# Patient Record
Sex: Male | Born: 1965 | Race: White | Hispanic: No | Smoking: Former smoker
Health system: Southern US, Community
[De-identification: ages and names within clinical notes are randomized; demographics above are authoritative.]

## PROBLEM LIST (undated history)

## (undated) DIAGNOSIS — K7031 Alcoholic cirrhosis of liver with ascites: Secondary | ICD-10-CM

## (undated) DIAGNOSIS — E1140 Type 2 diabetes mellitus with diabetic neuropathy, unspecified: Secondary | ICD-10-CM

## (undated) DIAGNOSIS — K219 Gastro-esophageal reflux disease without esophagitis: Secondary | ICD-10-CM

## (undated) DIAGNOSIS — F1910 Other psychoactive substance abuse, uncomplicated: Secondary | ICD-10-CM

## (undated) DIAGNOSIS — F329 Major depressive disorder, single episode, unspecified: Secondary | ICD-10-CM

## (undated) DIAGNOSIS — K7030 Alcoholic cirrhosis of liver without ascites: Secondary | ICD-10-CM

## (undated) DIAGNOSIS — K859 Acute pancreatitis without necrosis or infection, unspecified: Secondary | ICD-10-CM

## (undated) DIAGNOSIS — F419 Anxiety disorder, unspecified: Secondary | ICD-10-CM

## (undated) DIAGNOSIS — C182 Malignant neoplasm of ascending colon: Secondary | ICD-10-CM

## (undated) DIAGNOSIS — E119 Type 2 diabetes mellitus without complications: Secondary | ICD-10-CM

## (undated) DIAGNOSIS — M109 Gout, unspecified: Secondary | ICD-10-CM

## (undated) DIAGNOSIS — E785 Hyperlipidemia, unspecified: Secondary | ICD-10-CM

## (undated) DIAGNOSIS — I10 Essential (primary) hypertension: Secondary | ICD-10-CM

## (undated) DIAGNOSIS — F32A Depression, unspecified: Secondary | ICD-10-CM

## (undated) HISTORY — DX: Gout, unspecified: M10.9

## (undated) HISTORY — DX: Anxiety disorder, unspecified: F41.9

## (undated) HISTORY — DX: Type 2 diabetes mellitus with diabetic neuropathy, unspecified: E11.40

## (undated) HISTORY — DX: Essential (primary) hypertension: I10

## (undated) HISTORY — DX: Type 2 diabetes mellitus without complications: E11.9

## (undated) HISTORY — PX: INGUINAL HERNIA REPAIR: SUR1180

## (undated) HISTORY — PX: HERNIA REPAIR: SHX51

## (undated) HISTORY — DX: Alcoholic cirrhosis of liver with ascites: K70.31

## (undated) HISTORY — DX: Hyperlipidemia, unspecified: E78.5

## (undated) HISTORY — DX: Gastro-esophageal reflux disease without esophagitis: K21.9

## (undated) HISTORY — DX: Malignant neoplasm of ascending colon: C18.2

## (undated) HISTORY — DX: Alcoholic cirrhosis of liver without ascites: K70.30

## (undated) HISTORY — DX: Major depressive disorder, single episode, unspecified: F32.9

## (undated) HISTORY — DX: Depression, unspecified: F32.A

## (undated) HISTORY — DX: Other psychoactive substance abuse, uncomplicated: F19.10

---

## 2005-03-18 ENCOUNTER — Ambulatory Visit: Payer: Self-pay | Admitting: Family Medicine

## 2006-07-13 ENCOUNTER — Ambulatory Visit: Payer: Self-pay | Admitting: Family Medicine

## 2007-05-20 ENCOUNTER — Ambulatory Visit: Payer: Self-pay | Admitting: Family Medicine

## 2007-05-20 DIAGNOSIS — F418 Other specified anxiety disorders: Secondary | ICD-10-CM

## 2007-05-20 DIAGNOSIS — K219 Gastro-esophageal reflux disease without esophagitis: Secondary | ICD-10-CM

## 2007-05-20 DIAGNOSIS — E785 Hyperlipidemia, unspecified: Secondary | ICD-10-CM

## 2007-05-20 DIAGNOSIS — I10 Essential (primary) hypertension: Secondary | ICD-10-CM | POA: Insufficient documentation

## 2008-07-17 ENCOUNTER — Ambulatory Visit: Payer: Self-pay | Admitting: Family Medicine

## 2008-07-23 LAB — CONVERTED CEMR LAB
ALT: 38 units/L (ref 0–53)
Albumin: 4 g/dL (ref 3.5–5.2)
BUN: 12 mg/dL (ref 6–23)
Basophils Absolute: 0 10*3/uL (ref 0.0–0.1)
Basophils Relative: 0.7 % (ref 0.0–3.0)
CO2: 28 meq/L (ref 19–32)
Calcium: 9.4 mg/dL (ref 8.4–10.5)
Creatinine, Ser: 1.1 mg/dL (ref 0.4–1.5)
Direct LDL: 109.2 mg/dL
Eosinophils Absolute: 0.1 10*3/uL (ref 0.0–0.7)
Hemoglobin: 16.5 g/dL (ref 13.0–17.0)
MCHC: 34.9 g/dL (ref 30.0–36.0)
MCV: 91.7 fL (ref 78.0–100.0)
Neutro Abs: 3 10*3/uL (ref 1.4–7.7)
RBC: 5.15 M/uL (ref 4.22–5.81)
TSH: 1.55 microintl units/mL (ref 0.35–5.50)
Total Bilirubin: 1 mg/dL (ref 0.3–1.2)

## 2009-07-09 ENCOUNTER — Ambulatory Visit: Payer: Self-pay | Admitting: Family Medicine

## 2009-07-12 ENCOUNTER — Ambulatory Visit: Payer: Self-pay | Admitting: Family Medicine

## 2009-07-16 ENCOUNTER — Ambulatory Visit: Payer: Self-pay | Admitting: Family Medicine

## 2009-07-30 ENCOUNTER — Ambulatory Visit: Payer: Self-pay | Admitting: Family Medicine

## 2009-08-26 ENCOUNTER — Telehealth: Payer: Self-pay | Admitting: Family Medicine

## 2010-05-14 ENCOUNTER — Telehealth: Payer: Self-pay | Admitting: Family Medicine

## 2010-07-15 NOTE — Assessment & Plan Note (Signed)
Summary: 2 week fup//ccm   Vital Signs:  Patient profile:   45 year old male Temp:     98.1 degrees F oral BP sitting:   132 / 88  (left arm) Cuff size:   large  Vitals Entered By: Alfred Levins, CMA (July 30, 2009 1:16 PM) CC: f/u   History of Present Illness: Here to follow up on anxiety and depression. he is doing very well now, and he is ready to return to work on 08-05-09 as we had planned. he is talking with his HR department, and they are satisfied. He has been meeting with a therapist as well. he would like to go back down on the dose of Zoloft since it makes him feel a little "like a zombie".   Current Medications (verified): 1)  Bisoprolol-Hydrochlorothiazide 2.5-6.25 Mg  Tabs (Bisoprolol-Hydrochlorothiazide) .... Once Daily 2)  Zoloft 100 Mg Tabs (Sertraline Hcl) .... Once Daily 3)  Alprazolam 1 Mg Tabs (Alprazolam) .... Three Times A Day As Needed Anxiety  Allergies (verified): No Known Drug Allergies  Past History:  Past Medical History: Reviewed history from 05/20/2007 and no changes required. Depression Hyperlipidemia Hypertension GERD  Review of Systems  The patient denies anorexia, fever, weight loss, weight gain, vision loss, decreased hearing, hoarseness, chest pain, syncope, dyspnea on exertion, peripheral edema, prolonged cough, headaches, hemoptysis, abdominal pain, melena, hematochezia, severe indigestion/heartburn, hematuria, incontinence, genital sores, muscle weakness, suspicious skin lesions, transient blindness, difficulty walking, depression, unusual weight change, abnormal bleeding, enlarged lymph nodes, angioedema, breast masses, and testicular masses.    Physical Exam  General:  Well-developed,well-nourished,in no acute distress; alert,appropriate and cooperative throughout examination Psych:  Cognition and judgment appear intact. Alert and cooperative with normal attention span and concentration. No apparent delusions, illusions,  hallucinations   Impression & Recommendations:  Problem # 1:  ANXIETY STATE, UNSPECIFIED (ICD-300.00)  His updated medication list for this problem includes:    Zoloft 100 Mg Tabs (Sertraline hcl) ..... Once daily    Alprazolam 1 Mg Tabs (Alprazolam) .Marland Kitchen... Three times a day as needed anxiety  Problem # 2:  DEPRESSION (ICD-311)  His updated medication list for this problem includes:    Zoloft 100 Mg Tabs (Sertraline hcl) ..... Once daily    Alprazolam 1 Mg Tabs (Alprazolam) .Marland Kitchen... Three times a day as needed anxiety  Complete Medication List: 1)  Bisoprolol-hydrochlorothiazide 2.5-6.25 Mg Tabs (Bisoprolol-hydrochlorothiazide) .... Once daily 2)  Zoloft 100 Mg Tabs (Sertraline hcl) .... Once daily 3)  Alprazolam 1 Mg Tabs (Alprazolam) .... Three times a day as needed anxiety  Patient Instructions: 1)  Decrease Zoloft back to 50mg  a day. return to work as above.  2)  Please schedule a follow-up appointment in 3 months .  Prescriptions: BISOPROLOL-HYDROCHLOROTHIAZIDE 2.5-6.25 MG  TABS (BISOPROLOL-HYDROCHLOROTHIAZIDE) once daily  #30 x 11   Entered and Authorized by:   Nelwyn Salisbury MD   Signed by:   Nelwyn Salisbury MD on 07/30/2009   Method used:   Electronically to        [id number] (retail)       [address]       [place], [place]  [address]       Ph: [phone] or [phone]       Fax: [phone]   RxID:   [id number]

## 2010-07-15 NOTE — Progress Notes (Signed)
Summary: rx alprazolam  Phone Note From Pharmacy   Caller: [id number] Summary of Call: refill alprazolam 1 mg  Initial call taken by: Pura Spice, RN,  May 14, 2010 5:15 PM  Follow-up for Phone Call        call in #60 with 5 rf Follow-up by: Nelwyn Salisbury MD,  May 15, 2010 9:45 AM  Additional Follow-up for Phone Call Additional follow up Details #1::        done Additional Follow-up by: Pura Spice, RN,  May 15, 2010 10:27 AM    Prescriptions: ALPRAZOLAM 1 MG TABS (ALPRAZOLAM) three times a day as needed anxiety  #60 x 6   Entered by:   Pura Spice, RN   Authorized by:   Nelwyn Salisbury MD   Signed by:   Pura Spice, RN on 05/15/2010   Method used:   Telephoned to ...       [id number] (retail)       [address]       [place], [place]  [address]       Ph: [phone] or [phone]       Fax: [phone]   RxID:   [id number]

## 2010-07-15 NOTE — Progress Notes (Signed)
Summary: refill Alprazolam  Phone Note From Pharmacy Message from:  Pharmacy on August 26, 2009 11:36 AM  Refills Requested: Medication #1:  ALPRAZOLAM 1 MG TABS three times a day as needed anxiety.   Dosage confirmed as above?Dosage Confirmed   Supply Requested: 3 months   Last Refilled: 07/09/2009  Method Requested: Telephone to Pharmacy Caller: [id number] Summary of Call: call in #60 with 5 rf Initial call taken by: Nelwyn Salisbury MD,  August 26, 2009 2:48 PM    Prescriptions: ALPRAZOLAM 1 MG TABS (ALPRAZOLAM) three times a day as needed anxiety  #60 x 5   Entered by:   Raechel Ache, RN   Authorized by:   Nelwyn Salisbury MD   Signed by:   Raechel Ache, RN on 08/26/2009   Method used:   Historical   RxID:   [id number]

## 2010-07-15 NOTE — Assessment & Plan Note (Signed)
Summary: med check/depression/cjr   Vital Signs:  Patient profile:   45 year old male Height:      75 inches Weight:      215 pounds BMI:     26.97 Temp:     98.1 degrees F oral Pulse rate:   103 / minute BP sitting:   154 / 94  (left arm) Cuff size:   large  Vitals Entered By: Alfred Levins, CMA (July 09, 2009 1:24 PM) CC: depression   History of Present Illness: Here asking for help with a very difficult situation. After his divorce last year, he developed a relationship with a girl he met through work in [place], and they became engaged. Then he learned last week that she has been having an affair with Tayvon's immediate boss here in [place]. It happens that this man is also a good friend of Boden's. Of course he has been very upset over this situation, but one bad part of it is he does not want to go back to work to be in the same office as his boss. he has been out of work at home ever since. he is depressed, angry, anxious, etc. His appetite is down but he is eating a little food. It is hard to sleep. he has had some brief thoughts about violence towards this boss, but he has no plans for this and wants to stay away from the office for awhile to let him cool down first. he admis to some brief suicidal thoughts also, but says he has no plans for this and does not want to harm himself or anyone else. He needs me to put him out of work for awhile until he can sort this all out.   Current Medications (verified): 1)  Bisoprolol-Hydrochlorothiazide 2.5-6.25 Mg  Tabs (Bisoprolol-Hydrochlorothiazide) .... Once Daily 2)  Sertraline Hcl 50 Mg Tabs (Sertraline Hcl) .... Once Daily  Allergies (verified): No Known Drug Allergies  Past History:  Past Medical History: Reviewed history from 05/20/2007 and no changes required. Depression Hyperlipidemia Hypertension GERD  Social History: Reviewed history from 05/20/2007 and no changes required. Former Smoker Alcohol use-yes Divorced,  has visitation rights with his 73 yr old son Occupation: in Nurse, learning disability for [employer] Occupation:  employed  Review of Systems  The patient denies anorexia, fever, weight loss, weight gain, vision loss, decreased hearing, hoarseness, chest pain, syncope, dyspnea on exertion, peripheral edema, prolonged cough, headaches, hemoptysis, abdominal pain, melena, hematochezia, severe indigestion/heartburn, hematuria, incontinence, genital sores, muscle weakness, suspicious skin lesions, transient blindness, difficulty walking, unusual weight change, abnormal bleeding, enlarged lymph nodes, angioedema, breast masses, and testicular masses.    Physical Exam  General:  Well-developed,well-nourished,in no acute distress; alert,appropriate and cooperative throughout examination Psych:  Oriented X3, memory intact for recent and remote, normally interactive, good eye contact, tearful, and moderately anxious.     Impression & Recommendations:  Problem # 1:  ANXIETY STATE, UNSPECIFIED (ICD-300.00)  The following medications were removed from the medication list:    Sertraline Hcl 50 Mg Tabs (Sertraline hcl) ..... Once daily His updated medication list for this problem includes:    Zoloft 100 Mg Tabs (Sertraline hcl) ..... Once daily    Alprazolam 1 Mg Tabs (Alprazolam) .Marland Kitchen... Three times a day as needed anxiety  Problem # 2:  DEPRESSION (ICD-311)  The following medications were removed from the medication list:    Sertraline Hcl 50 Mg Tabs (Sertraline hcl) ..... Once daily His updated medication list for this  problem includes:    Zoloft 100 Mg Tabs (Sertraline hcl) ..... Once daily    Alprazolam 1 Mg Tabs (Alprazolam) .Marland Kitchen... Three times a day as needed anxiety  Complete Medication List: 1)  Bisoprolol-hydrochlorothiazide 2.5-6.25 Mg Tabs (Bisoprolol-hydrochlorothiazide) .... Once daily 2)  Zoloft 100 Mg Tabs (Sertraline hcl) .... Once daily 3)  Alprazolam 1 Mg Tabs (Alprazolam)  .... Three times a day as needed anxiety  Patient Instructions: 1)  First we will put him out of work for one month to start with. His last day at work was 06-28-09, so we will put him out from 07-01-09 until 08-05-09. We will increase his Zoloft to 100mg  a day and add Xanax that he can use as needed . Will refer him to Judithe Modest MSW for psychotherapy. I will see him back in one week. He knows to ask for help or call 911 if he gets any more sustained violent thoughts. His cell # is [phone]. Prescriptions: ALPRAZOLAM 1 MG TABS (ALPRAZOLAM) three times a day as needed anxiety  #60 x 0   Entered and Authorized by:   Nelwyn Salisbury MD   Signed by:   Nelwyn Salisbury MD on 07/09/2009   Method used:   Print then Give to Patient   RxID:   [id number] ZOLOFT 100 MG TABS (SERTRALINE HCL) once daily  #30 x 5   Entered and Authorized by:   Nelwyn Salisbury MD   Signed by:   Nelwyn Salisbury MD on 07/09/2009   Method used:   Print then Give to Patient   RxID:   [id number]

## 2010-07-15 NOTE — Assessment & Plan Note (Signed)
Summary: 1 week fup//ccm   Vital Signs:  Patient profile:   45 year old male Weight:      208 pounds Temp:     98.3 degrees F oral Pulse rate:   77 / minute BP sitting:   134 / 82  (left arm) Cuff size:   large  Vitals Entered By: Alfred Levins, CMA (July 16, 2009 1:23 PM) CC: 1 wk f/u   History of Present Illness: Here to follow up situational anxiety and depression from one week ago. He has been out of work per our plan, and he is taking the medications. He feels much better than before, less tearfulness, less hopelessness. He is more relaxed and has been able to sleep fairly well by using a Xanax at bedtime . He has not been able to start therapy yet but is still interested. He no longer has any suicidal or homocidal thoughts at all.   Current Medications (verified): 1)  Bisoprolol-Hydrochlorothiazide 2.5-6.25 Mg  Tabs (Bisoprolol-Hydrochlorothiazide) .... Once Daily 2)  Zoloft 100 Mg Tabs (Sertraline Hcl) .... Once Daily 3)  Alprazolam 1 Mg Tabs (Alprazolam) .... Three Times A Day As Needed Anxiety  Allergies (verified): No Known Drug Allergies  Past History:  Past Medical History: Reviewed history from 05/20/2007 and no changes required. Depression Hyperlipidemia Hypertension GERD  Review of Systems  The patient denies anorexia, fever, weight loss, weight gain, vision loss, decreased hearing, hoarseness, chest pain, syncope, dyspnea on exertion, peripheral edema, prolonged cough, headaches, hemoptysis, abdominal pain, melena, hematochezia, severe indigestion/heartburn, hematuria, incontinence, genital sores, muscle weakness, suspicious skin lesions, transient blindness, difficulty walking, unusual weight change, abnormal bleeding, enlarged lymph nodes, angioedema, breast masses, and testicular masses.    Physical Exam  General:  Well-developed,well-nourished,in no acute distress; alert,appropriate and cooperative throughout examination Psych:  Oriented X3, memory  intact for recent and remote, normally interactive, good eye contact, and slightly anxious.  He appears to be much better than last time   Impression & Recommendations:  Problem # 1:  ANXIETY STATE, UNSPECIFIED (ICD-300.00)  His updated medication list for this problem includes:    Zoloft 100 Mg Tabs (Sertraline hcl) ..... Once daily    Alprazolam 1 Mg Tabs (Alprazolam) .Marland Kitchen... Three times a day as needed anxiety  Problem # 2:  DEPRESSION (ICD-311)  His updated medication list for this problem includes:    Zoloft 100 Mg Tabs (Sertraline hcl) ..... Once daily    Alprazolam 1 Mg Tabs (Alprazolam) .Marland Kitchen... Three times a day as needed anxiety  Complete Medication List: 1)  Bisoprolol-hydrochlorothiazide 2.5-6.25 Mg Tabs (Bisoprolol-hydrochlorothiazide) .... Once daily 2)  Zoloft 100 Mg Tabs (Sertraline hcl) .... Once daily 3)  Alprazolam 1 Mg Tabs (Alprazolam) .... Three times a day as needed anxiety  Patient Instructions: 1)  He will continue the current regimen. Hopefully he can begin therapy soon. we still have 08-05-09 as a target date to return to work. I spent 30 minutes today  discussing these issues, all of which was face to face.

## 2010-08-11 ENCOUNTER — Encounter: Payer: Self-pay | Admitting: Family Medicine

## 2010-08-11 ENCOUNTER — Ambulatory Visit (INDEPENDENT_AMBULATORY_CARE_PROVIDER_SITE_OTHER): Payer: BC Managed Care – PPO | Admitting: Family Medicine

## 2010-08-11 DIAGNOSIS — H00019 Hordeolum externum unspecified eye, unspecified eyelid: Secondary | ICD-10-CM

## 2010-08-11 DIAGNOSIS — K4090 Unilateral inguinal hernia, without obstruction or gangrene, not specified as recurrent: Secondary | ICD-10-CM

## 2010-08-11 MED ORDER — TOBRAMYCIN 0.3 % OP SOLN: 2.0000 [drp] | OPHTHALMIC | Status: AC

## 2010-08-11 NOTE — Progress Notes (Signed)
  Subjective:    Patient ID: Martin Anthony, male    DOB: 1965-10-08, 45 y.o.   MRN: [id number]  HPI Here for one week of a tender bump on the left lower eyelid. Also for several months he has had a painful lump in the right groin area. No recent trauma.    Review of Systems  Constitutional: Negative.   Gastrointestinal: Negative.        Objective:   Physical Exam  Constitutional: He appears well-developed and well-nourished.  HENT:  Head: Normocephalic and atraumatic.  Right Ear: External ear normal.  Left Ear: External ear normal.  Nose: Nose normal.  Mouth/Throat: No oropharyngeal exudate.  Eyes: Conjunctivae are normal. Pupils are equal, round, and reactive to light.       Left lower eyelid is swollen and tender   Abdominal: Soft. Bowel sounds are normal. He exhibits mass. He exhibits no distension. There is tenderness. There is no rebound and no guarding.       The right groin has a tender small reducible direct inguinal hernia          Assessment & Plan:  Refer to Surgery. Treat the stye with drops.

## 2010-08-12 ENCOUNTER — Other Ambulatory Visit: Payer: Self-pay | Admitting: Family Medicine

## 2010-08-28 ENCOUNTER — Ambulatory Visit (HOSPITAL_COMMUNITY)
Admission: RE | Admit: 2010-08-28 | Discharge: 2010-08-28 | Disposition: A | Discharge status: Home / Self Care | Payer: BC Managed Care – PPO | Source: Ambulatory Visit | Attending: Surgery | Admitting: Surgery

## 2010-08-28 ENCOUNTER — Other Ambulatory Visit (HOSPITAL_COMMUNITY): Payer: Self-pay | Admitting: Surgery

## 2010-08-28 ENCOUNTER — Encounter (HOSPITAL_COMMUNITY)
Admission: RE | Admit: 2010-08-28 | Discharge: 2010-08-28 | Disposition: A | Discharge status: Home / Self Care | Payer: BC Managed Care – PPO | Source: Ambulatory Visit | Attending: Surgery | Admitting: Surgery

## 2010-08-28 DIAGNOSIS — Z01811 Encounter for preprocedural respiratory examination: Secondary | ICD-10-CM

## 2010-08-28 DIAGNOSIS — Z01812 Encounter for preprocedural laboratory examination: Secondary | ICD-10-CM | POA: Insufficient documentation

## 2010-08-28 DIAGNOSIS — Z01810 Encounter for preprocedural cardiovascular examination: Secondary | ICD-10-CM | POA: Insufficient documentation

## 2010-08-28 DIAGNOSIS — Z01818 Encounter for other preprocedural examination: Secondary | ICD-10-CM | POA: Insufficient documentation

## 2010-08-28 LAB — CBC
HCT: 43.4 % (ref 39.0–52.0)
Hemoglobin: 16 g/dL (ref 13.0–17.0)
MCH: 32.2 pg (ref 26.0–34.0)
MCHC: 36.9 g/dL — ABNORMAL HIGH (ref 30.0–36.0)
MCV: 87.3 fL (ref 78.0–100.0)
Platelets: 236 K/uL (ref 150–400)
RBC: 4.97 MIL/uL (ref 4.22–5.81)
RDW: 12.3 % (ref 11.5–15.5)
WBC: 5.3 K/uL (ref 4.0–10.5)

## 2010-08-28 LAB — BASIC METABOLIC PANEL
CO2: 30 mEq/L (ref 19–32)
Calcium: 9.7 mg/dL (ref 8.4–10.5)
Chloride: 101 mEq/L (ref 96–112)
Creatinine, Ser: 1.29 mg/dL (ref 0.4–1.5)
Glucose, Bld: 116 mg/dL — ABNORMAL HIGH (ref 70–99)

## 2010-08-28 LAB — SURGICAL PCR SCREEN: MRSA, PCR: NEGATIVE

## 2010-08-29 ENCOUNTER — Ambulatory Visit (HOSPITAL_COMMUNITY)
Admission: RE | Admit: 2010-08-29 | Discharge: 2010-08-29 | Disposition: A | Discharge status: Home / Self Care | Payer: BC Managed Care – PPO | Source: Ambulatory Visit | Attending: Surgery | Admitting: Surgery

## 2010-08-29 DIAGNOSIS — K4090 Unilateral inguinal hernia, without obstruction or gangrene, not specified as recurrent: Secondary | ICD-10-CM | POA: Insufficient documentation

## 2010-08-29 DIAGNOSIS — Z01812 Encounter for preprocedural laboratory examination: Secondary | ICD-10-CM | POA: Insufficient documentation

## 2010-08-29 DIAGNOSIS — K429 Umbilical hernia without obstruction or gangrene: Secondary | ICD-10-CM | POA: Insufficient documentation

## 2010-08-29 DIAGNOSIS — Z01810 Encounter for preprocedural cardiovascular examination: Secondary | ICD-10-CM | POA: Insufficient documentation

## 2010-08-29 DIAGNOSIS — Z01818 Encounter for other preprocedural examination: Secondary | ICD-10-CM | POA: Insufficient documentation

## 2010-09-01 ENCOUNTER — Encounter: Payer: Self-pay | Admitting: Family Medicine

## 2010-09-06 ENCOUNTER — Other Ambulatory Visit: Payer: Self-pay | Admitting: Family Medicine

## 2010-09-09 ENCOUNTER — Other Ambulatory Visit (INDEPENDENT_AMBULATORY_CARE_PROVIDER_SITE_OTHER): Payer: BC Managed Care – PPO | Admitting: Family Medicine

## 2010-09-09 DIAGNOSIS — Z0000 Encounter for general adult medical examination without abnormal findings: Secondary | ICD-10-CM

## 2010-09-09 DIAGNOSIS — E785 Hyperlipidemia, unspecified: Secondary | ICD-10-CM

## 2010-09-09 LAB — CBC WITH DIFFERENTIAL/PLATELET
Basophils Absolute: 0 10*3/uL (ref 0.0–0.1)
Basophils Relative: 0.5 % (ref 0.0–3.0)
Eosinophils Absolute: 0.1 10*3/uL (ref 0.0–0.7)
Hemoglobin: 15.7 g/dL (ref 13.0–17.0)
Lymphocytes Relative: 31.4 % (ref 12.0–46.0)
MCHC: 35.3 g/dL (ref 30.0–36.0)
MCV: 93 fl (ref 78.0–100.0)
Monocytes Absolute: 0.4 10*3/uL (ref 0.1–1.0)
Neutro Abs: 3.6 10*3/uL (ref 1.4–7.7)
Neutrophils Relative %: 60 % (ref 43.0–77.0)
RBC: 4.8 Mil/uL (ref 4.22–5.81)
RDW: 12.7 % (ref 11.5–14.6)

## 2010-09-09 LAB — LIPID PANEL
HDL: 44.7 mg/dL (ref >39.00)
Triglycerides: 796 mg/dL — ABNORMAL HIGH (ref 0.0–149.0)
VLDL: 159.2 mg/dL — ABNORMAL HIGH (ref 0.0–40.0)

## 2010-09-09 LAB — HEPATIC FUNCTION PANEL
Albumin: 4.2 g/dL (ref 3.5–5.2)
Total Protein: 7.2 g/dL (ref 6.0–8.3)

## 2010-09-09 LAB — POCT URINALYSIS DIPSTICK
Blood, UA: NEGATIVE
Ketones, UA: NEGATIVE
Protein, UA: NEGATIVE
Spec Grav, UA: 1.02
Urobilinogen, UA: 0.2
pH, UA: 5

## 2010-09-09 LAB — BASIC METABOLIC PANEL
CO2: 28 mEq/L (ref 19–32)
Calcium: 9.4 mg/dL (ref 8.4–10.5)
Chloride: 108 mEq/L (ref 96–112)
Creatinine, Ser: 1.2 mg/dL (ref 0.4–1.5)
Glucose, Bld: 105 mg/dL — ABNORMAL HIGH (ref 70–99)

## 2010-09-11 NOTE — Op Note (Signed)
NAMENICHOLI, GHUMAN NO.:  [id number]  MEDICAL RECORD NO.:  [id number]           PATIENT TYPE:  O  LOCATION:  XRAY                         FACILITY:  [facility]  PHYSICIAN:  Abigail Miyamoto, M.D. DATE OF BIRTH:  May 29, 1966  DATE OF PROCEDURE:  08/29/2010 DATE OF DISCHARGE:  08/28/2010                              OPERATIVE REPORT   PREOPERATIVE DIAGNOSES:  Right inguinal hernia and umbilical hernia.  POSTOPERATIVE DIAGNOSES:  Right inguinal hernia and umbilical hernia.  PROCEDURE:  Laparoscopic right inguinal repair with mesh, umbilical hernia repair.  SURGEON:  Abigail Miyamoto, M.D.  ANESTHESIA:  General and 0.5% Marcaine.  ESTIMATED BLOOD LOSS:  Minimal.  FINDINGS:  The patient was found to have a very small indirect as well as small direct hernia on the right.  There was also a small, chronically incarcerated umbilical hernia which was approximately 4-5 mm in size.  PROCEDURE IN DETAIL:  The patient was brought to the operating room, identified as Cresenciano Lick.  He was placed supine on the operating table, and general anesthesia was induced.  A Foley catheter was inserted.  His abdomen was then prepped and draped in usual sterile fashion.  Using a #15 blade, a small transverse incision was made at the lower edge of the umbilicus.  This was carried down to the fascia which was then opened with a scalpel.  The rectus muscles were identified and elevated.  The dissecting balloon was then passed underneath the rectus muscle and manipulated towards the pubis.  The dissecting balloon was then insufflated under direct vision, dissecting out the preperitoneal space. The dissecting balloon was then removed, and insufflation was begun with carbon dioxide.  I then placed two 5-mm ports in the patient's lower midline under direct vision.  Dissection was then carried out in the right inguinal area.  The testicular cord and its structures were easily identified.   There was a very small hernia sac in the cord structures which was easily able to be reduced.  The patient also had a small to moderate size direct hernia.  Cooper's ligament was identified.  All the contents had already been reduced out of both hernias.  At this point, a 6-inch x 6-inch piece of UltraPro mesh was brought into the field.  I fashioned the mesh appropriately.  I then placed it through the incision at the umbilicus and opened it in an onlay fashion on the right inguinal floor.  Using the absorbable tacker, I tacked the mesh to Cooper's ligament up the medial abdominal wall and out laterally.  Good coverage of the hernia defects and testicular cord structures appeared to be achieved.  Hemostasis also appeared to be achieved.  I briefly visualized the left inguinal area, and his previous repair appeared intact.  I then removed the midline ports under direct vision, and the preperitoneal space was seen to collapse appropriately with the mesh in place.  I then removed the trocar at the umbilicus.  Next, I separated the umbilical skin from the hernia sac.  The patient had incarcerated omentum in the hernia sac.  I excised this at  its base. The actual fascial defect was only about 4-5 mm in size.  I closed this with a figure-of-eight zero Novofil suture.  I then closed the other fascial incision with a figure-of-eight 0 Vicryl suture.  I then anesthetized all wounds with Marcaine and performed an ilioinguinal nerve block with the Marcaine as well.  I then closed the umbilical incision with 3-0 Vicryl sutures and a running 4-0 Monocryl.  I closed the other two incisions with 4-0 Monocryl.  Steri-Strips and Band-Aids were then applied.  The patient tolerated the procedure well. All sponge, needle, and instrument counts were correct at the end of the procedure.  The patient was then extubated in the operating room and taken in stable condition to recovery room.     Abigail Miyamoto, M.D.     DB/MEDQ  D:  08/29/2010  T:  08/30/2010  Job:  [id number]  Electronically Signed by Abigail Miyamoto M.D. on 09/11/2010 09:33:52 AM

## 2010-09-12 ENCOUNTER — Telehealth: Payer: Self-pay

## 2010-09-12 NOTE — Telephone Encounter (Signed)
Message copied by Madison Hickman on Fri Sep 12, 2010  9:11 AM ------      Message from: Dwaine Deter      Created: Thu Sep 11, 2010  8:46 AM       Normal except mildly high glucose and chol, with an extremely high TG. Watch the diet, and we will discuss meds at the cpx

## 2010-09-12 NOTE — Telephone Encounter (Signed)
Mess left and requested pt to return call regarding appt  And his labs

## 2010-09-15 ENCOUNTER — Telehealth: Payer: Self-pay

## 2010-09-15 NOTE — Telephone Encounter (Signed)
Message copied by Madison Hickman on Mon Sep 15, 2010  9:58 AM ------      Message from: Dwaine Deter      Created: Thu Sep 11, 2010  8:46 AM       Normal except mildly high glucose and chol, with an extremely high TG. Watch the diet, and we will discuss meds at the cpx

## 2010-09-15 NOTE — Telephone Encounter (Signed)
Opened in error

## 2010-09-15 NOTE — Telephone Encounter (Signed)
Have been unable to reach via phone . Letter mailed.

## 2010-09-15 NOTE — Telephone Encounter (Signed)
Called pt left mess and requested him to call and sch appt with dr fry

## 2010-09-15 NOTE — Telephone Encounter (Signed)
Letter with lab results mailed to pt requesting he call and sch appt with dr Clent Ridges.

## 2010-09-16 ENCOUNTER — Encounter: Payer: BC Managed Care – PPO | Admitting: Family Medicine

## 2010-12-10 ENCOUNTER — Other Ambulatory Visit: Payer: Self-pay | Admitting: Family Medicine

## 2011-03-18 ENCOUNTER — Other Ambulatory Visit: Payer: Self-pay

## 2011-03-18 NOTE — Telephone Encounter (Signed)
Rx request for sertraline hcl 50 #30. Pt last seen 08/11/10. Pls advise.

## 2011-03-19 MED ORDER — SERTRALINE HCL 50 MG PO TABS: 50.0000 mg | ORAL_TABLET | Freq: Every day | ORAL | Status: DC

## 2011-03-19 NOTE — Telephone Encounter (Signed)
rx has been sent to the pharmacy. 

## 2011-03-19 NOTE — Telephone Encounter (Signed)
Call in a one year supply  

## 2011-05-15 ENCOUNTER — Other Ambulatory Visit: Payer: Self-pay | Admitting: Family Medicine

## 2011-05-18 ENCOUNTER — Other Ambulatory Visit: Payer: Self-pay | Admitting: Family Medicine

## 2011-06-24 ENCOUNTER — Ambulatory Visit (INDEPENDENT_AMBULATORY_CARE_PROVIDER_SITE_OTHER): Payer: BC Managed Care – PPO | Admitting: Family Medicine

## 2011-06-24 ENCOUNTER — Encounter: Payer: Self-pay | Admitting: Family Medicine

## 2011-06-24 VITALS — BP 118/78 | HR 54 | Temp 98.9°F | Wt 235.0 lb

## 2011-06-24 DIAGNOSIS — J329 Chronic sinusitis, unspecified: Secondary | ICD-10-CM

## 2011-06-24 MED ORDER — AMOXICILLIN-POT CLAVULANATE 875-125 MG PO TABS: 1.0000 | ORAL_TABLET | Freq: Two times a day (BID) | ORAL | Status: AC

## 2011-06-24 MED ORDER — BISOPROLOL-HYDROCHLOROTHIAZIDE 2.5-6.25 MG PO TABS: 1.0000 | ORAL_TABLET | Freq: Every day | ORAL | Status: DC

## 2011-06-24 NOTE — Progress Notes (Signed)
  Subjective:    Patient ID: Martin Anthony, male    DOB: 1965/12/12, 46 y.o.   MRN: [id number]  HPI Here for 2 weeks of sinus pressure, ear pain, PND, and ST. No fever.    Review of Systems  Constitutional: Negative.   HENT: Positive for hearing loss, ear pain, congestion and postnasal drip.   Eyes: Negative.   Respiratory: Positive for cough.        Objective:   Physical Exam  Constitutional: He appears well-developed and well-nourished.  HENT:  Right Ear: External ear normal.  Left Ear: External ear normal.  Nose: Nose normal.  Mouth/Throat: Oropharynx is clear and moist.  Eyes: Conjunctivae are normal.  Neck: Neck supple. Thyromegaly present.  Pulmonary/Chest: Effort normal and breath sounds normal.  Lymphadenopathy:    He has no cervical adenopathy.          Assessment & Plan:  Add Mucinex bid

## 2011-06-25 ENCOUNTER — Ambulatory Visit: Payer: BC Managed Care – PPO | Admitting: Family Medicine

## 2011-08-06 ENCOUNTER — Other Ambulatory Visit: Payer: Self-pay | Admitting: Family Medicine

## 2011-09-08 ENCOUNTER — Telehealth: Payer: Self-pay | Admitting: Family Medicine

## 2011-09-08 MED ORDER — BISOPROLOL-HYDROCHLOROTHIAZIDE 2.5-6.25 MG PO TABS: 1.0000 | ORAL_TABLET | Freq: Every day | ORAL | Status: DC

## 2011-09-08 NOTE — Telephone Encounter (Signed)
This is okay.

## 2011-09-08 NOTE — Telephone Encounter (Signed)
Refill request for Bisoprolol-HCTZ 2.5-6.25 mg and a 90 day supply. I sent script e-scribe. Also pt is requesting Sertraline HCL 50 mg and a 90 day supply.

## 2011-09-09 MED ORDER — SERTRALINE HCL 50 MG PO TABS: 50.0000 mg | ORAL_TABLET | Freq: Every day | ORAL | Status: DC

## 2011-09-09 NOTE — Telephone Encounter (Signed)
Rx for 90 day supply of zoloft sent to pharmacy

## 2012-04-16 ENCOUNTER — Other Ambulatory Visit: Payer: Self-pay | Admitting: Family Medicine

## 2012-08-21 ENCOUNTER — Other Ambulatory Visit: Payer: Self-pay | Admitting: Family Medicine

## 2012-12-24 ENCOUNTER — Other Ambulatory Visit: Payer: Self-pay | Admitting: Family Medicine

## 2013-02-07 ENCOUNTER — Other Ambulatory Visit: Payer: Self-pay | Admitting: Family Medicine

## 2013-02-21 ENCOUNTER — Encounter: Payer: Self-pay | Admitting: Family Medicine

## 2013-02-21 ENCOUNTER — Ambulatory Visit (INDEPENDENT_AMBULATORY_CARE_PROVIDER_SITE_OTHER): Payer: BC Managed Care – PPO | Admitting: Family Medicine

## 2013-02-21 VITALS — BP 170/100 | HR 98 | Temp 98.2°F | Wt 241.0 lb

## 2013-02-21 DIAGNOSIS — S43402A Unspecified sprain of left shoulder joint, initial encounter: Secondary | ICD-10-CM

## 2013-02-21 DIAGNOSIS — I10 Essential (primary) hypertension: Secondary | ICD-10-CM

## 2013-02-21 DIAGNOSIS — IMO0002 Reserved for concepts with insufficient information to code with codable children: Secondary | ICD-10-CM

## 2013-02-21 MED ORDER — BISOPROLOL-HYDROCHLOROTHIAZIDE 2.5-6.25 MG PO TABS: 2.0000 | ORAL_TABLET | Freq: Every day | ORAL | Status: DC
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
## 2013-02-22 ENCOUNTER — Other Ambulatory Visit: Payer: Self-pay | Admitting: Family Medicine

## 2013-02-23 NOTE — Telephone Encounter (Signed)
Refill for one year 

## 2013-02-24 ENCOUNTER — Other Ambulatory Visit (INDEPENDENT_AMBULATORY_CARE_PROVIDER_SITE_OTHER): Payer: BC Managed Care – PPO

## 2013-02-24 DIAGNOSIS — Z0000 Encounter for general adult medical examination without abnormal findings: Secondary | ICD-10-CM

## 2013-02-24 DIAGNOSIS — E785 Hyperlipidemia, unspecified: Secondary | ICD-10-CM

## 2013-02-24 LAB — POCT URINALYSIS DIPSTICK
Blood, UA: NEGATIVE
Glucose, UA: NEGATIVE
Ketones, UA: NEGATIVE
Leukocytes, UA: NEGATIVE
Nitrite, UA: NEGATIVE

## 2013-02-24 LAB — BASIC METABOLIC PANEL
BUN: 12 mg/dL (ref 6–23)
CO2: 26 mEq/L (ref 19–32)
Chloride: 104 mEq/L (ref 96–112)
Glucose, Bld: 105 mg/dL — ABNORMAL HIGH (ref 70–99)
Potassium: 3.9 mEq/L (ref 3.5–5.1)

## 2013-02-24 LAB — CBC WITH DIFFERENTIAL/PLATELET
Eosinophils Absolute: 0.1 10*3/uL (ref 0.0–0.7)
Eosinophils Relative: 2.1 % (ref 0.0–5.0)
HCT: 46.3 % (ref 39.0–52.0)
Lymphs Abs: 1.7 10*3/uL (ref 0.7–4.0)
MCHC: 35 g/dL (ref 30.0–36.0)
MCV: 92.4 fl (ref 78.0–100.0)
Monocytes Absolute: 0.3 10*3/uL (ref 0.1–1.0)
Platelets: 214 10*3/uL (ref 150.0–400.0)
RDW: 13.3 % (ref 11.5–14.6)
WBC: 4.8 10*3/uL (ref 4.5–10.5)

## 2013-02-24 LAB — LIPID PANEL
Cholesterol: 291 mg/dL — ABNORMAL HIGH (ref 0–200)
Total CHOL/HDL Ratio: 7

## 2013-02-24 LAB — HEPATIC FUNCTION PANEL
Bilirubin, Direct: 0 mg/dL (ref 0.0–0.3)
Total Bilirubin: 0.8 mg/dL (ref 0.3–1.2)

## 2013-02-28 NOTE — Progress Notes (Signed)
Quick Note:  Pt has appointment on 03/22/13 will go over then. ______

## 2013-03-06 ENCOUNTER — Other Ambulatory Visit: Payer: Self-pay | Admitting: Orthopedic Surgery

## 2013-03-06 DIAGNOSIS — M25512 Pain in left shoulder: Secondary | ICD-10-CM

## 2013-03-12 ENCOUNTER — Other Ambulatory Visit: Payer: Self-pay | Admitting: Family Medicine

## 2013-03-13 NOTE — Telephone Encounter (Signed)
Pt states he never picked up this rx bisoprolol-hydrochlorothiazide (ZIAC) 2.5-6.25 MG per tablet on 9/9 and now [facility] is saying they never received this RX. Can you call in for pt? He is out of his med and bp is 180/115!

## 2013-03-14 ENCOUNTER — Other Ambulatory Visit: Payer: Self-pay | Admitting: Family Medicine

## 2013-03-14 NOTE — Telephone Encounter (Signed)
Rx sent to pharmacy   

## 2013-03-14 NOTE — Telephone Encounter (Signed)
Pt wife states that he is completely out of his blood pressure medication, and that it has been running very high for the past week. She would like this RX to be taken care of asap. Please assist.

## 2013-03-22 ENCOUNTER — Ambulatory Visit (INDEPENDENT_AMBULATORY_CARE_PROVIDER_SITE_OTHER): Payer: BC Managed Care – PPO | Admitting: Family Medicine

## 2013-03-22 ENCOUNTER — Encounter: Payer: Self-pay | Admitting: Family Medicine

## 2013-03-22 VITALS — BP 160/90 | HR 68 | Temp 99.0°F | Ht 73.0 in | Wt 234.0 lb

## 2013-03-22 DIAGNOSIS — Z0000 Encounter for general adult medical examination without abnormal findings: Secondary | ICD-10-CM

## 2013-03-22 MED ORDER — ALPRAZOLAM 1 MG PO TABS: 1.0000 mg | ORAL_TABLET | Freq: Three times a day (TID) | ORAL | Status: DC | PRN

## 2013-03-22 MED ORDER — SERTRALINE HCL 50 MG PO TABS: 50.0000 mg | ORAL_TABLET | Freq: Every day | ORAL | Status: DC

## 2013-03-22 MED ORDER — BISOPROLOL-HYDROCHLOROTHIAZIDE 10-6.25 MG PO TABS: 1.0000 | ORAL_TABLET | Freq: Every day | ORAL | Status: DC

## 2013-03-22 MED ORDER — FENOFIBRATE 145 MG PO TABS: 145.0000 mg | ORAL_TABLET | Freq: Every day | ORAL | Status: DC

## 2013-03-22 NOTE — Progress Notes (Signed)
  Subjective:    Patient ID: Martin Anthony, male    DOB: 14-Aug-1965, 47 y.o.   MRN: [id number]  HPI 47 yr old male for a cpx. He feels well but he has a lot of lab abnormalities and his BP has been running high. He has been taking two Ziac pills a day and his BP at home remains around 140/90. He has put on about 20 lbs of weight the last few years. He admits to drinking a lot of alcohol.    Review of Systems  Constitutional: Negative.   HENT: Negative.   Eyes: Negative.   Respiratory: Negative.   Cardiovascular: Negative.   Gastrointestinal: Negative.   Genitourinary: Negative.   Musculoskeletal: Negative.   Skin: Negative.   Neurological: Negative.   Psychiatric/Behavioral: Negative.        Objective:   Physical Exam  Constitutional: He is oriented to person, place, and time. He appears well-developed and well-nourished. No distress.  HENT:  Head: Normocephalic and atraumatic.  Right Ear: External ear normal.  Left Ear: External ear normal.  Nose: Nose normal.  Mouth/Throat: Oropharynx is clear and moist. No oropharyngeal exudate.  Eyes: Conjunctivae and EOM are normal. Pupils are equal, round, and reactive to light. Right eye exhibits no discharge. Left eye exhibits no discharge. No scleral icterus.  Neck: Neck supple. No JVD present. No tracheal deviation present. No thyromegaly present.  Cardiovascular: Normal rate, regular rhythm, normal heart sounds and intact distal pulses.  Exam reveals no gallop and no friction rub.   No murmur heard. Pulmonary/Chest: Effort normal and breath sounds normal. No respiratory distress. He has no wheezes. He has no rales. He exhibits no tenderness.  Abdominal: Soft. Bowel sounds are normal. He exhibits no distension and no mass. There is no tenderness. There is no rebound and no guarding.  Genitourinary: Rectum normal, prostate normal and penis normal. Guaiac negative stool. No penile tenderness.  Musculoskeletal: Normal range of motion. He  exhibits no edema and no tenderness.  Lymphadenopathy:    He has no cervical adenopathy.  Neurological: He is alert and oriented to person, place, and time. He has normal reflexes. No cranial nerve deficit. He exhibits normal muscle tone. Coordination normal.  Skin: Skin is warm and dry. No rash noted. He is not diaphoretic. No erythema. No pallor.  Psychiatric: He has a normal mood and affect. His behavior is normal. Judgment and thought content normal.          Assessment & Plan:  Well exam. We will increase the Ziac to 10/6.25 daily. Start on Fenofibrate daily. He will work on losing weight and reducing his alcohol intake. Recheck with labs in 3 months

## 2013-03-23 ENCOUNTER — Other Ambulatory Visit: Payer: BC Managed Care – PPO

## 2013-10-03 ENCOUNTER — Ambulatory Visit (INDEPENDENT_AMBULATORY_CARE_PROVIDER_SITE_OTHER): Payer: BC Managed Care – PPO | Admitting: Family Medicine

## 2013-10-03 ENCOUNTER — Encounter: Payer: Self-pay | Admitting: Family Medicine

## 2013-10-03 VITALS — BP 146/88 | HR 62 | Temp 99.0°F | Ht 75.0 in | Wt 238.0 lb

## 2013-10-03 DIAGNOSIS — M109 Gout, unspecified: Secondary | ICD-10-CM | POA: Insufficient documentation

## 2013-10-03 MED ORDER — INDOMETHACIN 50 MG PO CAPS: 50.0000 mg | ORAL_CAPSULE | Freq: Three times a day (TID) | ORAL | Status: DC | PRN

## 2013-10-03 MED ORDER — METHYLPREDNISOLONE ACETATE 80 MG/ML IJ SUSP
160.0000 mg | Freq: Once | INTRAMUSCULAR | Status: AC
Start: 2013-10-03 — End: 2013-10-03
  Administered 2013-10-03: 160 mg via INTRAMUSCULAR

## 2013-10-03 NOTE — Progress Notes (Signed)
   Subjective:    Patient ID: Martin Anthony, male    DOB: 11-Sep-1965, 48 y.o.   MRN: [id number]  HPI Here for 5 days of pain and swelling in the right foot. This started in the great toe and then spread to the upper foot. Elevation and Advil help slightly.    Review of Systems  Constitutional: Negative.   Musculoskeletal: Positive for arthralgias and joint swelling.       Objective:   Physical Exam  Constitutional: He appears well-developed and well-nourished.  Musculoskeletal:  He has swelling and redness and warmth in the medial right foot. He is extremely tender over the great toe MTP joint          Assessment & Plan:  Given a steroid shot and Indomethacin.

## 2013-10-03 NOTE — Addendum Note (Signed)
Addended by: Aggie Hacker A on: 10/03/2013 05:07 PM   Modules accepted: Orders

## 2013-10-03 NOTE — Progress Notes (Signed)
Pre visit review using our clinic review tool, if applicable. No additional management support is needed unless otherwise documented below in the visit note. 

## 2014-04-02 ENCOUNTER — Telehealth: Payer: Self-pay | Admitting: Family Medicine

## 2014-04-02 NOTE — Telephone Encounter (Signed)
Pt also needs bisoprolol-hydrochlorothiazide (ZIAC) 10-6.25 MG per tablet Pt not seen since 03/22/13. [facility]

## 2014-04-02 NOTE — Telephone Encounter (Signed)
[id number] - [place], [place] [address] is requesting re-fill on sertraline (ZOLOFT) 50 MG tablet

## 2014-04-02 NOTE — Telephone Encounter (Signed)
Refill both for one year. 

## 2014-04-04 MED ORDER — SERTRALINE HCL 50 MG PO TABS: 50.0000 mg | ORAL_TABLET | Freq: Every day | ORAL | Status: DC

## 2014-04-04 MED ORDER — BISOPROLOL-HYDROCHLOROTHIAZIDE 10-6.25 MG PO TABS: 1.0000 | ORAL_TABLET | Freq: Every day | ORAL | Status: DC

## 2014-04-04 NOTE — Telephone Encounter (Signed)
I sent both scripts e-scribe and left a voice message with this information.

## 2015-04-17 ENCOUNTER — Other Ambulatory Visit: Payer: Self-pay | Admitting: Family Medicine

## 2015-04-26 ENCOUNTER — Telehealth: Payer: Self-pay | Admitting: Family Medicine

## 2015-04-26 NOTE — Telephone Encounter (Signed)
Pt request refill   sertraline (ZOLOFT) 50 MG tablet bisoprolol-hydrochlorothiazide (ZIAC) 10-6.25 MG per tablet  Pt last CPE was in 01/2013. Advised pt to make appt. In order to get refills to get any refills. Pt DID schedule his cpe/labs  Tues 12/06   [facility]

## 2015-04-26 NOTE — Telephone Encounter (Signed)
Call in 30 day supply of each

## 2015-04-30 MED ORDER — SERTRALINE HCL 50 MG PO TABS: 50.0000 mg | ORAL_TABLET | Freq: Every day | ORAL | Status: DC

## 2015-04-30 MED ORDER — BISOPROLOL-HYDROCHLOROTHIAZIDE 10-6.25 MG PO TABS: 1.0000 | ORAL_TABLET | Freq: Every day | ORAL | Status: DC

## 2015-04-30 NOTE — Telephone Encounter (Signed)
I sent both scripts e-scribe and left a voice message for pt with this information. 

## 2015-05-17 ENCOUNTER — Other Ambulatory Visit (INDEPENDENT_AMBULATORY_CARE_PROVIDER_SITE_OTHER): Payer: BLUE CROSS/BLUE SHIELD

## 2015-05-17 DIAGNOSIS — R7989 Other specified abnormal findings of blood chemistry: Secondary | ICD-10-CM

## 2015-05-17 DIAGNOSIS — Z0000 Encounter for general adult medical examination without abnormal findings: Secondary | ICD-10-CM

## 2015-05-17 LAB — CBC WITH DIFFERENTIAL/PLATELET
BASOS ABS: 0 10*3/uL (ref 0.0–0.1)
BASOS PCT: 0.7 % (ref 0.0–3.0)
EOS ABS: 0.1 10*3/uL (ref 0.0–0.7)
Eosinophils Relative: 1.7 % (ref 0.0–5.0)
HCT: 40.4 % (ref 39.0–52.0)
Hemoglobin: 13.4 g/dL (ref 13.0–17.0)
LYMPHS PCT: 28.2 % (ref 12.0–46.0)
Lymphs Abs: 1.3 10*3/uL (ref 0.7–4.0)
MCHC: 33.1 g/dL (ref 30.0–36.0)
MCV: 85.3 fl (ref 78.0–100.0)
MONO ABS: 0.3 10*3/uL (ref 0.1–1.0)
Monocytes Relative: 7.8 % (ref 3.0–12.0)
NEUTROS ABS: 2.7 10*3/uL (ref 1.4–7.7)
NEUTROS PCT: 61.6 % (ref 43.0–77.0)
PLATELETS: 228 10*3/uL (ref 150.0–400.0)
RBC: 4.74 Mil/uL (ref 4.22–5.81)
RDW: 16 % — AB (ref 11.5–15.5)
WBC: 4.5 10*3/uL (ref 4.0–10.5)

## 2015-05-17 LAB — BASIC METABOLIC PANEL
BUN: 13 mg/dL (ref 6–23)
CALCIUM: 9.6 mg/dL (ref 8.4–10.5)
CHLORIDE: 103 meq/L (ref 96–112)
CO2: 26 meq/L (ref 19–32)
CREATININE: 1.08 mg/dL (ref 0.40–1.50)
GFR: 77.53 mL/min (ref >60.00)
Glucose, Bld: 143 mg/dL — ABNORMAL HIGH (ref 70–99)
Potassium: 4.4 mEq/L (ref 3.5–5.1)
Sodium: 141 mEq/L (ref 135–145)

## 2015-05-17 LAB — POCT URINALYSIS DIPSTICK
Glucose, UA: NEGATIVE
Leukocytes, UA: NEGATIVE
Nitrite, UA: NEGATIVE
RBC UA: NEGATIVE
UROBILINOGEN UA: 0.2
pH, UA: 5.5

## 2015-05-17 LAB — HEPATIC FUNCTION PANEL
ALK PHOS: 90 U/L (ref 39–117)
ALT: 246 U/L — ABNORMAL HIGH (ref 0–53)
AST: 249 U/L — ABNORMAL HIGH (ref 0–37)
Albumin: 4.3 g/dL (ref 3.5–5.2)
BILIRUBIN DIRECT: 0.1 mg/dL (ref 0.0–0.3)
BILIRUBIN TOTAL: 0.6 mg/dL (ref 0.2–1.2)
Total Protein: 7.8 g/dL (ref 6.0–8.3)

## 2015-05-17 LAB — LIPID PANEL
CHOL/HDL RATIO: 5
CHOLESTEROL: 238 mg/dL — AB (ref 0–200)
HDL: 45.5 mg/dL (ref >39.00)
NonHDL: 192.77
Triglycerides: 363 mg/dL — ABNORMAL HIGH (ref 0.0–149.0)
VLDL: 72.6 mg/dL — ABNORMAL HIGH (ref 0.0–40.0)

## 2015-05-17 LAB — LDL CHOLESTEROL, DIRECT: LDL DIRECT: 133 mg/dL

## 2015-05-17 LAB — TSH: TSH: 1.39 u[IU]/mL (ref 0.35–4.50)

## 2015-05-21 ENCOUNTER — Encounter: Payer: Self-pay | Admitting: Family Medicine

## 2015-05-28 ENCOUNTER — Other Ambulatory Visit: Payer: Self-pay | Admitting: Family Medicine

## 2015-06-20 ENCOUNTER — Other Ambulatory Visit: Payer: Self-pay | Admitting: Family Medicine

## 2015-06-20 NOTE — Telephone Encounter (Signed)
Pt has an appt on 06-25-15 and needs refill on sertaline and bisoprolol-hctz [facility] [address]

## 2015-06-21 ENCOUNTER — Other Ambulatory Visit: Payer: Self-pay | Admitting: Family Medicine

## 2015-06-21 MED ORDER — BISOPROLOL-HYDROCHLOROTHIAZIDE 10-6.25 MG PO TABS: 1.0000 | ORAL_TABLET | Freq: Every day | ORAL | Status: DC

## 2015-06-21 MED ORDER — SERTRALINE HCL 50 MG PO TABS: 50.0000 mg | ORAL_TABLET | Freq: Every day | ORAL | Status: DC

## 2015-06-21 NOTE — Telephone Encounter (Signed)
Call in 30 days of both

## 2015-06-21 NOTE — Telephone Encounter (Signed)
Rx was sent to the pharmacy  

## 2015-06-25 ENCOUNTER — Encounter: Payer: Self-pay | Admitting: Family Medicine

## 2015-06-25 ENCOUNTER — Ambulatory Visit (INDEPENDENT_AMBULATORY_CARE_PROVIDER_SITE_OTHER): Payer: 59 | Admitting: Family Medicine

## 2015-06-25 VITALS — BP 174/96 | HR 71 | Temp 98.6°F | Ht 75.0 in | Wt 230.0 lb

## 2015-06-25 DIAGNOSIS — E119 Type 2 diabetes mellitus without complications: Secondary | ICD-10-CM | POA: Diagnosis not present

## 2015-06-25 DIAGNOSIS — F1010 Alcohol abuse, uncomplicated: Secondary | ICD-10-CM

## 2015-06-25 DIAGNOSIS — Z0000 Encounter for general adult medical examination without abnormal findings: Secondary | ICD-10-CM | POA: Diagnosis not present

## 2015-06-25 LAB — HEMOGLOBIN A1C: Hgb A1c MFr Bld: 6.6 % — ABNORMAL HIGH (ref 4.6–6.5)

## 2015-06-25 MED ORDER — AMLODIPINE BESYLATE 5 MG PO TABS: 5.0000 mg | ORAL_TABLET | Freq: Every day | ORAL | Status: DC

## 2015-06-25 MED ORDER — ALPRAZOLAM 0.5 MG PO TABS: 0.5000 mg | ORAL_TABLET | Freq: Three times a day (TID) | ORAL | Status: DC | PRN

## 2015-06-25 MED ORDER — SERTRALINE HCL 50 MG PO TABS: 50.0000 mg | ORAL_TABLET | Freq: Every day | ORAL | Status: DC

## 2015-06-25 MED ORDER — LISINOPRIL-HYDROCHLOROTHIAZIDE 10-12.5 MG PO TABS: 1.0000 | ORAL_TABLET | Freq: Every day | ORAL | Status: DC

## 2015-06-25 NOTE — Progress Notes (Signed)
Subjective:    Patient ID: Martin Anthony, male    DOB: 04-18-1966, 50 y.o.   MRN: [id number]  HPI 50 yr old male for a cpx. He admits that he has had a difficult past year and that the main reason for this is alcohol abuse. He has been uinder stress with his home life and he has been drinking a significant amount of alcohol every day for about 6 months. His wife left him suddenly and took their 50 year old daughter with her. He has no idea where they are but he thinks they are still in [place]. She has filed for divorce. This has caused him to deal with a lot of anxiety and depression. He still takes Zoloft and uses Xanax about every day or two. He has felt very fatigued but has no other specific complaints. He says he has been drinking a lot and eating a very poor diet. His labs show a fasting glucose of 143, his lipids are elevated, and his BP is up.    Review of Systems  Constitutional: Positive for appetite change and fatigue. Negative for fever, chills, diaphoresis, activity change and unexpected weight change.  HENT: Negative.   Eyes: Negative.   Respiratory: Negative.   Cardiovascular: Negative.   Gastrointestinal: Negative.   Genitourinary: Negative.   Musculoskeletal: Negative.   Skin: Negative.   Neurological: Negative.   Psychiatric/Behavioral: Positive for dysphoric mood. Negative for suicidal ideas, hallucinations, behavioral problems, confusion, sleep disturbance, self-injury, decreased concentration and agitation. The patient is nervous/anxious. The patient is not hyperactive.        Objective:   Physical Exam  Constitutional: He is oriented to person, place, and time. He appears well-developed and well-nourished. No distress.  HENT:  Head: Normocephalic and atraumatic.  Right Ear: External ear normal.  Left Ear: External ear normal.  Nose: Nose normal.  Mouth/Throat: Oropharynx is clear and moist. No oropharyngeal exudate.  Eyes: Conjunctivae and EOM are normal.  Pupils are equal, round, and reactive to light. Right eye exhibits no discharge. Left eye exhibits no discharge. No scleral icterus.  Neck: Neck supple. No JVD present. No tracheal deviation present. No thyromegaly present.  Cardiovascular: Normal rate, regular rhythm, normal heart sounds and intact distal pulses.  Exam reveals no gallop and no friction rub.   No murmur heard. Pulmonary/Chest: Effort normal and breath sounds normal. No respiratory distress. He has no wheezes. He has no rales. He exhibits no tenderness.  Abdominal: Soft. Bowel sounds are normal. He exhibits no distension and no mass. There is no tenderness. There is no rebound and no guarding.  Genitourinary: Rectum normal, prostate normal and penis normal. Guaiac negative stool. No penile tenderness.  Musculoskeletal: Normal range of motion. He exhibits no edema or tenderness.  Lymphadenopathy:    He has no cervical adenopathy.  Neurological: He is alert and oriented to person, place, and time. He has normal reflexes. No cranial nerve deficit. He exhibits normal muscle tone. Coordination normal.  Skin: Skin is warm and dry. No rash noted. He is not diaphoretic. No erythema. No pallor.  Psychiatric: He has a normal mood and affect. His behavior is normal. Judgment and thought content normal.          Assessment & Plan:  Well exam. We spent a long time discussing alcohol abuse and its treatment. [place] has determined that he needs an in patient stay at a 28 day facility and he asks advice. His HTN is poorly controlled and he has  now developed type 2 diabetes. We agreed that most of these issues are due to the alcohol abuse and these will improve when he stops drinking. We agreed to hold off on diabetes medications for now and to focus on a better diet. For the HTN we will stop Ziac and switch to Amlodipine 5 mg daily and Lisinopril HCT 10-12.5 daily. Stay on Zoloft and Xanax for now. We will see him back for a recheck in 3 weeks.  Get a baseline A1c today. He will explore various local inpatient programs for the alcohol abuse, and I suggested he go to the 24 hour intake desk at [facility] to get advice about alcohol treatment programs in the area.

## 2015-06-25 NOTE — Progress Notes (Signed)
Pre visit review using our clinic review tool, if applicable. No additional management support is needed unless otherwise documented below in the visit note. 

## 2015-07-17 ENCOUNTER — Ambulatory Visit: Payer: 59 | Admitting: Family Medicine

## 2015-07-18 ENCOUNTER — Other Ambulatory Visit: Payer: Self-pay | Admitting: Family Medicine

## 2015-07-30 ENCOUNTER — Ambulatory Visit (INDEPENDENT_AMBULATORY_CARE_PROVIDER_SITE_OTHER): Payer: 59 | Admitting: Family Medicine

## 2015-07-30 ENCOUNTER — Encounter: Payer: Self-pay | Admitting: Family Medicine

## 2015-07-30 VITALS — BP 154/90 | HR 100 | Temp 98.8°F | Ht 75.0 in | Wt 227.0 lb

## 2015-07-30 DIAGNOSIS — F1010 Alcohol abuse, uncomplicated: Secondary | ICD-10-CM | POA: Diagnosis not present

## 2015-07-30 DIAGNOSIS — I10 Essential (primary) hypertension: Secondary | ICD-10-CM

## 2015-07-30 DIAGNOSIS — F411 Generalized anxiety disorder: Secondary | ICD-10-CM

## 2015-07-30 MED ORDER — BISOPROLOL-HYDROCHLOROTHIAZIDE 10-6.25 MG PO TABS: 1.0000 | ORAL_TABLET | Freq: Two times a day (BID) | ORAL | Status: DC

## 2015-07-30 NOTE — Progress Notes (Signed)
   Subjective:    Patient ID: Martin Anthony, male    DOB: 11/14/1965, 50 y.o.   MRN: [id number]  HPI Here to follow up on several issues. When we met 3 weeks ago he was taking Ziac once a day but his BP was very poorly controlled. He stopped this and went on Amlodipine and Lisinopril HCT. His BP is better, but he complains of numerous side effects like pounding in the chest, numbness and tingling all over the body, and lightheadedness. He is still drinking a lot of alcohol every day and he has not yet looked into any rehab facilities, as we had discussed. He is going through a separation and he is still quite anxious.    Review of Systems  Constitutional: Negative.   Respiratory: Negative.   Cardiovascular: Positive for palpitations. Negative for chest pain and leg swelling.  Neurological: Positive for light-headedness. Negative for dizziness and headaches.  Psychiatric/Behavioral: The patient is nervous/anxious.        Objective:   Physical Exam  Constitutional: He is oriented to person, place, and time. He appears well-developed and well-nourished. No distress.  Neck: No thyromegaly present.  Cardiovascular: Normal rate, regular rhythm, normal heart sounds and intact distal pulses.   Pulmonary/Chest: Effort normal and breath sounds normal.  Lymphadenopathy:    He has no cervical adenopathy.  Neurological: He is alert and oriented to person, place, and time.  Psychiatric: He has a normal mood and affect. His behavior is normal. Thought content normal.          Assessment & Plan:  His anxiety is stable. For the HTN, we agreed to stop both the Amlodipine and the Lisinopril HCT. He will go back on Ziac 10/6.25 but we will increase the dosing to BID. Recheck in 4 weeks. I urged him to do everything he can to cut back or stop his use of alcohol.

## 2015-07-30 NOTE — Progress Notes (Signed)
Pre visit review using our clinic review tool, if applicable. No additional management support is needed unless otherwise documented below in the visit note. 

## 2016-05-02 ENCOUNTER — Other Ambulatory Visit: Payer: Self-pay | Admitting: Family Medicine

## 2016-05-23 ENCOUNTER — Emergency Department (HOSPITAL_COMMUNITY)
Admission: EM | Admit: 2016-05-23 | Discharge: 2016-05-23 | Discharge status: Against Medical Advice | Payer: 59 | Attending: Emergency Medicine | Admitting: Emergency Medicine

## 2016-05-23 ENCOUNTER — Encounter (HOSPITAL_COMMUNITY): Payer: Self-pay | Admitting: Emergency Medicine

## 2016-05-23 DIAGNOSIS — S0121XA Laceration without foreign body of nose, initial encounter: Secondary | ICD-10-CM | POA: Insufficient documentation

## 2016-05-23 DIAGNOSIS — Y9289 Other specified places as the place of occurrence of the external cause: Secondary | ICD-10-CM | POA: Diagnosis not present

## 2016-05-23 DIAGNOSIS — S61012A Laceration without foreign body of left thumb without damage to nail, initial encounter: Secondary | ICD-10-CM | POA: Insufficient documentation

## 2016-05-23 DIAGNOSIS — I10 Essential (primary) hypertension: Secondary | ICD-10-CM | POA: Insufficient documentation

## 2016-05-23 DIAGNOSIS — Z87891 Personal history of nicotine dependence: Secondary | ICD-10-CM | POA: Diagnosis not present

## 2016-05-23 DIAGNOSIS — W1839XA Other fall on same level, initial encounter: Secondary | ICD-10-CM | POA: Diagnosis not present

## 2016-05-23 DIAGNOSIS — E119 Type 2 diabetes mellitus without complications: Secondary | ICD-10-CM | POA: Diagnosis not present

## 2016-05-23 DIAGNOSIS — Y999 Unspecified external cause status: Secondary | ICD-10-CM | POA: Insufficient documentation

## 2016-05-23 DIAGNOSIS — Z79899 Other long term (current) drug therapy: Secondary | ICD-10-CM | POA: Diagnosis not present

## 2016-05-23 DIAGNOSIS — Y939 Activity, unspecified: Secondary | ICD-10-CM | POA: Diagnosis not present

## 2016-05-23 DIAGNOSIS — S6992XA Unspecified injury of left wrist, hand and finger(s), initial encounter: Secondary | ICD-10-CM | POA: Diagnosis present

## 2016-05-23 DIAGNOSIS — W19XXXA Unspecified fall, initial encounter: Secondary | ICD-10-CM

## 2016-05-23 NOTE — ED Notes (Signed)
Pt out in hallway stating "i'm ready to leave, please show me the exit" This RN and 2 EMT's urged pt to return to room and patient stated "I'm ready to leave, I've talked to the Dr and I'm ready to leave" Pt ambulated without difficulty down the hallway towards the exit. Dr Canary Brim following patient to talk with patient along with [facility] RN

## 2016-05-23 NOTE — ED Notes (Signed)
Pt educated on the risks of leaving without further examination. EDP aware.

## 2016-05-23 NOTE — ED Provider Notes (Signed)
[place] DEPT Provider Note   CSN: [id number] Arrival date & time: 05/23/16  2013     History   Chief Complaint Chief Complaint  Patient presents with  . Fall    HPI BRAM HASSER is a 50 y.o. male with history of alcoholism, diabetes, hypertension who presents with intoxication and fall. Patient was found by EMS after falling over a curb and hitting his head on a sign. Patient does not recall the incident, but does recall drinking 5-6 liquor drinks this evening. Patient reports pain to his face where he is bleeding, as well as his left thumb which is wrapped in a bloody gauze. Patient denies any pain elsewhere. He denies any chest pain, shortness of breath, abdominal pain, nausea, vomiting, back pain, neck pain, or any other joint pain. Patient reports tetanus is up to date.  HPI  Past Medical History:  Diagnosis Date  . Anxiety   . Depression   . Diabetes mellitus without complication ([place])   . GERD (gastroesophageal reflux disease)   . Gout   . Hyperlipidemia   . Hypertension   . Substance abuse    alcoholism     Patient Active Problem List   Diagnosis Date Noted  . Diabetes mellitus without complication ([place]) [id number]  . Alcohol abuse 06/25/2015  . Gout 10/03/2013  . Anxiety state 07/09/2009  . HYPERLIPIDEMIA 05/20/2007  . DEPRESSION 05/20/2007  . Essential hypertension 05/20/2007  . GERD 05/20/2007    Past Surgical History:  Procedure Laterality Date  . HERNIA REPAIR     umbilical   . INGUINAL HERNIA REPAIR Bilateral        Home Medications    Prior to Admission medications   Medication Sig Start Date End Date Taking? Authorizing Provider  ALPRAZolam Duanne Moron) 0.5 MG tablet Take 1 tablet (0.5 mg total) by mouth 3 (three) times daily as needed for anxiety. Patient not taking: Reported on 07/30/2015 06/25/15   [name], MD  bisoprolol-hydrochlorothiazide [facility]) 10-6.25 MG tablet TAKE 1 TABLET BY MOUTH TWICE A DAY 05/04/16   [name], MD    sertraline (ZOLOFT) 50 MG tablet Take 1 tablet (50 mg total) by mouth daily. 06/25/15   [name], MD    Family History Family History  Problem Relation Age of Onset  . Hypertension Mother   . Diabetes Father   . Heart attack Father   . Pneumonia Father   . Hypertension Father   . Diabetes Maternal Grandfather     Social History Social History  Substance Use Topics  . Smoking status: Former Smoker    Quit date: 08/11/2001  . Smokeless tobacco: Never Used  . Alcohol use 1.2 oz/week    2 Standard drinks or equivalent per week     Allergies   Patient has no known allergies.   Review of Systems Review of Systems  Constitutional: Negative for chills and fever.  HENT: Positive for facial swelling. Negative for sore throat.   Respiratory: Negative for shortness of breath.   Cardiovascular: Negative for chest pain.  Gastrointestinal: Negative for abdominal pain, nausea and vomiting.  Genitourinary: Negative for dysuria.  Musculoskeletal: Negative for back pain and neck pain.  Skin: Positive for wound. Negative for rash.  Neurological: Negative for headaches.  Psychiatric/Behavioral: The patient is not nervous/anxious.      Physical Exam Updated Vital Signs BP 138/83 (BP Location: Right Arm)   Pulse 61   Temp 98.4 F (36.9 C) (Oral)   Resp 16  Ht 6\' 3"  (1.905 m)   SpO2 98%   Physical Exam  Constitutional: He appears well-developed and well-nourished. No distress.  Patient intoxicated, however able to answer questions appropriately  HENT:  Head: Normocephalic and atraumatic.  Mouth/Throat: Oropharynx is clear and moist. No oropharyngeal exudate.  Eyes: Conjunctivae and EOM are normal. Pupils are equal, round, and reactive to light. Right eye exhibits no discharge. Left eye exhibits no discharge. No scleral icterus.  Neck: Normal range of motion. Neck supple. No thyromegaly present.  Cardiovascular: Normal rate, regular rhythm, normal heart sounds and intact  distal pulses.  Exam reveals no gallop and no friction rub.   No murmur heard. Pulmonary/Chest: Effort normal and breath sounds normal. No stridor. No respiratory distress. He has no wheezes. He has no rales.  Abdominal: Soft. Bowel sounds are normal. He exhibits no distension. There is no tenderness. There is no rebound and no guarding.  Musculoskeletal: Normal range of motion. He exhibits no edema.  Full range of motion of left thumb, normal sensation, cap refill <2secs No midline cervical, thoracic, or lumbar tenderness  Lymphadenopathy:    He has no cervical adenopathy.  Neurological: He is alert. Coordination normal.  CN 3-12 intact; normal sensation throughout; 5/5 strength in all 4 extremities; equal bilateral grip strength; no ataxia on finger-to-nose   Skin: Skin is warm and dry. No rash noted. He is not diaphoretic. No pallor.  Abrasion/laceration to dorsum of left thumb over PIP; 1 cm laceration over bridge of nose, tenderness   Psychiatric: He has a normal mood and affect.  Nursing note and vitals reviewed.    ED Treatments / Results  Labs (all labs ordered are listed, but only abnormal results are displayed) Labs Reviewed - No data to display  EKG  EKG Interpretation None       Radiology No results found.  Procedures Procedures (including critical care time)  Medications Ordered in ED Medications - No data to display   Initial Impression / Assessment and Plan / ED Course  I have reviewed the triage vital signs and the nursing notes.  Pertinent labs & imaging results that were available during my care of the patient were reviewed by me and considered in my medical decision making (see chart for details).  Clinical Course     CT head, C-spine, maxillofacial, and L hand x-ray pending when patient left AMA. Dr. Canary Brim discussed with patient the risks of leaving including death and he understood the risks and left anyway. He signed out AMA. Patient was  conscious, not stuporous or slurring, and able to answer questions appropriately. Patient left AMA prior to wound care being provided.   Final Clinical Impressions(s) / ED Diagnoses   Final diagnoses:  Fall    New Prescriptions New Prescriptions   No medications on file     Frederica Kuster, Hershal Coria 05/23/16 2238    Alfonzo Beers, MD 05/23/16 2244

## 2016-05-23 NOTE — ED Triage Notes (Signed)
Per EMS, [place]. Pt fell into a sign, complete c-spine immobilization upon arrival to this ED. EMS reported GCS-14, BP-138/82, HR-64, CBG-348

## 2016-05-23 NOTE — ED Notes (Signed)
Dr Canary Brim spoke with pt and pt refuses to stay and to sign out AMA with [facility] RN

## 2016-05-23 NOTE — ED Notes (Signed)
Pt ambulated to waiting room to wait for a ride. Pt d/c ama.

## 2016-05-23 NOTE — ED Notes (Signed)
Pt removed c-collar, head blocks, and spine board from himself. Pt walking around treatment room, assisted back to the stretcher without difficulty.

## 2016-07-05 ENCOUNTER — Other Ambulatory Visit: Payer: Self-pay | Admitting: Family Medicine

## 2017-03-13 ENCOUNTER — Other Ambulatory Visit: Payer: Self-pay | Admitting: Family Medicine

## 2017-04-15 ENCOUNTER — Other Ambulatory Visit: Payer: Self-pay | Admitting: Family Medicine

## 2017-07-25 ENCOUNTER — Other Ambulatory Visit: Payer: Self-pay | Admitting: Family Medicine

## 2017-08-13 MED ORDER — SERTRALINE HCL 50 MG PO TABS: 50.0000 mg | ORAL_TABLET | Freq: Every day | ORAL | 0 refills | Status: DC

## 2017-08-13 NOTE — Telephone Encounter (Signed)
Last OV: 07/20/15; new appointment scheduled 08/16/17 PCP: Sarajane Jews Pharmacy:  [id number] [place], [place] [phone] (Phone) [phone] (Fax)

## 2017-08-13 NOTE — Telephone Encounter (Signed)
Pt is scheduled for 08/16/17 and requesting med be sent in today if possible.

## 2017-08-13 NOTE — Addendum Note (Signed)
Addended by: Matilde Sprang on: 08/13/2017 02:48 PM   Modules accepted: Orders

## 2017-08-16 ENCOUNTER — Ambulatory Visit (INDEPENDENT_AMBULATORY_CARE_PROVIDER_SITE_OTHER): Payer: 59 | Admitting: Family Medicine

## 2017-08-16 ENCOUNTER — Encounter: Payer: Self-pay | Admitting: Family Medicine

## 2017-08-16 VITALS — BP 150/80 | HR 79 | Temp 98.3°F | Wt 205.8 lb

## 2017-08-16 DIAGNOSIS — F418 Other specified anxiety disorders: Secondary | ICD-10-CM

## 2017-08-16 DIAGNOSIS — I10 Essential (primary) hypertension: Secondary | ICD-10-CM

## 2017-08-16 MED ORDER — SERTRALINE HCL 50 MG PO TABS: 50.0000 mg | ORAL_TABLET | Freq: Every day | ORAL | 11 refills | Status: DC

## 2017-08-16 MED ORDER — BISOPROLOL-HYDROCHLOROTHIAZIDE 10-6.25 MG PO TABS: 1.0000 | ORAL_TABLET | Freq: Two times a day (BID) | ORAL | 11 refills | Status: DC

## 2017-08-16 NOTE — Progress Notes (Signed)
   Subjective:    Patient ID: Martin Anthony, male    DOB: 16-Sep-1965, 52 y.o.   MRN: [id number]  HPI Here after an absence of 2 years. He feels well and his BP has been stable.    Review of Systems  Constitutional: Negative.   Respiratory: Negative.   Cardiovascular: Negative.   Neurological: Negative.        Objective:   Physical Exam  Constitutional: He is oriented to person, place, and time. He appears well-developed and well-nourished.  Cardiovascular: Normal rate, regular rhythm, normal heart sounds and intact distal pulses.  Pulmonary/Chest: Effort normal and breath sounds normal. No respiratory distress. He has no wheezes. He has no rales.  Musculoskeletal: He exhibits no edema.  Neurological: He is alert and oriented to person, place, and time.  Psychiatric: He has a normal mood and affect. His behavior is normal. Thought content normal.          Assessment & Plan:  His HTN and depression are stable. Meds are refilled. He will set up a well exam with fasting labs soon. Alysia Penna, MD

## 2017-10-15 ENCOUNTER — Telehealth: Payer: Self-pay

## 2017-10-15 MED ORDER — SERTRALINE HCL 50 MG PO TABS: 50.0000 mg | ORAL_TABLET | Freq: Every day | ORAL | 3 refills | Status: DC

## 2017-10-15 NOTE — Telephone Encounter (Signed)
[facility] Rx has been sent for 90 day supply   Last refilled for 30 day supply 08/16/2017 disp 30 with 11 refills

## 2019-01-09 ENCOUNTER — Other Ambulatory Visit: Payer: Self-pay | Admitting: Family Medicine

## 2019-01-17 ENCOUNTER — Encounter (HOSPITAL_COMMUNITY): Payer: Self-pay | Admitting: Emergency Medicine

## 2019-01-17 ENCOUNTER — Other Ambulatory Visit: Payer: Self-pay

## 2019-01-17 DIAGNOSIS — Z9114 Patient's other noncompliance with medication regimen: Secondary | ICD-10-CM

## 2019-01-17 DIAGNOSIS — Z87891 Personal history of nicotine dependence: Secondary | ICD-10-CM

## 2019-01-17 DIAGNOSIS — Z79899 Other long term (current) drug therapy: Secondary | ICD-10-CM

## 2019-01-17 DIAGNOSIS — R64 Cachexia: Secondary | ICD-10-CM | POA: Diagnosis present

## 2019-01-17 DIAGNOSIS — I480 Paroxysmal atrial fibrillation: Secondary | ICD-10-CM | POA: Diagnosis present

## 2019-01-17 DIAGNOSIS — Z8249 Family history of ischemic heart disease and other diseases of the circulatory system: Secondary | ICD-10-CM

## 2019-01-17 DIAGNOSIS — D509 Iron deficiency anemia, unspecified: Secondary | ICD-10-CM | POA: Diagnosis present

## 2019-01-17 DIAGNOSIS — E1165 Type 2 diabetes mellitus with hyperglycemia: Secondary | ICD-10-CM | POA: Diagnosis present

## 2019-01-17 DIAGNOSIS — F1020 Alcohol dependence, uncomplicated: Secondary | ICD-10-CM | POA: Diagnosis present

## 2019-01-17 DIAGNOSIS — K7031 Alcoholic cirrhosis of liver with ascites: Principal | ICD-10-CM | POA: Diagnosis present

## 2019-01-17 DIAGNOSIS — E785 Hyperlipidemia, unspecified: Secondary | ICD-10-CM | POA: Diagnosis present

## 2019-01-17 DIAGNOSIS — Z20828 Contact with and (suspected) exposure to other viral communicable diseases: Secondary | ICD-10-CM | POA: Diagnosis present

## 2019-01-17 DIAGNOSIS — D61818 Other pancytopenia: Secondary | ICD-10-CM | POA: Diagnosis present

## 2019-01-17 DIAGNOSIS — Z6823 Body mass index (BMI) 23.0-23.9, adult: Secondary | ICD-10-CM

## 2019-01-17 DIAGNOSIS — D122 Benign neoplasm of ascending colon: Secondary | ICD-10-CM | POA: Diagnosis present

## 2019-01-17 DIAGNOSIS — I10 Essential (primary) hypertension: Secondary | ICD-10-CM | POA: Diagnosis present

## 2019-01-17 DIAGNOSIS — C786 Secondary malignant neoplasm of retroperitoneum and peritoneum: Secondary | ICD-10-CM | POA: Diagnosis present

## 2019-01-17 DIAGNOSIS — M109 Gout, unspecified: Secondary | ICD-10-CM | POA: Diagnosis present

## 2019-01-17 DIAGNOSIS — D490 Neoplasm of unspecified behavior of digestive system: Secondary | ICD-10-CM | POA: Diagnosis present

## 2019-01-17 DIAGNOSIS — K3189 Other diseases of stomach and duodenum: Secondary | ICD-10-CM | POA: Diagnosis present

## 2019-01-17 DIAGNOSIS — E43 Unspecified severe protein-calorie malnutrition: Secondary | ICD-10-CM | POA: Diagnosis present

## 2019-01-17 DIAGNOSIS — K219 Gastro-esophageal reflux disease without esophagitis: Secondary | ICD-10-CM | POA: Diagnosis present

## 2019-01-17 DIAGNOSIS — D127 Benign neoplasm of rectosigmoid junction: Secondary | ICD-10-CM | POA: Diagnosis present

## 2019-01-17 DIAGNOSIS — F419 Anxiety disorder, unspecified: Secondary | ICD-10-CM | POA: Diagnosis present

## 2019-01-17 DIAGNOSIS — K766 Portal hypertension: Secondary | ICD-10-CM | POA: Diagnosis present

## 2019-01-17 DIAGNOSIS — Z833 Family history of diabetes mellitus: Secondary | ICD-10-CM

## 2019-01-17 DIAGNOSIS — F329 Major depressive disorder, single episode, unspecified: Secondary | ICD-10-CM | POA: Diagnosis present

## 2019-01-17 DIAGNOSIS — K7290 Hepatic failure, unspecified without coma: Secondary | ICD-10-CM | POA: Diagnosis present

## 2019-01-17 MED ORDER — SODIUM CHLORIDE 0.9% FLUSH
3.0000 mL | Freq: Once | INTRAVENOUS | Status: AC
  Administered 2019-01-18: 02:00:00 3 mL via INTRAVENOUS

## 2019-01-17 NOTE — ED Triage Notes (Signed)
Patient here from home with complaints of abd pain with distention. Reports nausea, vomiting x1 month. Increase on Friday.

## 2019-01-18 ENCOUNTER — Emergency Department (HOSPITAL_COMMUNITY): Payer: Self-pay

## 2019-01-18 ENCOUNTER — Inpatient Hospital Stay (HOSPITAL_COMMUNITY): Payer: Self-pay

## 2019-01-18 ENCOUNTER — Encounter (HOSPITAL_COMMUNITY): Payer: Self-pay

## 2019-01-18 ENCOUNTER — Inpatient Hospital Stay (HOSPITAL_COMMUNITY)
Admission: EM | Admit: 2019-01-18 | Discharge: 2019-01-22 | DRG: 432 | Disposition: A | Discharge status: Home / Self Care | Payer: Self-pay | Attending: Student | Admitting: Student

## 2019-01-18 DIAGNOSIS — R109 Unspecified abdominal pain: Secondary | ICD-10-CM | POA: Diagnosis present

## 2019-01-18 DIAGNOSIS — I10 Essential (primary) hypertension: Secondary | ICD-10-CM | POA: Diagnosis present

## 2019-01-18 DIAGNOSIS — I480 Paroxysmal atrial fibrillation: Secondary | ICD-10-CM

## 2019-01-18 DIAGNOSIS — K219 Gastro-esophageal reflux disease without esophagitis: Secondary | ICD-10-CM | POA: Diagnosis present

## 2019-01-18 DIAGNOSIS — K6389 Other specified diseases of intestine: Secondary | ICD-10-CM | POA: Diagnosis present

## 2019-01-18 DIAGNOSIS — R1010 Upper abdominal pain, unspecified: Secondary | ICD-10-CM

## 2019-01-18 DIAGNOSIS — F1010 Alcohol abuse, uncomplicated: Secondary | ICD-10-CM | POA: Diagnosis present

## 2019-01-18 DIAGNOSIS — R188 Other ascites: Secondary | ICD-10-CM

## 2019-01-18 DIAGNOSIS — R634 Abnormal weight loss: Secondary | ICD-10-CM

## 2019-01-18 DIAGNOSIS — K7011 Alcoholic hepatitis with ascites: Secondary | ICD-10-CM

## 2019-01-18 DIAGNOSIS — D649 Anemia, unspecified: Secondary | ICD-10-CM | POA: Diagnosis present

## 2019-01-18 LAB — CBC
HCT: 24.4 % — ABNORMAL LOW (ref 39.0–52.0)
Hemoglobin: 6.5 g/dL — CL (ref 13.0–17.0)
MCH: 20.8 pg — ABNORMAL LOW (ref 26.0–34.0)
MCHC: 26.6 g/dL — ABNORMAL LOW (ref 30.0–36.0)
MCV: 78 fL — ABNORMAL LOW (ref 80.0–100.0)
Platelets: 217 10*3/uL (ref 150–400)
RBC: 3.13 MIL/uL — ABNORMAL LOW (ref 4.22–5.81)
RDW: 21.4 % — ABNORMAL HIGH (ref 11.5–15.5)
WBC: 3.9 10*3/uL — ABNORMAL LOW (ref 4.0–10.5)
nRBC: 0 % (ref 0.0–0.2)

## 2019-01-18 LAB — HEMOGLOBIN AND HEMATOCRIT, BLOOD
HCT: 25.4 % — ABNORMAL LOW (ref 39.0–52.0)
Hemoglobin: 7.2 g/dL — ABNORMAL LOW (ref 13.0–17.0)

## 2019-01-18 LAB — URINALYSIS, ROUTINE W REFLEX MICROSCOPIC
Bilirubin Urine: NEGATIVE
Glucose, UA: NEGATIVE mg/dL
Hgb urine dipstick: NEGATIVE
Ketones, ur: 5 mg/dL — AB
Leukocytes,Ua: NEGATIVE
Nitrite: NEGATIVE
Protein, ur: NEGATIVE mg/dL
Specific Gravity, Urine: >1.046 — ABNORMAL HIGH (ref 1.005–1.030)
pH: 5 (ref 5.0–8.0)

## 2019-01-18 LAB — FERRITIN: Ferritin: 11 ng/mL — ABNORMAL LOW (ref 24–336)

## 2019-01-18 LAB — BODY FLUID CELL COUNT WITH DIFFERENTIAL
Eos, Fluid: 0 %
Lymphs, Fluid: 32 %
Monocyte-Macrophage-Serous Fluid: 62 % (ref 50–90)
Neutrophil Count, Fluid: 6 % (ref 0–25)
Total Nucleated Cell Count, Fluid: 94 cu mm (ref 0–1000)

## 2019-01-18 LAB — FOLATE: Folate: 19.3 ng/mL (ref >5.9)

## 2019-01-18 LAB — CBC WITH DIFFERENTIAL/PLATELET
Abs Immature Granulocytes: 0.01 10*3/uL (ref 0.00–0.07)
Basophils Absolute: 0 10*3/uL (ref 0.0–0.1)
Basophils Relative: 1 %
Eosinophils Absolute: 0.1 10*3/uL (ref 0.0–0.5)
Eosinophils Relative: 3 %
HCT: 21 % — ABNORMAL LOW (ref 39.0–52.0)
Hemoglobin: 5.6 g/dL — CL (ref 13.0–17.0)
Immature Granulocytes: 0 %
Lymphocytes Relative: 30 %
Lymphs Abs: 1 10*3/uL (ref 0.7–4.0)
MCH: 20.8 pg — ABNORMAL LOW (ref 26.0–34.0)
MCHC: 26.7 g/dL — ABNORMAL LOW (ref 30.0–36.0)
MCV: 78.1 fL — ABNORMAL LOW (ref 80.0–100.0)
Monocytes Absolute: 0.3 10*3/uL (ref 0.1–1.0)
Monocytes Relative: 9 %
Neutro Abs: 2 10*3/uL (ref 1.7–7.7)
Neutrophils Relative %: 57 %
Platelets: 162 10*3/uL (ref 150–400)
RBC: 2.69 MIL/uL — ABNORMAL LOW (ref 4.22–5.81)
RDW: 21.1 % — ABNORMAL HIGH (ref 11.5–15.5)
WBC: 3.4 10*3/uL — ABNORMAL LOW (ref 4.0–10.5)
nRBC: 0 % (ref 0.0–0.2)

## 2019-01-18 LAB — MAGNESIUM: Magnesium: 2.1 mg/dL (ref 1.7–2.4)

## 2019-01-18 LAB — COMPREHENSIVE METABOLIC PANEL
ALT: 14 U/L (ref 0–44)
AST: 41 U/L (ref 15–41)
Albumin: 3 g/dL — ABNORMAL LOW (ref 3.5–5.0)
Alkaline Phosphatase: 118 U/L (ref 38–126)
Anion gap: 10 (ref 5–15)
BUN: 10 mg/dL (ref 6–20)
CO2: 20 mmol/L — ABNORMAL LOW (ref 22–32)
Calcium: 8.5 mg/dL — ABNORMAL LOW (ref 8.9–10.3)
Chloride: 105 mmol/L (ref 98–111)
Creatinine, Ser: 0.98 mg/dL (ref 0.61–1.24)
GFR calc Af Amer: >60 mL/min (ref >60)
GFR calc non Af Amer: >60 mL/min (ref >60)
Glucose, Bld: 126 mg/dL — ABNORMAL HIGH (ref 70–99)
Potassium: 3.6 mmol/L (ref 3.5–5.1)
Sodium: 135 mmol/L (ref 135–145)
Total Bilirubin: 1.9 mg/dL — ABNORMAL HIGH (ref 0.3–1.2)
Total Protein: 7.8 g/dL (ref 6.5–8.1)

## 2019-01-18 LAB — PROTIME-INR
INR: 1.3 — ABNORMAL HIGH (ref 0.8–1.2)
Prothrombin Time: 16 seconds — ABNORMAL HIGH (ref 11.4–15.2)

## 2019-01-18 LAB — GLUCOSE, PLEURAL OR PERITONEAL FLUID: Glucose, Fluid: 106 mg/dL

## 2019-01-18 LAB — ABO/RH: ABO/RH(D): O POS

## 2019-01-18 LAB — IRON AND TIBC
Iron: 20 ug/dL — ABNORMAL LOW (ref 45–182)
Saturation Ratios: 6 % — ABNORMAL LOW (ref 17.9–39.5)
TIBC: 311 ug/dL (ref 250–450)
UIBC: 291 ug/dL

## 2019-01-18 LAB — SARS CORONAVIRUS 2 (TAT 6-24 HRS): SARS Coronavirus 2: NEGATIVE

## 2019-01-18 LAB — VITAMIN B12: Vitamin B-12: 459 pg/mL (ref 180–914)

## 2019-01-18 LAB — RETICULOCYTES
Immature Retic Fract: 34.7 % — ABNORMAL HIGH (ref 2.3–15.9)
RBC.: 2.98 MIL/uL — ABNORMAL LOW (ref 4.22–5.81)
Retic Count, Absolute: 46.5 10*3/uL (ref 19.0–186.0)
Retic Ct Pct: 1.6 % (ref 0.4–3.1)

## 2019-01-18 LAB — PROTEIN, PLEURAL OR PERITONEAL FLUID: Total protein, fluid: <3 g/dL

## 2019-01-18 LAB — PREPARE RBC (CROSSMATCH)

## 2019-01-18 LAB — HEMOGLOBIN A1C
Hgb A1c MFr Bld: 5.3 % (ref 4.8–5.6)
Mean Plasma Glucose: 105.41 mg/dL

## 2019-01-18 LAB — ALBUMIN, PLEURAL OR PERITONEAL FLUID: Albumin, Fluid: 1.1 g/dL

## 2019-01-18 LAB — ETHANOL: Alcohol, Ethyl (B): <10 mg/dL (ref <10)

## 2019-01-18 LAB — LIPASE, BLOOD: Lipase: 23 U/L (ref 11–51)

## 2019-01-18 LAB — POC OCCULT BLOOD, ED: Fecal Occult Bld: NEGATIVE

## 2019-01-18 LAB — TROPONIN I (HIGH SENSITIVITY): Troponin I (High Sensitivity): 5 ng/L (ref <18)

## 2019-01-18 MED ORDER — SERTRALINE HCL 50 MG PO TABS
50.0000 mg | ORAL_TABLET | Freq: Every day | ORAL | Status: DC
  Administered 2019-01-18 – 2019-01-22 (×5): 50 mg via ORAL
  Filled 2019-01-18 (×5): qty 1

## 2019-01-18 MED ORDER — BISOPROLOL FUMARATE 5 MG PO TABS
10.0000 mg | ORAL_TABLET | Freq: Every day | ORAL | Status: DC
  Administered 2019-01-18: 10 mg via ORAL
  Filled 2019-01-18: qty 1

## 2019-01-18 MED ORDER — THIAMINE HCL 100 MG/ML IJ SOLN
100.0000 mg | Freq: Once | INTRAMUSCULAR | Status: AC
  Administered 2019-01-18: 100 mg via INTRAVENOUS
  Filled 2019-01-18: qty 2

## 2019-01-18 MED ORDER — LIDOCAINE HCL 1 % IJ SOLN
INTRAMUSCULAR | Status: AC
  Filled 2019-01-18: qty 40

## 2019-01-18 MED ORDER — FENTANYL CITRATE (PF) 100 MCG/2ML IJ SOLN
50.0000 ug | Freq: Once | INTRAMUSCULAR | Status: AC
  Administered 2019-01-18: 05:00:00 50 ug via INTRAVENOUS
  Filled 2019-01-18: qty 2

## 2019-01-18 MED ORDER — LORAZEPAM 1 MG PO TABS: 1.0000 mg | ORAL_TABLET | Freq: Four times a day (QID) | ORAL | Status: AC | PRN

## 2019-01-18 MED ORDER — SODIUM CHLORIDE 0.9% IV SOLUTION: Freq: Once | INTRAVENOUS | Status: DC

## 2019-01-18 MED ORDER — ONDANSETRON HCL 4 MG PO TABS: 4.0000 mg | ORAL_TABLET | Freq: Four times a day (QID) | ORAL | Status: DC | PRN

## 2019-01-18 MED ORDER — SODIUM CHLORIDE (PF) 0.9 % IJ SOLN
INTRAMUSCULAR | Status: AC
  Filled 2019-01-18: qty 50

## 2019-01-18 MED ORDER — THIAMINE HCL 100 MG/ML IJ SOLN: 100.0000 mg | Freq: Every day | INTRAMUSCULAR | Status: DC

## 2019-01-18 MED ORDER — FENTANYL CITRATE (PF) 100 MCG/2ML IJ SOLN
50.0000 ug | Freq: Once | INTRAMUSCULAR | Status: AC
  Administered 2019-01-18: 01:00:00 50 ug via INTRAVENOUS
  Filled 2019-01-18: qty 2

## 2019-01-18 MED ORDER — BISOPROLOL FUMARATE 5 MG PO TABS
5.0000 mg | ORAL_TABLET | Freq: Every day | ORAL | Status: DC
  Administered 2019-01-19 – 2019-01-22 (×4): 5 mg via ORAL
  Filled 2019-01-18 (×4): qty 1

## 2019-01-18 MED ORDER — LIDOCAINE HCL 1 % IJ SOLN
INTRAMUSCULAR | Status: AC
  Filled 2019-01-18: qty 10

## 2019-01-18 MED ORDER — VITAMIN B-1 100 MG PO TABS
100.0000 mg | ORAL_TABLET | Freq: Every day | ORAL | Status: DC
  Administered 2019-01-18 – 2019-01-21 (×4): 100 mg via ORAL
  Filled 2019-01-18 (×5): qty 1

## 2019-01-18 MED ORDER — ACETAMINOPHEN 650 MG RE SUPP: 650.0000 mg | Freq: Four times a day (QID) | RECTAL | Status: DC | PRN

## 2019-01-18 MED ORDER — ALBUMIN HUMAN 25 % IV SOLN
25.0000 g | INTRAVENOUS | Status: AC
  Administered 2019-01-18 (×2): 25 g via INTRAVENOUS
  Filled 2019-01-18 (×2): qty 100

## 2019-01-18 MED ORDER — ACETAMINOPHEN 325 MG PO TABS: 650.0000 mg | ORAL_TABLET | Freq: Four times a day (QID) | ORAL | Status: DC | PRN

## 2019-01-18 MED ORDER — ONDANSETRON HCL 4 MG/2ML IJ SOLN
4.0000 mg | Freq: Once | INTRAMUSCULAR | Status: AC
  Administered 2019-01-18: 01:00:00 4 mg via INTRAVENOUS
  Filled 2019-01-18: qty 2

## 2019-01-18 MED ORDER — HYDRALAZINE HCL 20 MG/ML IJ SOLN: 10.0000 mg | INTRAMUSCULAR | Status: DC | PRN

## 2019-01-18 MED ORDER — FENTANYL CITRATE (PF) 100 MCG/2ML IJ SOLN
25.0000 ug | INTRAMUSCULAR | Status: DC | PRN
  Administered 2019-01-18 – 2019-01-21 (×15): 25 ug via INTRAVENOUS
  Filled 2019-01-18 (×14): qty 2

## 2019-01-18 MED ORDER — SODIUM CHLORIDE 0.9 % IV SOLN
2.0000 g | Freq: Every day | INTRAVENOUS | Status: DC
  Administered 2019-01-18 – 2019-01-20 (×3): 2 g via INTRAVENOUS
  Filled 2019-01-18 (×2): qty 2
  Filled 2019-01-18: qty 20

## 2019-01-18 MED ORDER — LORAZEPAM 2 MG/ML IJ SOLN: 1.0000 mg | Freq: Four times a day (QID) | INTRAMUSCULAR | Status: AC | PRN

## 2019-01-18 MED ORDER — ONDANSETRON HCL 4 MG/2ML IJ SOLN: 4.0000 mg | Freq: Four times a day (QID) | INTRAMUSCULAR | Status: DC | PRN

## 2019-01-18 MED ORDER — SODIUM CHLORIDE 0.9 % IV SOLN
10.0000 mL/h | Freq: Once | INTRAVENOUS | Status: AC
  Administered 2019-01-18: 10 mL/h via INTRAVENOUS

## 2019-01-18 MED ORDER — FENTANYL CITRATE (PF) 100 MCG/2ML IJ SOLN
INTRAMUSCULAR | Status: AC
  Administered 2019-01-18: 15:00:00 25 ug via INTRAVENOUS
  Filled 2019-01-18: qty 2

## 2019-01-18 MED ORDER — IOHEXOL 300 MG/ML  SOLN
100.0000 mL | Freq: Once | INTRAMUSCULAR | Status: AC | PRN
  Administered 2019-01-18: 100 mL via INTRAVENOUS

## 2019-01-18 MED ORDER — SPIRONOLACTONE 25 MG PO TABS
50.0000 mg | ORAL_TABLET | Freq: Every day | ORAL | Status: DC
  Administered 2019-01-19 – 2019-01-20 (×2): 50 mg via ORAL
  Filled 2019-01-18 (×2): qty 2

## 2019-01-18 MED ORDER — FOLIC ACID 1 MG PO TABS
1.0000 mg | ORAL_TABLET | Freq: Every day | ORAL | Status: DC
  Administered 2019-01-18 – 2019-01-22 (×5): 1 mg via ORAL
  Filled 2019-01-18 (×5): qty 1

## 2019-01-18 MED ORDER — PEG 3350-KCL-NA BICARB-NACL 420 G PO SOLR
4000.0000 mL | Freq: Once | ORAL | Status: AC
  Administered 2019-01-18: 4000 mL via ORAL

## 2019-01-18 MED ORDER — FUROSEMIDE 20 MG PO TABS
20.0000 mg | ORAL_TABLET | Freq: Every day | ORAL | Status: DC
  Administered 2019-01-19 – 2019-01-20 (×2): 20 mg via ORAL
  Filled 2019-01-18 (×2): qty 1

## 2019-01-18 MED ORDER — ADULT MULTIVITAMIN W/MINERALS CH
1.0000 | ORAL_TABLET | Freq: Every day | ORAL | Status: DC
  Administered 2019-01-18 – 2019-01-22 (×5): 1 via ORAL
  Filled 2019-01-18 (×6): qty 1

## 2019-01-18 NOTE — Procedures (Signed)
Ultrasound-guided diagnostic and therapeutic paracentesis performed yielding 7.4 liters of yellow fluid. No immediate complications. A portion of the fluid was sent to the lab for preordered studies. EBL none.

## 2019-01-18 NOTE — ED Provider Notes (Addendum)
TIME SEEN: 1:06 AM  CHIEF COMPLAINT: Abdominal pain and distention  HPI: Patient is a 53 year old male with history of alcohol abuse who presents to the emergency department with several days of abdominal distention, shortness of breath, nausea and vomiting.  Denies any diarrhea.  No fevers, chest pain, cough.  States he drinks "a lot" of liquor every day.  He has never had a known diagnosis of liver failure, cirrhosis or had a paracentesis before.  He has had 3 previous abdominal hernia repairs.  He states his last alcoholic beverage was 72 hours ago.  No history of DTs or withdrawal seizures.  Denies any bloody stool or melena.  He is not on any blood thinners.  States he thinks he is short of breath because his abdomen is distended.  ROS: See HPI Constitutional: no fever  Eyes: no drainage  ENT: no runny nose   Cardiovascular:  no chest pain  Resp:  SOB  GI:  vomiting GU: no dysuria Integumentary: no rash  Allergy: no hives  Musculoskeletal: no leg swelling  Neurological: no slurred speech ROS otherwise negative  PAST MEDICAL HISTORY/PAST SURGICAL HISTORY:  Past Medical History:  Diagnosis Date  . Anxiety   . Depression   . Diabetes mellitus without complication ([place])   . GERD (gastroesophageal reflux disease)   . Gout   . Hyperlipidemia   . Hypertension   . Substance abuse ([place])    alcoholism     MEDICATIONS:  Prior to Admission medications   Medication Sig Start Date End Date Taking? Authorizing Provider  bisoprolol-hydrochlorothiazide (ZIAC) 10-6.25 MG tablet Take 1 tablet by mouth 2 (two) times daily. 08/16/17   [name], MD  sertraline (ZOLOFT) 50 MG tablet Take 1 tablet (50 mg total) by mouth daily. 10/15/17   [name], MD    ALLERGIES:  No Known Allergies  SOCIAL HISTORY:  Social History   Tobacco Use  . Smoking status: Former Smoker    Quit date: 08/11/2001    Years since quitting: 17.4  . Smokeless tobacco: Never Used  Substance Use Topics  .  Alcohol use: Yes    Alcohol/week: 2.0 standard drinks    Types: 2 Standard drinks or equivalent per week    FAMILY HISTORY: Family History  Problem Relation Age of Onset  . Hypertension Mother   . Diabetes Father   . Heart attack Father   . Pneumonia Father   . Hypertension Father   . Diabetes Maternal Grandfather     EXAM: BP (!) 160/92   Pulse 82   Temp 99.1 F (37.3 C) (Oral)   Resp (!) 22   SpO2 100%  CONSTITUTIONAL: Alert and oriented and responds appropriately to questions.  Chronically ill-appearing HEAD: Normocephalic EYES: Conjunctivae clear, pupils appear equal, EOMI ENT: normal nose; moist mucous membranes NECK: Supple, no meningismus, no nuchal rigidity, no LAD  CARD: RRR; S1 and S2 appreciated; no murmurs, no clicks, no rubs, no gallops RESP: Normal chest excursion without splinting or tachypnea; breath sounds clear and equal bilaterally; no wheezes, no rhonchi, no rales, no hypoxia or respiratory distress, speaking full sentences ABD/GI: Normal bowel sounds; distended.  No tympany.  No appreciable fluid wave.  No guarding or rebound.  Mildly tender to palpation diffusely.  No peritoneal signs. RECTAL:  Normal rectal tone, no gross blood or melena, guaiac negative, no hemorrhoids appreciated, nontender rectal exam, no fecal impaction BACK:  The back appears normal and is non-tender to palpation, there is no CVA tenderness  EXT: Normal ROM in all joints; non-tender to palpation; no edema; normal capillary refill; no cyanosis, no calf tenderness or swelling    SKIN: Normal color for age and race; warm; no rash NEURO: Moves all extremities equally PSYCH: The patient's mood and manner are appropriate. Grooming and personal hygiene are appropriate.  MEDICAL DECISION MAKING: Patient here with complaints of abdominal distention, pain, vomiting.  I am concerned that this is secondary to liver failure from his history of alcohol abuse.  Bowel obstruction also on the  differential.  Labs obtained in triage show that patient is anemic with a hemoglobin of 6.5.  His rectal exam shows a normal brown appearing stool that is guaiac negative.  We have discussed risk and benefits of transfusion.  Will give 1 unit of packed red blood cells here.  Other labs, CT of the abdomen pelvis, chest x-ray pending.  EKG shows new ischemic change.  Doubt ACS, PE or dissection.  ED PROGRESS: Patient's electrolytes within normal limits.  He is not coagulopathic or thrombocytopenic.  Chest x-ray is clear.  His CT scan shows a large enhancing mass lesion within the ascending colon consistent with colonic neoplasm.  He has significant ascites throughout the abdomen with increased density within the omentum suggestive of peritoneal metastatic disease.  I discussed these findings with patient.  We have recommended admission.  He agrees to this plan.  I have low suspicion that patient has SBP today.  He has a very benign abdominal exam.  We did discuss that he may need a paracentesis while in the hospital.  He verbalized understanding.  Discussed patient's case with hospitalist, Dr. Hal Hope.  I have recommended admission and patient (and family if present) agree with this plan. Admitting physician will place admission orders.   I reviewed all nursing notes, vitals, pertinent previous records, EKGs, lab and urine results, imaging (as available).       EKG Interpretation  Date/Time:  Wednesday January 18 2019 00:38:35 EDT Ventricular Rate:  78 PR Interval:    QRS Duration: 94 QT Interval:  392 QTC Calculation: 447 R Axis:   54 Text Interpretation:  Sinus rhythm Ventricular premature complex Inferior infarct, old Anteroseptal infarct, age indeterminate nonspecific T wave changes new compared to prior Confirmed by Ward, Cyril Mourning [phone]) on 01/18/2019 1:06:05 AM        CRITICAL CARE Performed by: Cyril Mourning Ward   Total critical care time: 65 minutes  Critical care time was  exclusive of separately billable procedures and treating other patients.  Critical care was necessary to treat or prevent imminent or life-threatening deterioration.  Critical care was time spent personally by me on the following activities: development of treatment plan with patient and/or surrogate as well as nursing, discussions with consultants, evaluation of patient's response to treatment, examination of patient, obtaining history from patient or surrogate, ordering and performing treatments and interventions, ordering and review of laboratory studies, ordering and review of radiographic studies, pulse oximetry and re-evaluation of patient's condition.    Ward, Delice Bison, DO 01/18/19 [place], Delice Bison, DO 01/18/19 [phone]

## 2019-01-18 NOTE — H&P (Signed)
History and Physical    Martin Anthony RWE:[id number] DOB: 1966-05-01 DOA: 01/18/2019  PCP: [name], MD  Patient coming from: Home.  Chief Complaint: Abdominal pain.  HPI: Martin Anthony is a 53 y.o. male with history of hypertension, depression and GERD presents to the ER because of worsening abdominal pain and distention.  Patient states his pain is started about 2 weeks ago but the last 2 days it has acutely worsened with abdominal distention worsening and has not eaten well last 2 days because of the worsening abdominal pain but abdominal pain is mostly in the right upper quadrant.  Has noticed some blood in the stools off and on and has lost 40 pounds weight in the last 1 year.  Drinks alcohol every day.  ED Course: In the ER on exam patient has distended abdomen and labs show hemoglobin of 6.5 microcytic hypochromic picture WBC 3.9 platelets 217 glucose 126 creatinine 1.8 albumin 3 total bili 1.9 INR 1.3 stool for occult blood was negative.  COVID-19 test is pending.  EKG shows normal sinus rhythm.  Given the abdominal pain patient had CT abdomen pelvis done which shows enhancing ascending colon mass concerning for colonic neoplasm with large amount of ascites and peritoneal carcinomatosis and possible cirrhosis with esophageal varices.  Patient admitted for further management.  1 unit of PRBC transfusion was ordered.    Review of Systems: As per HPI, rest all negative.   Past Medical History:  Diagnosis Date  . Anxiety   . Depression   . Diabetes mellitus without complication ([place])   . GERD (gastroesophageal reflux disease)   . Gout   . Hyperlipidemia   . Hypertension   . Substance abuse ([place])    alcoholism     Past Surgical History:  Procedure Laterality Date  . HERNIA REPAIR     umbilical   . INGUINAL HERNIA REPAIR Bilateral      reports that he quit smoking about 17 years ago. He has never used smokeless tobacco. He reports current alcohol use of about 2.0 standard  drinks of alcohol per week. He reports that he does not use drugs.  No Known Allergies  Family History  Problem Relation Age of Onset  . Hypertension Mother   . Diabetes Father   . Heart attack Father   . Pneumonia Father   . Hypertension Father   . Diabetes Maternal Grandfather     Prior to Admission medications   Medication Sig Start Date End Date Taking? Authorizing Provider  bisoprolol-hydrochlorothiazide (ZIAC) 10-6.25 MG tablet Take 1 tablet by mouth 2 (two) times daily. 08/16/17  Yes [name], MD  omeprazole (PRILOSEC OTC) 20 MG tablet Take 20 mg by mouth daily.   Yes [provider]  sertraline (ZOLOFT) 50 MG tablet Take 1 tablet (50 mg total) by mouth daily. 10/15/17  Yes [name], MD    Physical Exam: Constitutional: Moderately built and nourished. Vitals:   01/18/19 0130 01/18/19 0300 01/18/19 0330 01/18/19 0400  BP: (!) 159/87 (!) 134/100 (!) 150/135 (!) 147/91  Pulse: 81 74 77 82  Resp: (!) 23 (!) 22 18 20   Temp:      TempSrc:      SpO2: 100% 98% 98% 98%   Eyes: Anicteric mild pallor. ENMT: No discharge from the ears eyes nose or mouth. Neck: No mass or.  No neck rigidity. Respiratory: No rhonchi or crepitations. Cardiovascular: S1-S2 heard. Abdomen: Distended no guarding or rigidity.  Bowel sounds  present. Musculoskeletal: No edema. Skin: No rash. Neurologic: Alert awake oriented to time place and person.  Moves all extremities. Psychiatric: Appears normal per normal affect.   Labs on Admission: I have personally reviewed following labs and imaging studies  CBC: Recent Labs  Lab 01/18/19 0030  WBC 3.9*  HGB 6.5*  HCT 24.4*  MCV 78.0*  PLT 235   Basic Metabolic Panel: Recent Labs  Lab 01/18/19 0030 01/18/19 0103  NA 135  --   K 3.6  --   CL 105  --   CO2 20*  --   GLUCOSE 126*  --   BUN 10  --   CREATININE 0.98  --   CALCIUM 8.5*  --   MG  --  2.1   GFR: CrCl cannot be calculated (Unknown ideal weight.). Liver  Function Tests: Recent Labs  Lab 01/18/19 0030  AST 41  ALT 14  ALKPHOS 118  BILITOT 1.9*  PROT 7.8  ALBUMIN 3.0*   Recent Labs  Lab 01/18/19 0030  LIPASE 23   No results for input(s): AMMONIA in the last 168 hours. Coagulation Profile: Recent Labs  Lab 01/18/19 0103  INR 1.3*   Cardiac Enzymes: No results for input(s): CKTOTAL, CKMB, CKMBINDEX, TROPONINI in the last 168 hours. BNP (last 3 results) No results for input(s): PROBNP in the last 8760 hours. HbA1C: No results for input(s): HGBA1C in the last 72 hours. CBG: No results for input(s): GLUCAP in the last 168 hours. Lipid Profile: No results for input(s): CHOL, HDL, LDLCALC, TRIG, CHOLHDL, LDLDIRECT in the last 72 hours. Thyroid Function Tests: No results for input(s): TSH, T4TOTAL, FREET4, T3FREE, THYROIDAB in the last 72 hours. Anemia Panel: No results for input(s): VITAMINB12, FOLATE, FERRITIN, TIBC, IRON, RETICCTPCT in the last 72 hours. Urine analysis:    Component Value Date/Time   BILIRUBINUR 2+ 05/17/2015 1013   PROTEINUR 2+ 05/17/2015 1013   UROBILINOGEN 0.2 05/17/2015 1013   NITRITE n 05/17/2015 1013   LEUKOCYTESUR Negative 05/17/2015 1013   Sepsis Labs: @LABRCNTIP (procalcitonin:4,lacticidven:4) )No results found for this or any previous visit (from the past 240 hour(s)).   Radiological Exams on Admission: Ct Abdomen Pelvis W Contrast  Result Date: 01/18/2019 CLINICAL DATA:  Abdominal pain and distension EXAM: CT ABDOMEN AND PELVIS WITH CONTRAST TECHNIQUE: Multidetector CT imaging of the abdomen and pelvis was performed using the standard protocol following bolus administration of intravenous contrast. CONTRAST:  181mL OMNIPAQUE IOHEXOL 300 MG/ML  SOLN COMPARISON:  None. FINDINGS: Lower chest: No acute abnormality. Hepatobiliary: Liver is diffusely decreased in attenuation consistent with fatty infiltration. Mild heterogeneity is noted without definitive mass. The gallbladder is decompressed.  Pancreas: Unremarkable. No pancreatic ductal dilatation or surrounding inflammatory changes. Spleen: Normal in size without focal abnormality. Adrenals/Urinary Tract: The adrenal glands are within normal limits. The kidneys demonstrate a normal enhancement pattern. Tiny nonobstructing left renal stone is noted. Normal excretion of contrast material is noted bilaterally. Bladder is partially distended. Stomach/Bowel: The appendix is within normal limits. The colon is predominately decompressed. There is an enhancing lesion identified in the ascending colon just above the cecum which measures approximately 6 x 4.4 cm in greatest AP and transverse dimensions respectively. It extends for approximately 5.8 cm in craniocaudad projection. More distal colon appears within normal limits. No small bowel obstructive changes are seen. The stomach is within normal limits. Mild esophageal varices are seen. Vascular/Lymphatic: Aortic atherosclerosis. No enlarged abdominal or pelvic lymph nodes. Reproductive: Prostate is unremarkable. Other: Significant ascites is noted within  the abdomen. Mild increased density is noted within the omentum which may be related to the underlying ascites although the possibility of peritoneal disease could not be totally excluded given colonic mass. Musculoskeletal: No acute or significant osseous findings. IMPRESSION: Large enhancing mass lesion within the ascending colon consistent with colonic neoplasm till proven otherwise. Significant ascites throughout the abdomen with some increased density within the omentum suggestive of peritoneal metastatic disease. This may simply be related to the underlying ascites. Heterogeneity of the liver without definitive mass. Some mild esophageal varices are seen and these changes are likely related to underlying cirrhosis. Tiny nonobstructing left renal stone. Electronically Signed   By: Inez Catalina M.D.   On: 01/18/2019 02:22   Dg Chest Portable 1 View   Result Date: 01/18/2019 CLINICAL DATA:  Palpitations, shortness of breath EXAM: PORTABLE CHEST 1 VIEW COMPARISON:  08/28/2010 FINDINGS: Heart and mediastinal contours are within normal limits. No focal opacities or effusions. No acute bony abnormality. IMPRESSION: No active disease. Electronically Signed   By: Rolm Baptise M.D.   On: 01/18/2019 01:56    EKG: Independently reviewed.  Normal sinus rhythm VPCs.  Assessment/Plan Principal Problem:   Abdominal pain Active Problems:   Essential hypertension   GERD   Alcohol abuse   Anemia   Colonic mass    1. Abdominal pain likely from possible peritoneal carcinomatosis with large ascites -will likely need paracentesis in the morning.  We will keep patient on empiric antibiotics until then.  Features concerning for peritoneal carcinomatosis.  See #2. 2. Enhancing ascending colonic mass concerning for colonic neoplasm -consult gastroenterologist in the morning for possible colonoscopy. 3. Microcytic hypochromic anemia -1 unit of PRBC transfusion was ordered.  Check anemia panel.  Follow CBC.  Anemia could be related to patient's colonic mass concerning for neoplasm. 4. Leukopenia could be from alcoholism. 5. Alcohol abuse on CIWA protocol. 6. Hypertension we will continue bisoprolol PRN IV hydralazine for now and hold hydrochlorothiazide. 7. History of GERD on PPI.  Probably will need EGD also at this time. 8. History of depression.  No suicidal ideation. 9. Hyperglycemia will check hemoglobin A1c with next blood draw.  Given that patient has severe anemia with a chronic mass which will need further work-up patient will be admitted as inpatient.   DVT prophylaxis: SCDs. Code Status: Full code. Family Communication: Discussed with patient. Disposition Plan: Home. Consults called: None. Admission status: Inpatient.   Rise Patience MD [facility] Pager [phone].  If 7PM-7AM, please contact night-coverage [email or web address]  Password [email or web address]  01/18/2019, 4:19 AM

## 2019-01-18 NOTE — ED Notes (Signed)
Gave report to Maudie Mercury, Therapist, sports for [phone].

## 2019-01-18 NOTE — ED Notes (Signed)
Repositioned patient for comfort measures.

## 2019-01-18 NOTE — ED Notes (Signed)
CRITICAL VALUE STICKER  CRITICAL VALUE: HGB 6.5  RECEIVER (on-site recipient of call): Skarlette Lattner R  DATE & TIME NOTIFIED: 0100 01/18/19  MESSENGER (representative from lab):  MD NOTIFIED: Sam PA  TIME OF NOTIFICATION: 0104

## 2019-01-18 NOTE — Progress Notes (Signed)
PHARMACY NOTE:  ANTIMICROBIAL DOSAGE ADJUSTMENT  Current antimicrobial regimen includes a mismatch between antimicrobial dosage and indication.  As per policy approved by the Pharmacy & Therapeutics and Medical Executive Committees, the antimicrobial dosage will be adjusted accordingly.  Current antimicrobial dosage:  Rocephin 1 Gm IV q24h  Indication: possible SBP  Renal Function:  CrCl cannot be calculated (Unknown ideal weight.). []      On intermittent HD, scheduled: []      On CRRT    Antimicrobial dosage has been changed to:  Rocephin 2 Gm IV q24h    Thank you for allowing pharmacy to be a part of this patient's care.  Dorrene German, [facility] 01/18/2019 5:18 AM

## 2019-01-18 NOTE — Progress Notes (Signed)
PROGRESS NOTE  Martin Anthony TFT:[id number] DOB: [birth year]   PCP: [name], MD  Patient is from: Home  DOA: 01/18/2019 LOS: 0  Brief Narrative / Interim history: 53 year old male with history of A. fib not on medication, HTN, depression, alcohol use disorder and GERD presenting with worsening abdominal pain distention for 2 weeks, and 40 pound weight loss in 1 year.   In ED, Hgb 6.5.  Creatinine 0.98.  Total bili 1.9.  INR 1.3.  FOBT negative.  EKG normal sinus rhythm.  CT abdomen and pelvis revealed enhancing ascending colon mass concerning for colonic neoplasm with large amount of ascites and peritoneal carcinomatosis and possible cirrhosis with mild esophageal varices.   Subjective: Hemoglobin dropped further to 5.6.  Abdominal pain improved with pain medications.  Reports drinking about 6 drinks of whiskey daily.  Last drink was 5 days ago.  No personal or family history of cancer.  Never had colonoscopy.  Denies hematochezia or melena.  Denies chest pain or dyspnea.  Objective: Vitals:   01/18/19 0730 01/18/19 0800 01/18/19 0930 01/18/19 0949  BP: (!) 144/92 (!) 141/84 (!) 150/94 (!) 150/88  Pulse: 76 75 72 74  Resp: (!) 25 18 (!) 22 18  Temp:   98.8 F (37.1 C) 98.6 F (37 C)  TempSrc:   Oral Oral  SpO2: 99% 98% 99%     Intake/Output Summary (Last 24 hours) at 01/18/2019 1035 Last data filed at 01/18/2019 0949 Gross per 24 hour  Intake 945 ml  Output -  Net 945 ml   There were no vitals filed for this visit.  Examination:  GENERAL: No acute distress.  Appears well.  HEENT: MMM.  Vision and hearing grossly intact.  NECK: Supple.  No apparent JVD.  RESP:  No IWOB. Good air movement bilaterally. CVS:  RRR. Heart sounds normal.  ABD/GI/GU: Bowel sounds present.  Distended abdomen.  No significant tenderness. MSK/EXT:  Moves extremities. No apparent deformity or edema.  SKIN: no apparent skin lesion or wound NEURO: Awake, alert and oriented appropriately.  No gross  deficit.  PSYCH: Calm. Normal affect.   I have personally reviewed the following labs and images:  Radiology Studies: Ct Abdomen Pelvis W Contrast  Result Date: 01/18/2019 CLINICAL DATA:  Abdominal pain and distension EXAM: CT ABDOMEN AND PELVIS WITH CONTRAST TECHNIQUE: Multidetector CT imaging of the abdomen and pelvis was performed using the standard protocol following bolus administration of intravenous contrast. CONTRAST:  172mL OMNIPAQUE IOHEXOL 300 MG/ML  SOLN COMPARISON:  None. FINDINGS: Lower chest: No acute abnormality. Hepatobiliary: Liver is diffusely decreased in attenuation consistent with fatty infiltration. Mild heterogeneity is noted without definitive mass. The gallbladder is decompressed. Pancreas: Unremarkable. No pancreatic ductal dilatation or surrounding inflammatory changes. Spleen: Normal in size without focal abnormality. Adrenals/Urinary Tract: The adrenal glands are within normal limits. The kidneys demonstrate a normal enhancement pattern. Tiny nonobstructing left renal stone is noted. Normal excretion of contrast material is noted bilaterally. Bladder is partially distended. Stomach/Bowel: The appendix is within normal limits. The colon is predominately decompressed. There is an enhancing lesion identified in the ascending colon just above the cecum which measures approximately 6 x 4.4 cm in greatest AP and transverse dimensions respectively. It extends for approximately 5.8 cm in craniocaudad projection. More distal colon appears within normal limits. No small bowel obstructive changes are seen. The stomach is within normal limits. Mild esophageal varices are seen. Vascular/Lymphatic: Aortic atherosclerosis. No enlarged abdominal or pelvic lymph nodes. Reproductive: Prostate is unremarkable.  Other: Significant ascites is noted within the abdomen. Mild increased density is noted within the omentum which may be related to the underlying ascites although the possibility of  peritoneal disease could not be totally excluded given colonic mass. Musculoskeletal: No acute or significant osseous findings. IMPRESSION: Large enhancing mass lesion within the ascending colon consistent with colonic neoplasm till proven otherwise. Significant ascites throughout the abdomen with some increased density within the omentum suggestive of peritoneal metastatic disease. This may simply be related to the underlying ascites. Heterogeneity of the liver without definitive mass. Some mild esophageal varices are seen and these changes are likely related to underlying cirrhosis. Tiny nonobstructing left renal stone. Electronically Signed   By: Inez Catalina M.D.   On: 01/18/2019 02:22   Dg Chest Portable 1 View  Result Date: 01/18/2019 CLINICAL DATA:  Palpitations, shortness of breath EXAM: PORTABLE CHEST 1 VIEW COMPARISON:  08/28/2010 FINDINGS: Heart and mediastinal contours are within normal limits. No focal opacities or effusions. No acute bony abnormality. IMPRESSION: No active disease. Electronically Signed   By: Rolm Baptise M.D.   On: 01/18/2019 01:56    Microbiology: No results found for this or any previous visit (from the past 240 hour(s)).  Sepsis Labs: Invalid input(s): PROCALCITONIN, LACTICIDVEN  Urine analysis:    Component Value Date/Time   COLORURINE YELLOW 01/18/2019 [place] 01/18/2019 0435   LABSPEC >1.046 (H) 01/18/2019 0435   PHURINE 5.0 01/18/2019 [place] 01/18/2019 0435   HGBUR NEGATIVE 01/18/2019 0435   BILIRUBINUR NEGATIVE 01/18/2019 0435   BILIRUBINUR 2+ 05/17/2015 1013   KETONESUR 5 (A) 01/18/2019 0435   PROTEINUR NEGATIVE 01/18/2019 0435   UROBILINOGEN 0.2 05/17/2015 1013   NITRITE NEGATIVE 01/18/2019 0435   LEUKOCYTESUR NEGATIVE 01/18/2019 0435    Anemia Panel: No results for input(s): VITAMINB12, FOLATE, FERRITIN, TIBC, IRON, RETICCTPCT in the last 72 hours.  Thyroid Function Tests: No results for input(s): TSH,  T4TOTAL, FREET4, T3FREE, THYROIDAB in the last 72 hours.  Lipid Profile: No results for input(s): CHOL, HDL, LDLCALC, TRIG, CHOLHDL, LDLDIRECT in the last 72 hours.  CBG: No results for input(s): GLUCAP in the last 168 hours.  HbA1C: No results for input(s): HGBA1C in the last 72 hours.  BNP (last 3 results): No results for input(s): PROBNP in the last 8760 hours.  Cardiac Enzymes: No results for input(s): CKTOTAL, CKMB, CKMBINDEX, TROPONINI in the last 168 hours.  Coagulation Profile: Recent Labs  Lab 01/18/19 0103  INR 1.3*    Liver Function Tests: Recent Labs  Lab 01/18/19 0030  AST 41  ALT 14  ALKPHOS 118  BILITOT 1.9*  PROT 7.8  ALBUMIN 3.0*   Recent Labs  Lab 01/18/19 0030  LIPASE 23   No results for input(s): AMMONIA in the last 168 hours.  Basic Metabolic Panel: Recent Labs  Lab 01/18/19 0030 01/18/19 0103  NA 135  --   K 3.6  --   CL 105  --   CO2 20*  --   GLUCOSE 126*  --   BUN 10  --   CREATININE 0.98  --   CALCIUM 8.5*  --   MG  --  2.1   GFR: CrCl cannot be calculated (Unknown ideal weight.).  CBC: Recent Labs  Lab 01/18/19 0030 01/18/19 0525  WBC 3.9* 3.4*  NEUTROABS  --  2.0  HGB 6.5* 5.6*  HCT 24.4* 21.0*  MCV 78.0* 78.1*  PLT 217 162    Procedures:  None  Microbiology summarized: [id number] pending.  Assessment & Plan: Abdominal pain/distention: Likely due to large ascites from cirrhosis and possible peritoneal carcinomatosis -Therapeutic and diagnostic paracentesis today. -Continue prophylactic antibiotics with ceftriaxone.  -PRN fentanyl and antiemetics.  Liver cirrhosis with esophageal varices: Likely due to alcohol. -Paracentesis as above -Acute hepatitis panel and HIV  Ascending colon mass: concerning for neoplasm.  Patient reports unintentional weight loss of 40 pounds in the last 1 year. -[facility], Dr. Collene Mares consulted.  Microcytic hypochromic anemia/iron deficiency anemia: Likely due to alcohol and  possibly due to liver cirrhosis.  Hemoglobin dropped from 6.5-5.6 this morning.  Likely dilutional from IV fluid.  Patient denies hematochezia or melena.  FOBT negative. -Hgb 6.5> 5.6> 2 units -Check anemia panel -Monitor H&H.  Unintentional weight loss: Likely combination of heavy alcohol use and possible malignancy. -Consult dietitian  Alcohol use disorder: Last drink 5 days prior to admission. -CIWA protocol with Ativan  Hyperbilirubinemia: Likely due to liver failure/cirrhosis -Continue trending  History of GERD: -PPI  Paroxysmal A. fib: Currently normal sinus rhythm.  Not on medications -Continue bisoprolol.  Hypertension: Normotensive -Continue bisoprolol and PRN hydralazine.  Hyperglycemia: -Check A1c.  History of depression: Stable.  Denies SI or HI. -Continue home meds  DVT prophylaxis: SCD Code Status: Full code Family Communication: Patient and/or RN. Available if any question.  Disposition Plan: Remains inpatient Consultants: Gastroenterology   Antimicrobials: Anti-infectives (From admission, onward)   Start     Dose/Rate Route Frequency Ordered Stop   01/18/19 0430  cefTRIAXone (ROCEPHIN) 2 g in sodium chloride 0.9 % 100 mL IVPB     2 g 200 mL/hr over 30 Minutes Intravenous Daily 01/18/19 0420        Sch Meds:  Scheduled Meds: . sodium chloride   Intravenous Once  . bisoprolol  10 mg Oral Daily  . folic acid  1 mg Oral Daily  . lidocaine      . lidocaine      . multivitamin with minerals  1 tablet Oral Daily  . sodium chloride (PF)      . thiamine  100 mg Oral Daily   Or  . thiamine  100 mg Intravenous Daily   Continuous Infusions: . cefTRIAXone (ROCEPHIN)  IV Stopped (01/18/19 0606)   PRN Meds:.acetaminophen **OR** acetaminophen, fentaNYL (SUBLIMAZE) injection, hydrALAZINE, LORazepam **OR** LORazepam, ondansetron **OR** ondansetron (ZOFRAN) IV  35 minutes with more than 50% spent in reviewing records, counseling patient and coordinating  care.   T. Jewett  If 7PM-7AM, please contact night-coverage [email or web address] Password [email or web address] 01/18/2019, 10:35 AM

## 2019-01-18 NOTE — Consult Note (Signed)
UNASSIGNED PATIENT  Reason for Consult: Colonic mass on CT scan. Referring Physician: [facility] hospitalist  Martin Anthony is an 53 y.o. male.  HPI: [name] is a 53 year old white male with multiple medical problems listed below, who presented to the [facility] long emergency room with a 5-week history of worsening abdominal pain increasing abdominal girth and a 40 pound weight loss over the last 18 months.  Patient claims he lost his job at [employer] in April 2019 has not worked since then.  This is caused him to be seriously depressed.  He has a history of atrial fibrillation that has not been treated along with a history of depression hypertension reflux adult onset diabetes mellitus and longstanding history of alcoholism he gives a history of worsening of his symptoms about 5 months ago he start developing nausea and vomiting and was unable to keep anything down.  Shortly thereafter he developed diarrhea with 3-4 loose bowel movements per day.  He denies having any melena or hematochezia.  He has a longstanding history of reflux and takes over-the-counter omeprazole as needed for his symptoms.  Patient denies having a history of odynophagia or dysphagia.  He denies the use of OTC nonsteroidals. He is never had a colonoscopy even though he was advised by his PCP to have one about a year ago.  He has been having some periumbilical pain and epigastric pain that is worsened over the last few weeks prompting him to come to the emergency room he claims he has been under a lot of stress because of COVID as his helping his 44-year-old daughter with online classes as she is home from school.  He admits to drinking 5-7 shots of Crown Royal whiskey per night and sometimes more.  He denies use of illicit drugs but has used marijuana in the remote past.  There is no known family history of colitis or colon cancer.  On presentation to the emergency room he was found to have a large enhancing mass in the ascending colon  with omental caking suggestive of peritoneal carcinomatosis and massive ascites.  Mild esophageal varices also noted with possibility of underlying cirrhosis.  Hemoglobin was noted to be at 5.6 g/dL with albumin of 3.  GI consultation was procured for possible endoscopy tomorrow.  Past Medical History:  Diagnosis Date  . Anxiety   . Depression   . Diabetes mellitus without complication ([place])   . GERD (gastroesophageal reflux disease)   . Gout   . Hyperlipidemia   . Hypertension   . Substance abuse ([place])    alcoholism     Past Surgical History:  Procedure Laterality Date  . HERNIA REPAIR     umbilical   . INGUINAL HERNIA REPAIR Bilateral     Family History  Problem Relation Age of Onset  . Hypertension Mother   . Diabetes Father   . Heart attack Father   . Pneumonia Father   . Hypertension Father   . Diabetes Maternal Grandfather    Social History:  reports that he quit smoking about 17 years ago. He has never used smokeless tobacco. He reports current alcohol use of about 2.0 standard drinks of alcohol per week. He reports that he does not use drugs.  Allergies: No Known Allergies  Medications: I have reviewed the patient's current medications.  Results for orders placed or performed during the hospital encounter of 01/18/19 (from the past 48 hour(s))  Lipase, blood     Status: None   Collection  Time: 01/18/19 12:30 AM  Result Value Ref Range   Lipase 23 11 - 51 U/L    Comment: Performed at [facility], [place] [address], [place], [place] [address]  Comprehensive metabolic panel     Status: Abnormal   Collection Time: 01/18/19 12:30 AM  Result Value Ref Range   Sodium 135 135 - 145 mmol/L   Potassium 3.6 3.5 - 5.1 mmol/L   Chloride 105 98 - 111 mmol/L   CO2 20 (L) 22 - 32 mmol/L   Glucose, Bld 126 (H) 70 - 99 mg/dL   BUN 10 6 - 20 mg/dL   Creatinine, Ser 0.98 0.61 - 1.24 mg/dL   Calcium 8.5 (L) 8.9 - 10.3 mg/dL   Total Protein 7.8 6.5 - 8.1 g/dL    Albumin 3.0 (L) 3.5 - 5.0 g/dL   AST 41 15 - 41 U/L   ALT 14 0 - 44 U/L   Alkaline Phosphatase 118 38 - 126 U/L   Total Bilirubin 1.9 (H) 0.3 - 1.2 mg/dL   GFR calc non Af Amer >60 >60 mL/min   GFR calc Af Amer >60 >60 mL/min   Anion gap 10 5 - 15    Comment: Performed at [facility], [place] [address] [place], [place] [address]  CBC     Status: Abnormal   Collection Time: 01/18/19 12:30 AM  Result Value Ref Range   WBC 3.9 (L) 4.0 - 10.5 K/uL   RBC 3.13 (L) 4.22 - 5.81 MIL/uL   Hemoglobin 6.5 (LL) 13.0 - 17.0 g/dL    Comment: REPEATED TO VERIFY THIS CRITICAL RESULT HAS VERIFIED AND BEEN CALLED TO RN C [place]AV[place] BY ALEXIS [place] ON 08 05 2020 AT [id number], AND HAS BEEN READ BACK.     HCT 24.4 (L) 39.0 - 52.0 %   MCV 78.0 (L) 80.0 - 100.0 fL   MCH 20.8 (L) 26.0 - 34.0 pg   MCHC 26.6 (L) 30.0 - 36.0 g/dL   RDW 21.4 (H) 11.5 - 15.5 %   Platelets 217 150 - 400 K/uL   nRBC 0.0 0.0 - 0.2 %    Comment: Performed at [facility], [place] [address], [place], [place] [address]  Ethanol     Status: None   Collection Time: 01/18/19  1:03 AM  Result Value Ref Range   Alcohol, Ethyl (B) <10 <10 mg/dL    Comment: (NOTE) Lowest detectable limit for serum alcohol is 10 mg/dL. For medical purposes only. Performed at [facility], [place] [address] [place], [place] [address]   Troponin I (High Sensitivity)     Status: None   Collection Time: 01/18/19  1:03 AM  Result Value Ref Range   Troponin I (High Sensitivity) 5 <18 ng/L    Comment: (NOTE) Elevated high sensitivity troponin I (hsTnI) values and significant  changes across serial measurements may suggest ACS but many other  chronic and acute conditions are known to elevate hsTnI results.  Refer to the "Links" section for chest pain algorithms and additional  guidance. Performed at [facility], [place] [address] [place], [place] [address]   Protime-INR     Status:  Abnormal   Collection Time: 01/18/19  1:03 AM  Result Value Ref Range   Prothrombin Time 16.0 (H) 11.4 - 15.2 seconds   INR 1.3 (H) 0.8 - 1.2    Comment: (NOTE) INR goal varies based on device and disease states. Performed at [facility], [place] [address], [place], [place] [address]   Magnesium     Status: None   Collection Time: 01/18/19  1:03 AM  Result Value Ref Range   Magnesium 2.1 1.7 - 2.4 mg/dL    Comment: Performed at [facility], [place] [address], [place], [place] [address]  SARS CORONAVIRUS 2 Nasal Swab Aptima Multi Swab     Status: None   Collection Time: 01/18/19  1:21 AM   Specimen: Aptima Multi Swab; Nasal Swab  Result Value Ref Range   SARS Coronavirus 2 NEGATIVE NEGATIVE    Comment: (NOTE) SARS-CoV-2 target nucleic acids are NOT DETECTED. The SARS-CoV-2 RNA is generally detectable in upper and lower respiratory specimens during the acute phase of infection. Negative results do not preclude SARS-CoV-2 infection, do not rule out co-infections with other pathogens, and should not be used as the sole basis for treatment or other patient management decisions. Negative results must be combined with clinical observations, patient history, and epidemiological information. The expected result is Negative. Fact Sheet for Patients: SugarRoll.be Fact Sheet for Healthcare Providers: https://www.woods-mathews.com/ This test is not yet approved or cleared by the [place] FDA and  has been authorized for detection and/or diagnosis of SARS-CoV-2 by FDA under an Emergency Use Authorization (EUA). This EUA will remain  in effect (meaning this test can be used) for the duration of the COVID-19 declaration under Section 56 4(b)(1) of the Act, 21 U.S.C. section 360bbb-3(b)(1), unless the authorization is terminated or revoked sooner. Performed at [facility], [place] [address] [place],  [place] [address]   Type and screen     Status: None (Preliminary result)   Collection Time: 01/18/19  1:34 AM  Result Value Ref Range   ABO/RH(D) O POS    Antibody Screen NEG    Sample Expiration 01/21/2019,2359    Unit Number [id number]    Blood Component Type RED CELLS,LR    Unit division 00    Status of Unit ISSUED    Transfusion Status OK TO TRANSFUSE    Crossmatch Result Compatible    Unit Number [id number]    Blood Component Type RED CELLS,LR    Unit division 00    Status of Unit ISSUED    Transfusion Status OK TO TRANSFUSE    Crossmatch Result      Compatible Performed at [facility], [place] [address], [place], [place] [address]    Unit Number [id number]    Blood Component Type RED CELLS,LR    Unit division 00    Status of Unit ALLOCATED    Transfusion Status OK TO TRANSFUSE    Crossmatch Result Compatible   Prepare RBC     Status: None   Collection Time: 01/18/19  1:34 AM  Result Value Ref Range   Order Confirmation      ORDER PROCESSED BY BLOOD BANK Performed at [facility], [place] [address], [place], [place] [address]   ABO/Rh     Status: None   Collection Time: 01/18/19  1:34 AM  Result Value Ref Range   ABO/RH(D)      O POS Performed at [facility], [place] [address] [place], [place] [address]   POC occult blood, ED     Status: None   Collection Time: 01/18/19  1:37 AM  Result Value Ref Range   Fecal Occult Bld NEGATIVE NEGATIVE  Urinalysis, Routine w reflex microscopic     Status: Abnormal   Collection Time:  01/18/19  4:35 AM  Result Value Ref Range   Color, Urine YELLOW YELLOW   APPearance CLEAR CLEAR   Specific Gravity, Urine >1.046 (H) 1.005 - 1.030   pH 5.0 5.0 - 8.0   Glucose, UA NEGATIVE NEGATIVE mg/dL   Hgb urine dipstick NEGATIVE NEGATIVE   Bilirubin Urine NEGATIVE NEGATIVE   Ketones, ur 5 (A) NEGATIVE mg/dL   Protein, ur NEGATIVE NEGATIVE mg/dL   Nitrite NEGATIVE NEGATIVE    Leukocytes,Ua NEGATIVE NEGATIVE    Comment: Performed at [place] [address], [place], [place] [address]  CBC WITH DIFFERENTIAL     Status: Abnormal   Collection Time: 01/18/19  5:25 AM  Result Value Ref Range   WBC 3.4 (L) 4.0 - 10.5 K/uL   RBC 2.69 (L) 4.22 - 5.81 MIL/uL   Hemoglobin 5.6 (LL) 13.0 - 17.0 g/dL   HCT 21.0 (L) 39.0 - 52.0 %   MCV 78.1 (L) 80.0 - 100.0 fL   MCH 20.8 (L) 26.0 - 34.0 pg   MCHC 26.7 (L) 30.0 - 36.0 g/dL   RDW 21.1 (H) 11.5 - 15.5 %   Platelets 162 150 - 400 K/uL   nRBC 0.0 0.0 - 0.2 %   Neutrophils Relative % 57 %   Neutro Abs 2.0 1.7 - 7.7 K/uL   Lymphocytes Relative 30 %   Lymphs Abs 1.0 0.7 - 4.0 K/uL   Monocytes Relative 9 %   Monocytes Absolute 0.3 0.1 - 1.0 K/uL   Eosinophils Relative 3 %   Eosinophils Absolute 0.1 0.0 - 0.5 K/uL   Basophils Relative 1 %   Basophils Absolute 0.0 0.0 - 0.1 K/uL   Immature Granulocytes 0 %   Abs Immature Granulocytes 0.01 0.00 - 0.07 K/uL   Polychromasia PRESENT    Ovalocytes PRESENT     Comment: Performed at [facility], [place] [address] [place], [place] [address]  Prepare RBC     Status: None   Collection Time: 01/18/19  8:30 AM  Result Value Ref Range   Order Confirmation      ORDER PROCESSED BY BLOOD BANK Performed at [place] [address], [place], [place] [address]   Vitamin B12     Status: None   Collection Time: 01/18/19 11:00 AM  Result Value Ref Range   Vitamin B-12 459 180 - 914 pg/mL    Comment: (NOTE) This assay is not validated for testing neonatal or myeloproliferative syndrome specimens for Vitamin B12 levels. Performed at [facility], [place] [address] [place], [place] [address]   Folate     Status: None   Collection Time: 01/18/19 11:00 AM  Result Value Ref Range   Folate 19.3 >5.9 ng/mL    Comment: Performed at [facility], [place] [address], [place], [place] [address]  Iron  and TIBC     Status: Abnormal   Collection Time: 01/18/19 11:00 AM  Result Value Ref Range   Iron 20 (L) 45 - 182 ug/dL   TIBC 311 250 - 450 ug/dL   Saturation Ratios 6 (L) 17.9 - 39.5 %   UIBC 291 ug/dL    Comment: Performed at [facility], [place] [address], [place], [place] [address]  Ferritin     Status: Abnormal   Collection Time: 01/18/19 11:00 AM  Result Value Ref Range   Ferritin 11 (L) 24 - 336 ng/mL    Comment: Performed at [facility], [address] [place], [place] [address]  Reticulocytes     Status: Abnormal   Collection Time: 01/18/19 11:00 AM  Result Value Ref Range   Retic Ct Pct 1.6 0.4 - 3.1 %   RBC. 2.98 (L) 4.22 - 5.81 MIL/uL   Retic Count, Absolute 46.5 19.0 - 186.0 K/uL   Immature Retic Fract 34.7 (H) 2.3 - 15.9 %    Comment: Performed at [facility], [place] [address], [place], [place] [address]  Hemoglobin A1c     Status: None   Collection Time: 01/18/19 11:00 AM  Result Value Ref Range   Hgb A1c MFr Bld 5.3 4.8 - 5.6 %    Comment: (NOTE) Pre diabetes:          5.7%-6.4% Diabetes:              >6.4% Glycemic control for   <7.0% adults with diabetes    Mean Plasma Glucose 105.41 mg/dL    Comment: Performed at [place] [address] [place], [place] [address]  Albumin, pleural or peritoneal fluid     Status: None   Collection Time: 01/18/19  3:45 PM  Result Value Ref Range   Albumin, Fluid 1.1 g/dL    Comment: (NOTE) No normal range established for this test Results should be evaluated in conjunction with serum values    Fluid Type-FALB Peritoneal     Comment: Performed at [facility], [place] [address], [place], [place] [address]  Protein, pleural or peritoneal fluid     Status: None   Collection Time: 01/18/19  3:45 PM  Result Value Ref Range   Total protein, fluid <3.0 g/dL   Fluid Type-FTP Peritoneal     Comment: Performed at [facility],  [place] [address], [place], [place] [address]  Glucose, pleural or peritoneal fluid     Status: None   Collection Time: 01/18/19  3:45 PM  Result Value Ref Range   Glucose, Fluid 106 mg/dL    Comment: (NOTE) No normal range established for this test Results should be evaluated in conjunction with serum values    Fluid Type-FGLU Peritoneal     Comment: Performed at [facility], [place] [address], [place], [place] [address]    Ct Abdomen Pelvis W Contrast  Result Date: 01/18/2019 CLINICAL DATA:  Abdominal pain and distension EXAM: CT ABDOMEN AND PELVIS WITH CONTRAST TECHNIQUE: Multidetector CT imaging of the abdomen and pelvis was performed using the standard protocol following bolus administration of intravenous contrast. CONTRAST:  147mL OMNIPAQUE IOHEXOL 300 MG/ML  SOLN COMPARISON:  None. FINDINGS: Lower chest: No acute abnormality. Hepatobiliary: Liver is diffusely decreased in attenuation consistent with fatty infiltration. Mild heterogeneity is noted without definitive mass. The gallbladder is decompressed. Pancreas: Unremarkable. No pancreatic ductal dilatation or surrounding inflammatory changes. Spleen: Normal in size without focal abnormality. Adrenals/Urinary Tract: The adrenal glands are within normal limits. The kidneys demonstrate a normal enhancement pattern. Tiny nonobstructing left renal stone is noted. Normal excretion of contrast material is noted bilaterally. Bladder is partially distended. Stomach/Bowel: The appendix is within normal limits. The colon is predominately decompressed. There is an enhancing lesion identified in the ascending colon just above the cecum which measures approximately 6 x 4.4 cm in greatest AP and transverse dimensions respectively. It extends for approximately 5.8 cm in craniocaudad projection. More distal colon appears within normal limits. No small bowel obstructive changes are seen. The stomach is within normal limits. Mild esophageal  varices are seen. Vascular/Lymphatic: Aortic atherosclerosis. No enlarged abdominal or pelvic lymph nodes. Reproductive: Prostate is unremarkable. Other: Significant ascites is noted within the abdomen. Mild increased density is noted within the omentum which may be related to the underlying ascites although the possibility of peritoneal disease could not be totally excluded given colonic mass. Musculoskeletal: No acute or significant osseous findings. IMPRESSION: Large enhancing mass lesion within the ascending colon consistent with colonic neoplasm till proven otherwise. Significant ascites throughout the abdomen with some increased density within the omentum suggestive of peritoneal metastatic disease. This may simply be related to the underlying ascites. Heterogeneity of the liver without definitive mass. Some mild esophageal varices are seen and these changes are likely related to underlying cirrhosis. Tiny nonobstructing left renal stone. Electronically Signed   By: Inez Catalina M.D.   On: 01/18/2019 02:22   US Paracentesis  Result Date: 01/18/2019 INDICATION: Patient with history of colon mass, probable cirrhosis by imaging, alcohol abuse, ascites. Request made for diagnostic and therapeutic paracentesis. EXAM: ULTRASOUND GUIDED DIAGNOSTIC AND THERAPEUTIC PARACENTESIS MEDICATIONS: None COMPLICATIONS: None immediate. PROCEDURE: Informed written consent was obtained from the patient after a discussion of the risks, benefits and alternatives to treatment. A timeout was performed prior to the initiation of the procedure. Initial ultrasound scanning demonstrates a large amount of ascites within the right lower abdominal quadrant. The right lower abdomen was prepped and draped in the usual sterile fashion. 1% lidocaine was used for local anesthesia. Following this, a 19 gauge, 7-cm, Yueh catheter was introduced. An ultrasound image was saved for documentation purposes. The paracentesis was performed. The  catheter was removed and a dressing was applied. The patient tolerated the procedure well without immediate post procedural complication. FINDINGS: A total of approximately 7.4 liters of yellow fluid was removed. Samples were sent to the laboratory as requested by the clinical team. IMPRESSION: Successful ultrasound-guided diagnostic and therapeutic paracentesis yielding 7.4 liters of peritoneal fluid. Read by: Rowe Robert, PA-C Electronically Signed   By: Lucrezia Europe M.D.   On: 01/18/2019 15:31   Dg Chest Portable 1 View  Result Date: 01/18/2019 CLINICAL DATA:  Palpitations, shortness of breath EXAM: PORTABLE CHEST 1 VIEW COMPARISON:  08/28/2010 FINDINGS: Heart and mediastinal contours are within normal limits. No focal opacities or effusions. No acute bony abnormality. IMPRESSION: No active disease. Electronically Signed   By: Rolm Baptise M.D.   On: 01/18/2019 01:56   Review of Systems  Constitutional: Positive for malaise/fatigue and weight loss. Negative for chills, diaphoresis and fever.  HENT: Negative.   Eyes: Negative.   Respiratory: Positive for shortness of breath.   Cardiovascular: Negative.   Gastrointestinal: Positive for abdominal pain, diarrhea and heartburn. Negative for blood in stool, constipation, melena, nausea and vomiting.  Genitourinary: Negative.   Musculoskeletal: Negative.   Skin: Negative.   Neurological: Positive for weakness.  Endo/Heme/Allergies: Negative.   Psychiatric/Behavioral: Positive for depression and substance abuse. Negative for hallucinations, memory loss and suicidal ideas. The patient is nervous/anxious. The patient does not have insomnia.    Blood pressure 139/84, pulse 60, temperature 98.6 F (37 C), temperature source Oral, resp. rate 12, SpO2 100 %. Physical Exam  Constitutional: He appears cachectic. He is cooperative. He appears toxic. No distress.  HENT:  Head: Normocephalic and atraumatic.  Eyes: Conjunctivae, EOM and lids are normal.   Neck: Trachea normal and normal range of motion. No thyroid mass present.  Cardiovascular: Normal rate and regular rhythm.  Respiratory: Effort normal and breath sounds normal. No  accessory muscle usage. No respiratory distress.  GI: Soft. Normal appearance and bowel sounds are normal. There is no hepatosplenomegaly. There is abdominal tenderness in the epigastric area and periumbilical area. There is no rigidity, no guarding and no CVA tenderness.  Neurological: He is alert.   Assessment/Plan: 1) Large ascending colon mass on CT scan of the abdomen pelvis done today along with peritoneal carcinomatosis.  Plans are to prep him for colonoscopy tonight.  Saralyn Pilar Dr. Carol Ada will do the colonoscopy tomorrow. 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  Patient has 7-1/2 L of ascitic fluid removed today and is receiving albumin tonight.  He will need an EGD as well.  CIWA protocol is in place for withdrawal symptoms as his last drink was 5 days ago. 3) Abnormal weight loss-it would be helpful to check alpha-fetoprotein level. 4) Acid reflux on intermittent PPIs at home. 5) Hypertension on Bisoprolol protocol at home. 6) Severe malnutrition secondary to alcoholism and poor nutritional intake. 7) History of atrial fibrillation. 8) History of depression on Sertraline at home [facility] 01/18/2019, 5:28 PM

## 2019-01-18 NOTE — ED Notes (Signed)
Will not transfuse the other unit due to the previous nurse speaking with hospitalist and he stating he wanted two units of blood.

## 2019-01-18 NOTE — H&P (View-Only) (Signed)
UNASSIGNED PATIENT  Reason for Consult: Colonic mass on CT scan. Referring Physician: [facility] hospitalist  Martin Anthony is an 53 y.o. male.  HPI: Martin Anthony is a 53 year old white male with multiple medical problems listed below, who presented to the [facility] long emergency room with a 5-week history of worsening abdominal pain increasing abdominal girth and a 40 pound weight loss over the last 18 months.  Patient claims he lost his job at [employer] in April 2019 has not worked since then.  This is caused him to be seriously depressed.  He has a history of atrial fibrillation that has not been treated along with a history of depression hypertension reflux adult onset diabetes mellitus and longstanding history of alcoholism he gives a history of worsening of his symptoms about 5 months ago he start developing nausea and vomiting and was unable to keep anything down.  Shortly thereafter he developed diarrhea with 3-4 loose bowel movements per day.  He denies having any melena or hematochezia.  He has a longstanding history of reflux and takes over-the-counter omeprazole as needed for his symptoms.  Patient denies having a history of odynophagia or dysphagia.  He denies the use of OTC nonsteroidals. He is never had a colonoscopy even though he was advised by his PCP to have one about a year ago.  He has been having some periumbilical pain and epigastric pain that is worsened over the last few weeks prompting him to come to the emergency room he claims he has been under a lot of stress because of COVID as his helping his 29-year-old daughter with online classes as she is home from school.  He admits to drinking 5-7 shots of Crown Royal whiskey per night and sometimes more.  He denies use of illicit drugs but has used marijuana in the remote past.  There is no known family history of colitis or colon cancer.  On presentation to the emergency room he was found to have a large enhancing mass in the ascending colon  with omental caking suggestive of peritoneal carcinomatosis and massive ascites.  Mild esophageal varices also noted with possibility of underlying cirrhosis.  Hemoglobin was noted to be at 5.6 g/dL with albumin of 3.  GI consultation was procured for possible endoscopy tomorrow.  Past Medical History:  Diagnosis Date  . Anxiety   . Depression   . Diabetes mellitus without complication ([place])   . GERD (gastroesophageal reflux disease)   . Gout   . Hyperlipidemia   . Hypertension   . Substance abuse ([place])    alcoholism     Past Surgical History:  Procedure Laterality Date  . HERNIA REPAIR     umbilical   . INGUINAL HERNIA REPAIR Bilateral     Family History  Problem Relation Age of Onset  . Hypertension Mother   . Diabetes Father   . Heart attack Father   . Pneumonia Father   . Hypertension Father   . Diabetes Maternal Grandfather    Social History:  reports that he quit smoking about 17 years ago. He has never used smokeless tobacco. He reports current alcohol use of about 2.0 standard drinks of alcohol per week. He reports that he does not use drugs.  Allergies: No Known Allergies  Medications: I have reviewed the patient's current medications.  Results for orders placed or performed during the hospital encounter of 01/18/19 (from the past 48 hour(s))  Lipase, blood     Status: None   Collection  Time: 01/18/19 12:30 AM  Result Value Ref Range   Lipase 23 11 - 51 U/L    Comment: Performed at [facility], [place] [address], [place], [place] [address]  Comprehensive metabolic panel     Status: Abnormal   Collection Time: 01/18/19 12:30 AM  Result Value Ref Range   Sodium 135 135 - 145 mmol/L   Potassium 3.6 3.5 - 5.1 mmol/L   Chloride 105 98 - 111 mmol/L   CO2 20 (L) 22 - 32 mmol/L   Glucose, Bld 126 (H) 70 - 99 mg/dL   BUN 10 6 - 20 mg/dL   Creatinine, Ser 0.98 0.61 - 1.24 mg/dL   Calcium 8.5 (L) 8.9 - 10.3 mg/dL   Total Protein 7.8 6.5 - 8.1 g/dL    Albumin 3.0 (L) 3.5 - 5.0 g/dL   AST 41 15 - 41 U/L   ALT 14 0 - 44 U/L   Alkaline Phosphatase 118 38 - 126 U/L   Total Bilirubin 1.9 (H) 0.3 - 1.2 mg/dL   GFR calc non Af Amer >60 >60 mL/min   GFR calc Af Amer >60 >60 mL/min   Anion gap 10 5 - 15    Comment: Performed at [facility], [place] [address], [place], [place] [address]  CBC     Status: Abnormal   Collection Time: 01/18/19 12:30 AM  Result Value Ref Range   WBC 3.9 (L) 4.0 - 10.5 K/uL   RBC 3.13 (L) 4.22 - 5.81 MIL/uL   Hemoglobin 6.5 (LL) 13.0 - 17.0 g/dL    Comment: REPEATED TO VERIFY THIS CRITICAL RESULT HAS VERIFIED AND BEEN CALLED TO RN C RAVILLE BY ALEXIS Champlin ON 08 05 2020 AT [phone], AND HAS BEEN READ BACK.     HCT 24.4 (L) 39.0 - 52.0 %   MCV 78.0 (L) 80.0 - 100.0 fL   MCH 20.8 (L) 26.0 - 34.0 pg   MCHC 26.6 (L) 30.0 - 36.0 g/dL   RDW 21.4 (H) 11.5 - 15.5 %   Platelets 217 150 - 400 K/uL   nRBC 0.0 0.0 - 0.2 %    Comment: Performed at [facility], [place] [address] [place], [place] [address]  Ethanol     Status: None   Collection Time: 01/18/19  1:03 AM  Result Value Ref Range   Alcohol, Ethyl (B) <10 <10 mg/dL    Comment: (NOTE) Lowest detectable limit for serum alcohol is 10 mg/dL. For medical purposes only. Performed at [facility], [place] [address] [place], [place] [address]   Troponin I (High Sensitivity)     Status: None   Collection Time: 01/18/19  1:03 AM  Result Value Ref Range   Troponin I (High Sensitivity) 5 <18 ng/L    Comment: (NOTE) Elevated high sensitivity troponin I (hsTnI) values and significant  changes across serial measurements may suggest ACS but many other  chronic and acute conditions are known to elevate hsTnI results.  Refer to the "Links" section for chest pain algorithms and additional  guidance. Performed at [facility], [place] [address] [place],  [address]   Protime-INR     Status:  Abnormal   Collection Time: 01/18/19  1:03 AM  Result Value Ref Range   Prothrombin Time 16.0 (H) 11.4 - 15.2 seconds   INR 1.3 (H) 0.8 - 1.2    Comment: (NOTE) INR goal varies based on device and disease states. Performed at [facility], [place] [address], [place], [place] [address]   Magnesium     Status: None   Collection Time: 01/18/19  1:03 AM  Result Value Ref Range   Magnesium 2.1 1.7 - 2.4 mg/dL    Comment: Performed at [facility] [place] [address] [place], [place] [address]  SARS CORONAVIRUS 2 Nasal Swab Aptima Multi Swab     Status: None   Collection Time: 01/18/19  1:21 AM   Specimen: Aptima Multi Swab; Nasal Swab  Result Value Ref Range   SARS Coronavirus 2 NEGATIVE NEGATIVE    Comment: (NOTE) SARS-CoV-2 target nucleic acids are NOT DETECTED. The SARS-CoV-2 RNA is generally detectable in upper and lower respiratory specimens during the acute phase of infection. Negative results do not preclude SARS-CoV-2 infection, do not rule out co-infections with other pathogens, and should not be used as the sole basis for treatment or other patient management decisions. Negative results must be combined with clinical observations, patient history, and epidemiological information. The expected result is Negative. Fact Sheet for Patients: SugarRoll.be Fact Sheet for Healthcare Providers: https://www.woods-mathews.com/ This test is not yet approved or cleared by the [place] FDA and  has been authorized for detection and/or diagnosis of SARS-CoV-2 by FDA under an Emergency Use Authorization (EUA). This EUA will remain  in effect (meaning this test can be used) for the duration of the COVID-19 declaration under Section 56 4(b)(1) of the Act, 21 U.S.C. section 360bbb-3(b)(1), unless the authorization is terminated or revoked sooner. Performed at [facility], [place] [address] [place],  [place] [address]   Type and screen     Status: None (Preliminary result)   Collection Time: 01/18/19  1:34 AM  Result Value Ref Range   ABO/RH(D) O POS    Antibody Screen NEG    Sample Expiration 01/21/2019,2359    Unit Number [id number]    Blood Component Type RED CELLS,LR    Unit division 00    Status of Unit ISSUED    Transfusion Status OK TO TRANSFUSE    Crossmatch Result Compatible    Unit Number [id number]    Blood Component Type RED CELLS,LR    Unit division 00    Status of Unit ISSUED    Transfusion Status OK TO TRANSFUSE    Crossmatch Result      Compatible Performed at [facility], [place] [address], [place], [place] [address]    Unit Number [id number]    Blood Component Type RED CELLS,LR    Unit division 00    Status of Unit ALLOCATED    Transfusion Status OK TO TRANSFUSE    Crossmatch Result Compatible   Prepare RBC     Status: None   Collection Time: 01/18/19  1:34 AM  Result Value Ref Range   Order Confirmation      ORDER PROCESSED BY BLOOD BANK Performed at [facility], [place] [address] [place], [place] [address]   ABO/Rh     Status: None   Collection Time: 01/18/19  1:34 AM  Result Value Ref Range   ABO/RH(D)      O POS Performed at [facility], [place] [address] [place], [place] [address]   POC occult blood, ED     Status: None   Collection Time: 01/18/19  1:37 AM  Result Value Ref Range   Fecal Occult Bld NEGATIVE NEGATIVE  Urinalysis, Routine w reflex microscopic     Status: Abnormal   Collection Time:  01/18/19  4:35 AM  Result Value Ref Range   Color, Urine YELLOW YELLOW   APPearance CLEAR CLEAR   Specific Gravity, Urine >1.046 (H) 1.005 - 1.030   pH 5.0 5.0 - 8.0   Glucose, UA NEGATIVE NEGATIVE mg/dL   Hgb urine dipstick NEGATIVE NEGATIVE   Bilirubin Urine NEGATIVE NEGATIVE   Ketones, ur 5 (A) NEGATIVE mg/dL   Protein, ur NEGATIVE NEGATIVE mg/dL   Nitrite NEGATIVE NEGATIVE    Leukocytes,Ua NEGATIVE NEGATIVE    Comment: Performed at [place] [address], [place], [place] [address]  CBC WITH DIFFERENTIAL     Status: Abnormal   Collection Time: 01/18/19  5:25 AM  Result Value Ref Range   WBC 3.4 (L) 4.0 - 10.5 K/uL   RBC 2.69 (L) 4.22 - 5.81 MIL/uL   Hemoglobin 5.6 (LL) 13.0 - 17.0 g/dL   HCT 21.0 (L) 39.0 - 52.0 %   MCV 78.1 (L) 80.0 - 100.0 fL   MCH 20.8 (L) 26.0 - 34.0 pg   MCHC 26.7 (L) 30.0 - 36.0 g/dL   RDW 21.1 (H) 11.5 - 15.5 %   Platelets 162 150 - 400 K/uL   nRBC 0.0 0.0 - 0.2 %   Neutrophils Relative % 57 %   Neutro Abs 2.0 1.7 - 7.7 K/uL   Lymphocytes Relative 30 %   Lymphs Abs 1.0 0.7 - 4.0 K/uL   Monocytes Relative 9 %   Monocytes Absolute 0.3 0.1 - 1.0 K/uL   Eosinophils Relative 3 %   Eosinophils Absolute 0.1 0.0 - 0.5 K/uL   Basophils Relative 1 %   Basophils Absolute 0.0 0.0 - 0.1 K/uL   Immature Granulocytes 0 %   Abs Immature Granulocytes 0.01 0.00 - 0.07 K/uL   Polychromasia PRESENT    Ovalocytes PRESENT     Comment: Performed at [facility] [place] [address], [place], [place] [address]  Prepare RBC     Status: None   Collection Time: 01/18/19  8:30 AM  Result Value Ref Range   Order Confirmation      ORDER PROCESSED BY BLOOD BANK Performed at [place] [address] [place], [place] [address]   Vitamin B12     Status: None   Collection Time: 01/18/19 11:00 AM  Result Value Ref Range   Vitamin B-12 459 180 - 914 pg/mL    Comment: (NOTE) This assay is not validated for testing neonatal or myeloproliferative syndrome specimens for Vitamin B12 levels. Performed at [facility], [place] [address], [place], [place] [address]   Folate     Status: None   Collection Time: 01/18/19 11:00 AM  Result Value Ref Range   Folate 19.3 >5.9 ng/mL    Comment: Performed at [facility], [place] [address], [place], [place] [address]  Iron  and TIBC     Status: Abnormal   Collection Time: 01/18/19 11:00 AM  Result Value Ref Range   Iron 20 (L) 45 - 182 ug/dL   TIBC 311 250 - 450 ug/dL   Saturation Ratios 6 (L) 17.9 - 39.5 %   UIBC 291 ug/dL    Comment: Performed at [facility], [place] [address] [place], [place] [address]  Ferritin     Status: Abnormal   Collection Time: 01/18/19 11:00 AM  Result Value Ref Range   Ferritin 11 (L) 24 - 336 ng/mL    Comment: Performed at [facility], [address] [place], [place] [address]  Reticulocytes     Status: Abnormal   Collection Time: 01/18/19 11:00 AM  Result Value Ref Range   Retic Ct Pct 1.6 0.4 - 3.1 %   RBC. 2.98 (L) 4.22 - 5.81 MIL/uL   Retic Count, Absolute 46.5 19.0 - 186.0 K/uL   Immature Retic Fract 34.7 (H) 2.3 - 15.9 %    Comment: Performed at [facility], [place] [address], [place], [place] [address]  Hemoglobin A1c     Status: None   Collection Time: 01/18/19 11:00 AM  Result Value Ref Range   Hgb A1c MFr Bld 5.3 4.8 - 5.6 %    Comment: (NOTE) Pre diabetes:          5.7%-6.4% Diabetes:              >6.4% Glycemic control for   <7.0% adults with diabetes    Mean Plasma Glucose 105.41 mg/dL    Comment: Performed at [place] [address], [place], [place] [address]  Albumin, pleural or peritoneal fluid     Status: None   Collection Time: 01/18/19  3:45 PM  Result Value Ref Range   Albumin, Fluid 1.1 g/dL    Comment: (NOTE) No normal range established for this test Results should be evaluated in conjunction with serum values    Fluid Type-FALB Peritoneal     Comment: Performed at [facility], [place] [address], [place], [place] [address]  Protein, pleural or peritoneal fluid     Status: None   Collection Time: 01/18/19  3:45 PM  Result Value Ref Range   Total protein, fluid <3.0 g/dL   Fluid Type-FTP Peritoneal     Comment: Performed at [facility],  [place] [address], [place], [place] [address]  Glucose, pleural or peritoneal fluid     Status: None   Collection Time: 01/18/19  3:45 PM  Result Value Ref Range   Glucose, Fluid 106 mg/dL    Comment: (NOTE) No normal range established for this test Results should be evaluated in conjunction with serum values    Fluid Type-FGLU Peritoneal     Comment: Performed at [facility], [place] [address] [place],  [address]    Ct Abdomen Pelvis W Contrast  Result Date: 01/18/2019 CLINICAL DATA:  Abdominal pain and distension EXAM: CT ABDOMEN AND PELVIS WITH CONTRAST TECHNIQUE: Multidetector CT imaging of the abdomen and pelvis was performed using the standard protocol following bolus administration of intravenous contrast. CONTRAST:  110mL OMNIPAQUE IOHEXOL 300 MG/ML  SOLN COMPARISON:  None. FINDINGS: Lower chest: No acute abnormality. Hepatobiliary: Liver is diffusely decreased in attenuation consistent with fatty infiltration. Mild heterogeneity is noted without definitive mass. The gallbladder is decompressed. Pancreas: Unremarkable. No pancreatic ductal dilatation or surrounding inflammatory changes. Spleen: Normal in size without focal abnormality. Adrenals/Urinary Tract: The adrenal glands are within normal limits. The kidneys demonstrate a normal enhancement pattern. Tiny nonobstructing left renal stone is noted. Normal excretion of contrast material is noted bilaterally. Bladder is partially distended. Stomach/Bowel: The appendix is within normal limits. The colon is predominately decompressed. There is an enhancing lesion identified in the ascending colon just above the cecum which measures approximately 6 x 4.4 cm in greatest AP and transverse dimensions respectively. It extends for approximately 5.8 cm in craniocaudad projection. More distal colon appears within normal limits. No small bowel obstructive changes are seen. The stomach is within normal limits. Mild esophageal  varices are seen. Vascular/Lymphatic: Aortic atherosclerosis. No enlarged abdominal or pelvic lymph nodes. Reproductive: Prostate is unremarkable. Other: Significant ascites is noted within the abdomen. Mild increased density is noted within the omentum which may be related to the underlying ascites although the possibility of peritoneal disease could not be totally excluded given colonic mass. Musculoskeletal: No acute or significant osseous findings. IMPRESSION: Large enhancing mass lesion within the ascending colon consistent with colonic neoplasm till proven otherwise. Significant ascites throughout the abdomen with some increased density within the omentum suggestive of peritoneal metastatic disease. This may simply be related to the underlying ascites. Heterogeneity of the liver without definitive mass. Some mild esophageal varices are seen and these changes are likely related to underlying cirrhosis. Tiny nonobstructing left renal stone. Electronically Signed   By: Inez Catalina M.D.   On: 01/18/2019 02:22   US Paracentesis  Result Date: 01/18/2019 INDICATION: Patient with history of colon mass, probable cirrhosis by imaging, alcohol abuse, ascites. Request made for diagnostic and therapeutic paracentesis. EXAM: ULTRASOUND GUIDED DIAGNOSTIC AND THERAPEUTIC PARACENTESIS MEDICATIONS: None COMPLICATIONS: None immediate. PROCEDURE: Informed written consent was obtained from the patient after a discussion of the risks, benefits and alternatives to treatment. A timeout was performed prior to the initiation of the procedure. Initial ultrasound scanning demonstrates a large amount of ascites within the right lower abdominal quadrant. The right lower abdomen was prepped and draped in the usual sterile fashion. 1% lidocaine was used for local anesthesia. Following this, a 19 gauge, 7-cm, Yueh catheter was introduced. An ultrasound image was saved for documentation purposes. The paracentesis was performed. The  catheter was removed and a dressing was applied. The patient tolerated the procedure well without immediate post procedural complication. FINDINGS: A total of approximately 7.4 liters of yellow fluid was removed. Samples were sent to the laboratory as requested by the clinical team. IMPRESSION: Successful ultrasound-guided diagnostic and therapeutic paracentesis yielding 7.4 liters of peritoneal fluid. Read by: Rowe Robert, PA-C Electronically Signed   By: Lucrezia Europe M.D.   On: 01/18/2019 15:31   Dg Chest Portable 1 View  Result Date: 01/18/2019 CLINICAL DATA:  Palpitations, shortness of breath EXAM: PORTABLE CHEST 1 VIEW COMPARISON:  08/28/2010 FINDINGS: Heart and mediastinal contours are within normal limits. No focal opacities or effusions. No acute bony abnormality. IMPRESSION: No active disease. Electronically Signed   By: Rolm Baptise M.D.   On: 01/18/2019 01:56   Review of Systems  Constitutional: Positive for malaise/fatigue and weight loss. Negative for chills, diaphoresis and fever.  HENT: Negative.   Eyes: Negative.   Respiratory: Positive for shortness of breath.   Cardiovascular: Negative.   Gastrointestinal: Positive for abdominal pain, diarrhea and heartburn. Negative for blood in stool, constipation, melena, nausea and vomiting.  Genitourinary: Negative.   Musculoskeletal: Negative.   Skin: Negative.   Neurological: Positive for weakness.  Endo/Heme/Allergies: Negative.   Psychiatric/Behavioral: Positive for depression and substance abuse. Negative for hallucinations, memory loss and suicidal ideas. The patient is nervous/anxious. The patient does not have insomnia.    Blood pressure 139/84, pulse 60, temperature 98.6 F (37 C), temperature source Oral, resp. rate 12, SpO2 100 %. Physical Exam  Constitutional: He appears cachectic. He is cooperative. He appears toxic. No distress.  HENT:  Head: Normocephalic and atraumatic.  Eyes: Conjunctivae, EOM and lids are normal.   Neck: Trachea normal and normal range of motion. No thyroid mass present.  Cardiovascular: Normal rate and regular rhythm.  Respiratory: Effort normal and breath sounds normal. No  accessory muscle usage. No respiratory distress.  GI: Soft. Normal appearance and bowel sounds are normal. There is no hepatosplenomegaly. There is abdominal tenderness in the epigastric area and periumbilical area. There is no rigidity, no guarding and no CVA tenderness.  Neurological: He is alert.   Assessment/Plan: 1) Large ascending colon mass on CT scan of the abdomen pelvis done today along with peritoneal carcinomatosis.  Plans are to prep him for colonoscopy tonight.  Saralyn Pilar Dr. Carol Ada will do the colonoscopy tomorrow. 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  Patient has 7-1/2 L of ascitic fluid removed today and is receiving albumin tonight.  He will need an EGD as well.  CIWA protocol is in place for withdrawal symptoms as his last drink was 5 days ago. 3) Abnormal weight loss-it would be helpful to check alpha-fetoprotein level. 4) Acid reflux on intermittent PPIs at home. 5) Hypertension on Bisoprolol protocol at home. 6) Severe malnutrition secondary to alcoholism and poor nutritional intake. 7) History of atrial fibrillation. 8) History of depression on Sertraline at home [facility] 01/18/2019, 5:28 PM

## 2019-01-18 NOTE — ED Notes (Signed)
Patient going to ultrasound 

## 2019-01-19 ENCOUNTER — Inpatient Hospital Stay (HOSPITAL_COMMUNITY): Payer: Self-pay | Admitting: Anesthesiology

## 2019-01-19 ENCOUNTER — Encounter (HOSPITAL_COMMUNITY): Payer: Self-pay | Admitting: *Deleted

## 2019-01-19 ENCOUNTER — Encounter (HOSPITAL_COMMUNITY)
Admission: EM | Disposition: A | Discharge status: Home / Self Care | Payer: Self-pay | Source: Home / Self Care | Attending: Internal Medicine

## 2019-01-19 HISTORY — PX: COLONOSCOPY: SHX5424

## 2019-01-19 HISTORY — PX: POLYPECTOMY: SHX5525

## 2019-01-19 HISTORY — PX: ESOPHAGOGASTRODUODENOSCOPY (EGD) WITH PROPOFOL: SHX5813

## 2019-01-19 HISTORY — PX: HEMOSTASIS CLIP PLACEMENT: SHX6857

## 2019-01-19 HISTORY — PX: BIOPSY: SHX5522

## 2019-01-19 LAB — CBC
HCT: 25.6 % — ABNORMAL LOW (ref 39.0–52.0)
Hemoglobin: 7.1 g/dL — ABNORMAL LOW (ref 13.0–17.0)
MCH: 22.8 pg — ABNORMAL LOW (ref 26.0–34.0)
MCHC: 27.7 g/dL — ABNORMAL LOW (ref 30.0–36.0)
MCV: 82.1 fL (ref 80.0–100.0)
Platelets: 157 10*3/uL (ref 150–400)
RBC: 3.12 MIL/uL — ABNORMAL LOW (ref 4.22–5.81)
RDW: 20.6 % — ABNORMAL HIGH (ref 11.5–15.5)
WBC: 3.2 10*3/uL — ABNORMAL LOW (ref 4.0–10.5)
nRBC: 0 % (ref 0.0–0.2)

## 2019-01-19 LAB — BASIC METABOLIC PANEL
Anion gap: 6 (ref 5–15)
BUN: 7 mg/dL (ref 6–20)
CO2: 21 mmol/L — ABNORMAL LOW (ref 22–32)
Calcium: 7.7 mg/dL — ABNORMAL LOW (ref 8.9–10.3)
Chloride: 107 mmol/L (ref 98–111)
Creatinine, Ser: 0.87 mg/dL (ref 0.61–1.24)
GFR calc Af Amer: >60 mL/min (ref >60)
GFR calc non Af Amer: >60 mL/min (ref >60)
Glucose, Bld: 98 mg/dL (ref 70–99)
Potassium: 3.4 mmol/L — ABNORMAL LOW (ref 3.5–5.1)
Sodium: 134 mmol/L — ABNORMAL LOW (ref 135–145)

## 2019-01-19 LAB — HEMOGLOBIN AND HEMATOCRIT, BLOOD
HCT: 32.3 % — ABNORMAL LOW (ref 39.0–52.0)
Hemoglobin: 9 g/dL — ABNORMAL LOW (ref 13.0–17.0)

## 2019-01-19 LAB — HEPATITIS PANEL, ACUTE
HCV Ab: <0.1 s/co ratio (ref 0.0–0.9)
Hep A IgM: NEGATIVE
Hep B C IgM: NEGATIVE
Hepatitis B Surface Ag: NEGATIVE

## 2019-01-19 LAB — PREPARE RBC (CROSSMATCH)

## 2019-01-19 LAB — HIV ANTIBODY (ROUTINE TESTING W REFLEX): HIV Screen 4th Generation wRfx: NONREACTIVE

## 2019-01-19 SURGERY — ESOPHAGOGASTRODUODENOSCOPY (EGD) WITH PROPOFOL
Anesthesia: Monitor Anesthesia Care

## 2019-01-19 MED ORDER — PROPOFOL 10 MG/ML IV BOLUS
INTRAVENOUS | Status: AC
  Filled 2019-01-19: qty 60

## 2019-01-19 MED ORDER — SODIUM CHLORIDE 0.9% IV SOLUTION
Freq: Once | INTRAVENOUS | Status: AC
  Administered 2019-01-19: 16:00:00 via INTRAVENOUS

## 2019-01-19 MED ORDER — EPHEDRINE SULFATE 50 MG/ML IJ SOLN
INTRAMUSCULAR | Status: DC | PRN
  Administered 2019-01-19 (×4): 10 mg via INTRAVENOUS

## 2019-01-19 MED ORDER — PROPOFOL 10 MG/ML IV BOLUS
INTRAVENOUS | Status: AC
  Filled 2019-01-19: qty 20

## 2019-01-19 MED ORDER — LACTATED RINGERS IV SOLN
INTRAVENOUS | Status: DC
  Administered 2019-01-19: 13:00:00 via INTRAVENOUS

## 2019-01-19 MED ORDER — PHENYLEPHRINE HCL (PRESSORS) 10 MG/ML IV SOLN
INTRAVENOUS | Status: DC | PRN
  Administered 2019-01-19: 80 ug via INTRAVENOUS

## 2019-01-19 MED ORDER — PROPOFOL 500 MG/50ML IV EMUL
INTRAVENOUS | Status: DC | PRN
  Administered 2019-01-19: 150 ug/kg/min via INTRAVENOUS

## 2019-01-19 SURGICAL SUPPLY — 15 items

## 2019-01-19 NOTE — Transfer of Care (Signed)
Immediate Anesthesia Transfer of Care Note  Patient: DEONDREA MARKOS  Procedure(s) Performed: ESOPHAGOGASTRODUODENOSCOPY (EGD) WITH PROPOFOL (N/A ) COLONOSCOPY (N/A ) POLYPECTOMY HEMOSTASIS CLIP PLACEMENT  Patient Location: PACU  Anesthesia Type:MAC  Level of Consciousness: sedated, patient cooperative and responds to stimulation  Airway & Oxygen Therapy: Patient Spontanous Breathing and Patient connected to nasal cannula oxygen  Post-op Assessment: Report given to RN and Post -op Vital signs reviewed and stable  Post vital signs: Reviewed and stable  Last Vitals:  Vitals Value Taken Time  BP 69/31 01/19/19 1403  Temp    Pulse 66 01/19/19 1404  Resp 16 01/19/19 1405  SpO2 99 % 01/19/19 1404  Vitals shown include unvalidated device data.  Last Pain:  Vitals:   01/19/19 1227  TempSrc: Oral  PainSc: 0-No pain         Complications: No apparent anesthesia complications

## 2019-01-19 NOTE — Progress Notes (Addendum)
Initial Nutrition Assessment  INTERVENTION:   -Diet advancement per MD -Once diet advanced, recommend Ensure Enlive po BID, each supplement provides 350 kcal and 20 grams of protein  NUTRITION DIAGNOSIS:   Inadequate oral intake related to nausea, vomiting(abdominal pain) as evidenced by per patient/family report.  GOAL:   Patient will meet greater than or equal to 90% of their needs  MONITOR:   Diet advancement, Labs, Weight trends, I & O's  REASON FOR ASSESSMENT:   Consult Assessment of nutrition requirement/status  ASSESSMENT:   53 year old male with history of A. fib not on medication, HTN, depression, alcohol use disorder and GERD presenting with worsening abdominal pain distention for 2 weeks, and 40 pound weight loss in 1 year.  8/2: s/p paracentesis, yield: 7.4L  **RD working remotely**  Per chart review, pt expected to have colonoscopy and EGD. Patient reports poor PO since 7/31. Pt has been having abdominal pain and some episodes of N/V. Pt with history of ETOH abuse (5-7 shots of whiskey daily). Pt was on clear liquids last night and consumed some jello and broth. Once diet is advanced, recommend nutritional supplements.  Per patient report, pt has lost 40 lbs over 18 months. **Addendum: Weight was measured today as 83.9 kg (184 lbs). Patient has lost 20 lbs since March 2019 (9% wt loss x 1.5 years, insignificant for time frame).  Medications: folic acid tablet daily, Lasix tablet daily, Multivitamin with minerals daily, Thiamine tablet daily Labs reviewed: Low Na, K   NUTRITION - FOCUSED PHYSICAL EXAM:  Unable to perform -working remotely.  Diet Order:   Diet Order            Diet NPO time specified  Diet effective now              EDUCATION NEEDS:   Not appropriate for education at this time  Skin:  Skin Assessment: Reviewed RN Assessment  Last BM:  8/4  Height:   Ht Readings from Last 1 Encounters:  01/19/19 6\' 3"  (1.905 m)     Weight:   Wt Readings from Last 1 Encounters:  01/19/19 83.9 kg    Ideal Body Weight:  89.1 kg  BMI:  Body mass index is 23.12 kg/m.  Estimated Nutritional Needs:   Kcal:  2400-2600  Protein:  115-125g  Fluid:  2L/day   Clayton Bibles, MS, RD, LDN [place] Dietitian Pager: [phone] After Hours Pager: [phone]

## 2019-01-19 NOTE — Anesthesia Postprocedure Evaluation (Signed)
Anesthesia Post Note  Patient: Martin Anthony  Procedure(s) Performed: ESOPHAGOGASTRODUODENOSCOPY (EGD) WITH PROPOFOL (N/A ) COLONOSCOPY (N/A ) POLYPECTOMY HEMOSTASIS CLIP PLACEMENT     Patient location during evaluation: PACU Anesthesia Type: MAC Level of consciousness: awake and alert Pain management: pain level controlled Vital Signs Assessment: post-procedure vital signs reviewed and stable Respiratory status: spontaneous breathing and respiratory function stable Cardiovascular status: stable Postop Assessment: no apparent nausea or vomiting Anesthetic complications: no    Last Vitals:  Vitals:   01/19/19 1420 01/19/19 1448  BP: (!) 103/53 119/74  Pulse: 62 61  Resp: 19 18  Temp:  37 C  SpO2: 95% 100%    Last Pain:  Vitals:   01/19/19 1448  TempSrc: Oral  PainSc:                  [name] DANIEL

## 2019-01-19 NOTE — Op Note (Signed)
[facility] Patient Name: Martin Anthony Procedure Date: 01/19/2019 MRN: [id number] Attending MD: Carol Ada , MD Date of Birth: [birth year] CSN: [id number] Age: 54 Admit Type: Inpatient Procedure:                Colonoscopy Indications:              Generalized abdominal pain, Iron deficiency anemia Providers:                Carol Ada, MD, Vista Lawman, RN, Elspeth Cho                            Tech., Technician, Herbie Drape, CRNA Referring MD:              Medicines:                Propofol per Anesthesia Complications:            No immediate complications. Estimated Blood Loss:     Estimated blood loss: none. Estimated blood loss                            was minimal. Procedure:                Pre-Anesthesia Assessment:                           - Prior to the procedure, a History and Physical                            was performed, and patient medications and                            allergies were reviewed. The patient's tolerance of                            previous anesthesia was also reviewed. The risks                            and benefits of the procedure and the sedation                            options and risks were discussed with the patient.                            All questions were answered, and informed consent                            was obtained. Prior Anticoagulants: The patient has                            taken no previous anticoagulant or antiplatelet                            agents. ASA Grade Assessment: III - A patient with  severe systemic disease. After reviewing the risks                            and benefits, the patient was deemed in                            satisfactory condition to undergo the procedure.                           - Sedation was administered by an anesthesia                            professional. Deep sedation was attained.                           After  obtaining informed consent, the colonoscope                            was passed under direct vision. Throughout the                            procedure, the patient's blood pressure, pulse, and                            oxygen saturations were monitored continuously. The                            PCF-H190DL ([id number] Olympus pediatric colonscope                            was introduced through the anus and advanced to the                            the cecum, identified by appendiceal orifice and                            ileocecal valve. The colonoscopy was performed                            without difficulty. The patient tolerated the                            procedure well. The quality of the bowel                            preparation was good. The ileocecal valve,                            appendiceal orifice, and rectum were photographed. Scope In: 1:30:42 PM Scope Out: 1:57:54 PM Scope Withdrawal Time: 0 hours 20 minutes 56 seconds  Total Procedure Duration: 0 hours 27 minutes 12 seconds  Findings:      A frond-like/villous and polypoid non-obstructing large mass was found       in the ascending colon. The mass was partially circumferential       (  involving one-half of the lumen circumference). The mass measured seven       cm in length. No bleeding was present. This was biopsied with a cold       snare for histology.      A 7 mm polyp was found in the recto-sigmoid colon. The polyp was       semi-sessile. The polyp was removed with a cold snare. Resection and       retrieval were complete.      In the ascending colon a large polypoid lesion was identified. It       appeared to be a very large polyp extended for most of the ascending       colon length. Initial observation of the lesion showed that it       encompassed 50% of the lumen, but with insufflation the involvement was       around a quarter to one third of the circumfirence. The lesion was soft       when  palpated with the snare. Multple fragments were obtained using a       cold snare. Several other 3 mm polyps were resected, but intentionally       not retrieved in light of the large ascending colon lesion. Impression:               - Tumor in the ascending colon. Biopsied.                           - One 7 mm polyp at the recto-sigmoid colon,                            removed with a cold snare. Resected and retrieved. Moderate Sedation:      Not Applicable - Patient had care per Anesthesia. Recommendation:           - Patient has a contact number available for                            emergencies. The signs and symptoms of potential                            delayed complications were discussed with the                            patient. Return to normal activities tomorrow.                            Written discharge instructions were provided to the                            patient.                           - Resume previous diet.                           - Continue present medications.                           - Await pathology results. Procedure Code(s):        ---  Professional ---                           [phone], Colonoscopy, flexible; with removal of                            tumor(s), polyp(s), or other lesion(s) by snare                            technique Diagnosis Code(s):        --- Professional ---                           D49.0, Neoplasm of unspecified behavior of                            digestive system                           K63.5, Polyp of colon                           R10.84, Generalized abdominal pain                           D50.9, Iron deficiency anemia, unspecified CPT copyright 2019 American Medical Association. All rights reserved. The codes documented in this report are preliminary and upon coder review may  be revised to meet current compliance requirements. Carol Ada, MD Carol Ada, MD 01/19/2019 2:15:32 PM This report has been  signed electronically. Number of Addenda: 0

## 2019-01-19 NOTE — Anesthesia Preprocedure Evaluation (Addendum)
Anesthesia Evaluation  Patient identified by MRN, date of birth, ID band Patient awake    Reviewed: Allergy & Precautions, NPO status , Patient's Chart, lab work & pertinent test results  History of Anesthesia Complications Negative for: history of anesthetic complications  Airway Mallampati: II  TM Distance: >3 FB Neck ROM: Full    Dental no notable dental hx. 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  Reproductive/Obstetrics                          Anesthesia Physical Anesthesia Plan  ASA: III  Anesthesia Plan: MAC   Post-op Pain Management:    Induction:   PONV Risk Score and Plan: 2 and Ondansetron and Propofol infusion  Airway Management Planned: Natural Airway  Additional Equipment:   Intra-op Plan:   Post-operative Plan:   Informed Consent: I have reviewed the patients History and Physical, chart, labs and discussed the procedure including the risks, benefits and alternatives for the proposed anesthesia with the patient or authorized representative who has indicated his/her understanding and acceptance.     Dental advisory given  Plan Discussed with: Anesthesiologist and CRNA  Anesthesia Plan Comments:       Anesthesia Quick Evaluation

## 2019-01-19 NOTE — Interval H&P Note (Signed)
History and Physical Interval Note:  01/19/2019 1:15 PM  Martin Anthony  has presented today for surgery, with the diagnosis of Esophageal varices cirrhosis, mass on CT scan in the right colon.  The various methods of treatment have been discussed with the patient and family. After consideration of risks, benefits and other options for treatment, the patient has consented to  Procedure(s): ESOPHAGOGASTRODUODENOSCOPY (EGD) WITH PROPOFOL (N/A) COLONOSCOPY (N/A) as a surgical intervention.  The patient's history has been reviewed, patient examined, no change in status, stable for surgery.  I have reviewed the patient's chart and labs.  Questions were answered to the patient's satisfaction.     Alexsia Klindt D

## 2019-01-19 NOTE — Progress Notes (Signed)
PROGRESS NOTE  Martin Anthony MEB:[id number] DOB: 1965-07-05   PCP: [name], MD  Patient is from: Home  DOA: 01/18/2019 LOS: 1  Brief Narrative / Interim history: 53 year old male with history of A. fib not on medication, HTN, depression, alcohol use disorder and GERD presenting with worsening abdominal pain distention for 2 weeks, and 40 pound weight loss in 1 year.   In ED, Hgb 6.5>5.6.  Creatinine 0.98.  Total bili 1.9.  INR 1.3.  FOBT negative.  EKG normal sinus rhythm.  CT abdomen and pelvis revealed enhancing ascending colon mass concerning for colonic neoplasm with large amount of ascites and peritoneal carcinomatosis and possible cirrhosis with mild esophageal varices.  Patient had paracentesis with removal of 7.5 cc peritoneal fluid on 01/18/2019.  Cytology and chemistry consistent with portal hypertension but not suggestive for SBP.  He underwent colonoscopy and EGD by Dr. Benson Norway on 01/19/2019.  Ascending colon mass biopsied.  EGD revealed large esophageal varices, portal hypertensive gastropathy and normal duodenum  Subjective: Patient received 2 units of blood so far.  Hemoglobin improved from 5.6-7.2 this morning.  Denies chest pain, dyspnea, lightheadedness, palpitation, nausea, vomiting or abdominal pain.  Multiple bowel movement for bowel prep for colonoscopy this morning.  Objective: Vitals:   01/19/19 1227 01/19/19 1402 01/19/19 1410 01/19/19 1420  BP: 140/77 (!) 94/47 (!) 105/47 (!) 103/53  Pulse: (!) 50 66 71 62  Resp: 15 16 18 19   Temp: 98.5 F (36.9 C) 97.9 F (36.6 C)    TempSrc: Oral Oral    SpO2: 99% 99% 98% 95%  Weight: 83.9 kg     Height: 6\' 3"  (1.905 m)       Intake/Output Summary (Last 24 hours) at 01/19/2019 1445 Last data filed at 01/19/2019 1357 Gross per 24 hour  Intake 699.1 ml  Output --  Net 699.1 ml   Filed Weights   01/19/19 1227  Weight: 83.9 kg    Examination:  GENERAL: No acute distress.  Appears well.  HEENT: MMM.  Vision and  hearing grossly intact.  NECK: Supple.  No apparent JVD.  RESP:  No IWOB. Good air movement bilaterally. CVS:  RRR. Heart sounds normal.  ABD/GI/GU: Bowel sounds present. Soft. Non tender.  MSK/EXT:  Moves extremities.  Significant muscle mass wasting. SKIN: no apparent skin lesion or wound NEURO: Awake, alert and oriented appropriately.  No gross deficit.  PSYCH: Calm. Normal affect.   I have personally reviewed the following labs and images:  Radiology Studies: US Paracentesis  Result Date: 01/18/2019 INDICATION: Patient with history of colon mass, probable cirrhosis by imaging, alcohol abuse, ascites. Request made for diagnostic and therapeutic paracentesis. EXAM: ULTRASOUND GUIDED DIAGNOSTIC AND THERAPEUTIC PARACENTESIS MEDICATIONS: None COMPLICATIONS: None immediate. PROCEDURE: Informed written consent was obtained from the patient after a discussion of the risks, benefits and alternatives to treatment. A timeout was performed prior to the initiation of the procedure. Initial ultrasound scanning demonstrates a large amount of ascites within the right lower abdominal quadrant. The right lower abdomen was prepped and draped in the usual sterile fashion. 1% lidocaine was used for local anesthesia. Following this, a 19 gauge, 7-cm, Yueh catheter was introduced. An ultrasound image was saved for documentation purposes. The paracentesis was performed. The catheter was removed and a dressing was applied. The patient tolerated the procedure well without immediate post procedural complication. FINDINGS: A total of approximately 7.4 liters of yellow fluid was removed. Samples were sent to the laboratory as requested by the clinical  team. IMPRESSION: Successful ultrasound-guided diagnostic and therapeutic paracentesis yielding 7.4 liters of peritoneal fluid. Read by: Rowe Robert, PA-C Electronically Signed   By: Lucrezia Europe M.D.   On: 01/18/2019 15:31    Microbiology: Recent Results (from the past 240  hour(s))  SARS CORONAVIRUS 2 Nasal Swab Aptima Multi Swab     Status: None   Collection Time: 01/18/19  1:21 AM   Specimen: Aptima Multi Swab; Nasal Swab  Result Value Ref Range Status   SARS Coronavirus 2 NEGATIVE NEGATIVE Final    Comment: (NOTE) SARS-CoV-2 target nucleic acids are NOT DETECTED. The SARS-CoV-2 RNA is generally detectable in upper and lower respiratory specimens during the acute phase of infection. Negative results do not preclude SARS-CoV-2 infection, do not rule out co-infections with other pathogens, and should not be used as the sole basis for treatment or other patient management decisions. Negative results must be combined with clinical observations, patient history, and epidemiological information. The expected result is Negative. Fact Sheet for Patients: SugarRoll.be Fact Sheet for Healthcare Providers: https://www.woods-mathews.com/ This test is not yet approved or cleared by the [place] FDA and  has been authorized for detection and/or diagnosis of SARS-CoV-2 by FDA under an Emergency Use Authorization (EUA). This EUA will remain  in effect (meaning this test can be used) for the duration of the COVID-19 declaration under Section 56 4(b)(1) of the Act, 21 U.S.C. section 360bbb-3(b)(1), unless the authorization is terminated or revoked sooner. Performed at [facility], [place] [address], [place], [place] [address]   Body fluid culture     Status: None (Preliminary result)   Collection Time: 01/18/19  3:45 PM   Specimen: Peritoneal Fluid  Result Value Ref Range Status   Specimen Description   Final    PERITONEAL Performed at [facility] [address], [place], [place] [address]    Special Requests   Final    NONE Performed at [facility], [place] [address], [place], [place] [address]    Gram Stain   Final    FEW WBC PRESENT, PREDOMINANTLY MONONUCLEAR NO  ORGANISMS SEEN    Culture   Final    NO GROWTH < 24 HOURS Performed at [facility] [address], [place], [place] [address]    Report Status PENDING  Incomplete    Sepsis Labs: Invalid input(s): PROCALCITONIN, LACTICIDVEN  Urine analysis:    Component Value Date/Time   COLORURINE YELLOW 01/18/2019 0435   APPEARANCEUR CLEAR 01/18/2019 0435   LABSPEC >1.046 (H) 01/18/2019 0435   PHURINE 5.0 01/18/2019 0435   GLUCOSEU NEGATIVE 01/18/2019 0435   HGBUR NEGATIVE 01/18/2019 0435   BILIRUBINUR NEGATIVE 01/18/2019 0435   BILIRUBINUR 2+ 05/17/2015 1013   KETONESUR 5 (A) 01/18/2019 0435   PROTEINUR NEGATIVE 01/18/2019 0435   UROBILINOGEN 0.2 05/17/2015 1013   NITRITE NEGATIVE 01/18/2019 0435   LEUKOCYTESUR NEGATIVE 01/18/2019 0435    Anemia Panel: Recent Labs    01/18/19 1100  VITAMINB12 459  FOLATE 19.3  FERRITIN 11*  TIBC 311  IRON 20*  RETICCTPCT 1.6    Thyroid Function Tests: No results for input(s): TSH, T4TOTAL, FREET4, T3FREE, THYROIDAB in the last 72 hours.  Lipid Profile: No results for input(s): CHOL, HDL, LDLCALC, TRIG, CHOLHDL, LDLDIRECT in the last 72 hours.  CBG: No results for input(s): GLUCAP in the last 168 hours.  HbA1C: Recent Labs    01/18/19 1100  HGBA1C 5.3    BNP (last 3 results): No results  for input(s): PROBNP in the last 8760 hours.  Cardiac Enzymes: No results for input(s): CKTOTAL, CKMB, CKMBINDEX, TROPONINI in the last 168 hours.  Coagulation Profile: Recent Labs  Lab 01/18/19 0103  INR 1.3*    Liver Function Tests: Recent Labs  Lab 01/18/19 0030  AST 41  ALT 14  ALKPHOS 118  BILITOT 1.9*  PROT 7.8  ALBUMIN 3.0*   Recent Labs  Lab 01/18/19 0030  LIPASE 23   No results for input(s): AMMONIA in the last 168 hours.  Basic Metabolic Panel: Recent Labs  Lab 01/18/19 0030 01/18/19 0103 01/19/19 0950  NA 135  --  134*  K 3.6  --  3.4*  CL 105  --  107  CO2 20*  --  21*  GLUCOSE 126*  --  98  BUN  10  --  7  CREATININE 0.98  --  0.87  CALCIUM 8.5*  --  7.7*  MG  --  2.1  --    GFR: Estimated Creatinine Clearance: 119.2 mL/min (by C-G formula based on SCr of 0.87 mg/dL).  CBC: Recent Labs  Lab 01/18/19 0030 01/18/19 0525 01/18/19 1647 01/19/19 0950  WBC 3.9* 3.4*  --  3.2*  NEUTROABS  --  2.0  --   --   HGB 6.5* 5.6* 7.2* 7.1*  HCT 24.4* 21.0* 25.4* 25.6*  MCV 78.0* 78.1*  --  82.1  PLT 217 162  --  157    Procedures:  01/18/2019-paracentesis with removal of 7.5 L.  01/19/2019-colonoscopy 01/19/2019-EGD-revealed large esophageal varices, portal hypertensive gastropathy and normal duodenum.  Microbiology summarized: [id number] pending.  Assessment & Plan: Abdominal pain/distention: Likely due to large ascites from cirrhosis and possible peritoneal carcinomatosis -Likely due to ascites.  Liver cirrhosis/ascites esophageal varices/large esophageal varices/portal hypertensive gastropathy: Likely due to alcohol. -Paracentesis on 01/18/2019 with removal of 7.5 L.  Fluid study not consistent with SBP. -EGD by Dr. Benson Norway on 01/19/2019 as above. -GI recommended repeat EGD in 3 years. -Follow acute hepatitis panel and HIV -Continue Lasix and Aldactone -Low-sodium diet -Follow peritoneal culture -Continue ceftriaxone pending peritoneal fluid culture  Ascending colon mass: concerning for neoplasm.  Patient reports unintentional weight loss of 40 pounds in the last 1 year. -Colonoscopy by Dr. Benson Norway on 01/19/2019 with biopsy.  Microcytic hypochromic anemia/iron deficiency anemia: Likely due to alcohol and possibly due to liver cirrhosis.  Patient has no melena or hematochezia. FOBT negative. EGD and colonoscopy without obvious source.  Anemia panel consistent with severe iron deficiency anemia. -Hgb 6.5> 5.6> 2u> 7.2 -Transfuse 1 more unit -We will give Feraheme on 8/7.  Unintentional weight loss: Likely combination of heavy alcohol use and possible malignancy. -Consult  dietitian-supplement per dietitian. -Multivitamins  Alcohol use disorder: Last drink 5 days prior to admission. -CIWA protocol with Ativan  Hyperbilirubinemia: Likely due to liver failure/cirrhosis -Continue trending  History of GERD: -PPI  Paroxysmal A. fib: Currently normal sinus rhythm.  Not on medications -Continue bisoprolol.  Hypertension: Normotensive -Continue bisoprolol and PRN hydralazine.  Hyperglycemia: A1c 5.3% which could be falsely low after blood transfusion.  CBG within appropriate range. -We will monitor intermittently  History of depression: Stable.  Denies SI or HI. -Continue home meds  DVT prophylaxis: SCD Code Status: Full code Family Communication: Patient and/or RN. Available if any question.  Disposition Plan: Remains inpatient Consultants: Gastroenterology   Antimicrobials: Anti-infectives (From admission, onward)   Start     Dose/Rate Route Frequency Ordered Stop   01/18/19 0430  cefTRIAXone (ROCEPHIN) 2  g in sodium chloride 0.9 % 100 mL IVPB     2 g 200 mL/hr over 30 Minutes Intravenous Daily 01/18/19 0420        Sch Meds:  Scheduled Meds:  sodium chloride   Intravenous Once   bisoprolol  5 mg Oral Daily   folic acid  1 mg Oral Daily   furosemide  20 mg Oral Daily   multivitamin with minerals  1 tablet Oral Daily   sertraline  50 mg Oral Daily   spironolactone  50 mg Oral Daily   thiamine  100 mg Oral Daily   Or   thiamine  100 mg Intravenous Daily   Continuous Infusions:  cefTRIAXone (ROCEPHIN)  IV 2 g (01/19/19 0518)   PRN Meds:.acetaminophen **OR** acetaminophen, fentaNYL (SUBLIMAZE) injection, hydrALAZINE, LORazepam **OR** LORazepam, ondansetron **OR** ondansetron (ZOFRAN) IV  35 minutes with more than 50% spent in reviewing records, counseling patient and coordinating care.  Ivery Nanney T. [place]  If 7PM-7AM, please contact night-coverage [email or web address] Password [email or web address] 01/19/2019, 2:45 PM

## 2019-01-19 NOTE — Op Note (Signed)
[facility] Patient Name: Martin Anthony Procedure Date: 01/19/2019 MRN: [id number] Attending MD: Carol Ada , MD Date of Birth: Oct 01, 1965 CSN: [id number] Age: 53 Admit Type: Inpatient Procedure:                Upper GI endoscopy Indications:              Abnormal CT of the GI tract Providers:                Carol Ada, MD, Vista Lawman, RN, Elspeth Cho                            Tech., Technician, Herbie Drape, CRNA Referring MD:              Medicines:                Propofol per Anesthesia Complications:            No immediate complications. Estimated Blood Loss:     Estimated blood loss: none. Procedure:                Pre-Anesthesia Assessment:                           - Prior to the procedure, a History and Physical                            was performed, and patient medications and                            allergies were reviewed. The patient's tolerance of                            previous anesthesia was also reviewed. The risks                            and benefits of the procedure and the sedation                            options and risks were discussed with the patient.                            All questions were answered, and informed consent                            was obtained. Prior Anticoagulants: The patient has                            taken no previous anticoagulant or antiplatelet                            agents. ASA Grade Assessment: III - A patient with                            severe systemic disease. After reviewing the risks  and benefits, the patient was deemed in                            satisfactory condition to undergo the procedure.                           - Sedation was administered by an anesthesia                            professional. Deep sedation was attained.                           After obtaining informed consent, the endoscope was                            passed  under direct vision. Throughout the                            procedure, the patient's blood pressure, pulse, and                            oxygen saturations were monitored continuously. The                            [id number]-[id number] ([id number]) Olympus pediatric colonscope                            was introduced through the mouth, and advanced to                            the second part of duodenum. The upper GI endoscopy                            was accomplished without difficulty. The patient                            tolerated the procedure well. Scope In: Scope Out: Findings:      Large (> 5 mm) varices were found in the middle third of the esophagus       and in the lower third of the esophagus.      Mild portal hypertensive gastropathy was found in the gastric fundus and       in the gastric body.      The examined duodenum was normal.      Only one varix was 5 mm in size. The other surrounding varices flattened       with insulffation. Impression:               - Large (> 5 mm) esophageal varices.                           - Portal hypertensive gastropathy.                           - Normal examined duodenum.                           -  No specimens collected. Moderate Sedation:      Not Applicable - Patient had care per Anesthesia. Recommendation:           - Return patient to hospital ward for ongoing care.                           - Low sodium diet indefinitely.                           - Continue present medications.                           - Repeat upper endoscopy in 3 years for                            surveillance. Procedure Code(s):        --- Professional ---                           [phone], Esophagogastroduodenoscopy, flexible,                            transoral; diagnostic, including collection of                            specimen(s) by brushing or washing, when performed                            (separate procedure) Diagnosis Code(s):        ---  Professional ---                           I85.00, Esophageal varices without bleeding                           K76.6, Portal hypertension                           K31.89, Other diseases of stomach and duodenum                           R93.3, Abnormal findings on diagnostic imaging of                            other parts of digestive tract CPT copyright 2019 American Medical Association. All rights reserved. The codes documented in this report are preliminary and upon coder review may  be revised to meet current compliance requirements. Carol Ada, MD Carol Ada, MD 01/19/2019 2:20:18 PM This report has been signed electronically. Number of Addenda: 0

## 2019-01-20 DIAGNOSIS — R109 Unspecified abdominal pain: Secondary | ICD-10-CM

## 2019-01-20 DIAGNOSIS — D508 Other iron deficiency anemias: Secondary | ICD-10-CM

## 2019-01-20 LAB — TYPE AND SCREEN
ABO/RH(D): O POS
Antibody Screen: NEGATIVE
Unit division: 0
Unit division: 0
Unit division: 0

## 2019-01-20 LAB — BPAM RBC
Blood Product Expiration Date: 202008292359
Blood Product Expiration Date: 202008302359
Blood Product Expiration Date: 202008302359
ISSUE DATE / TIME: 202008050542
ISSUE DATE / TIME: 202008050935
ISSUE DATE / TIME: 202008061612
Unit Type and Rh: 5100
Unit Type and Rh: 5100
Unit Type and Rh: 5100

## 2019-01-20 LAB — COMPREHENSIVE METABOLIC PANEL
ALT: 14 U/L (ref 0–44)
AST: 40 U/L (ref 15–41)
Albumin: 2.5 g/dL — ABNORMAL LOW (ref 3.5–5.0)
Alkaline Phosphatase: 94 U/L (ref 38–126)
Anion gap: 5 (ref 5–15)
BUN: 6 mg/dL (ref 6–20)
CO2: 25 mmol/L (ref 22–32)
Calcium: 8.1 mg/dL — ABNORMAL LOW (ref 8.9–10.3)
Chloride: 106 mmol/L (ref 98–111)
Creatinine, Ser: 0.8 mg/dL (ref 0.61–1.24)
GFR calc Af Amer: >60 mL/min (ref >60)
GFR calc non Af Amer: >60 mL/min (ref >60)
Glucose, Bld: 107 mg/dL — ABNORMAL HIGH (ref 70–99)
Potassium: 3.8 mmol/L (ref 3.5–5.1)
Sodium: 136 mmol/L (ref 135–145)
Total Bilirubin: 1.4 mg/dL — ABNORMAL HIGH (ref 0.3–1.2)
Total Protein: 6.5 g/dL (ref 6.5–8.1)

## 2019-01-20 LAB — CBC
HCT: 30 % — ABNORMAL LOW (ref 39.0–52.0)
HCT: 29.8 % — ABNORMAL LOW (ref 39.0–52.0)
Hemoglobin: 8.3 g/dL — ABNORMAL LOW (ref 13.0–17.0)
Hemoglobin: 8.6 g/dL — ABNORMAL LOW (ref 13.0–17.0)
MCH: 22.9 pg — ABNORMAL LOW (ref 26.0–34.0)
MCH: 23.7 pg — ABNORMAL LOW (ref 26.0–34.0)
MCHC: 27.7 g/dL — ABNORMAL LOW (ref 30.0–36.0)
MCHC: 28.9 g/dL — ABNORMAL LOW (ref 30.0–36.0)
MCV: 82.6 fL (ref 80.0–100.0)
MCV: 82.1 fL (ref 80.0–100.0)
Platelets: 149 10*3/uL — ABNORMAL LOW (ref 150–400)
Platelets: 158 10*3/uL (ref 150–400)
RBC: 3.63 MIL/uL — ABNORMAL LOW (ref 4.22–5.81)
RBC: 3.63 MIL/uL — ABNORMAL LOW (ref 4.22–5.81)
RDW: 20.7 % — ABNORMAL HIGH (ref 11.5–15.5)
RDW: 21 % — ABNORMAL HIGH (ref 11.5–15.5)
WBC: 2.9 10*3/uL — ABNORMAL LOW (ref 4.0–10.5)
WBC: 2.8 10*3/uL — ABNORMAL LOW (ref 4.0–10.5)
nRBC: 0 % (ref 0.0–0.2)
nRBC: 0 % (ref 0.0–0.2)

## 2019-01-20 LAB — MAGNESIUM: Magnesium: 2.1 mg/dL (ref 1.7–2.4)

## 2019-01-20 MED ORDER — LACTULOSE 10 GM/15ML PO SOLN
20.0000 g | Freq: Two times a day (BID) | ORAL | Status: DC
  Administered 2019-01-20 – 2019-01-22 (×5): 20 g via ORAL
  Filled 2019-01-20 (×5): qty 30

## 2019-01-20 MED ORDER — NADOLOL 20 MG PO TABS: 20.0000 mg | ORAL_TABLET | Freq: Every day | ORAL | 0 refills | Status: DC

## 2019-01-20 MED ORDER — ENSURE ENLIVE PO LIQD: 237.0000 mL | Freq: Two times a day (BID) | ORAL | 0 refills | Status: AC

## 2019-01-20 MED ORDER — SODIUM CHLORIDE 0.9 % IV SOLN
510.0000 mg | Freq: Once | INTRAVENOUS | Status: AC
  Administered 2019-01-20: 12:00:00 510 mg via INTRAVENOUS
  Filled 2019-01-20: qty 17

## 2019-01-20 MED ORDER — RIFAXIMIN 550 MG PO TABS
550.0000 mg | ORAL_TABLET | Freq: Two times a day (BID) | ORAL | Status: DC
  Administered 2019-01-20 – 2019-01-22 (×5): 550 mg via ORAL
  Filled 2019-01-20 (×6): qty 1

## 2019-01-20 MED ORDER — LACTULOSE 10 GM/15ML PO SOLN: ORAL | 1 refills | Status: DC

## 2019-01-20 MED ORDER — FERROUS SULFATE 325 (65 FE) MG PO TABS: 325.0000 mg | ORAL_TABLET | Freq: Two times a day (BID) | ORAL | 0 refills | Status: DC

## 2019-01-20 MED ORDER — SODIUM CHLORIDE 0.9 % IV SOLN
INTRAVENOUS | Status: DC | PRN
  Administered 2019-01-20: 250 mL via INTRAVENOUS

## 2019-01-20 MED ORDER — THIAMINE HCL 100 MG PO TABS: 100.0000 mg | ORAL_TABLET | Freq: Every day | ORAL | 0 refills | Status: DC

## 2019-01-20 MED ORDER — FUROSEMIDE 20 MG PO TABS: 20.0000 mg | ORAL_TABLET | Freq: Every day | ORAL | 1 refills | Status: DC

## 2019-01-20 MED ORDER — FUROSEMIDE 40 MG PO TABS
40.0000 mg | ORAL_TABLET | Freq: Every day | ORAL | Status: DC
  Administered 2019-01-21 – 2019-01-22 (×2): 40 mg via ORAL
  Filled 2019-01-20 (×2): qty 1

## 2019-01-20 MED ORDER — SPIRONOLACTONE 100 MG PO TABS
100.0000 mg | ORAL_TABLET | Freq: Every day | ORAL | Status: DC
  Administered 2019-01-21 – 2019-01-22 (×2): 100 mg via ORAL
  Filled 2019-01-20 (×2): qty 1

## 2019-01-20 MED ORDER — SPIRONOLACTONE 50 MG PO TABS: 50.0000 mg | ORAL_TABLET | Freq: Every day | ORAL | 0 refills | Status: DC

## 2019-01-20 NOTE — Progress Notes (Signed)
PROGRESS NOTE  Martin Anthony OBS:[id number] DOB: 01-16-1966   PCP: [name], MD  Patient is from: Home  DOA: 01/18/2019 LOS: 2  Brief Narrative / Interim history: 53 year old male with history of A. fib not on medication, HTN, depression, alcohol use disorder and GERD presenting with worsening abdominal pain distention for 2 weeks, and 40 pound weight loss in 1 year.   In ED, Hgb 6.5>5.6.  Creatinine 0.98.  Total bili 1.9.  INR 1.3.  FOBT negative.  EKG normal sinus rhythm.  CT abdomen and pelvis revealed enhancing ascending colon mass concerning for colonic neoplasm with large amount of ascites and peritoneal carcinomatosis and possible cirrhosis with mild esophageal varices.  Patient had paracentesis with removal of 7.5 cc peritoneal fluid on 01/18/2019.  Cytology and chemistry consistent with portal hypertension but not suggestive for SBP.  He underwent colonoscopy and EGD by Dr. Benson Norway on 01/19/2019.  Ascending colon mass biopsied.  EGD revealed large esophageal varices, portal hypertensive gastropathy and normal duodenum.   Subjective: No major events overnight of this morning.  Reported mild abdominal pain but denied nausea, vomiting, diarrhea, chest pain, dyspnea or dizziness when evaluated in the morning.  Was given iron infusion and discharged.  However, he reported worsening abdominal pain and tightness in the afternoon.  The decision was made to observe him overnight and discharge him in the morning.  May consider repeat abdominal ultrasound in the morning.  Objective: Vitals:   01/19/19 1838 01/19/19 2308 01/20/19 0623 01/20/19 1400  BP: (!) 107/58 (!) 99/57 110/78 113/75  Pulse: (!) 59 (!) 59 (!) 50 (!) 58  Resp: 16 18 18 18   Temp: 98.4 F (36.9 C) 98.7 F (37.1 C) 98.4 F (36.9 C) 98 F (36.7 C)  TempSrc: Oral Oral Oral Oral  SpO2: 100% 100% 99% 100%  Weight:      Height:        Intake/Output Summary (Last 24 hours) at 01/20/2019 1542 Last data filed at 01/20/2019 0800  Gross per 24 hour  Intake 1110 ml  Output -  Net 1110 ml   Filed Weights   01/19/19 1227  Weight: 83.9 kg    Examination:  GENERAL: No acute distress.  Appears well.  HEENT: MMM.  Vision and hearing grossly intact.  NECK: Supple.  No apparent JVD.  RESP:  No IWOB. Good air movement bilaterally. CVS:  RRR. Heart sounds normal.  ABD/GI/GU: Bowel sounds present. Soft.  Mild diffuse tenderness. MSK/EXT:  Moves extremities. No apparent deformity or edema.  SKIN: no apparent skin lesion or wound NEURO: Awake, alert and oriented appropriately.  No gross deficit.  PSYCH: Calm. Normal affect.   I have personally reviewed the following labs and images:  Radiology Studies: No results found.  Microbiology: Recent Results (from the past 240 hour(s))  SARS CORONAVIRUS 2 Nasal Swab Aptima Multi Swab     Status: None   Collection Time: 01/18/19  1:21 AM   Specimen: Aptima Multi Swab; Nasal Swab  Result Value Ref Range Status   SARS Coronavirus 2 NEGATIVE NEGATIVE Final    Comment: (NOTE) SARS-CoV-2 target nucleic acids are NOT DETECTED. The SARS-CoV-2 RNA is generally detectable in upper and lower respiratory specimens during the acute phase of infection. Negative results do not preclude SARS-CoV-2 infection, do not rule out co-infections with other pathogens, and should not be used as the sole basis for treatment or other patient management decisions. Negative results must be combined with clinical observations, patient history, and epidemiological information.  The expected result is Negative. Fact Sheet for Patients: SugarRoll.be Fact Sheet for Healthcare Providers: https://www.woods-mathews.com/ This test is not yet approved or cleared by the [place] FDA and  has been authorized for detection and/or diagnosis of SARS-CoV-2 by FDA under an Emergency Use Authorization (EUA). This EUA will remain  in effect (meaning this test can be  used) for the duration of the COVID-19 declaration under Section 56 4(b)(1) of the Act, 21 U.S.C. section 360bbb-3(b)(1), unless the authorization is terminated or revoked sooner. Performed at [facility], [place] [address], [place], [place] [address]   Body fluid culture     Status: None (Preliminary result)   Collection Time: 01/18/19  3:45 PM   Specimen: Peritoneal Fluid  Result Value Ref Range Status   Specimen Description   Final    PERITONEAL Performed at [place] [address] [place], [place] [address]    Special Requests   Final    NONE Performed at [facility], [place] [address], [place], [place] [address]    Gram Stain   Final    FEW WBC PRESENT, PREDOMINANTLY MONONUCLEAR NO ORGANISMS SEEN    Culture   Final    NO GROWTH 2 DAYS Performed at [place] [address], [place], [place] [address]    Report Status PENDING  Incomplete    Sepsis Labs: Invalid input(s): PROCALCITONIN, LACTICIDVEN  Urine analysis:    Component Value Date/Time   COLORURINE YELLOW 01/18/2019 0435   APPEARANCEUR CLEAR 01/18/2019 0435   LABSPEC >1.046 (H) 01/18/2019 0435   PHURINE 5.0 01/18/2019 0435   GLUCOSEU NEGATIVE 01/18/2019 0435   HGBUR NEGATIVE 01/18/2019 0435   BILIRUBINUR NEGATIVE 01/18/2019 0435   BILIRUBINUR 2+ 05/17/2015 1013   KETONESUR 5 (A) 01/18/2019 0435   PROTEINUR NEGATIVE 01/18/2019 0435   UROBILINOGEN 0.2 05/17/2015 1013   NITRITE NEGATIVE 01/18/2019 0435   LEUKOCYTESUR NEGATIVE 01/18/2019 0435    Anemia Panel: Recent Labs    01/18/19 1100  VITAMINB12 459  FOLATE 19.3  FERRITIN 11*  TIBC 311  IRON 20*  RETICCTPCT 1.6    Thyroid Function Tests: No results for input(s): TSH, T4TOTAL, FREET4, T3FREE, THYROIDAB in the last 72 hours.  Lipid Profile: No results for input(s): CHOL, HDL, LDLCALC, TRIG, CHOLHDL, LDLDIRECT in the last 72 hours.  CBG: No results for input(s): GLUCAP in the last 168  hours.  HbA1C: Recent Labs    01/18/19 1100  HGBA1C 5.3    BNP (last 3 results): No results for input(s): PROBNP in the last 8760 hours.  Cardiac Enzymes: No results for input(s): CKTOTAL, CKMB, CKMBINDEX, TROPONINI in the last 168 hours.  Coagulation Profile: Recent Labs  Lab 01/18/19 0103  INR 1.3*    Liver Function Tests: Recent Labs  Lab 01/18/19 0030 01/20/19 0440  AST 41 40  ALT 14 14  ALKPHOS 118 94  BILITOT 1.9* 1.4*  PROT 7.8 6.5  ALBUMIN 3.0* 2.5*   Recent Labs  Lab 01/18/19 0030  LIPASE 23   No results for input(s): AMMONIA in the last 168 hours.  Basic Metabolic Panel: Recent Labs  Lab 01/18/19 0030 01/18/19 0103 01/19/19 0950 01/20/19 0440  NA 135  --  134* 136  K 3.6  --  3.4* 3.8  CL 105  --  107 106  CO2 20*  --  21* 25  GLUCOSE 126*  --  98 107*  BUN 10  --  7 6  CREATININE 0.98  --  0.87 0.80  CALCIUM 8.5*  --  7.7* 8.1*  MG  --  2.1  --  2.1   GFR: Estimated Creatinine Clearance: 129.6 mL/min (by C-G formula based on SCr of 0.8 mg/dL).  CBC: Recent Labs  Lab 01/18/19 0030 01/18/19 0525 01/18/19 1647 01/19/19 0950 01/19/19 2023 01/20/19 0440 01/20/19 1332  WBC 3.9* 3.4*  --  3.2*  --  2.9* 2.8*  NEUTROABS  --  2.0  --   --   --   --   --   HGB 6.5* 5.6* 7.2* 7.1* 9.0* 8.3* 8.6*  HCT 24.4* 21.0* 25.4* 25.6* 32.3* 30.0* 29.8*  MCV 78.0* 78.1*  --  82.1  --  82.6 82.1  PLT 217 162  --  157  --  149* 158    Procedures:  01/18/2019-paracentesis with removal of 7.5 L.  01/19/2019-colonoscopy 01/19/2019-EGD-revealed large esophageal varices, portal hypertensive gastropathy and normal duodenum.  Microbiology summarized: [id number] pending.  Assessment & Plan: Abdominal pain/distention: Likely due to large ascites from cirrhosis and possible peritoneal carcinomatosis -Likely due to ascites.  Liver cirrhosis/ascites esophageal varices/large esophageal varices/portal hypertensive gastropathy: Likely due to alcohol.  -Paracentesis on 01/18/2019 with removal of 7.5 L.  Fluid study and culture not consistent with SBP. -EGD by Dr. Benson Norway on 01/19/2019 as above. -GI recommended repeat EGD in 3 years. -Acute hepatitis panel and HIV negative. -Increase Lasix and Bactrim per GI recommendation. -Low-sodium diet -Discontinue ceftriaxone.  Start rifaximin -Start lactulose  Ascending colon mass: concerning for neoplasm.  Patient reports unintentional weight loss of 40 pounds in the last 1 year. -Colonoscopy by Dr. Benson Norway on 01/19/2019 with biopsy. -Follow pathology  Microcytic hypochromic anemia/iron deficiency anemia: Likely due to alcohol and possibly due to liver cirrhosis.  Patient has no melena or hematochezia. FOBT negative. EGD and colonoscopy without obvious source.  Anemia panel consistent with severe iron deficiency anemia. -Hgb 6.5> 5.6> 2u> 7.2>1u>8.3 -Transfused Feraheme on 01/20/2019. -Oral iron.  Unintentional weight loss: Likely combination of heavy alcohol use and possible malignancy. -Consult dietitian-supplement per dietitian. -Multivitamins  Alcohol use disorder: Last drink 5 days prior to admission. -Encourage cessation. -CIWA protocol with Ativan  Hyperbilirubinemia: Likely due to liver failure/cirrhosis.  Improving. -Continue trending  History of GERD: -PPI  Paroxysmal A. fib: Currently normal sinus rhythm.  Not on medications -Continue bisoprolol.  -Nadolol on discharge  Hypertension: Normotensive -Continue bisoprolol and PRN hydralazine.  Hyperglycemia: A1c 5.3% which could be falsely low after blood transfusion.  CBG within appropriate range. -We will monitor intermittently  History of depression: Stable.  Denies SI or HI. -Continue home meds  DVT prophylaxis: SCD Code Status: Full code Family Communication: Patient and/or RN. Available if any question.  Disposition Plan: Anticipate discharge in the next 24 hours if abdominal pain and distention improves. Consultants:  Gastroenterology   Antimicrobials: Anti-infectives (From admission, onward)   Start     Dose/Rate Route Frequency Ordered Stop   01/20/19 1000  rifaximin (XIFAXAN) tablet 550 mg     550 mg Oral 2 times daily 01/20/19 0745     01/18/19 0430  cefTRIAXone (ROCEPHIN) 2 g in sodium chloride 0.9 % 100 mL IVPB  Status:  Discontinued     2 g 200 mL/hr over 30 Minutes Intravenous Daily 01/18/19 0420 01/20/19 0743      Sch Meds:  Scheduled Meds: . sodium chloride   Intravenous Once  . bisoprolol  5 mg Oral Daily  . folic acid  1 mg Oral Daily  . furosemide  20 mg Oral Daily  . lactulose  20 g Oral BID  . multivitamin with minerals  1 tablet Oral Daily  . rifaximin  550 mg Oral BID  . sertraline  50 mg Oral Daily  . spironolactone  50 mg Oral Daily  . thiamine  100 mg Oral Daily   Or  . thiamine  100 mg Intravenous Daily   Continuous Infusions: . sodium chloride 250 mL (01/20/19 0639)   PRN Meds:.sodium chloride, acetaminophen **OR** acetaminophen, fentaNYL (SUBLIMAZE) injection, hydrALAZINE, LORazepam **OR** LORazepam, ondansetron **OR** ondansetron (ZOFRAN) IV  Taye T. Ord  If 7PM-7AM, please contact night-coverage [email or web address] Password [facility] 01/20/2019, 3:42 PM

## 2019-01-20 NOTE — Progress Notes (Signed)
Pt stated that he feels that  His abdomen is tighter than it was theis am and thaqt his pain has worsened. I notified Dr Cyndia Skeeters . He stated to continue to observe PT:S abdomen and he may be D/C "D 01/21/2019.

## 2019-01-20 NOTE — Progress Notes (Signed)
Subjective: Feeling well, but not back to his baseline.  Objective: Vital signs in last 24 hours: Temp:  [97.9 F (36.6 C)-98.7 F (37.1 C)] 98 F (36.7 C) (08/07 1400) Pulse Rate:  [50-71] 58 (08/07 1400) Resp:  [15-19] 18 (08/07 1400) BP: (99-125)/(47-78) 113/75 (08/07 1400) SpO2:  [95 %-100 %] 100 % (08/07 1400) Last BM Date: 01/18/19  Intake/Output from previous day: 08/06 0701 - 08/07 0700 In: 1370 [P.O.:300; I.V.:750; Blood:320] Out: -  Intake/Output this shift: Total I/O In: 240 [P.O.:240] Out: -   General appearance: alert and no distress GI: soft, non-tender; bowel sounds normal; no masses,  no organomegaly  Lab Results: Recent Labs    01/19/19 0950 01/19/19 2023 01/20/19 0440 01/20/19 1332  WBC 3.2*  --  2.9* 2.8*  HGB 7.1* 9.0* 8.3* 8.6*  HCT 25.6* 32.3* 30.0* 29.8*  PLT 157  --  149* 158   BMET Recent Labs    01/18/19 0030 01/19/19 0950 01/20/19 0440  NA 135 134* 136  K 3.6 3.4* 3.8  CL 105 107 106  CO2 20* 21* 25  GLUCOSE 126* 98 107*  BUN 10 7 6   CREATININE 0.98 0.87 0.80  CALCIUM 8.5* 7.7* 8.1*   LFT Recent Labs    01/20/19 0440  PROT 6.5  ALBUMIN 2.5*  AST 40  ALT 14  ALKPHOS 94  BILITOT 1.4*   PT/INR Recent Labs    01/18/19 0103  LABPROT 16.0*  INR 1.3*   Hepatitis Panel Recent Labs    01/18/19 1100  HEPBSAG Negative  HCVAB <0.1  HEPAIGM Negative  HEPBIGM Negative   C-Diff No results for input(s): CDIFFTOX in the last 72 hours. Fecal Lactopherrin No results for input(s): FECLLACTOFRN in the last 72 hours.  Studies/Results: US Paracentesis  Result Date: 01/18/2019 INDICATION: Patient with history of colon mass, probable cirrhosis by imaging, alcohol abuse, ascites. Request made for diagnostic and therapeutic paracentesis. EXAM: ULTRASOUND GUIDED DIAGNOSTIC AND THERAPEUTIC PARACENTESIS MEDICATIONS: None COMPLICATIONS: None immediate. PROCEDURE: Informed written consent was obtained from the patient after a  discussion of the risks, benefits and alternatives to treatment. A timeout was performed prior to the initiation of the procedure. Initial ultrasound scanning demonstrates a large amount of ascites within the right lower abdominal quadrant. The right lower abdomen was prepped and draped in the usual sterile fashion. 1% lidocaine was used for local anesthesia. Following this, a 19 gauge, 7-cm, Yueh catheter was introduced. An ultrasound image was saved for documentation purposes. The paracentesis was performed. The catheter was removed and a dressing was applied. The patient tolerated the procedure well without immediate post procedural complication. FINDINGS: A total of approximately 7.4 liters of yellow fluid was removed. Samples were sent to the laboratory as requested by the clinical team. IMPRESSION: Successful ultrasound-guided diagnostic and therapeutic paracentesis yielding 7.4 liters of peritoneal fluid. Read by: Rowe Robert, PA-C Electronically Signed   By: Lucrezia Europe M.D.   On: 01/18/2019 15:31    Medications:  Scheduled: . sodium chloride   Intravenous Once  . bisoprolol  5 mg Oral Daily  . folic acid  1 mg Oral Daily  . furosemide  20 mg Oral Daily  . lactulose  20 g Oral BID  . multivitamin with minerals  1 tablet Oral Daily  . rifaximin  550 mg Oral BID  . sertraline  50 mg Oral Daily  . spironolactone  50 mg Oral Daily  . thiamine  100 mg Oral Daily   Or  . thiamine  100 mg Intravenous Daily   Continuous: . sodium chloride 250 mL (01/20/19 0639)    Assessment/Plan: 1) ETOH cirrhosis.  SAAG >1.1 2) Ascites from portal HTN. 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   The biopsy result from the ascending colon lesion will determine if the patient requires surgical resection or possibly endoscopic resection.  His ascitic fluid was negative for any malignant cells and the SAAG is >1.1.  He was counseled about never drinking ETOH again and he is to remain on a 2 gram  sodium diet.  His diuretics will be increased as he feels that he may be reaccumulating ascites.  Plan: 1) Increase diuretics to lasix 40 mg QD and spironolactone 100 mg QD. 2) Await pathology results. 3) Okay with D/C home and follow up in the office in 1-2 weeks.  LOS: 2 days   Martin Anthony D 01/20/2019, 2:03 PM

## 2019-01-20 NOTE — Discharge Instructions (Addendum)
Abdominal Pain, Adult  Many things can cause belly (abdominal) pain. Most times, belly pain is not dangerous. Many cases of belly pain can be watched and treated at home. Sometimes belly pain is serious, though. Your doctor will try to find the cause of your belly pain. Follow these instructions at home:  Take over-the-counter and prescription medicines only as told by your doctor. Do not take medicines that help you poop (laxatives) unless told to by your doctor.  Drink enough fluid to keep your pee (urine) clear or pale yellow.  Watch your belly pain for any changes.  Keep all follow-up visits as told by your doctor. This is important. Contact a doctor if:  Your belly pain changes or gets worse.  You are not hungry, or you lose weight without trying.  You are having trouble pooping (constipated) or have watery poop (diarrhea) for more than 2-3 days.  You have pain when you pee or poop.  Your belly pain wakes you up at night.  Your pain gets worse with meals, after eating, or with certain foods.  You are throwing up and cannot keep anything down.  You have a fever. Get help right away if:  Your pain does not go away as soon as your doctor says it should.  You cannot stop throwing up.  Your pain is only in areas of your belly, such as the right side or the left lower part of the belly.  You have bloody or black poop, or poop that looks like tar.  You have very bad pain, cramping, or bloating in your belly.  You have signs of not having enough fluid or water in your body (dehydration), such as: ? Dark pee, very little pee, or no pee. ? Cracked lips. ? Dry mouth. ? Sunken eyes. ? Sleepiness. ? Weakness. This information is not intended to replace advice given to you by your health care provider. Make sure you discuss any questions you have with your health care provider. Document Released: 11/18/2007 Document Revised: 12/20/2015 Document Reviewed:  11/13/2015 Elsevier Interactive Patient Education  [place]. Alcoholic Liver Disease  Alcoholic liver disease is liver damage that is caused by drinking a lot of alcohol for a long time. If you have this disease, you must stop drinking alcohol. Follow these instructions at home:   Do not drink alcohol. Follow your treatment plan. Work with your doctor if you need help.  Think about joining an alcohol support group.  Take over-the-counter and prescription medicines only as told by your doctor. These include vitamins.  Do not use medicines or eat foods that have alcohol in them, unless your doctor says that it is safe.  Follow instructions from your doctor about eating a healthy diet.  Keep all follow-up visits as told by your doctor. This is important. Contact a doctor if:  You get a fever.  Your skin starts to look more yellow, pale, or dark.  You get headaches. Get help right away if:  You throw up (vomit) blood.  You have bright red blood in your poop (stool).  Your poop looks black or looks like tar.  You have trouble: ? Thinking. ? Walking. ? Balancing. ? Breathing. Summary  Alcoholic liver disease is liver damage that is caused by drinking a lot of alcohol for a long time.  If you have this disease, you must stop drinking alcohol. Follow your treatment plan, and work with your doctor as needed.  Think about joining an alcohol  support group. This information is not intended to replace advice given to you by your health care provider. Make sure you discuss any questions you have with your health care provider. Document Released: 03/29/2009 Document Revised: 09/20/2018 Document Reviewed: 02/19/2017 Elsevier Patient Education  [place].   Ascites  Ascites is a collection of too much fluid in the abdomen. Ascites can range from mild to severe. If ascites is not treated, it can get worse. What are the causes? This condition may be caused  by:  A liver condition called cirrhosis. This is the most common cause of ascites.  Long-term (chronic) or alcoholic hepatitis.  Infection or inflammation in the abdomen.  Cancer in the abdomen.  Heart failure.  Kidney disease.  Inflammation of the pancreas.  Clots in the veins of the liver. What are the signs or symptoms? Symptoms of this condition include:  A feeling of fullness in the abdomen. This is common.  An increase in the size of the abdomen or waist.  Swelling in the legs.  Swelling of the scrotum (in men).  Difficulty breathing.  Pain in the abdomen.  Sudden weight gain. If the condition is mild, you may not have symptoms. How is this diagnosed? This condition is diagnosed based on your medical history and a physical exam. Your health care provider may order imaging tests, such as an ultrasound or CT scan of your abdomen. How is this treated? Treatment for this condition depends on the cause of the ascites. It may include:  Taking a pill to make you urinate. This is called a water pill (diuretic pill).  Strictly reducing your salt (sodium) intake. Salt can cause extra fluid to be kept (retained) in the body, and this makes ascites worse.  Having a procedure to remove fluid from your abdomen (paracentesis).  Having a procedure that connects two of the major veins within your liver and relieves pressure on your liver. This is called a TIPS procedure (transjugular intrahepatic portosystemic shunt procedure).  Placement of a drainage catheter (peritoneovenous shunt) to manage the extra fluid in the abdomen. Ascites may go away or improve when the condition that caused it is treated. Follow these instructions at home:  Keep track of your weight. To do this, weigh yourself at the same time every day and write down your weight.  Keep track of how much you drink and any changes in how much or how often you urinate.  Follow any instructions that your health  care provider gives you about how much to drink.  Try not to eat salty (high-sodium) foods.  Take over-the-counter and prescription medicines only as told by your health care provider.  Keep all follow-up visits as told by your health care provider. This is important.  Report any changes in your health to your health care provider, especially if you develop new symptoms or your symptoms get worse. Contact a health care provider if:  You gain more than 3 lb (1.36 kg) in 3 days.  Your waist size increases.  You have new swelling in your legs.  The swelling in your legs gets worse. Get help right away if:  You have a fever.  You are confused.  You have new or worsening breathing trouble.  You have new or worsening pain in your abdomen.  You have new or worsening swelling in the scrotum (in men). Summary  Ascites is a collection of too much fluid in the abdomen.  Ascites may be caused by various conditions, such  as cirrhosis, hepatitis, cancer, or congestive heart failure.  Symptoms may include swelling of the abdomen and other areas due to extra fluid in the body.  Treatments may involve dietary changes, medicines, or procedures. This information is not intended to replace advice given to you by your health care provider. Make sure you discuss any questions you have with your health care provider. Document Released: 06/01/2005 Document Revised: 05/03/2018 Document Reviewed: 02/11/2017 Elsevier Patient Education  2020 [employer].

## 2019-01-21 ENCOUNTER — Inpatient Hospital Stay (HOSPITAL_COMMUNITY): Payer: Self-pay

## 2019-01-21 DIAGNOSIS — R188 Other ascites: Secondary | ICD-10-CM

## 2019-01-21 DIAGNOSIS — F1010 Alcohol abuse, uncomplicated: Secondary | ICD-10-CM

## 2019-01-21 DIAGNOSIS — K6389 Other specified diseases of intestine: Secondary | ICD-10-CM

## 2019-01-21 DIAGNOSIS — K7031 Alcoholic cirrhosis of liver with ascites: Principal | ICD-10-CM

## 2019-01-21 LAB — CBC
HCT: 28.2 % — ABNORMAL LOW (ref 39.0–52.0)
Hemoglobin: 7.9 g/dL — ABNORMAL LOW (ref 13.0–17.0)
MCH: 23 pg — ABNORMAL LOW (ref 26.0–34.0)
MCHC: 28 g/dL — ABNORMAL LOW (ref 30.0–36.0)
MCV: 82 fL (ref 80.0–100.0)
Platelets: 126 10*3/uL — ABNORMAL LOW (ref 150–400)
RBC: 3.44 MIL/uL — ABNORMAL LOW (ref 4.22–5.81)
RDW: 22.1 % — ABNORMAL HIGH (ref 11.5–15.5)
WBC: 3.5 10*3/uL — ABNORMAL LOW (ref 4.0–10.5)
nRBC: 0 % (ref 0.0–0.2)

## 2019-01-21 LAB — COMPREHENSIVE METABOLIC PANEL
ALT: 18 U/L (ref 0–44)
AST: 58 U/L — ABNORMAL HIGH (ref 15–41)
Albumin: 2.4 g/dL — ABNORMAL LOW (ref 3.5–5.0)
Alkaline Phosphatase: 106 U/L (ref 38–126)
Anion gap: 7 (ref 5–15)
BUN: 9 mg/dL (ref 6–20)
CO2: 21 mmol/L — ABNORMAL LOW (ref 22–32)
Calcium: 8.2 mg/dL — ABNORMAL LOW (ref 8.9–10.3)
Chloride: 104 mmol/L (ref 98–111)
Creatinine, Ser: 0.93 mg/dL (ref 0.61–1.24)
GFR calc Af Amer: >60 mL/min (ref >60)
GFR calc non Af Amer: >60 mL/min (ref >60)
Glucose, Bld: 152 mg/dL — ABNORMAL HIGH (ref 70–99)
Potassium: 4 mmol/L (ref 3.5–5.1)
Sodium: 132 mmol/L — ABNORMAL LOW (ref 135–145)
Total Bilirubin: 1.7 mg/dL — ABNORMAL HIGH (ref 0.3–1.2)
Total Protein: 6.1 g/dL — ABNORMAL LOW (ref 6.5–8.1)

## 2019-01-21 LAB — MAGNESIUM: Magnesium: 1.9 mg/dL (ref 1.7–2.4)

## 2019-01-21 MED ORDER — LIDOCAINE HCL 1 % IJ SOLN
INTRAMUSCULAR | Status: AC
  Filled 2019-01-21: qty 10

## 2019-01-21 MED ORDER — OXYCODONE HCL 5 MG PO TABS
5.0000 mg | ORAL_TABLET | Freq: Four times a day (QID) | ORAL | Status: DC | PRN
  Administered 2019-01-21 – 2019-01-22 (×2): 5 mg via ORAL
  Filled 2019-01-21 (×2): qty 1

## 2019-01-21 MED ORDER — ALUM & MAG HYDROXIDE-SIMETH 200-200-20 MG/5ML PO SUSP
30.0000 mL | Freq: Once | ORAL | Status: AC
  Administered 2019-01-21: 30 mL via ORAL
  Filled 2019-01-21: qty 30

## 2019-01-21 MED ORDER — FERROUS SULFATE 325 (65 FE) MG PO TABS
325.0000 mg | ORAL_TABLET | Freq: Two times a day (BID) | ORAL | Status: DC
  Administered 2019-01-22: 325 mg via ORAL
  Filled 2019-01-21: qty 1

## 2019-01-21 NOTE — Progress Notes (Signed)
PROGRESS NOTE  Martin Anthony XNT:[id number] DOB: Dec 01, 1965   PCP: [name], MD  Patient is from: Home  DOA: 01/18/2019 LOS: 3  Brief Narrative / Interim history: 53 year old male with history of A. fib not on medication, HTN, depression, alcohol use disorder and GERD presenting with worsening abdominal pain distention for 2 weeks, and 40 pound weight loss in 1 year.   In ED, Hgb 6.5>5.6.  Creatinine 0.98.  Total bili 1.9.  INR 1.3.  FOBT negative.  EKG normal sinus rhythm.  CT abdomen and pelvis revealed enhancing ascending colon mass concerning for colonic neoplasm with large amount of ascites and peritoneal carcinomatosis and possible cirrhosis with mild esophageal varices.  Patient had paracentesis with removal of 7.5 cc peritoneal fluid on 01/18/2019.  Cytology and chemistry consistent with portal hypertension but not suggestive for SBP.  He underwent colonoscopy and EGD by Dr. Benson Norway on 01/19/2019.  Ascending colon mass biopsied.  EGD revealed large esophageal varices, portal hypertensive gastropathy and normal duodenum.   Subjective: No major events overnight of this morning.  He feels his abdomen is distended again.  No significant pain.  Denies nausea or vomiting.  Had a bowel movement.  Denies hematochezia or melena.  Denies chest pain or dyspnea.  Objective: Vitals:   01/20/19 1400 01/20/19 2316 01/21/19 0630 01/21/19 0632  BP: 113/75 120/66 (!) 97/47 (!) 99/52  Pulse: (!) 58 (!) 58 60 61  Resp: 18 20 20    Temp: 98 F (36.7 C) 98.6 F (37 C) 98.9 F (37.2 C)   TempSrc: Oral Oral Oral   SpO2: 100% 100% 95% 94%  Weight:      Height:        Intake/Output Summary (Last 24 hours) at 01/21/2019 1301 Last data filed at 01/20/2019 1800 Gross per 24 hour  Intake 480 ml  Output -  Net 480 ml   Filed Weights   01/19/19 1227  Weight: 83.9 kg    Examination:  GENERAL: No acute distress.  Appears well.  HEENT: MMM.  Vision and hearing grossly intact.  NECK: Supple.  No  apparent JVD.  RESP:  No IWOB. Good air movement bilaterally. CVS:  RRR. Heart sounds normal.  ABD/GI/GU: Bowel sounds present. Soft.  Moderate ascites.  Mild tenderness MSK/EXT:  Moves extremities. No apparent deformity or edema.  SKIN: no apparent skin lesion or wound NEURO: Awake, alert and oriented appropriately.  No gross deficit.  PSYCH: Calm. Normal affect.   I have personally reviewed the following labs and images:  Radiology Studies: US Abdomen Complete  Result Date: 01/21/2019 CLINICAL DATA:  Abdominal pain. EXAM: ABDOMEN ULTRASOUND COMPLETE COMPARISON:  CT of 01/18/2019. FINDINGS: Gallbladder: Contracted gallbladder. No stones or pericholecystic fluid. Mild wall thickening at 4 mm is likely due to underdistention. Common bile duct: Diameter: Normal, 2 mm. Liver: Irregular hepatic capsule with nodularity, consistent with moderate cirrhosis. Portal vein is patent on color Doppler imaging with normal direction of blood flow towards the liver. IVC: No abnormality visualized. Pancreas: Visualized portion unremarkable. Spleen: Size and appearance within normal limits. Right Kidney: Length: 10.6 cm. Echogenicity within normal limits. No mass or hydronephrosis visualized. Left Kidney: Length: 9.0 cm. Echogenicity within normal limits. No mass or hydronephrosis visualized. Abdominal aorta: No aneurysm visualized. Other findings: Moderate volume ascites. IMPRESSION: 1. Collapse gallbladder which could relate to recent meal or a component of gallbladder dysfunction. No findings of acute cholecystitis. 2. Cirrhosis and ascites. Electronically Signed   By: Adria Devon.D.  On: 01/21/2019 10:20    Microbiology: Recent Results (from the past 240 hour(s))  SARS CORONAVIRUS 2 Nasal Swab Aptima Multi Swab     Status: None   Collection Time: 01/18/19  1:21 AM   Specimen: Aptima Multi Swab; Nasal Swab  Result Value Ref Range Status   SARS Coronavirus 2 NEGATIVE NEGATIVE Final    Comment: (NOTE)  SARS-CoV-2 target nucleic acids are NOT DETECTED. The SARS-CoV-2 RNA is generally detectable in upper and lower respiratory specimens during the acute phase of infection. Negative results do not preclude SARS-CoV-2 infection, do not rule out co-infections with other pathogens, and should not be used as the sole basis for treatment or other patient management decisions. Negative results must be combined with clinical observations, patient history, and epidemiological information. The expected result is Negative. Fact Sheet for Patients: SugarRoll.be Fact Sheet for Healthcare Providers: https://www.woods-mathews.com/ This test is not yet approved or cleared by the [place] FDA and  has been authorized for detection and/or diagnosis of SARS-CoV-2 by FDA under an Emergency Use Authorization (EUA). This EUA will remain  in effect (meaning this test can be used) for the duration of the COVID-19 declaration under Section 56 4(b)(1) of the Act, 21 U.S.C. section 360bbb-3(b)(1), unless the authorization is terminated or revoked sooner. Performed at [facility], [place] [address], [place], [place] [address]   Body fluid culture     Status: None (Preliminary result)   Collection Time: 01/18/19  3:45 PM   Specimen: Peritoneal Fluid  Result Value Ref Range Status   Specimen Description   Final    PERITONEAL Performed at [facility] [address] [place], [place] [address]    Special Requests   Final    NONE Performed at [facility], [place] [address] [place], [place] [address]    Gram Stain   Final    FEW WBC PRESENT, PREDOMINANTLY MONONUCLEAR NO ORGANISMS SEEN    Culture   Final    NO GROWTH 3 DAYS Performed at [facility] [address] [place], [place] [address]    Report Status PENDING  Incomplete    Sepsis Labs: Invalid input(s): PROCALCITONIN, LACTICIDVEN  Urine analysis:     Component Value Date/Time   COLORURINE YELLOW 01/18/2019 [place] 01/18/2019 0435   LABSPEC >1.046 (H) 01/18/2019 0435   PHURINE 5.0 01/18/2019 [place] 01/18/2019 0435   HGBUR NEGATIVE 01/18/2019 0435   BILIRUBINUR NEGATIVE 01/18/2019 0435   BILIRUBINUR 2+ 05/17/2015 1013   KETONESUR 5 (A) 01/18/2019 0435   PROTEINUR NEGATIVE 01/18/2019 0435   UROBILINOGEN 0.2 05/17/2015 1013   NITRITE NEGATIVE 01/18/2019 0435   LEUKOCYTESUR NEGATIVE 01/18/2019 0435    Anemia Panel: No results for input(s): VITAMINB12, FOLATE, FERRITIN, TIBC, IRON, RETICCTPCT in the last 72 hours.  Thyroid Function Tests: No results for input(s): TSH, T4TOTAL, FREET4, T3FREE, THYROIDAB in the last 72 hours.  Lipid Profile: No results for input(s): CHOL, HDL, LDLCALC, TRIG, CHOLHDL, LDLDIRECT in the last 72 hours.  CBG: No results for input(s): GLUCAP in the last 168 hours.  HbA1C: No results for input(s): HGBA1C in the last 72 hours.  BNP (last 3 results): No results for input(s): PROBNP in the last 8760 hours.  Cardiac Enzymes: No results for input(s): CKTOTAL, CKMB, CKMBINDEX, TROPONINI in the last 168 hours.  Coagulation Profile: Recent Labs  Lab 01/18/19 0103  INR 1.3*    Liver Function Tests: Recent Labs  Lab 01/18/19 0030 01/20/19 0440 01/21/19 0503  AST 41 40 58*  ALT 14 14 18   ALKPHOS 118 94 106  BILITOT 1.9* 1.4* 1.7*  PROT 7.8 6.5 6.1*  ALBUMIN 3.0* 2.5* 2.4*   Recent Labs  Lab 01/18/19 0030  LIPASE 23   No results for input(s): AMMONIA in the last 168 hours.  Basic Metabolic Panel: Recent Labs  Lab 01/18/19 0030 01/18/19 0103 01/19/19 0950 01/20/19 0440 01/21/19 0503  NA 135  --  134* 136 132*  K 3.6  --  3.4* 3.8 4.0  CL 105  --  107 106 104  CO2 20*  --  21* 25 21*  GLUCOSE 126*  --  98 107* 152*  BUN 10  --  7 6 9   CREATININE 0.98  --  0.87 0.80 0.93  CALCIUM 8.5*  --  7.7* 8.1* 8.2*  MG  --  2.1  --  2.1 1.9   GFR:  Estimated Creatinine Clearance: 111.5 mL/min (by C-G formula based on SCr of 0.93 mg/dL).  CBC: Recent Labs  Lab 01/18/19 0525  01/19/19 0950 01/19/19 2023 01/20/19 0440 01/20/19 1332 01/21/19 0503  WBC 3.4*  --  3.2*  --  2.9* 2.8* 3.5*  NEUTROABS 2.0  --   --   --   --   --   --   HGB 5.6*   < > 7.1* 9.0* 8.3* 8.6* 7.9*  HCT 21.0*   < > 25.6* 32.3* 30.0* 29.8* 28.2*  MCV 78.1*  --  82.1  --  82.6 82.1 82.0  PLT 162  --  157  --  149* 158 126*   < > = values in this interval not displayed.    Procedures:  01/18/2019-paracentesis with removal of 7.5 L.  01/19/2019-colonoscopy 01/19/2019-EGD-revealed large esophageal varices, portal hypertensive gastropathy and normal duodenum.  Microbiology summarized: [id number] pending.  Assessment & Plan: Abdominal pain/distention: Likely due to large ascites from cirrhosis and possible peritoneal carcinomatosis -Likely due to ascites.  Liver cirrhosis/ascites/large esophageal varices/portal hypertensive gastropathy: Likely due to alcohol.  -Paracentesis on 01/18/2019 with removal of 7.5 L.  Fluid study and culture not consistent with SBP. -EGD by Dr. Benson Norway on 01/19/2019 as above. -GI recommended repeat EGD in 3 years. -Acute hepatitis panel and HIV negative. -Increase Lasix and Aldactone per GI recommendation. -Low-sodium diet -Discontinue ceftriaxone.  Start rifaximin -Start lactulose 20 mg twice daily -Exam with significant ascites.  Repeat abdominal ultrasound with moderate ascites. -Repeat paracentesis today.  Ascending colon mass: concerning for neoplasm.  Patient reports unintentional weight loss of 40 pounds in the last 1 year. -Colonoscopy by Dr. Benson Norway on 01/19/2019 with biopsy. -Follow pathology  Pancytopenia/iron deficiency anemia: Likely due to alcohol and possibly due to liver cirrhosis.  Patient has no melena or hematochezia. FOBT negative. EGD and colonoscopy without obvious source.  Spleen size and appearance normal on ultrasound.   Anemia panel consistent with severe iron deficiency anemia. -Hgb 6.5> 5.6> 2u> 7.2>1u>8.3>8.0 -Transfused Feraheme on 01/20/2019. -Continue oral iron  Unintentional weight loss: Likely combination of heavy alcohol use and possible malignancy. -Consult dietitian-supplement per dietitian. -Multivitamins  Alcohol use disorder: Last drink 5 days prior to admission. -Encourage cessation. -CIWA protocol with Ativan  Elevated liver enzymes/hyperbilirubinemia: Likely due to liver failure/cirrhosis.  -Continue trending  History of GERD: -PPI  Paroxysmal A. fib: Currently normal sinus rhythm.  Not on medications -Continue bisoprolol.  -Nadolol on discharge  Hypertension: Normotensive -Continue bisoprolol and PRN hydralazine. -Diuretics as above  Hyperglycemia: A1c  5.3% which could be falsely low after blood transfusion.  CBG within appropriate range. -We will monitor intermittently  History of depression: Stable.  Denies SI or HI. -Continue home meds  DVT prophylaxis: SCD Code Status: Full code Family Communication: Patient and/or RN. Available if any question.  Disposition Plan: Remains inpatient for recurrent ascites..  Anticipate discharge in the next 24 hours. Consultants: Gastroenterology   Antimicrobials: Anti-infectives (From admission, onward)   Start     Dose/Rate Route Frequency Ordered Stop   01/20/19 1000  rifaximin (XIFAXAN) tablet 550 mg     550 mg Oral 2 times daily 01/20/19 0745     01/18/19 0430  cefTRIAXone (ROCEPHIN) 2 g in sodium chloride 0.9 % 100 mL IVPB  Status:  Discontinued     2 g 200 mL/hr over 30 Minutes Intravenous Daily 01/18/19 0420 01/20/19 0743      Sch Meds:  Scheduled Meds: . sodium chloride   Intravenous Once  . bisoprolol  5 mg Oral Daily  . folic acid  1 mg Oral Daily  . furosemide  40 mg Oral Daily  . lactulose  20 g Oral BID  . multivitamin with minerals  1 tablet Oral Daily  . rifaximin  550 mg Oral BID  . sertraline  50 mg Oral  Daily  . spironolactone  100 mg Oral Daily  . thiamine  100 mg Oral Daily   Or  . thiamine  100 mg Intravenous Daily   Continuous Infusions: . sodium chloride 250 mL (01/20/19 0639)   PRN Meds:.sodium chloride, acetaminophen **OR** acetaminophen, fentaNYL (SUBLIMAZE) injection, hydrALAZINE, ondansetron **OR** ondansetron (ZOFRAN) IV  Brallan Denio T. [place]  If 7PM-7AM, please contact night-coverage [email or web address] Password [email or web address] 01/21/2019, 1:01 PM

## 2019-01-21 NOTE — Progress Notes (Signed)
[facility] ROUNDING NOTE   Subjective: No acute events overnight.  Tolerating p.o. intake this morning.  Bedside ultrasound completed immediately prior to my evaluation.  Does have some abdominal discomfort, but no frank pain, essentially near his baseline again.  Biopsy from large ascending colon lesion back and notable for Tubular Adenoma with at least High Grade Dysplasia.  Smaller tubular adenoma also resected from rectosigmoid colon.  Hemoglobin 7.9 this morning, from 8.6, 8.3.  Transfused 1 unit on 8/6 and 3 units on 8/5 for presenting hemoglobin of 5.6.  No overt GI blood loss.   Objective: Vital signs in last 24 hours: Temp:  [98 F (36.7 C)-98.9 F (37.2 C)] 98.9 F (37.2 C) (08/08 0630) Pulse Rate:  [58-61] 61 (08/08 0632) Resp:  [18-20] 20 (08/08 0630) BP: (97-120)/(47-75) 99/52 (08/08 0630) SpO2:  [94 %-100 %] 94 % (08/08 1601) Last BM Date: 01/20/19 General: NAD, alert, well conversive Lungs: CTA bilaterally, no wheezes, rales Heart: RRR, no murmurs Abdomen: Soft, NT, ND, +BS   Intake/Output from previous day: 08/07 0701 - 08/08 0700 In: 720 [P.O.:720] Out: -  Intake/Output this shift: No intake/output data recorded.   Lab Results: Recent Labs    01/20/19 0440 01/20/19 1332 01/21/19 0503  WBC 2.9* 2.8* 3.5*  HGB 8.3* 8.6* 7.9*  PLT 149* 158 126*  MCV 82.6 82.1 82.0   BMET Recent Labs    01/19/19 0950 01/20/19 0440 01/21/19 0503  NA 134* 136 132*  K 3.4* 3.8 4.0  CL 107 106 104  CO2 21* 25 21*  GLUCOSE 98 107* 152*  BUN 7 6 9   CREATININE 0.87 0.80 0.93  CALCIUM 7.7* 8.1* 8.2*   LFT Recent Labs    01/20/19 0440 01/21/19 0503  PROT 6.5 6.1*  ALBUMIN 2.5* 2.4*  AST 40 58*  ALT 14 18  ALKPHOS 94 106  BILITOT 1.4* 1.7*   PT/INR No results for input(s): INR in the last 72 hours.    Imaging/Other results: US Abdomen Complete  Result Date: 01/21/2019 CLINICAL DATA:  Abdominal pain. EXAM: ABDOMEN ULTRASOUND COMPLETE  COMPARISON:  CT of 01/18/2019. FINDINGS: Gallbladder: Contracted gallbladder. No stones or pericholecystic fluid. Mild wall thickening at 4 mm is likely due to underdistention. Common bile duct: Diameter: Normal, 2 mm. Liver: Irregular hepatic capsule with nodularity, consistent with moderate cirrhosis. Portal vein is patent on color Doppler imaging with normal direction of blood flow towards the liver. IVC: No abnormality visualized. Pancreas: Visualized portion unremarkable. Spleen: Size and appearance within normal limits. Right Kidney: Length: 10.6 cm. Echogenicity within normal limits. No mass or hydronephrosis visualized. Left Kidney: Length: 9.0 cm. Echogenicity within normal limits. No mass or hydronephrosis visualized. Abdominal aorta: No aneurysm visualized. Other findings: Moderate volume ascites. IMPRESSION: 1. Collapse gallbladder which could relate to recent meal or a component of gallbladder dysfunction. No findings of acute cholecystitis. 2. Cirrhosis and ascites. Electronically Signed   By: Abigail Miyamoto M.D.   On: 01/21/2019 10:20      Assessment and Plan:  1) Alcoholic cirrhosis 2) Portal hypertension 3) Ascites 4) Large ascending colon Adenomatous lesion 5) Anemia  - Resume diuretics - If repeat ultrasound demonstrates significant ascites, recommend repeat therapeutic paracentesis prior to discharge - Paracentesis on 8/5 with high SAAG and no e/o SBP - Esophageal varices x1 noted on EGD.  Already treated with beta-blocker - Unclear if the hemoglobin of 7.9 represents equilibration.  Otherwise without overt GI blood loss.  Iron indices demonstrate IDA -Recommend giving a dose  of Feraheme prior to discharge - We discussed the Tubular Adenoma with High Grade Dysplasia at length today.  Given that these are surface mucosal biopsies, cannot be sure that there is not frank malignancy in deeper segments.  Will ultimately defer to Dr. Benson Norway whether or not to refer for surgical resection  versus attempt at EMR.  The lesion is otherwise nonobstructing, so no plan for emergent surgery. - If discharging over the weekend, can follow-up with Dr. Benson Norway in 1 to 2 weeks as previously discussed -Low-sodium diet - Counseled on strict abstinence from all alcohol - If discharging over the weekend, recommend repeat BMP in 1 week    Lavena Bullion, DO  01/21/2019, 12:54 PM [facility] Pager [phone]

## 2019-01-21 NOTE — Procedures (Signed)
PROCEDURE SUMMARY:  Successful image-guided paracentesis from the left lateral abdomen.  Yielded 2.7 liters of clear gold fluid.  No immediate complications.  EBL = 0 mL. Patient tolerated well.   Specimen was not sent for labs.  Claris Pong Cortasia Screws PA-C 01/21/2019 1:59 PM

## 2019-01-22 ENCOUNTER — Encounter (HOSPITAL_COMMUNITY): Payer: Self-pay | Admitting: Gastroenterology

## 2019-01-22 DIAGNOSIS — C182 Malignant neoplasm of ascending colon: Secondary | ICD-10-CM

## 2019-01-22 LAB — CBC
HCT: 29.7 % — ABNORMAL LOW (ref 39.0–52.0)
Hemoglobin: 8.3 g/dL — ABNORMAL LOW (ref 13.0–17.0)
MCH: 23.2 pg — ABNORMAL LOW (ref 26.0–34.0)
MCHC: 27.9 g/dL — ABNORMAL LOW (ref 30.0–36.0)
MCV: 83.2 fL (ref 80.0–100.0)
Platelets: 130 10*3/uL — ABNORMAL LOW (ref 150–400)
RBC: 3.57 MIL/uL — ABNORMAL LOW (ref 4.22–5.81)
RDW: 22.3 % — ABNORMAL HIGH (ref 11.5–15.5)
WBC: 3 10*3/uL — ABNORMAL LOW (ref 4.0–10.5)
nRBC: 0 % (ref 0.0–0.2)

## 2019-01-22 LAB — MAGNESIUM: Magnesium: 2 mg/dL (ref 1.7–2.4)

## 2019-01-22 LAB — COMPREHENSIVE METABOLIC PANEL
ALT: 19 U/L (ref 0–44)
AST: 50 U/L — ABNORMAL HIGH (ref 15–41)
Albumin: 2.5 g/dL — ABNORMAL LOW (ref 3.5–5.0)
Alkaline Phosphatase: 101 U/L (ref 38–126)
Anion gap: 7 (ref 5–15)
BUN: 8 mg/dL (ref 6–20)
CO2: 22 mmol/L (ref 22–32)
Calcium: 8.3 mg/dL — ABNORMAL LOW (ref 8.9–10.3)
Chloride: 108 mmol/L (ref 98–111)
Creatinine, Ser: 0.86 mg/dL (ref 0.61–1.24)
GFR calc Af Amer: >60 mL/min (ref >60)
GFR calc non Af Amer: >60 mL/min (ref >60)
Glucose, Bld: 83 mg/dL (ref 70–99)
Potassium: 3.9 mmol/L (ref 3.5–5.1)
Sodium: 137 mmol/L (ref 135–145)
Total Bilirubin: 1.3 mg/dL — ABNORMAL HIGH (ref 0.3–1.2)
Total Protein: 6.3 g/dL — ABNORMAL LOW (ref 6.5–8.1)

## 2019-01-22 LAB — BODY FLUID CULTURE: Culture: NO GROWTH

## 2019-01-22 MED ORDER — SPIRONOLACTONE 100 MG PO TABS: 100.0000 mg | ORAL_TABLET | Freq: Every day | ORAL | 1 refills | Status: DC

## 2019-01-22 MED ORDER — FUROSEMIDE 40 MG PO TABS: 40.0000 mg | ORAL_TABLET | Freq: Every day | ORAL | 1 refills | Status: DC

## 2019-01-22 NOTE — Progress Notes (Signed)
Discharge instructions explained to patient. Prescriptions called in. IV discontinued. Discharged via wheelchair. Wife came to pick up patient.

## 2019-01-22 NOTE — Discharge Summary (Signed)
Physician Discharge Summary  Martin Anthony ZOX:[id number] DOB: June 02, 1966 DOA: 01/18/2019  PCP: [name], MD  Admit date: 01/18/2019 Discharge date: 01/22/2019  Admitted From: Home Disposition: Home  Recommendations for Outpatient Follow-up:  1. Follow up with PCP, colorectal surgery and gastroenterology in 1-2 weeks 2. Please obtain CBC/BMP/Mag at follow up 3. Please follow up on the following pending results: None  Home Health: None Equipment/Devices: None  Discharge Condition: Stable CODE STATUS: Full code  Hospital Course: 53 year old male with history of A. fib not on medication, HTN, depression, alcohol use disorder and GERD presenting with worsening abdominal pain distention for 2 weeks, and 40 pound weight loss in 1 year.   In ED, Hgb 6.5>5.6.  Creatinine 0.98.  Total bili 1.9.  INR 1.3.  FOBT negative.  EKG normal sinus rhythm.  CT abdomen and pelvis revealed enhancing ascending colon mass concerning for colonic neoplasm with large amount of ascites and peritoneal carcinomatosis and possible cirrhosis with mild esophageal varices.  Patient had paracentesis with removal of 7.5L peritoneal fluid on 01/18/2019.  Cytology and chemistry consistent with portal hypertension but not suggestive for SBP.  He had fluid reaccumulation and had another paracentesis on 01/21/19 with removal of 2.8 L.  He underwent colonoscopy and EGD by Dr. Benson Norway on 01/19/2019.  Ascending colon mass biopsied. EGD revealed large esophageal varices, portal hypertensive gastropathy and normal duodenum.   Ascending colon mass biopsy revealed tubular adenoma with at least high-grade dysplasia.  Per GI, this is not resectable endoscopically.  Consulted general surgery, Dr. Alphonsa Overall who recommended outpatient follow-up at their office in 1 to 2 weeks with colorectal surgery.  Patient was provided with office number and address.  GI to arrange outpatient follow-up.   See individual problem list below for more on  hospital course.  Discharge Diagnoses:  Abdominal pain/distention: Likely due to large ascites from cirrhosis and possible peritoneal carcinomatosis -Likely due to ascites.  Liver cirrhosis/ascites/large esophageal varices/portal hypertensive gastropathy: Likely due to alcohol.  -Paracentesis on 01/18/2019 with removal of 7.5 L.  Fluid study and culture not consistent with SBP. -Repeat paracentesis on 01/21/2019 with removal of 2.8 L. -EGD by Dr. Benson Norway on 01/19/2019 as above. -GI recommended repeat EGD in 3 years. -Acute hepatitis panel and HIV negative. -Discharge on oral Lasix, Aldactone and lactulose. -Low-sodium diet -GI to arrange outpatient follow-up. -Counseled on alcohol cessation.  Ascending colon mass: Patient reports unintentional weight loss of 40 pounds in the last 1 year. -Colonoscopy by Dr. Benson Norway on 01/19/2019 -Biopsy revealed tubular adenoma with at least high-grade dysplasia. -Patient to follow-up with colorectal surgery per general surgery recommendation. -Was provided with address and phone number to [facility] surgery associate  Pancytopenia/iron deficiency anemia: Likely due to alcohol and possibly due to liver cirrhosis.  Patient has no melena or hematochezia. FOBT negative. EGD and colonoscopy without obvious source.  Spleen size and appearance normal on ultrasound.  Anemia panel consistent with severe iron deficiency anemia. -Hgb 6.5> 5.6> 2u> 7.2>1u>8.3>8.3 -Transfused Feraheme on 01/20/2019. -Continue oral iron  Unintentional weight loss: Likely combination of heavy alcohol use and possible malignancy. -Multivitamins  Alcohol use disorder: Last drink 5 days prior to admission.  No withdrawal symptoms. -Encouraged cessation.  Elevated liver enzymes/hyperbilirubinemia: Likely due to liver failure/cirrhosis.  Basically resolved.  History of GERD: -PPI  Paroxysmal A. fib: Currently normal sinus rhythm.  Not on medications -Continue bisoprolol.    -Nadolol on discharge  Hypertension: Normotensive -Discharged on nadolol.  Hyperglycemia: A1c 5.3% which  could be falsely low after blood transfusion.  CBG within appropriate range.  History of depression: Stable.  Denies SI or HI. -Continue home meds   Discharge Instructions  Discharge Instructions    Call MD for:  extreme fatigue   Complete by: As directed    Call MD for:  persistant dizziness or light-headedness   Complete by: As directed    Call MD for:  persistant nausea and vomiting   Complete by: As directed    Call MD for:  severe uncontrolled pain   Complete by: As directed    Call MD for:  temperature >100.4   Complete by: As directed    Diet - low sodium heart healthy   Complete by: As directed    Discharge instructions   Complete by: As directed    It has been a pleasure taking care of you! You were hospitalized with abdominal distention/pain due to ascites (fluid in your belly) from liver failure.  We have drained the fluid.  With that, your symptoms improved.  We are discharging you on fluid pills to reduce your risk of recurrence of fluid buildup.  We strongly recommend you quit drinking.  We also recommend limiting your salt (sodium) intake to less than 2 g (2000 mg a day. We also gave you blood transfusion and iron infusion for anemia (low blood level).  We are discharging you on oral iron that you need to continue taking.  In regards to the colon mass, called [place] general surgery office to schedule outpatient follow-up in 1 to 2 weeks.  Ask for colorectal surgeons per Dr. Ashok Cordia recommendation.  Gastroenterologist will also arrange an outpatient follow-up in the next few weeks. Please review your new medication list and the directions before you take your medications. Please call your primary care office as soon as possible to schedule hospital follow-up visit in 1 to 2 weeks. The gastroenterologist office will get in touch with you to  discuss about the result of you colon biopsy.  Take care,   Increase activity slowly   Complete by: As directed    Increase activity slowly   Complete by: As directed      Allergies as of 01/22/2019   No Known Allergies     Medication List    STOP taking these medications   bisoprolol-hydrochlorothiazide 10-6.25 MG tablet Commonly known as: ZIAC     TAKE these medications   feeding supplement (ENSURE ENLIVE) Liqd Take 237 mLs by mouth 2 (two) times daily between meals.   ferrous sulfate 325 (65 FE) MG tablet Take 1 tablet (325 mg total) by mouth 2 (two) times daily with a meal.   furosemide 40 MG tablet Commonly known as: LASIX Take 1 tablet (40 mg total) by mouth daily.   lactulose 10 GM/15ML solution Commonly known as: CHRONULAC Take 30 mils (20 g total) 2-3 times a day for goal of 2-3 bowel movements a day.   nadolol 20 MG tablet Commonly known as: Corgard Take 1 tablet (20 mg total) by mouth daily.   omeprazole 20 MG tablet Commonly known as: PRILOSEC OTC Take 20 mg by mouth daily.   sertraline 50 MG tablet Commonly known as: ZOLOFT Take 1 tablet (50 mg total) by mouth daily.   spironolactone 100 MG tablet Commonly known as: ALDACTONE Take 1 tablet (100 mg total) by mouth daily.   thiamine 100 MG tablet Take 1 tablet (100 mg total) by mouth daily.      Follow-up  Information    [name], MD. Schedule an appointment as soon as possible for a visit in 1 week(s).   Specialty: Family Medicine Contact information: [place] [address] [address]. Schedule an appointment as soon as possible for a visit in 1 week(s).   Why: Ask for colorectal surgery appointment per Dr. Ashok Cordia recommendation Contact information: [address] [place] [address]          Consultations:  Gastroenterology  General surgery over the  phone  Procedures/Studies:  2D Echo: None  US Abdomen Complete  Result Date: 01/21/2019 CLINICAL DATA:  Abdominal pain. EXAM: ABDOMEN ULTRASOUND COMPLETE COMPARISON:  CT of 01/18/2019. FINDINGS: Gallbladder: Contracted gallbladder. No stones or pericholecystic fluid. Mild wall thickening at 4 mm is likely due to underdistention. Common bile duct: Diameter: Normal, 2 mm. Liver: Irregular hepatic capsule with nodularity, consistent with moderate cirrhosis. Portal vein is patent on color Doppler imaging with normal direction of blood flow towards the liver. IVC: No abnormality visualized. Pancreas: Visualized portion unremarkable. Spleen: Size and appearance within normal limits. Right Kidney: Length: 10.6 cm. Echogenicity within normal limits. No mass or hydronephrosis visualized. Left Kidney: Length: 9.0 cm. Echogenicity within normal limits. No mass or hydronephrosis visualized. Abdominal aorta: No aneurysm visualized. Other findings: Moderate volume ascites. IMPRESSION: 1. Collapse gallbladder which could relate to recent meal or a component of gallbladder dysfunction. No findings of acute cholecystitis. 2. Cirrhosis and ascites. Electronically Signed   By: Abigail Miyamoto M.D.   On: 01/21/2019 10:20   Ct Abdomen Pelvis W Contrast  Result Date: 01/18/2019 CLINICAL DATA:  Abdominal pain and distension EXAM: CT ABDOMEN AND PELVIS WITH CONTRAST TECHNIQUE: Multidetector CT imaging of the abdomen and pelvis was performed using the standard protocol following bolus administration of intravenous contrast. CONTRAST:  148mL OMNIPAQUE IOHEXOL 300 MG/ML  SOLN COMPARISON:  None. FINDINGS: Lower chest: No acute abnormality. Hepatobiliary: Liver is diffusely decreased in attenuation consistent with fatty infiltration. Mild heterogeneity is noted without definitive mass. The gallbladder is decompressed. Pancreas: Unremarkable. No pancreatic ductal dilatation or surrounding inflammatory changes. Spleen: Normal in size  without focal abnormality. Adrenals/Urinary Tract: The adrenal glands are within normal limits. The kidneys demonstrate a normal enhancement pattern. Tiny nonobstructing left renal stone is noted. Normal excretion of contrast material is noted bilaterally. Bladder is partially distended. Stomach/Bowel: The appendix is within normal limits. The colon is predominately decompressed. There is an enhancing lesion identified in the ascending colon just above the cecum which measures approximately 6 x 4.4 cm in greatest AP and transverse dimensions respectively. It extends for approximately 5.8 cm in craniocaudad projection. More distal colon appears within normal limits. No small bowel obstructive changes are seen. The stomach is within normal limits. Mild esophageal varices are seen. Vascular/Lymphatic: Aortic atherosclerosis. No enlarged abdominal or pelvic lymph nodes. Reproductive: Prostate is unremarkable. Other: Significant ascites is noted within the abdomen. Mild increased density is noted within the omentum which may be related to the underlying ascites although the possibility of peritoneal disease could not be totally excluded given colonic mass. Musculoskeletal: No acute or significant osseous findings. IMPRESSION: Large enhancing mass lesion within the ascending colon consistent with colonic neoplasm till proven otherwise. Significant ascites throughout the abdomen with some increased density within the omentum suggestive of peritoneal metastatic disease. This may simply be related to the underlying ascites. Heterogeneity of the liver without definitive mass.  Some mild esophageal varices are seen and these changes are likely related to underlying cirrhosis. Tiny nonobstructing left renal stone. Electronically Signed   By: Inez Catalina M.D.   On: 01/18/2019 02:22   US Paracentesis  Result Date: 01/21/2019 INDICATION: Patient with history cirrhosis, alcohol abuse, portal hypertension, esophageal varices,  ascending colon mass, possible peritoneal carcinomatosis, and recurrent ascites. Request is made for therapeutic paracentesis with a max of 5 L. EXAM: ULTRASOUND GUIDED THERAPEUTIC PARACENTESIS MEDICATIONS: 10 mL 1% lidocaine COMPLICATIONS: None immediate. PROCEDURE: Informed written consent was obtained from the patient after a discussion of the risks, benefits and alternatives to treatment. A timeout was performed prior to the initiation of the procedure. Initial ultrasound scanning demonstrates a large amount of ascites within the left lower abdominal quadrant. The left lower abdomen was prepped and draped in the usual sterile fashion. 1% lidocaine was used for local anesthesia. Following this, a 19 gauge, 7-cm, Yueh catheter was introduced. An ultrasound image was saved for documentation purposes. The paracentesis was performed. The catheter was removed and a dressing was applied. The patient tolerated the procedure well without immediate post procedural complication. FINDINGS: A total of approximately 2.7 L of clear gold fluid was removed. IMPRESSION: Successful ultrasound-guided paracentesis yielding 2.7 L of peritoneal fluid. Read by: Earley Abide, PA-C Electronically Signed   By: Aletta Edouard M.D.   On: 01/21/2019 14:21   US Paracentesis  Result Date: 01/18/2019 INDICATION: Patient with history of colon mass, probable cirrhosis by imaging, alcohol abuse, ascites. Request made for diagnostic and therapeutic paracentesis. EXAM: ULTRASOUND GUIDED DIAGNOSTIC AND THERAPEUTIC PARACENTESIS MEDICATIONS: None COMPLICATIONS: None immediate. PROCEDURE: Informed written consent was obtained from the patient after a discussion of the risks, benefits and alternatives to treatment. A timeout was performed prior to the initiation of the procedure. Initial ultrasound scanning demonstrates a large amount of ascites within the right lower abdominal quadrant. The right lower abdomen was prepped and draped in the usual  sterile fashion. 1% lidocaine was used for local anesthesia. Following this, a 19 gauge, 7-cm, Yueh catheter was introduced. An ultrasound image was saved for documentation purposes. The paracentesis was performed. The catheter was removed and a dressing was applied. The patient tolerated the procedure well without immediate post procedural complication. FINDINGS: A total of approximately 7.4 liters of yellow fluid was removed. Samples were sent to the laboratory as requested by the clinical team. IMPRESSION: Successful ultrasound-guided diagnostic and therapeutic paracentesis yielding 7.4 liters of peritoneal fluid. Read by: Rowe Robert, PA-C Electronically Signed   By: Lucrezia Europe M.D.   On: 01/18/2019 15:31   Dg Chest Portable 1 View  Result Date: 01/18/2019 CLINICAL DATA:  Palpitations, shortness of breath EXAM: PORTABLE CHEST 1 VIEW COMPARISON:  08/28/2010 FINDINGS: Heart and mediastinal contours are within normal limits. No focal opacities or effusions. No acute bony abnormality. IMPRESSION: No active disease. Electronically Signed   By: Rolm Baptise M.D.   On: 01/18/2019 01:56     Subjective: No major events overnight of this morning.  No complaint this morning.  He denies chest pain, dyspnea, dizziness, nausea, vomiting or abdominal pain.  Had a couple of loose bowel movements yesterday.  Denies hematochezia or melena.   Discharge Exam: Vitals:   01/21/19 2304 01/22/19 0703  BP: 102/64 99/62  Pulse: (!) 53 (!) 51  Resp: 19 17  Temp: 98.7 F (37.1 C) 98.6 F (37 C)  SpO2: 98% 97%    GENERAL: No acute distress.  Appears well.  HEENT:  MMM.  Vision and hearing grossly intact.  NECK: Supple.  No apparent JVD. LUNGS:  No IWOB. Good air movement bilaterally. HEART:  RRR. Heart sounds normal.  ABD: Bowel sounds present. Soft. Non tender.  No obvious ascites. MSK/EXT:  Moves all extremities. No apparent deformity. No edema bilaterally. SKIN: no apparent skin lesion or wound NEURO:  Awake, alert and oriented appropriately.  No gross deficit.  PSYCH: Calm. Normal affect.   The results of significant diagnostics from this hospitalization (including imaging, microbiology, ancillary and laboratory) are listed below for reference.     Microbiology: Recent Results (from the past 240 hour(s))  SARS CORONAVIRUS 2 Nasal Swab Aptima Multi Swab     Status: None   Collection Time: 01/18/19  1:21 AM   Specimen: Aptima Multi Swab; Nasal Swab  Result Value Ref Range Status   SARS Coronavirus 2 NEGATIVE NEGATIVE Final    Comment: (NOTE) SARS-CoV-2 target nucleic acids are NOT DETECTED. The SARS-CoV-2 RNA is generally detectable in upper and lower respiratory specimens during the acute phase of infection. Negative results do not preclude SARS-CoV-2 infection, do not rule out co-infections with other pathogens, and should not be used as the sole basis for treatment or other patient management decisions. Negative results must be combined with clinical observations, patient history, and epidemiological information. The expected result is Negative. Fact Sheet for Patients: SugarRoll.be Fact Sheet for Healthcare Providers: https://www.woods-mathews.com/ This test is not yet approved or cleared by the [place] FDA and  has been authorized for detection and/or diagnosis of SARS-CoV-2 by FDA under an Emergency Use Authorization (EUA). This EUA will remain  in effect (meaning this test can be used) for the duration of the COVID-19 declaration under Section 56 4(b)(1) of the Act, 21 U.S.C. section 360bbb-3(b)(1), unless the authorization is terminated or revoked sooner. Performed at [facility], [place] [address] [place], [place] [address]   Body fluid culture     Status: None   Collection Time: 01/18/19  3:45 PM   Specimen: Peritoneal Fluid  Result Value Ref Range Status   Specimen Description   Final    PERITONEAL Performed at  [facility] [address] [place], [place] [address]    Special Requests   Final    NONE Performed at [facility], [place] [address], [place], [place] [address]    Gram Stain   Final    FEW WBC PRESENT, PREDOMINANTLY MONONUCLEAR NO ORGANISMS SEEN    Culture   Final    NO GROWTH 3 DAYS Performed at [facility] [address], [place],  [address]    Report Status 01/22/2019 FINAL  Final     Labs: BNP (last 3 results) No results for input(s): BNP in the last 8760 hours. Basic Metabolic Panel: Recent Labs  Lab 01/18/19 0030 01/18/19 0103 01/19/19 0950 01/20/19 0440 01/21/19 0503 01/22/19 0535  NA 135  --  134* 136 132* 137  K 3.6  --  3.4* 3.8 4.0 3.9  CL 105  --  107 106 104 108  CO2 20*  --  21* 25 21* 22  GLUCOSE 126*  --  98 107* 152* 83  BUN 10  --  7 6 9 8   CREATININE 0.98  --  0.87 0.80 0.93 0.86  CALCIUM 8.5*  --  7.7* 8.1* 8.2* 8.3*  MG  --  2.1  --  2.1 1.9 2.0   Liver Function Tests: Recent Labs  Lab  01/18/19 0030 01/20/19 0440 01/21/19 0503 01/22/19 0535  AST 41 40 58* 50*  ALT 14 14 18 19   ALKPHOS 118 94 106 101  BILITOT 1.9* 1.4* 1.7* 1.3*  PROT 7.8 6.5 6.1* 6.3*  ALBUMIN 3.0* 2.5* 2.4* 2.5*   Recent Labs  Lab 01/18/19 0030  LIPASE 23   No results for input(s): AMMONIA in the last 168 hours. CBC: Recent Labs  Lab 01/18/19 0525  01/19/19 0950 01/19/19 2023 01/20/19 0440 01/20/19 1332 01/21/19 0503 01/22/19 0535  WBC 3.4*  --  3.2*  --  2.9* 2.8* 3.5* 3.0*  NEUTROABS 2.0  --   --   --   --   --   --   --   HGB 5.6*   < > 7.1* 9.0* 8.3* 8.6* 7.9* 8.3*  HCT 21.0*   < > 25.6* 32.3* 30.0* 29.8* 28.2* 29.7*  MCV 78.1*  --  82.1  --  82.6 82.1 82.0 83.2  PLT 162  --  157  --  149* 158 126* 130*   < > = values in this interval not displayed.   Cardiac Enzymes: No results for input(s): CKTOTAL, CKMB, CKMBINDEX, TROPONINI in the last 168 hours. BNP: Invalid input(s): POCBNP CBG: No  results for input(s): GLUCAP in the last 168 hours. D-Dimer No results for input(s): DDIMER in the last 72 hours. Hgb A1c No results for input(s): HGBA1C in the last 72 hours. Lipid Profile No results for input(s): CHOL, HDL, LDLCALC, TRIG, CHOLHDL, LDLDIRECT in the last 72 hours. Thyroid function studies No results for input(s): TSH, T4TOTAL, T3FREE, THYROIDAB in the last 72 hours.  Invalid input(s): FREET3 Anemia work up No results for input(s): VITAMINB12, FOLATE, FERRITIN, TIBC, IRON, RETICCTPCT in the last 72 hours. Urinalysis    Component Value Date/Time   COLORURINE YELLOW 01/18/2019 0435   APPEARANCEUR CLEAR 01/18/2019 0435   LABSPEC >1.046 (H) 01/18/2019 0435   PHURINE 5.0 01/18/2019 0435   GLUCOSEU NEGATIVE 01/18/2019 0435   HGBUR NEGATIVE 01/18/2019 0435   BILIRUBINUR NEGATIVE 01/18/2019 0435   BILIRUBINUR 2+ 05/17/2015 1013   KETONESUR 5 (A) 01/18/2019 0435   PROTEINUR NEGATIVE 01/18/2019 0435   UROBILINOGEN 0.2 05/17/2015 1013   NITRITE NEGATIVE 01/18/2019 0435   LEUKOCYTESUR NEGATIVE 01/18/2019 0435   Sepsis Labs Invalid input(s): PROCALCITONIN,  WBC,  LACTICIDVEN   Time coordinating discharge: 40 minutes  SIGNED:  Mercy Riding, MD  [facility] 01/22/2019, 1:13 PM  If 7PM-7AM, please contact night-coverage [email or web address] Password [email or web address]

## 2019-03-20 ENCOUNTER — Other Ambulatory Visit: Payer: Self-pay | Admitting: Family Medicine

## 2019-03-21 NOTE — Telephone Encounter (Signed)
Forwarding to PCP for approval  

## 2020-03-25 ENCOUNTER — Other Ambulatory Visit: Payer: Self-pay | Admitting: Family Medicine

## 2020-04-24 ENCOUNTER — Other Ambulatory Visit: Payer: Self-pay | Admitting: Family Medicine

## 2020-06-04 ENCOUNTER — Other Ambulatory Visit: Payer: Self-pay | Admitting: Family Medicine

## 2020-06-27 ENCOUNTER — Other Ambulatory Visit: Payer: Self-pay

## 2020-06-28 ENCOUNTER — Ambulatory Visit: Payer: Self-pay | Admitting: Family Medicine

## 2020-07-03 ENCOUNTER — Ambulatory Visit: Payer: Self-pay | Admitting: Family Medicine

## 2020-07-03 ENCOUNTER — Other Ambulatory Visit: Payer: Self-pay

## 2020-07-03 ENCOUNTER — Encounter: Payer: Self-pay | Admitting: Family Medicine

## 2020-07-03 VITALS — BP 134/82 | HR 95 | Temp 98.6°F | Ht 75.0 in | Wt 191.8 lb

## 2020-07-03 DIAGNOSIS — K7031 Alcoholic cirrhosis of liver with ascites: Secondary | ICD-10-CM

## 2020-07-03 DIAGNOSIS — K6389 Other specified diseases of intestine: Secondary | ICD-10-CM

## 2020-07-03 DIAGNOSIS — F1010 Alcohol abuse, uncomplicated: Secondary | ICD-10-CM

## 2020-07-03 DIAGNOSIS — I10 Essential (primary) hypertension: Secondary | ICD-10-CM

## 2020-07-03 LAB — CBC WITH DIFFERENTIAL/PLATELET
Basophils Absolute: 0.1 10*3/uL (ref 0.0–0.1)
Basophils Relative: 1.5 % (ref 0.0–3.0)
Eosinophils Absolute: 0.1 10*3/uL (ref 0.0–0.7)
Eosinophils Relative: 4.1 % (ref 0.0–5.0)
HCT: 35.4 % — ABNORMAL LOW (ref 39.0–52.0)
Hemoglobin: 11.8 g/dL — ABNORMAL LOW (ref 13.0–17.0)
Lymphocytes Relative: 23.1 % (ref 12.0–46.0)
Lymphs Abs: 0.8 10*3/uL (ref 0.7–4.0)
MCHC: 33.4 g/dL (ref 30.0–36.0)
MCV: 95.9 fl (ref 78.0–100.0)
Monocytes Absolute: 0.3 10*3/uL (ref 0.1–1.0)
Monocytes Relative: 9.9 % (ref 3.0–12.0)
Neutro Abs: 2.2 10*3/uL (ref 1.4–7.7)
Neutrophils Relative %: 61.4 % (ref 43.0–77.0)
Platelets: 162 10*3/uL (ref 150.0–400.0)
RBC: 3.69 Mil/uL — ABNORMAL LOW (ref 4.22–5.81)
RDW: 14.7 % (ref 11.5–15.5)
WBC: 3.5 10*3/uL — ABNORMAL LOW (ref 4.0–10.5)

## 2020-07-03 LAB — BASIC METABOLIC PANEL
BUN: 5 mg/dL — ABNORMAL LOW (ref 6–23)
CO2: 22 mEq/L (ref 19–32)
Calcium: 8.3 mg/dL — ABNORMAL LOW (ref 8.4–10.5)
Chloride: 105 mEq/L (ref 96–112)
Creatinine, Ser: 0.66 mg/dL (ref 0.40–1.50)
GFR: 107.3 mL/min (ref >60.00)
Glucose, Bld: 127 mg/dL — ABNORMAL HIGH (ref 70–99)
Potassium: 3.9 mEq/L (ref 3.5–5.1)
Sodium: 136 mEq/L (ref 135–145)

## 2020-07-03 LAB — HEPATIC FUNCTION PANEL
ALT: 15 U/L (ref 0–53)
AST: 43 U/L — ABNORMAL HIGH (ref 0–37)
Albumin: 3 g/dL — ABNORMAL LOW (ref 3.5–5.2)
Alkaline Phosphatase: 181 U/L — ABNORMAL HIGH (ref 39–117)
Bilirubin, Direct: 1 mg/dL — ABNORMAL HIGH (ref 0.0–0.3)
Total Bilirubin: 2.3 mg/dL — ABNORMAL HIGH (ref 0.2–1.2)
Total Protein: 7 g/dL (ref 6.0–8.3)

## 2020-07-03 LAB — TSH: TSH: 2.79 u[IU]/mL (ref 0.35–4.50)

## 2020-07-03 MED ORDER — SPIRONOLACTONE 100 MG PO TABS: 100.0000 mg | ORAL_TABLET | Freq: Every day | ORAL | 2 refills | Status: DC

## 2020-07-03 NOTE — Progress Notes (Signed)
   Subjective:    Patient ID: Martin Anthony, male    DOB: 09/29/1965, 55 y.o.   MRN: [id number]  HPI Here after an absence of almost 3 years complaining of pain and swelling in the abdomen. In August of 2020 he was admitted for alcoholic cirrhosis and ascites, and he had about 7.5 liters of fluid drained off the abdomen. He also had a large mass on the ascending colon, and colonoscopic biopsy showed a tubular adenoma. There was still a good bit of concern that this may harbor a cancer however. Fluid cytology was benign. Immediately after he got out of the hospital he moved to [place], [place] to live at his [employer]. He never returned phone calls or tried to follow up with Dr. Carol Ada (GI) after that, so in Dec 2020 Dr. Benson Norway discharged him from his practice. Tashan has not seen anyone after that until today. He says he stopped drinking alcohol completely for 6 months and then resumed drinking at a much lower rate. He says he now averages 5-6 alcoholic drinks a week. He had been on Lasix and Spironolactone, but he ran out of Spironolactone a few months ago. He says that in the past 6 weeks he has gained 20 lbs, and his abdomen has been tight and protruding. He has had some nausea but he has not vomited. He has numerous watery stools every day. No fevers. He says he will be living in [place] for about 6 months, and then he plans to return to [place].    Review of Systems  Constitutional: Negative.   Respiratory: Negative.   Cardiovascular: Negative.   Gastrointestinal: Positive for abdominal distention, abdominal pain, diarrhea and nausea. Negative for anal bleeding, blood in stool, constipation and vomiting.  Genitourinary: Negative.        Objective:   Physical Exam Constitutional:      Appearance: He is not ill-appearing.  Cardiovascular:     Rate and Rhythm: Normal rate and regular rhythm.     Pulses: Normal pulses.     Heart sounds: Normal heart sounds.   Pulmonary:     Effort: Pulmonary effort is normal.     Breath sounds: Normal breath sounds.  Abdominal:     General: Bowel sounds are normal. There is distension.     Palpations: There is no mass.     Tenderness: There is no guarding or rebound.     Hernia: No hernia is present.     Comments: He is mildly tender in all quadrants, there is a positive fluid wave   Musculoskeletal:     Comments: 1+ ankle edema on each side   Neurological:     Mental Status: He is alert.           Assessment & Plan:  Alcoholic cirrhosis with ascites, now worse off Spironolactone. He also has a mass on the ascending colon that needs to be reassessed.  We will refill the Spironolactone 100 mg daily and get labs, including a BMET. I urged him to refrain from drinking any alcohol at all. Refer urgently to GI.  Alysia Penna, MD

## 2020-07-04 ENCOUNTER — Telehealth: Payer: Self-pay | Admitting: Family Medicine

## 2020-07-04 ENCOUNTER — Telehealth: Payer: Self-pay

## 2020-07-04 NOTE — Telephone Encounter (Signed)
Patient aware of results.

## 2020-07-04 NOTE — Telephone Encounter (Signed)
-----   Message from [name], MD sent at 07/04/2020 12:53 PM EST ----- Normal except his liver enzymes are mildly elevated and he is mildly anemic. He should take an OTC iron pill (325 mg) daily

## 2020-07-04 NOTE — Telephone Encounter (Signed)
Patient is calling back for lab results, please advise. CB is [phone]

## 2020-07-04 NOTE — Telephone Encounter (Signed)
Left message for patient to call back regarding results and recommendations. °

## 2020-07-16 ENCOUNTER — Ambulatory Visit: Payer: Self-pay | Admitting: Nurse Practitioner

## 2020-08-27 ENCOUNTER — Other Ambulatory Visit: Payer: Self-pay | Admitting: Family Medicine

## 2020-08-29 ENCOUNTER — Telehealth: Payer: Self-pay

## 2020-08-29 NOTE — Telephone Encounter (Signed)
Pt is taking Lasix 20 mg, Last refill for the Rx was prescribed on 01/18/2019 at the ED, pt last office visit was 07/03/2020. Please advise if ok to send this prescription to the pharmacy

## 2020-08-30 ENCOUNTER — Other Ambulatory Visit: Payer: Self-pay

## 2020-08-30 MED ORDER — FUROSEMIDE 40 MG PO TABS: 40.0000 mg | ORAL_TABLET | Freq: Every day | ORAL | 3 refills | Status: DC

## 2020-08-30 NOTE — Telephone Encounter (Signed)
Rx sent to pt pharmacy 

## 2020-08-30 NOTE — Telephone Encounter (Signed)
He should be taking Lasix 40 mg daily. Call in #[phone] with 3 rf

## 2021-04-01 ENCOUNTER — Telehealth: Payer: Self-pay | Admitting: Family Medicine

## 2021-04-01 NOTE — Telephone Encounter (Signed)
Patient is requesting something to be sent in for his blood pressure because it is spiking. He is also calling to see if he could have xanax or something to help him keep calm.  Patient is requesting a phone call back at [phone].  Please advise.

## 2021-04-02 NOTE — Telephone Encounter (Signed)
Pt states his BP today is 135/85. He c/o of anxiety & would like Xanax, however it has not be prescribed since 2017. Pt notified that he will schedule OV to discuss issues. Pt states he will be out of town & request appt for "next couple weeks" Appt made for 11/2.  Pt mychart password reset per pt request so that he may access his mychart account. Pt advised to change password once access gained. Pt verb understanding.

## 2021-04-16 ENCOUNTER — Ambulatory Visit: Payer: Self-pay | Admitting: Family Medicine

## 2021-04-19 ENCOUNTER — Other Ambulatory Visit: Payer: Self-pay | Admitting: Family Medicine

## 2021-05-05 ENCOUNTER — Ambulatory Visit: Payer: Self-pay | Admitting: Family Medicine

## 2021-05-11 ENCOUNTER — Emergency Department (HOSPITAL_COMMUNITY): Payer: Self-pay

## 2021-05-11 ENCOUNTER — Encounter (HOSPITAL_COMMUNITY): Payer: Self-pay | Admitting: Emergency Medicine

## 2021-05-11 ENCOUNTER — Inpatient Hospital Stay (HOSPITAL_COMMUNITY)
Admission: EM | Admit: 2021-05-11 | Discharge: 2021-05-21 | DRG: 896 | Disposition: A | Discharge status: Home Health | Payer: Self-pay | Attending: Internal Medicine | Admitting: Internal Medicine

## 2021-05-11 DIAGNOSIS — Z833 Family history of diabetes mellitus: Secondary | ICD-10-CM

## 2021-05-11 DIAGNOSIS — G928 Other toxic encephalopathy: Secondary | ICD-10-CM | POA: Diagnosis present

## 2021-05-11 DIAGNOSIS — Z681 Body mass index (BMI) 19 or less, adult: Secondary | ICD-10-CM

## 2021-05-11 DIAGNOSIS — K3189 Other diseases of stomach and duodenum: Secondary | ICD-10-CM | POA: Diagnosis present

## 2021-05-11 DIAGNOSIS — E785 Hyperlipidemia, unspecified: Secondary | ICD-10-CM | POA: Diagnosis present

## 2021-05-11 DIAGNOSIS — L89510 Pressure ulcer of right ankle, unstageable: Secondary | ICD-10-CM | POA: Diagnosis present

## 2021-05-11 DIAGNOSIS — R64 Cachexia: Secondary | ICD-10-CM | POA: Diagnosis present

## 2021-05-11 DIAGNOSIS — W1830XA Fall on same level, unspecified, initial encounter: Secondary | ICD-10-CM | POA: Diagnosis present

## 2021-05-11 DIAGNOSIS — E1165 Type 2 diabetes mellitus with hyperglycemia: Secondary | ICD-10-CM | POA: Diagnosis present

## 2021-05-11 DIAGNOSIS — K7682 Hepatic encephalopathy: Secondary | ICD-10-CM | POA: Diagnosis present

## 2021-05-11 DIAGNOSIS — K766 Portal hypertension: Secondary | ICD-10-CM | POA: Diagnosis present

## 2021-05-11 DIAGNOSIS — F32A Depression, unspecified: Secondary | ICD-10-CM | POA: Diagnosis present

## 2021-05-11 DIAGNOSIS — Z20822 Contact with and (suspected) exposure to covid-19: Secondary | ICD-10-CM | POA: Diagnosis present

## 2021-05-11 DIAGNOSIS — I8510 Secondary esophageal varices without bleeding: Secondary | ICD-10-CM | POA: Diagnosis present

## 2021-05-11 DIAGNOSIS — N170 Acute kidney failure with tubular necrosis: Secondary | ICD-10-CM | POA: Diagnosis present

## 2021-05-11 DIAGNOSIS — K219 Gastro-esophageal reflux disease without esophagitis: Secondary | ICD-10-CM | POA: Diagnosis present

## 2021-05-11 DIAGNOSIS — E872 Acidosis, unspecified: Secondary | ICD-10-CM | POA: Diagnosis present

## 2021-05-11 DIAGNOSIS — E870 Hyperosmolality and hypernatremia: Secondary | ICD-10-CM | POA: Diagnosis present

## 2021-05-11 DIAGNOSIS — L89520 Pressure ulcer of left ankle, unstageable: Secondary | ICD-10-CM | POA: Diagnosis present

## 2021-05-11 DIAGNOSIS — G9340 Encephalopathy, unspecified: Secondary | ICD-10-CM | POA: Diagnosis present

## 2021-05-11 DIAGNOSIS — E860 Dehydration: Secondary | ICD-10-CM | POA: Diagnosis present

## 2021-05-11 DIAGNOSIS — R609 Edema, unspecified: Secondary | ICD-10-CM

## 2021-05-11 DIAGNOSIS — M109 Gout, unspecified: Secondary | ICD-10-CM | POA: Diagnosis present

## 2021-05-11 DIAGNOSIS — F1010 Alcohol abuse, uncomplicated: Secondary | ICD-10-CM | POA: Diagnosis present

## 2021-05-11 DIAGNOSIS — Z87891 Personal history of nicotine dependence: Secondary | ICD-10-CM

## 2021-05-11 DIAGNOSIS — F419 Anxiety disorder, unspecified: Secondary | ICD-10-CM | POA: Diagnosis present

## 2021-05-11 DIAGNOSIS — F10239 Alcohol dependence with withdrawal, unspecified: Principal | ICD-10-CM | POA: Diagnosis present

## 2021-05-11 DIAGNOSIS — Z79899 Other long term (current) drug therapy: Secondary | ICD-10-CM

## 2021-05-11 DIAGNOSIS — E119 Type 2 diabetes mellitus without complications: Secondary | ICD-10-CM

## 2021-05-11 DIAGNOSIS — R188 Other ascites: Secondary | ICD-10-CM

## 2021-05-11 DIAGNOSIS — I480 Paroxysmal atrial fibrillation: Secondary | ICD-10-CM | POA: Diagnosis present

## 2021-05-11 DIAGNOSIS — K7011 Alcoholic hepatitis with ascites: Secondary | ICD-10-CM | POA: Diagnosis present

## 2021-05-11 DIAGNOSIS — K7031 Alcoholic cirrhosis of liver with ascites: Secondary | ICD-10-CM | POA: Diagnosis present

## 2021-05-11 DIAGNOSIS — W19XXXA Unspecified fall, initial encounter: Secondary | ICD-10-CM

## 2021-05-11 DIAGNOSIS — L89150 Pressure ulcer of sacral region, unstageable: Secondary | ICD-10-CM | POA: Diagnosis present

## 2021-05-11 DIAGNOSIS — I10 Essential (primary) hypertension: Secondary | ICD-10-CM | POA: Diagnosis present

## 2021-05-11 DIAGNOSIS — R109 Unspecified abdominal pain: Secondary | ICD-10-CM

## 2021-05-11 DIAGNOSIS — R7989 Other specified abnormal findings of blood chemistry: Secondary | ICD-10-CM | POA: Diagnosis present

## 2021-05-11 DIAGNOSIS — D62 Acute posthemorrhagic anemia: Secondary | ICD-10-CM | POA: Diagnosis present

## 2021-05-11 DIAGNOSIS — Z8249 Family history of ischemic heart disease and other diseases of the circulatory system: Secondary | ICD-10-CM

## 2021-05-11 DIAGNOSIS — K767 Hepatorenal syndrome: Secondary | ICD-10-CM | POA: Diagnosis present

## 2021-05-11 DIAGNOSIS — R0602 Shortness of breath: Secondary | ICD-10-CM

## 2021-05-11 DIAGNOSIS — Y92009 Unspecified place in unspecified non-institutional (private) residence as the place of occurrence of the external cause: Secondary | ICD-10-CM

## 2021-05-11 DIAGNOSIS — N179 Acute kidney failure, unspecified: Secondary | ICD-10-CM | POA: Diagnosis present

## 2021-05-11 DIAGNOSIS — M6282 Rhabdomyolysis: Secondary | ICD-10-CM | POA: Diagnosis present

## 2021-05-11 NOTE — ED Triage Notes (Signed)
Pt BIB [facility] and was naked his living room floor. He is anxious and hyper-fixated on his hands per EMS. Hx of liver failure and alcoholism.

## 2021-05-12 ENCOUNTER — Inpatient Hospital Stay (HOSPITAL_COMMUNITY): Payer: Self-pay

## 2021-05-12 ENCOUNTER — Encounter (HOSPITAL_COMMUNITY): Payer: Self-pay | Admitting: Internal Medicine

## 2021-05-12 DIAGNOSIS — I10 Essential (primary) hypertension: Secondary | ICD-10-CM

## 2021-05-12 DIAGNOSIS — E119 Type 2 diabetes mellitus without complications: Secondary | ICD-10-CM

## 2021-05-12 DIAGNOSIS — G9340 Encephalopathy, unspecified: Secondary | ICD-10-CM

## 2021-05-12 DIAGNOSIS — M6282 Rhabdomyolysis: Secondary | ICD-10-CM

## 2021-05-12 DIAGNOSIS — F1010 Alcohol abuse, uncomplicated: Secondary | ICD-10-CM

## 2021-05-12 DIAGNOSIS — R7989 Other specified abnormal findings of blood chemistry: Secondary | ICD-10-CM

## 2021-05-12 DIAGNOSIS — N179 Acute kidney failure, unspecified: Secondary | ICD-10-CM | POA: Diagnosis present

## 2021-05-12 LAB — BASIC METABOLIC PANEL
Anion gap: 20 — ABNORMAL HIGH (ref 5–15)
BUN: 181 mg/dL — ABNORMAL HIGH (ref 6–20)
CO2: 13 mmol/L — ABNORMAL LOW (ref 22–32)
Calcium: 9.1 mg/dL (ref 8.9–10.3)
Chloride: 113 mmol/L — ABNORMAL HIGH (ref 98–111)
Creatinine, Ser: 4.84 mg/dL — ABNORMAL HIGH (ref 0.61–1.24)
GFR, Estimated: 13 mL/min — ABNORMAL LOW (ref >60)
Glucose, Bld: 174 mg/dL — ABNORMAL HIGH (ref 70–99)
Potassium: 4.6 mmol/L (ref 3.5–5.1)
Sodium: 146 mmol/L — ABNORMAL HIGH (ref 135–145)

## 2021-05-12 LAB — COMPREHENSIVE METABOLIC PANEL
ALT: 231 U/L — ABNORMAL HIGH (ref 0–44)
AST: 479 U/L — ABNORMAL HIGH (ref 15–41)
Albumin: 3.5 g/dL (ref 3.5–5.0)
Alkaline Phosphatase: 100 U/L (ref 38–126)
Anion gap: 19 — ABNORMAL HIGH (ref 5–15)
BUN: 174 mg/dL — ABNORMAL HIGH (ref 6–20)
CO2: 13 mmol/L — ABNORMAL LOW (ref 22–32)
Calcium: 9 mg/dL (ref 8.9–10.3)
Chloride: 112 mmol/L — ABNORMAL HIGH (ref 98–111)
Creatinine, Ser: 4.56 mg/dL — ABNORMAL HIGH (ref 0.61–1.24)
GFR, Estimated: 14 mL/min — ABNORMAL LOW (ref >60)
Glucose, Bld: 198 mg/dL — ABNORMAL HIGH (ref 70–99)
Potassium: 5.1 mmol/L (ref 3.5–5.1)
Sodium: 144 mmol/L (ref 135–145)
Total Bilirubin: 5.3 mg/dL — ABNORMAL HIGH (ref 0.3–1.2)
Total Protein: 7.2 g/dL (ref 6.5–8.1)

## 2021-05-12 LAB — HEPATIC FUNCTION PANEL
ALT: 217 U/L — ABNORMAL HIGH (ref 0–44)
AST: 402 U/L — ABNORMAL HIGH (ref 15–41)
Albumin: 3.4 g/dL — ABNORMAL LOW (ref 3.5–5.0)
Alkaline Phosphatase: 95 U/L (ref 38–126)
Bilirubin, Direct: 2.5 mg/dL — ABNORMAL HIGH (ref 0.0–0.2)
Indirect Bilirubin: 2 mg/dL — ABNORMAL HIGH (ref 0.3–0.9)
Total Bilirubin: 4.5 mg/dL — ABNORMAL HIGH (ref 0.3–1.2)
Total Protein: 6.9 g/dL (ref 6.5–8.1)

## 2021-05-12 LAB — TYPE AND SCREEN
ABO/RH(D): O POS
Antibody Screen: NEGATIVE

## 2021-05-12 LAB — LACTIC ACID, PLASMA: Lactic Acid, Venous: 1.9 mmol/L (ref 0.5–1.9)

## 2021-05-12 LAB — HIV ANTIBODY (ROUTINE TESTING W REFLEX): HIV Screen 4th Generation wRfx: NONREACTIVE

## 2021-05-12 LAB — IRON AND TIBC
Iron: 18 ug/dL — ABNORMAL LOW (ref 45–182)
Saturation Ratios: 6 % — ABNORMAL LOW (ref 17.9–39.5)
TIBC: 318 ug/dL (ref 250–450)
UIBC: 300 ug/dL

## 2021-05-12 LAB — CBC WITH DIFFERENTIAL/PLATELET
Abs Immature Granulocytes: 0.06 10*3/uL (ref 0.00–0.07)
Abs Immature Granulocytes: 0.06 10*3/uL (ref 0.00–0.07)
Basophils Absolute: 0 10*3/uL (ref 0.0–0.1)
Basophils Absolute: 0 10*3/uL (ref 0.0–0.1)
Basophils Relative: 0 %
Basophils Relative: 0 %
Eosinophils Absolute: 0 10*3/uL (ref 0.0–0.5)
Eosinophils Absolute: 0 10*3/uL (ref 0.0–0.5)
Eosinophils Relative: 0 %
Eosinophils Relative: 0 %
HCT: 29.6 % — ABNORMAL LOW (ref 39.0–52.0)
HCT: 28.1 % — ABNORMAL LOW (ref 39.0–52.0)
Hemoglobin: 9.8 g/dL — ABNORMAL LOW (ref 13.0–17.0)
Hemoglobin: 9.4 g/dL — ABNORMAL LOW (ref 13.0–17.0)
Immature Granulocytes: 1 %
Immature Granulocytes: 1 %
Lymphocytes Relative: 9 %
Lymphocytes Relative: 9 %
Lymphs Abs: 1.2 10*3/uL (ref 0.7–4.0)
Lymphs Abs: 1.1 10*3/uL (ref 0.7–4.0)
MCH: 31.9 pg (ref 26.0–34.0)
MCH: 32.1 pg (ref 26.0–34.0)
MCHC: 33.1 g/dL (ref 30.0–36.0)
MCHC: 33.5 g/dL (ref 30.0–36.0)
MCV: 96.4 fL (ref 80.0–100.0)
MCV: 95.9 fL (ref 80.0–100.0)
Monocytes Absolute: 1.1 10*3/uL — ABNORMAL HIGH (ref 0.1–1.0)
Monocytes Absolute: 1.2 10*3/uL — ABNORMAL HIGH (ref 0.1–1.0)
Monocytes Relative: 8 %
Monocytes Relative: 10 %
Neutro Abs: 10.8 10*3/uL — ABNORMAL HIGH (ref 1.7–7.7)
Neutro Abs: 9.5 10*3/uL — ABNORMAL HIGH (ref 1.7–7.7)
Neutrophils Relative %: 82 %
Neutrophils Relative %: 80 %
Platelets: 309 10*3/uL (ref 150–400)
Platelets: 257 10*3/uL (ref 150–400)
RBC: 3.07 MIL/uL — ABNORMAL LOW (ref 4.22–5.81)
RBC: 2.93 MIL/uL — ABNORMAL LOW (ref 4.22–5.81)
RDW: 14.2 % (ref 11.5–15.5)
RDW: 14.1 % (ref 11.5–15.5)
WBC: 13.1 10*3/uL — ABNORMAL HIGH (ref 4.0–10.5)
WBC: 11.9 10*3/uL — ABNORMAL HIGH (ref 4.0–10.5)
nRBC: 0 % (ref 0.0–0.2)
nRBC: 0 % (ref 0.0–0.2)

## 2021-05-12 LAB — HEPATITIS PANEL, ACUTE
HCV Ab: NONREACTIVE
Hep A IgM: NONREACTIVE
Hep B C IgM: NONREACTIVE
Hepatitis B Surface Ag: NONREACTIVE

## 2021-05-12 LAB — RESP PANEL BY RT-PCR (FLU A&B, COVID) ARPGX2
Influenza A by PCR: NEGATIVE
Influenza B by PCR: NEGATIVE
SARS Coronavirus 2 by RT PCR: NEGATIVE

## 2021-05-12 LAB — BLOOD GAS, ARTERIAL
Acid-base deficit: 9.6 mmol/L — ABNORMAL HIGH (ref 0.0–2.0)
Bicarbonate: 12.9 mmol/L — ABNORMAL LOW (ref 20.0–28.0)
FIO2: 21
O2 Saturation: 97.9 %
Patient temperature: 97.5
pCO2 arterial: 20.5 mmHg — ABNORMAL LOW (ref 32.0–48.0)
pH, Arterial: 7.417 (ref 7.350–7.450)
pO2, Arterial: 127 mmHg — ABNORMAL HIGH (ref 83.0–108.0)

## 2021-05-12 LAB — LIPASE, BLOOD: Lipase: 54 U/L — ABNORMAL HIGH (ref 11–51)

## 2021-05-12 LAB — CREATININE, URINE, RANDOM: Creatinine, Urine: 162.16 mg/dL

## 2021-05-12 LAB — RETICULOCYTES
Immature Retic Fract: 23.6 % — ABNORMAL HIGH (ref 2.3–15.9)
RBC.: 2.93 MIL/uL — ABNORMAL LOW (ref 4.22–5.81)
Retic Count, Absolute: 121.3 10*3/uL (ref 19.0–186.0)
Retic Ct Pct: 4.1 % — ABNORMAL HIGH (ref 0.4–3.1)

## 2021-05-12 LAB — AMMONIA
Ammonia: 72 umol/L — ABNORMAL HIGH (ref 9–35)
Ammonia: 133 umol/L — ABNORMAL HIGH (ref 9–35)

## 2021-05-12 LAB — TROPONIN I (HIGH SENSITIVITY): Troponin I (High Sensitivity): 92 ng/L — ABNORMAL HIGH (ref <18)

## 2021-05-12 LAB — FOLATE: Folate: 24.8 ng/mL (ref >5.9)

## 2021-05-12 LAB — FERRITIN: Ferritin: 175 ng/mL (ref 24–336)

## 2021-05-12 LAB — CK: Total CK: 8313 U/L — ABNORMAL HIGH (ref 49–397)

## 2021-05-12 LAB — VITAMIN B12: Vitamin B-12: 4672 pg/mL — ABNORMAL HIGH (ref 180–914)

## 2021-05-12 LAB — ACETAMINOPHEN LEVEL: Acetaminophen (Tylenol), Serum: <10 ug/mL — ABNORMAL LOW (ref 10–30)

## 2021-05-12 LAB — ETHANOL: Alcohol, Ethyl (B): <10 mg/dL (ref <10)

## 2021-05-12 LAB — SALICYLATE LEVEL: Salicylate Lvl: <7 mg/dL — ABNORMAL LOW (ref 7.0–30.0)

## 2021-05-12 LAB — PROTIME-INR
INR: 1.6 — ABNORMAL HIGH (ref 0.8–1.2)
Prothrombin Time: 19.3 seconds — ABNORMAL HIGH (ref 11.4–15.2)

## 2021-05-12 LAB — SODIUM, URINE, RANDOM: Sodium, Ur: <10 mmol/L

## 2021-05-12 MED ORDER — LACTATED RINGERS IV BOLUS
2000.0000 mL | Freq: Once | INTRAVENOUS | Status: AC
  Administered 2021-05-12: 04:00:00 2000 mL via INTRAVENOUS

## 2021-05-12 MED ORDER — SODIUM CHLORIDE 0.45 % IV SOLN
INTRAVENOUS | Status: AC
  Filled 2021-05-12 (×3): qty 75

## 2021-05-12 MED ORDER — LORAZEPAM 2 MG/ML IJ SOLN
1.0000 mg | INTRAMUSCULAR | Status: AC | PRN
  Administered 2021-05-12 – 2021-05-13 (×5): 2 mg via INTRAVENOUS
  Administered 2021-05-13: 1 mg via INTRAVENOUS
  Administered 2021-05-14 – 2021-05-15 (×5): 2 mg via INTRAVENOUS
  Filled 2021-05-12 (×10): qty 1

## 2021-05-12 MED ORDER — FOLIC ACID 1 MG PO TABS: 1.0000 mg | ORAL_TABLET | Freq: Every day | ORAL | Status: DC

## 2021-05-12 MED ORDER — LORAZEPAM 1 MG PO TABS
1.0000 mg | ORAL_TABLET | ORAL | Status: AC | PRN
  Administered 2021-05-12 (×2): 2 mg via ORAL
  Filled 2021-05-12 (×2): qty 2

## 2021-05-12 MED ORDER — THIAMINE HCL 100 MG/ML IJ SOLN
100.0000 mg | Freq: Every day | INTRAMUSCULAR | Status: DC
  Administered 2021-05-13 – 2021-05-16 (×4): 100 mg via INTRAVENOUS
  Filled 2021-05-12 (×5): qty 2

## 2021-05-12 MED ORDER — LORAZEPAM 2 MG/ML IJ SOLN: 1.0000 mg | INTRAMUSCULAR | Status: DC | PRN

## 2021-05-12 MED ORDER — THIAMINE HCL 100 MG/ML IJ SOLN
500.0000 mg | Freq: Three times a day (TID) | INTRAVENOUS | Status: DC
  Administered 2021-05-12: 08:00:00 500 mg via INTRAVENOUS
  Filled 2021-05-12 (×2): qty 5

## 2021-05-12 MED ORDER — HEPARIN SODIUM (PORCINE) 5000 UNIT/ML IJ SOLN
5000.0000 [IU] | Freq: Three times a day (TID) | INTRAMUSCULAR | Status: DC
  Administered 2021-05-12: 06:00:00 5000 [IU] via SUBCUTANEOUS
  Filled 2021-05-12: qty 1

## 2021-05-12 MED ORDER — SODIUM CHLORIDE 0.9 % IV SOLN
2.0000 g | INTRAVENOUS | Status: DC
  Administered 2021-05-12 – 2021-05-20 (×9): 2 g via INTRAVENOUS
  Filled 2021-05-12 (×8): qty 20

## 2021-05-12 MED ORDER — DEXTROSE 5 % IV SOLN: INTRAVENOUS | Status: DC

## 2021-05-12 MED ORDER — LORAZEPAM 2 MG/ML IJ SOLN: 0.0000 mg | Freq: Four times a day (QID) | INTRAMUSCULAR | Status: DC

## 2021-05-12 MED ORDER — LACTULOSE 10 GM/15ML PO SOLN
20.0000 g | Freq: Once | ORAL | Status: AC
  Administered 2021-05-12: 02:00:00 20 g via ORAL
  Filled 2021-05-12: qty 30

## 2021-05-12 MED ORDER — THIAMINE HCL 100 MG PO TABS
100.0000 mg | ORAL_TABLET | Freq: Every day | ORAL | Status: DC
  Administered 2021-05-12 – 2021-05-21 (×6): 100 mg via ORAL
  Filled 2021-05-12 (×6): qty 1

## 2021-05-12 MED ORDER — ADULT MULTIVITAMIN W/MINERALS CH
1.0000 | ORAL_TABLET | Freq: Every day | ORAL | Status: DC
  Administered 2021-05-12 – 2021-05-21 (×7): 1 via ORAL
  Filled 2021-05-12 (×8): qty 1

## 2021-05-12 MED ORDER — SERTRALINE HCL 50 MG PO TABS: 50.0000 mg | ORAL_TABLET | Freq: Every day | ORAL | Status: DC

## 2021-05-12 MED ORDER — SODIUM CHLORIDE 0.9 % IV BOLUS
2000.0000 mL | Freq: Once | INTRAVENOUS | Status: AC
  Administered 2021-05-12: 13:00:00 2000 mL via INTRAVENOUS

## 2021-05-12 MED ORDER — FOLIC ACID 1 MG PO TABS
1.0000 mg | ORAL_TABLET | Freq: Every day | ORAL | Status: DC
  Administered 2021-05-12: 10:00:00 1 mg via ORAL
  Filled 2021-05-12: qty 1

## 2021-05-12 MED ORDER — ADULT MULTIVITAMIN W/MINERALS CH: 1.0000 | ORAL_TABLET | Freq: Every day | ORAL | Status: DC

## 2021-05-12 MED ORDER — LORAZEPAM 1 MG PO TABS: 1.0000 mg | ORAL_TABLET | ORAL | Status: DC | PRN

## 2021-05-12 MED ORDER — LORAZEPAM 2 MG/ML IJ SOLN: 0.0000 mg | Freq: Two times a day (BID) | INTRAMUSCULAR | Status: DC

## 2021-05-12 MED ORDER — LACTULOSE 10 GM/15ML PO SOLN
20.0000 g | Freq: Three times a day (TID) | ORAL | Status: DC
  Administered 2021-05-12 – 2021-05-13 (×2): 20 g via ORAL
  Filled 2021-05-12 (×2): qty 30

## 2021-05-12 NOTE — ED Notes (Signed)
Pt very tremorous, figidty , incoherent speech when speaking. Unable to follow commands. Mitts applied.

## 2021-05-12 NOTE — Consult Note (Signed)
Renal Service Consult Note [facility]  Martin Anthony 05/12/2021 Sol Blazing, MD Requesting Physician: Dr [place]  Reason for Consult: Renal failure HPI: The patient is a 55 y.o. year-old w/ hx of depression, ETOH abuse, gout, GERD, HL, HTN, cirrhosis w/ portal HTN came to ED due to confusion. His sister found him.  Pt gave hx of being on the floor "a couple of days" to ED staff. In ED pt was confused but repsnosive. Labs showed creat 4.5 (normal in Jan 2022) w/ ^AST/ALT, Tbili and NH3. Hb 9.8, WBC 13  . CT head neg. CXR neg. Pt rec'd 2L LR bolus and bicarb gtt at 125 cc/hr.  Asked to see for renal failure.    Pt seen in ED.  He is in mittens and confused and lethargic.  Denies any hx kidney issues , no voiding issues recently.    ROS - pt confused, no ROS obtained   Past Medical History  Past Medical History:  Diagnosis Date   Anxiety    Depression    Diabetes mellitus without complication ([place])    GERD (gastroesophageal reflux disease)    Gout    Hyperlipidemia    Hypertension    Substance abuse ([place])    alcoholism    Past Surgical History  Past Surgical History:  Procedure Laterality Date   BIOPSY  01/19/2019   Procedure: BIOPSY;  Surgeon: Carol Ada, MD;  Location: [facility];  Service: Endoscopy;;   COLONOSCOPY N/A 01/19/2019   Procedure: COLONOSCOPY;  Surgeon: Carol Ada, MD;  Location: [facility];  Service: Endoscopy;  Laterality: N/A;   ESOPHAGOGASTRODUODENOSCOPY (EGD) WITH PROPOFOL N/A 01/19/2019   Procedure: ESOPHAGOGASTRODUODENOSCOPY (EGD) WITH PROPOFOL;  Surgeon: Carol Ada, MD;  Location: [facility];  Service: Endoscopy;  Laterality: N/A;   HEMOSTASIS CLIP PLACEMENT  01/19/2019   Procedure: HEMOSTASIS CLIP PLACEMENT;  Surgeon: Carol Ada, MD;  Location: [facility];  Service: Endoscopy;;   HERNIA REPAIR     umbilical    INGUINAL HERNIA REPAIR Bilateral    POLYPECTOMY  01/19/2019   Procedure: POLYPECTOMY;  Surgeon: Carol Ada,  MD;  Location: [facility];  Service: Endoscopy;;   Family History  Family History  Problem Relation Age of Onset   Hypertension Mother    Diabetes Father    Heart attack Father    Pneumonia Father    Hypertension Father    Diabetes Maternal Grandfather    Social History  reports that he quit smoking about 19 years ago. He has never used smokeless tobacco. He reports current alcohol use of about 2.0 standard drinks per week. He reports that he does not use drugs. Allergies No Known Allergies Home medications Prior to Admission medications   Medication Sig Start Date End Date Taking? Authorizing Provider  furosemide (LASIX) 40 MG tablet Take 1 tablet (40 mg total) by mouth daily. 08/30/20  Yes [name], MD  nadolol (CORGARD) 20 MG tablet Take 1 tablet (20 mg total) by mouth daily. 01/20/19  Yes Mercy Riding, MD  sertraline (ZOLOFT) 50 MG tablet TAKE 1 TABLET BY MOUTH EVERY DAY Patient taking differently: Take 50 mg by mouth daily. 06/05/20  Yes [name], MD  spironolactone (ALDACTONE) 100 MG tablet TAKE 1 TABLET BY MOUTH EVERY DAY Patient taking differently: Take 100 mg by mouth daily. 04/21/21  Yes [name], MD     Vitals:   05/12/21 0840 05/12/21 1128 05/12/21 1130 05/12/21 1141  BP: 116/76  (!) 116/96 (!) 116/96  Pulse: 89 86 84 84  Resp: _0 Temp:      TempSrc:      SpO2: 99% 99% 100%    Exam Gen lethargic, confused, tremulous, in mittens No rash, cyanosis or gangrene Sclera anicteric, throat dry mouth  No jvd or bruits Chest clear bilat to bases, no rales/ wheezing RRR no MRG Abd soft ntnd no mass or ascites +bs GU normal male w/ foley in place, good UOP MS no joint effusions or deformity Ext no LE or UE edema, no wounds or ulcers Neuro nonfocal, as above     Home meds include - lasix 40 qd, nadolol 20 qd, zoloft 50, aldactone 100 qd      BUN 174/ Cr 4.5, K 5.1  CO2 13  AG 19  AST 479  ALT 231  Alb 3.5  NH3 72  Tbili 4.5  eGFR 14   CPK  8313   WBC 13k  Hb 9.8       CT stone study noncon - cirrhosis, ^spleen, GG changes RML/ RLL, normal appearing kidneys bilat no hydro     CXR - clear     UNa <10,  UCr 163       BP's 100- 140/ 75- 100    Assessment/ Plan: AKI - in pt w/ hx ETOH abuse and cirrhosis w/ decompensation at some level, but no ascites. CT w/o renal obstruction. UA pending. Looks dry on exam. BP's are low to normal. CPK is 8K. Suspect some of this is just vol depletion. Can't r/o HRS at this point, but lack of ascites argues against. Possible ATN from rhabdo but not that severe.  Will get UA, rebolus 2 L NS and cont bicarb gtt at 125 cc/hr.   Hypernatremia - Na 145 range, will start D5W at 125 cc/hr to help correct this.  Vol depletion - sp 2 L bolus already, ordered another 2 L bolus. Cont bicarb at 125 /hr.   ETOH abuse Cirrhosis/ splenomegaly Confusion - if renal function not improving or worsening, may need RRT      Kelly Splinter  MD 05/12/2021, 1:04 PM  Recent Labs  Lab 05/12/21 0053 05/12/21 0522  WBC 13.1* 11.9*  HGB 9.8* 9.4*   Recent Labs  Lab 05/12/21 0053 05/12/21 0522  K 5.1 4.6  BUN 174* 181*  CREATININE 4.56* 4.84*  CALCIUM 9.0 9.1

## 2021-05-12 NOTE — ED Notes (Signed)
Pt cleaned from BM, new diaper applied, turned with pillow to right side

## 2021-05-12 NOTE — ED Provider Notes (Signed)
[place] DEPT Provider Note   CSN: [id number] Arrival date & time: 05/11/21  2323     History Chief Complaint  Patient presents with   Altered Mental Status    Martin Anthony is a 55 y.o. male.  No reliable historian present.  EMS states that they were called out by a family or friend secondary to finding the patient on the floor rolling around naked and not acting like himself.  I reviewed the records he has a history of a cecal mass and also has a history of what looks like cirrhosis.  He has a significant history of alcoholism.  Patient states that reason he was on the floor naked was he was looking under the bed for the wrapper that used to be there.  He does not know when he last urinated.  He does not know if he has been sick recently.   Altered Mental Status     Past Medical History:  Diagnosis Date   Anxiety    Depression    Diabetes mellitus without complication ([place])    GERD (gastroesophageal reflux disease)    Gout    Hyperlipidemia    Hypertension    Substance abuse ([place])    alcoholism     Patient Active Problem List   Diagnosis Date Noted   ARF (acute renal failure) ([place]) 05/12/2021   Rhabdomyolysis 05/12/2021   Elevated LFTs 05/12/2021   Acute encephalopathy 05/12/2021   Ascites    Anemia 01/18/2019   Colonic mass 01/18/2019   Abdominal pain 01/18/2019   Diabetes mellitus without complication ([place]) 18/29/9371   Alcohol abuse 06/25/2015   Gout 10/03/2013   HYPERLIPIDEMIA 05/20/2007   Depression with anxiety 05/20/2007   Essential hypertension 05/20/2007   GERD 05/20/2007    Past Surgical History:  Procedure Laterality Date   BIOPSY  01/19/2019   Procedure: BIOPSY;  Surgeon: Carol Ada, MD;  Location: [facility];  Service: Endoscopy;;   COLONOSCOPY N/A 01/19/2019   Procedure: COLONOSCOPY;  Surgeon: Carol Ada, MD;  Location: [facility];  Service: Endoscopy;  Laterality: N/A;   ESOPHAGOGASTRODUODENOSCOPY  (EGD) WITH PROPOFOL N/A 01/19/2019   Procedure: ESOPHAGOGASTRODUODENOSCOPY (EGD) WITH PROPOFOL;  Surgeon: Carol Ada, MD;  Location: [facility];  Service: Endoscopy;  Laterality: N/A;   HEMOSTASIS CLIP PLACEMENT  01/19/2019   Procedure: HEMOSTASIS CLIP PLACEMENT;  Surgeon: Carol Ada, MD;  Location: [facility];  Service: Endoscopy;;   HERNIA REPAIR     umbilical    INGUINAL HERNIA REPAIR Bilateral    POLYPECTOMY  01/19/2019   Procedure: POLYPECTOMY;  Surgeon: Carol Ada, MD;  Location: [facility];  Service: Endoscopy;;       Family History  Problem Relation Age of Onset   Hypertension Mother    Diabetes Father    Heart attack Father    Pneumonia Father    Hypertension Father    Diabetes Maternal Grandfather     Social History   Tobacco Use   Smoking status: Former    Types: Cigarettes    Quit date: 08/11/2001    Years since quitting: 19.7   Smokeless tobacco: Never  Substance Use Topics   Alcohol use: Yes    Alcohol/week: 2.0 standard drinks    Types: 2 Standard drinks or equivalent per week   Drug use: No    Home Medications Prior to Admission medications   Medication Sig Start Date End Date Taking? Authorizing Provider  furosemide (LASIX) 40 MG tablet Take 1 tablet (40 mg total)  by mouth daily. 08/30/20  Yes [name], MD  nadolol (CORGARD) 20 MG tablet Take 1 tablet (20 mg total) by mouth daily. 01/20/19  Yes Mercy Riding, MD  sertraline (ZOLOFT) 50 MG tablet TAKE 1 TABLET BY MOUTH EVERY DAY Patient taking differently: Take 50 mg by mouth daily. 06/05/20  Yes [name], MD  spironolactone (ALDACTONE) 100 MG tablet TAKE 1 TABLET BY MOUTH EVERY DAY Patient taking differently: Take 100 mg by mouth daily. 04/21/21  Yes [name], MD    Allergies    Patient has no known allergies.  Review of Systems   Review of Systems  Unable to perform ROS: Mental status change   Physical Exam Updated Vital Signs BP 134/81   Pulse 83   Temp 98.3 F (36.8  C) (Oral)   Resp (!) 23   SpO2 100%   Physical Exam Vitals and nursing note reviewed.  Constitutional:      Appearance: He is ill-appearing.     Comments: cachectic  HENT:     Mouth/Throat:     Mouth: Mucous membranes are dry.  Eyes:     Pupils: Pupils are equal, round, and reactive to light.  Cardiovascular:     Rate and Rhythm: Tachycardia present.  Pulmonary:     Effort: Pulmonary effort is normal.  Abdominal:     General: Abdomen is flat.     Tenderness: There is no abdominal tenderness.  Musculoskeletal:        General: No swelling or tenderness. Normal range of motion.  Skin:    General: Skin is warm and dry.     Coloration: Skin is pale.  Neurological:     Mental Status: He is alert.     Comments: Moving all extremities, able to grip my fingers.  Extraocular movements intact.  He has a resting tremor and is oriented to person and time but not oriented to place or situation.    ED Results / Procedures / Treatments   Labs (all labs ordered are listed, but only abnormal results are displayed) Labs Reviewed  CBC WITH DIFFERENTIAL/PLATELET - Abnormal; Notable for the following components:      Result Value   WBC 13.1 (*)    RBC 3.07 (*)    Hemoglobin 9.8 (*)    HCT 29.6 (*)    Neutro Abs 10.8 (*)    Monocytes Absolute 1.1 (*)    All other components within normal limits  COMPREHENSIVE METABOLIC PANEL - Abnormal; Notable for the following components:   Chloride 112 (*)    CO2 13 (*)    Glucose, Bld 198 (*)    BUN 174 (*)    Creatinine, Ser 4.56 (*)    AST 479 (*)    ALT 231 (*)    Total Bilirubin 5.3 (*)    GFR, Estimated 14 (*)    Anion gap 19 (*)    All other components within normal limits  LIPASE, BLOOD - Abnormal; Notable for the following components:   Lipase 54 (*)    All other components within normal limits  AMMONIA - Abnormal; Notable for the following components:   Ammonia 72 (*)    All other components within normal limits  CK - Abnormal;  Notable for the following components:   Total CK 8,313 (*)    All other components within normal limits  BASIC METABOLIC PANEL - Abnormal; Notable for the following components:   Sodium 146 (*)    Chloride 113 (*)  CO2 13 (*)    Glucose, Bld 174 (*)    Creatinine, Ser 4.84 (*)    GFR, Estimated 13 (*)    Anion gap 20 (*)    All other components within normal limits  HEPATIC FUNCTION PANEL - Abnormal; Notable for the following components:   Albumin 3.4 (*)    AST 402 (*)    ALT 217 (*)    Total Bilirubin 4.5 (*)    Bilirubin, Direct 2.5 (*)    Indirect Bilirubin 2.0 (*)    All other components within normal limits  CBC WITH DIFFERENTIAL/PLATELET - Abnormal; Notable for the following components:   WBC 11.9 (*)    RBC 2.93 (*)    Hemoglobin 9.4 (*)    HCT 28.1 (*)    Neutro Abs 9.5 (*)    Monocytes Absolute 1.2 (*)    All other components within normal limits  PROTIME-INR - Abnormal; Notable for the following components:   Prothrombin Time 19.3 (*)    INR 1.6 (*)    All other components within normal limits  ACETAMINOPHEN LEVEL - Abnormal; Notable for the following components:   Acetaminophen (Tylenol), Serum <10 (*)    All other components within normal limits  SALICYLATE LEVEL - Abnormal; Notable for the following components:   Salicylate Lvl <6.1 (*)    All other components within normal limits  AMMONIA - Abnormal; Notable for the following components:   Ammonia 133 (*)    All other components within normal limits  RETICULOCYTES - Abnormal; Notable for the following components:   Retic Ct Pct 4.1 (*)    RBC. 2.93 (*)    Immature Retic Fract 23.6 (*)    All other components within normal limits  BLOOD GAS, ARTERIAL - Abnormal; Notable for the following components:   pCO2 arterial 20.5 (*)    pO2, Arterial 127 (*)    Bicarbonate 12.9 (*)    Acid-base deficit 9.6 (*)    All other components within normal limits  TROPONIN I (HIGH SENSITIVITY) - Abnormal; Notable for  the following components:   Troponin I (High Sensitivity) 92 (*)    All other components within normal limits  RESP PANEL BY RT-PCR (FLU A&B, COVID) ARPGX2  ETHANOL  LACTIC ACID, PLASMA  URINALYSIS, ROUTINE W REFLEX MICROSCOPIC  RAPID URINE DRUG SCREEN, HOSP PERFORMED  HIV ANTIBODY (ROUTINE TESTING W REFLEX)  CBC  LACTIC ACID, PLASMA  VITAMIN B12  FOLATE  IRON AND TIBC  FERRITIN  VITAMIN B1  HEPATITIS PANEL, ACUTE  HEMOGLOBIN A1C  SODIUM, URINE, RANDOM  CREATININE, URINE, RANDOM  TYPE AND SCREEN    EKG None  Radiology DG Chest 2 View  Result Date: 05/12/2021 CLINICAL DATA:  Anxious. History of hypertension and diabetes mellitus. EXAM: CHEST - 2 VIEW COMPARISON:  01/18/2019. FINDINGS: The heart size and mediastinal contours are within normal limits. Both lungs are clear. No acute osseous abnormality. IMPRESSION: No active cardiopulmonary disease. Electronically Signed   By: Brett Fairy M.D.   On: 05/12/2021 00:15   DG Pelvis 1-2 Views  Result Date: 05/12/2021 CLINICAL DATA:  Fall today EXAM: PELVIS - 1-2 VIEW COMPARISON:  None. FINDINGS: There is no evidence of pelvic fracture or diastasis. No pelvic bone lesions are seen. IMPRESSION: Negative. Electronically Signed   By: Jorje Guild M.D.   On: 05/12/2021 07:06   CT Head Wo Contrast  Result Date: 05/12/2021 CLINICAL DATA:  Initial evaluation for acute altered mental status. EXAM: CT HEAD WITHOUT CONTRAST TECHNIQUE:  Contiguous axial images were obtained from the base of the skull through the vertex without intravenous contrast. COMPARISON:  None available. FINDINGS: Brain: Diffuse prominence of the CSF containing spaces compatible with generalized cerebral atrophy, advanced for age. Patchy hypodensity involving the supratentorial cerebral white matter most consistent with chronic small vessel ischemic disease. No evidence for acute intracranial hemorrhage. No findings to suggest acute large vessel territory infarct. No  mass lesion, midline shift, or mass effect. Ventricles are normal in size without evidence for hydrocephalus. No extra-axial fluid collection identified. Vascular: No hyperdense vessel identified.Calcified atherosclerosis present at the skull base. Skull: Scalp soft tissues demonstrate no acute abnormality. Calvarium intact. Sinuses/Orbits: Globes and orbital soft tissues within normal limits. Visualized paranasal sinuses are clear. No mastoid effusion. IMPRESSION: 1. No acute intracranial abnormality. 2. Generalized cerebral atrophy with chronic microvascular ischemic disease, advanced for age. Electronically Signed   By: Jeannine Boga M.D.   On: 05/12/2021 00:17   DG Foot 2 Views Left  Result Date: 05/12/2021 CLINICAL DATA:  Swelling and sores. EXAM: LEFT FOOT - 2 VIEW COMPARISON:  None. FINDINGS: Study limited by overlying artifact from clothing. Within this limitation, there is no evidence of fracture or dislocation. No gross bony destruction evident to suggest osteomyelitis. IMPRESSION: Negative. Electronically Signed   By: Misty Stanley M.D.   On: 05/12/2021 06:50   DG Foot 2 Views Right  Result Date: 05/12/2021 CLINICAL DATA:  Swelling and sores. EXAM: RIGHT FOOT - 2 VIEW COMPARISON:  None. FINDINGS: Study limited by superimposition of the distal toes on both images. Overlying artifact from clothing evident. Within these limitations, no fracture or dislocation. No gross bony destruction to suggest osteomyelitis. IMPRESSION: Negative. Electronically Signed   By: Misty Stanley M.D.   On: 05/12/2021 06:49   CT RENAL STONE STUDY  Result Date: 05/12/2021 CLINICAL DATA:  Flank pain. EXAM: CT ABDOMEN AND PELVIS WITHOUT CONTRAST TECHNIQUE: Multidetector CT imaging of the abdomen and pelvis was performed following the standard protocol without IV contrast. COMPARISON:  01/18/2019 FINDINGS: Lower chest: Small peripheral areas of ground-glass opacity are seen in the anterior right middle lobe  (18/4) and anterior left upper lobe (image 5/series 4). Atelectasis noted posterior left base. No evidence for pleural effusion. Hepatobiliary: Nodular liver contour is compatible with cirrhosis. Gallbladder lumen shows high attenuation, likely sludge although stones not excluded. No intrahepatic or extrahepatic biliary dilation. Pancreas: No focal mass lesion. No dilatation of the main duct. No intraparenchymal cyst. No peripancreatic edema. Spleen: Splenic volume measures 532 cc with craniocaudal length of 13.6 cm, findings consistent with splenomegaly. Adrenals/Urinary Tract: No adrenal nodule or mass. Kidneys unremarkable. No evidence for hydroureter. Foley catheter decompresses the urinary bladder. Gas in the bladder lumen is compatible with the instrumentation. Stomach/Bowel: Stomach is unremarkable. No gastric wall thickening. No evidence of outlet obstruction. Duodenum is normally positioned as is the ligament of Treitz. No small bowel wall thickening. No small bowel dilatation. The terminal ileum is normal. The appendix is normal. No gross colonic mass. No colonic wall thickening. Vascular/Lymphatic: There is moderate atherosclerotic calcification of the abdominal aorta without aneurysm. There is no gastrohepatic or hepatoduodenal ligament lymphadenopathy. No retroperitoneal or mesenteric lymphadenopathy. No pelvic sidewall lymphadenopathy. Reproductive: Unremarkable. Other: No intraperitoneal free fluid. Musculoskeletal: No worrisome lytic or sclerotic osseous abnormality. IMPRESSION: 1. No acute findings in the abdomen or pelvis. Specifically, no findings to explain the patient's history of flank pain. 2. Cirrhotic changes in the liver with splenomegaly. 3. Small peripheral areas of ground-glass opacity  in the right middle lobe and left upper lobe. Imaging features are nonspecific but may be infectious/inflammatory. Follow-up chest CT in 3 months could be used to ensure resolution. 4. Aortic  Atherosclerosis (ICD10-I70.0). Electronically Signed   By: Misty Stanley M.D.   On: 05/12/2021 06:57    Procedures .Critical Care Performed by: Merrily Pew, MD Authorized by: Merrily Pew, MD   Critical care provider statement:    Critical care time (minutes):  32   Critical care time was exclusive of:  Separately billable procedures and treating other patients and teaching time   Critical care was necessary to treat or prevent imminent or life-threatening deterioration of the following conditions:  CNS failure or compromise, metabolic crisis and hepatic failure   Critical care was time spent personally by me on the following activities:  Development of treatment plan with patient or surrogate, evaluation of patient's response to treatment, examination of patient, obtaining history from patient or surrogate, review of old charts, re-evaluation of patient's condition, pulse oximetry, ordering and performing treatments and interventions and ordering and review of laboratory studies   Medications Ordered in ED Medications  sodium bicarbonate 75 mEq in sodium chloride 0.45 % 1,075 mL infusion ( Intravenous New Bag/Given 05/12/21 0654)  lactulose (CHRONULAC) 10 GM/15ML solution 20 g (has no administration in time range)  folic acid (FOLVITE) tablet 1 mg (has no administration in time range)  thiamine 500mg  in normal saline (62ml) IVPB (has no administration in time range)  multivitamin with minerals tablet 1 tablet (has no administration in time range)  cefTRIAXone (ROCEPHIN) 2 g in sodium chloride 0.9 % 100 mL IVPB (has no administration in time range)  lactulose (CHRONULAC) 10 GM/15ML solution 20 g (20 g Oral Given 05/12/21 0220)  lactated ringers bolus 2,000 mL (0 mLs Intravenous Stopped 05/12/21 0932)    ED Course  I have reviewed the triage vital signs and the nursing notes.  Pertinent labs & imaging results that were available during my care of the patient were reviewed by me and  considered in my medical decision making (see chart for details).    MDM Rules/Calculators/A&P                         Ultimately patient appears to have rhabdomyolysis and possible hepatic encephalopathy.  Not sure if he has hepatic and cephalopathy causing his demise in his being found on the ground and being altered or if his alcohol withdrawal as he does have some tremors as well.  He is significantly altered.  Lactulose given.  Fluids started but has not made urine and he knows how long and has not made any urine here in the emergency room.  I discussed with Dr. Hal Hope about admission for possible nephrology consult and further work-up.  Final Clinical Impression(s) / ED Diagnoses Final diagnoses:  Encephalopathy  Non-traumatic rhabdomyolysis    Rx / DC Orders ED Discharge Orders     None        Hazle Ogburn, Corene Cornea, MD 05/12/21 [id number]

## 2021-05-12 NOTE — H&P (Addendum)
History and Physical    Martin Anthony DSK:[id number] DOB: August 13, 1965 DOA: 05/11/2021  PCP: [name], MD  Patient coming from: Home.  History obtained from the ER physician, previous records and patient.  Patient is slightly confused not very reliable history.  Unable to reach family.  Chief Complaint: Fall on the floor.  HPI: Martin Anthony is a 55 y.o. male with history of alcoholic liver cirrhosis with portal hypertension per EGD done in 2020 showed esophageal varices also has ascending colon mass which was biopsied to found tubular adenoma with high-grade dysplasia, paroxysmal atrial fibrillation, hyperglycemia was found on the floor and patient was brought to the ER.  He is not sure exactly who found him on the floor but patient states his sister found him.  Unable to reach any family member with the contact numbers provided in the chart.  Patient states he has been on the floor for couple of days he does not recall how he fell.  Patient states he does take his medicines.  Denies any chest pain or shortness of breath or abdominal pain or nausea vomiting or diarrhea.  He states he had some difficulty urinating.  He has some pain in the lower extremities between the ankle and the pelvic area.  He states he does drink alcohol but does not recall exactly when his last drink was  ED Course: In the ER patient is tremulous at times confused oriented to his name and date.  He does recall his date of birth.  On exam he has eschar on both ankles from the lateral aspect.  Appears cachectic and dehydrated.  Labs show markedly elevated creatinine of 4.5 which was normal in January of this year with BUN of 174 glucose 198 bicarb of 13 anion gap of 19 patient's lipase is mildly elevated at 54 AST of 479 ALT of 231 total bilirubin of 5.3 CK of 8313 hemoglobin of 9.8 WBC 13.1 ammonia level of 72 CT of the head did not show anything acute COVID test was negative and chest x-ray was unremarkable.  Patient was  given 2 L LR bolus and lactulose following which patient did have a bowel movement.  Still to get as a urine sample.  Patient admitted for acute renal failure with encephalopathy uremia rhabdomyolysis elevated LFTs.  Review of Systems: As per HPI, rest all negative.   Past Medical History:  Diagnosis Date   Anxiety    Depression    Diabetes mellitus without complication ([place])    GERD (gastroesophageal reflux disease)    Gout    Hyperlipidemia    Hypertension    Substance abuse ([place])    alcoholism     Past Surgical History:  Procedure Laterality Date   BIOPSY  01/19/2019   Procedure: BIOPSY;  Surgeon: Carol Ada, MD;  Location: [facility];  Service: Endoscopy;;   COLONOSCOPY N/A 01/19/2019   Procedure: COLONOSCOPY;  Surgeon: Carol Ada, MD;  Location: [facility];  Service: Endoscopy;  Laterality: N/A;   ESOPHAGOGASTRODUODENOSCOPY (EGD) WITH PROPOFOL N/A 01/19/2019   Procedure: ESOPHAGOGASTRODUODENOSCOPY (EGD) WITH PROPOFOL;  Surgeon: Carol Ada, MD;  Location: [facility];  Service: Endoscopy;  Laterality: N/A;   HEMOSTASIS CLIP PLACEMENT  01/19/2019   Procedure: HEMOSTASIS CLIP PLACEMENT;  Surgeon: Carol Ada, MD;  Location: [facility];  Service: Endoscopy;;   HERNIA REPAIR     umbilical    INGUINAL HERNIA REPAIR Bilateral    POLYPECTOMY  01/19/2019   Procedure: POLYPECTOMY;  Surgeon: Carol Ada, MD;  Location: [facility];  Service: Endoscopy;;     reports that he quit smoking about 19 years ago. He has never used smokeless tobacco. He reports current alcohol use of about 2.0 standard drinks per week. He reports that he does not use drugs.  No Known Allergies  Family History  Problem Relation Age of Onset   Hypertension Mother    Diabetes Father    Heart attack Father    Pneumonia Father    Hypertension Father    Diabetes Maternal Grandfather     Prior to Admission medications   Medication Sig Start Date End Date Taking? Authorizing Provider  ferrous  sulfate 325 (65 FE) MG tablet Take 1 tablet (325 mg total) by mouth 2 (two) times daily with a meal. 01/20/19 04/20/19  Mercy Riding, MD  furosemide (LASIX) 40 MG tablet Take 1 tablet (40 mg total) by mouth daily. 08/30/20   [name], MD  lactulose (CHRONULAC) 10 GM/15ML solution Take 30 mils (20 g total) 2-3 times a day for goal of 2-3 bowel movements a day. Patient not taking: Reported on 07/03/2020 01/20/19   Mercy Riding, MD  nadolol (CORGARD) 20 MG tablet Take 1 tablet (20 mg total) by mouth daily. Patient not taking: Reported on 07/03/2020 01/20/19   Mercy Riding, MD  omeprazole (PRILOSEC OTC) 20 MG tablet Take 20 mg by mouth daily. Patient not taking: Reported on 07/03/2020    [provider]  sertraline (ZOLOFT) 50 MG tablet TAKE 1 TABLET BY MOUTH EVERY DAY 06/05/20   [name], MD  spironolactone (ALDACTONE) 100 MG tablet TAKE 1 TABLET BY MOUTH EVERY DAY 04/21/21   [name], MD  thiamine 100 MG tablet Take 1 tablet (100 mg total) by mouth daily. Patient not taking: Reported on 07/03/2020 01/21/19   Mercy Riding, MD    Physical Exam: Constitutional: Moderately built and poorly nourished Vitals:   05/12/21 0130 05/12/21 0210 05/12/21 0305 05/12/21 0400  BP: (!) 181/110 124/82 125/75 127/75  Pulse: 84 84 82 79  Resp: 19 18 14 16   Temp:      TempSrc:      SpO2: 100% 100% 100% 100%   Eyes: Anicteric mild pallor. ENMT: No discharge from the ears eyes nose and mouth. Neck: No mass felt.  No neck rigidity. Respiratory: No rhonchi or crepitations. Cardiovascular: S1-S2 heard. Abdomen: Soft nontender bowel sounds present. Musculoskeletal: Bilateral ankle swelling with eschar on it. Skin: Eschar on both ankles. Neurologic: Alert awake oriented to name and place knows the date at times confused moving all extremities tremulous. Psychiatric: Appears confused but oriented to name and place.   Labs on Admission: I have personally reviewed following labs and imaging  studies  CBC: Recent Labs  Lab 05/12/21 0053  WBC 13.1*  NEUTROABS 10.8*  HGB 9.8*  HCT 29.6*  MCV 96.4  PLT 712   Basic Metabolic Panel: Recent Labs  Lab 05/12/21 0053  NA 144  K 5.1  CL 112*  CO2 13*  GLUCOSE 198*  BUN 174*  CREATININE 4.56*  CALCIUM 9.0   GFR: CrCl cannot be calculated (Unknown ideal weight.). Liver Function Tests: Recent Labs  Lab 05/12/21 0053  AST 479*  ALT 231*  ALKPHOS 100  BILITOT 5.3*  PROT 7.2  ALBUMIN 3.5   Recent Labs  Lab 05/12/21 0053  LIPASE 54*   Recent Labs  Lab 05/12/21 0053  AMMONIA 72*   Coagulation Profile: No results for input(s): INR, PROTIME  in the last 168 hours. Cardiac Enzymes: Recent Labs  Lab 05/12/21 0053  CKTOTAL 8,313*   BNP (last 3 results) No results for input(s): PROBNP in the last 8760 hours. HbA1C: No results for input(s): HGBA1C in the last 72 hours. CBG: No results for input(s): GLUCAP in the last 168 hours. Lipid Profile: No results for input(s): CHOL, HDL, LDLCALC, TRIG, CHOLHDL, LDLDIRECT in the last 72 hours. Thyroid Function Tests: No results for input(s): TSH, T4TOTAL, FREET4, T3FREE, THYROIDAB in the last 72 hours. Anemia Panel: No results for input(s): VITAMINB12, FOLATE, FERRITIN, TIBC, IRON, RETICCTPCT in the last 72 hours. Urine analysis:    Component Value Date/Time   COLORURINE YELLOW 01/18/2019 0435   APPEARANCEUR CLEAR 01/18/2019 0435   LABSPEC >1.046 (H) 01/18/2019 0435   PHURINE 5.0 01/18/2019 [place] 01/18/2019 0435   HGBUR NEGATIVE 01/18/2019 0435   BILIRUBINUR NEGATIVE 01/18/2019 0435   BILIRUBINUR 2+ 05/17/2015 1013   KETONESUR 5 (A) 01/18/2019 0435   PROTEINUR NEGATIVE 01/18/2019 0435   UROBILINOGEN 0.2 05/17/2015 1013   NITRITE NEGATIVE 01/18/2019 0435   LEUKOCYTESUR NEGATIVE 01/18/2019 0435   Sepsis Labs: @LABRCNTIP (procalcitonin:4,lacticidven:4) ) Recent Results (from the past 240 hour(s))  Resp Panel by RT-PCR (Flu A&B, Covid)  Nasopharyngeal Swab     Status: None   Collection Time: 05/12/21  1:00 AM   Specimen: Nasopharyngeal Swab; Nasopharyngeal(NP) swabs in vial transport medium  Result Value Ref Range Status   SARS Coronavirus 2 by RT PCR NEGATIVE NEGATIVE Final    Comment: (NOTE) SARS-CoV-2 target nucleic acids are NOT DETECTED.  The SARS-CoV-2 RNA is generally detectable in upper respiratory specimens during the acute phase of infection. The lowest concentration of SARS-CoV-2 viral copies this assay can detect is 138 copies/mL. A negative result does not preclude SARS-Cov-2 infection and should not be used as the sole basis for treatment or other patient management decisions. A negative result may occur with  improper specimen collection/handling, submission of specimen other than nasopharyngeal swab, presence of viral mutation(s) within the areas targeted by this assay, and inadequate number of viral copies(<138 copies/mL). A negative result must be combined with clinical observations, patient history, and epidemiological information. The expected result is Negative.  Fact Sheet for Patients:  EntrepreneurPulse.com.au  Fact Sheet for Healthcare Providers:  IncredibleEmployment.be  This test is no t yet approved or cleared by the [place] FDA and  has been authorized for detection and/or diagnosis of SARS-CoV-2 by FDA under an Emergency Use Authorization (EUA). This EUA will remain  in effect (meaning this test can be used) for the duration of the COVID-19 declaration under Section 564(b)(1) of the Act, 21 U.S.C.section 360bbb-3(b)(1), unless the authorization is terminated  or revoked sooner.       Influenza A by PCR NEGATIVE NEGATIVE Final   Influenza B by PCR NEGATIVE NEGATIVE Final    Comment: (NOTE) The Xpert Xpress SARS-CoV-2/FLU/RSV plus assay is intended as an aid in the diagnosis of influenza from Nasopharyngeal swab specimens and should not be  used as a sole basis for treatment. Nasal washings and aspirates are unacceptable for Xpert Xpress SARS-CoV-2/FLU/RSV testing.  Fact Sheet for Patients: EntrepreneurPulse.com.au  Fact Sheet for Healthcare Providers: IncredibleEmployment.be  This test is not yet approved or cleared by the [place] FDA and has been authorized for detection and/or diagnosis of SARS-CoV-2 by FDA under an Emergency Use Authorization (EUA). This EUA will remain in effect (meaning this test can be used) for the duration of the COVID-19  declaration under Section 564(b)(1) of the Act, 21 U.S.C. section 360bbb-3(b)(1), unless the authorization is terminated or revoked.  Performed at [facility], [place] [address], [place], [place] [address]      Radiological Exams on Admission: DG Chest 2 View  Result Date: 05/12/2021 CLINICAL DATA:  Anxious. History of hypertension and diabetes mellitus. EXAM: CHEST - 2 VIEW COMPARISON:  01/18/2019. FINDINGS: The heart size and mediastinal contours are within normal limits. Both lungs are clear. No acute osseous abnormality. IMPRESSION: No active cardiopulmonary disease. Electronically Signed   By: Brett Fairy M.D.   On: 05/12/2021 00:15   CT Head Wo Contrast  Result Date: 05/12/2021 CLINICAL DATA:  Initial evaluation for acute altered mental status. EXAM: CT HEAD WITHOUT CONTRAST TECHNIQUE: Contiguous axial images were obtained from the base of the skull through the vertex without intravenous contrast. COMPARISON:  None available. FINDINGS: Brain: Diffuse prominence of the CSF containing spaces compatible with generalized cerebral atrophy, advanced for age. Patchy hypodensity involving the supratentorial cerebral white matter most consistent with chronic small vessel ischemic disease. No evidence for acute intracranial hemorrhage. No findings to suggest acute large vessel territory infarct. No mass lesion, midline  shift, or mass effect. Ventricles are normal in size without evidence for hydrocephalus. No extra-axial fluid collection identified. Vascular: No hyperdense vessel identified.Calcified atherosclerosis present at the skull base. Skull: Scalp soft tissues demonstrate no acute abnormality. Calvarium intact. Sinuses/Orbits: Globes and orbital soft tissues within normal limits. Visualized paranasal sinuses are clear. No mastoid effusion. IMPRESSION: 1. No acute intracranial abnormality. 2. Generalized cerebral atrophy with chronic microvascular ischemic disease, advanced for age. Electronically Signed   By: Jeannine Boga M.D.   On: 05/12/2021 00:17      Assessment/Plan Principal Problem:   ARF (acute renal failure) (HCC) Active Problems:   Essential hypertension   Diabetes mellitus without complication (HCC)   Alcohol abuse   Rhabdomyolysis   Elevated LFTs   Acute encephalopathy    Acute renal failure with anion gap metabolic acidosis -exact cause not clear.  Patient not able to give a good history.  Patient is yet to give a sample of his urine.  I have ordered bladder scan and CT renal study to make sure there is no obstruction.  Will order Foley catheter.  Patient did receive 2 L of LR.  We will order further fluids based on the CT scan results to make sure there is no obstruction.  ABG and UA and urine studies are pending.  Patient probably will need nephrology input. Anion gap metabolic acidosis could be from renal failure we will check in addition ABG, salicylate levels and lactic acid levels. Acute encephalopathy could be from uremia and hepatic encephalopathy patient did have a bowel movement after being given lactulose.  Check in addition to following ammonia levels we will check thiamine level, B12 and TSH.  We will continue with lactulose.  Closely monitor respiratory status. Rhabdomyolysis could be from fall.  Patient received fluids and recheck CK levels continue to monitor. History  of liver cirrhosis with portal hypertension secondary to alcoholism -EGD in 2020 showed esophageal varices.  Patient's PT/INR is still pending.  We will calculate the MELD score.  Will need GI evaluation. Elevated LFTs -differentials include alcoholic hepatitis we will check acute hepatitis panel.  Could be part of the rhabdomyolysis.  Tylenol levels are pending.  INR is pending. Bilateral ankle eschar seen.  Some swelling in the ankle.  X-rays are pending.  Since patient is  having pain on moving his both lower extremities in addition to the ankle x-rays I have ordered x-rays of the pelvis.  We will keep patient empirically on antibiotics for now. Alcohol abuse -patient states he does drink alcohol but not as much as before.  Patient appears tremulous.  We will keep patient on CIWA.  Thiamine.  Follow thiamine levels.  I will keep patient on 500 mg 3 times daily dose of thiamine after thiamine levels obtained. Hyperglycemia we will check hemoglobin A1c. History of paroxysmal atrial fibrillation we will get an EKG. History of ascending colon mass for which patient had a colonoscopy in 2020 and biopsy showed tubular adenoma which was not completely resected not sure if patient had followed up.  Since patient has acute renal failure with multiple other acute findings including elevated LFTs encephalopathy will need close monitoring for further management and inpatient status.  Will need to get further history from patient's family once available.   DVT prophylaxis: SCDs for now until we get INR levels. Code Status: Full code. Family Communication: Unable to reach family. Disposition Plan: To be determined. Consults called: Will need to consult nephrology and GI. Admission status: Inpatient.   Rise Patience MD [facility] Pager [phone].  If 7PM-7AM, please contact night-coverage [email or web address] Password [email or web address]  05/12/2021, 5:13 AM

## 2021-05-12 NOTE — ED Notes (Signed)
Pt turned, diaper changed from BM

## 2021-05-12 NOTE — ED Notes (Signed)
Pt sleeping, with mitts applied to both hands. VSS.

## 2021-05-12 NOTE — ED Notes (Signed)
Pt difficult to arouse, ativan held

## 2021-05-12 NOTE — Progress Notes (Addendum)
PROGRESS NOTE    Martin Anthony  JME:[id number] DOB: [birth year] DOA: 05/11/2021 PCP: [name], MD    Brief Narrative:  Martin Anthony is a 55 year old male with past medical history significant for EtOH cirrhosis with portal hypertension, esophageal varices, Hx ascending colon tubular adenoma with high-grade dysplasia, continued EtOH use disorder, paroxysmal atrial fibrillation who presented to [facility] ED by EMS on 11/27 after being found on the floor by family members.  On EMS arrival, patient was found naked on his living room floor, anxious, confused and hyper fixated on his hands.  Patient reported that he has been on the floor for "a couple of days".  Does not recall how he fell.  Reports that he complies with his home medication regimen.  Recently reports difficulty urinating.  Endorses continued alcohol use, does not recall last drink and does endorse history of withdrawal symptoms.  In the ED, patient was noted to be tremulous, confused.  Temperature 98.3 F, HR 84, RR 18, BP 125/106, SPO2 98% on room air.  Sodium 144, potassium 5.1, chloride 112, CO2 13, glucose 198, BUN 174, creatinine 4.56, lipase 54, AST 479, ALT 231.  WBC 13.1, hemoglobin 9.8, platelets 309.  INR 1.6.  COVID-19 PCR negative peer influenza A/B PCR negative.  Total CK 8313.  EtOH level less than 10.  Salicylate level less than 7.0.  Patient was given 2 L LR bolus, lactulose by EDP.  Patient admitted for acute renal failure with encephalopathy, uremia, rhabdomyolysis and elevated LFTs.   Assessment & Plan:   Principal Problem:   ARF (acute renal failure) (HCC) Active Problems:   Essential hypertension   Diabetes mellitus without complication (HCC)   Alcohol abuse   Rhabdomyolysis   Elevated LFTs   Acute encephalopathy   Acute toxic metabolic encephalopathy, POA Patient presenting to the ED via EMS after being found down in his home confused.  CT head without contrast with no acute intracranial findings, does note  generalized atrophy and chronic microvascular ischemic change.  Etiology likely multifactorial with continued EtOH abuse, likely experiencing withdrawal symptoms, uremia from acute renal failure, hepatic encephalopathy, and rhabdomyolysis. --UDS: Pending --Supportive care, treatment as below  Acute renal failure Anion gap metabolic acidosis Creatinine elevated 4.56 on admission with serum bicarbonate level 13, anion gap of 19, BUN 174.  Previous creatinine baseline 0.66 07/03/2020.  CT abdomen/pelvis with no acute findings, kidneys appeared to be normal in appearance.  Unclear etiology, dehydration versus ATN from rhabdomyolysis versus hepatorenal syndrome --Nephrology following, appreciate assistance --Cr 4.56>4.84 --BUN 174>181 --Sodium bicarbonate drip --Urinalysis pending --Foley catheter placed for strict I's and O's --If no significant improvement, may need CRRT per nephrology --Strict I's and O's --BMP daily  Hepatic encephalopathy --Ammonia 72>133 --Lactulose 20g PO TID; goal 2-3 soft BMs daily --repeat ammonia level in am  EtOH cirrhosis with portal hypertension, esophageal varices Upon presentation, AST 402, ALT 217; consistent with alcohol abuse.  Total bilirubin 5.3.  EGD 01/19/2019 with findings of large varices middle/lower third esophagus with portal hypertensive gastropathy.  Hemoglobin stable 9.4.  Acute hepatitis panel negative. --MELD-Na = 31; correlates to 27-32% 90-day mortality --MDF = 23.4; no indication for steroid use currently --Ceftriaxone 2 g IV every 24 hours to cover for possible SBP --Holding home nadolol 20 mg p.o. daily, furosemide 40 mg p.o. daily, spironolactone 100 mg p.o. daily --Follow CMP daily  Rhabdomyolysis Patient found on floor by family, unknown downtime.  CK level elevated on admission to 8313. --Continue IV fluid  hydration --Repeat CK level in a.m.  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  Was instructed to follow-up with gastroenterology and colorectal surgery following discharge in August 2020, unclear if this was accomplished.  EtOH withdrawal EtOH level less than 10 on arrival, endorses 4-6 beers per day.  Unable to ascertain to last drink. --CIWA protocol with symptom triggered Ativan --Thiamine, multivitamin, folic acid --Will need EtOH cessation counseling once cephalopathy improved  Paroxysmal atrial fibrillation Currently in normal sinus rhythm, not on medications outpatient. --Continue monitor on telemetry  Depression: Zoloft 50 mg p.o. daily   DVT prophylaxis: Place and maintain sequential compression device Start: 05/12/21 0544   Code Status: Full Code Family Communication: No family present at bedside this morning  Disposition Plan:  Level of care: Progressive Status is: Inpatient  Remains inpatient appropriate because: Worsening kidney function, IV fluids, IV antibiotics, active withdrawal symptoms, remains encephalopathic   Consultants:  Nephrology, Dr. Jonnie Finner  Procedures:  Foley catheter  Antimicrobials:  Ceftriaxone 11/28>>    Subjective: Patient seen examined at bedside, resting comfortably.  Remains in ED holding area awaiting transfer to [facility].  Confused and tremulous, in mittens.  No family present at bedside.  No acute concerns this morning per nursing staff.  Consulted nephrology for evaluation.  Objective: Vitals:   05/12/21 0840 05/12/21 1128 05/12/21 1130 05/12/21 1141  BP: 116/76  (!) 116/96 (!) 116/96  Pulse: 89 86 84 84  Resp: 14 16 16    Temp:      TempSrc:      SpO2: 99% 99% 100%     Intake/Output Summary (Last 24 hours) at 05/12/2021 1333 Last data filed at 05/12/2021 9528 Gross per 24 hour  Intake 2000 ml  Output --   Net 2000 ml   There were no vitals filed for this visit.  Examination:  General exam: confused, tremulous, mittens in place, chronically ill in appearance Respiratory system: Clear to auscultation. Respiratory effort normal.  On room air Cardiovascular system: S1 & S2 heard, RRR. No JVD, murmurs, rubs, gallops or clicks. No pedal edema. Gastrointestinal system: Abdomen is mildly distended, soft, mild generalized tenderness. No organomegaly or masses felt. Normal bowel sounds heard. Central nervous system: Alert, oriented to place [facility]); but not time or person.  Noted hand tremors, otherwise no focal neurological deficits. Extremities: Symmetric 5 x 5 power. Skin: No rashes, lesions or ulcers Psychiatry: Judgement and insight appear poor.  Anxious mood.    Data Reviewed: I have personally reviewed following labs and imaging studies  CBC: Recent Labs  Lab 05/12/21 0053 05/12/21 0522  WBC 13.1* 11.9*  NEUTROABS 10.8* 9.5*  HGB 9.8* 9.4*  HCT 29.6* 28.1*  MCV 96.4 95.9  PLT 309 413   Basic Metabolic Panel: Recent Labs  Lab 05/12/21 0053 05/12/21 0522  NA 144 146*  K 5.1 4.6  CL 112* 113*  CO2 13* 13*  GLUCOSE 198* 174*  BUN 174* 181*  CREATININE 4.56* 4.84*  CALCIUM 9.0 9.1   GFR: CrCl cannot be calculated (Unknown ideal weight.). Liver Function Tests: Recent Labs  Lab 05/12/21 0053 05/12/21 0522  AST 479* 402*  ALT 231* 217*  ALKPHOS 100 95  BILITOT 5.3* 4.5*  PROT 7.2 6.9  ALBUMIN 3.5 3.4*   Recent Labs  Lab 05/12/21 0053  LIPASE 54*  Recent Labs  Lab 05/12/21 0053 05/12/21 0522  AMMONIA 72* 133*   Coagulation Profile: Recent Labs  Lab 05/12/21 0522  INR 1.6*   Cardiac Enzymes: Recent Labs  Lab 05/12/21 0053  CKTOTAL 8,313*   BNP (last 3 results) No results for input(s): PROBNP in the last 8760 hours. HbA1C: No results for input(s): HGBA1C in the last 72 hours. CBG: No results for input(s): GLUCAP in the last 168  hours. Lipid Profile: No results for input(s): CHOL, HDL, LDLCALC, TRIG, CHOLHDL, LDLDIRECT in the last 72 hours. Thyroid Function Tests: No results for input(s): TSH, T4TOTAL, FREET4, T3FREE, THYROIDAB in the last 72 hours. Anemia Panel: Recent Labs    05/12/21 0522  VITAMINB12 4,672*  FOLATE 24.8  FERRITIN 175  TIBC 318  IRON 18*  RETICCTPCT 4.1*   Sepsis Labs: Recent Labs  Lab 05/12/21 0522  LATICACIDVEN 1.9    Recent Results (from the past 240 hour(s))  Resp Panel by RT-PCR (Flu A&B, Covid) Nasopharyngeal Swab     Status: None   Collection Time: 05/12/21  1:00 AM   Specimen: Nasopharyngeal Swab; Nasopharyngeal(NP) swabs in vial transport medium  Result Value Ref Range Status   SARS Coronavirus 2 by RT PCR NEGATIVE NEGATIVE Final    Comment: (NOTE) SARS-CoV-2 target nucleic acids are NOT DETECTED.  The SARS-CoV-2 RNA is generally detectable in upper respiratory specimens during the acute phase of infection. The lowest concentration of SARS-CoV-2 viral copies this assay can detect is 138 copies/mL. A negative result does not preclude SARS-Cov-2 infection and should not be used as the sole basis for treatment or other patient management decisions. A negative result may occur with  improper specimen collection/handling, submission of specimen other than nasopharyngeal swab, presence of viral mutation(s) within the areas targeted by this assay, and inadequate number of viral copies(<138 copies/mL). A negative result must be combined with clinical observations, patient history, and epidemiological information. The expected result is Negative.  Fact Sheet for Patients:  EntrepreneurPulse.com.au  Fact Sheet for Healthcare Providers:  IncredibleEmployment.be  This test is no t yet approved or cleared by the [place] FDA and  has been authorized for detection and/or diagnosis of SARS-CoV-2 by FDA under an Emergency Use  Authorization (EUA). This EUA will remain  in effect (meaning this test can be used) for the duration of the COVID-19 declaration under Section 564(b)(1) of the Act, 21 U.S.C.section 360bbb-3(b)(1), unless the authorization is terminated  or revoked sooner.       Influenza A by PCR NEGATIVE NEGATIVE Final   Influenza B by PCR NEGATIVE NEGATIVE Final    Comment: (NOTE) The Xpert Xpress SARS-CoV-2/FLU/RSV plus assay is intended as an aid in the diagnosis of influenza from Nasopharyngeal swab specimens and should not be used as a sole basis for treatment. Nasal washings and aspirates are unacceptable for Xpert Xpress SARS-CoV-2/FLU/RSV testing.  Fact Sheet for Patients: EntrepreneurPulse.com.au  Fact Sheet for Healthcare Providers: IncredibleEmployment.be  This test is not yet approved or cleared by the [place] FDA and has been authorized for detection and/or diagnosis of SARS-CoV-2 by FDA under an Emergency Use Authorization (EUA). This EUA will remain in effect (meaning this test can be used) for the duration of the COVID-19 declaration under Section 564(b)(1) of the Act, 21 U.S.C. section 360bbb-3(b)(1), unless the authorization is terminated or revoked.  Performed at [facility], [place] [address] [place], [place] [address]          Radiology Studies: DG Chest 2  View  Result Date: 05/12/2021 CLINICAL DATA:  Anxious. History of hypertension and diabetes mellitus. EXAM: CHEST - 2 VIEW COMPARISON:  01/18/2019. FINDINGS: The heart size and mediastinal contours are within normal limits. Both lungs are clear. No acute osseous abnormality. IMPRESSION: No active cardiopulmonary disease. Electronically Signed   By: Brett Fairy M.D.   On: 05/12/2021 00:15   DG Pelvis 1-2 Views  Result Date: 05/12/2021 CLINICAL DATA:  Fall today EXAM: PELVIS - 1-2 VIEW COMPARISON:  None. FINDINGS: There is no evidence of pelvic  fracture or diastasis. No pelvic bone lesions are seen. IMPRESSION: Negative. Electronically Signed   By: Jorje Guild M.D.   On: 05/12/2021 07:06   CT Head Wo Contrast  Result Date: 05/12/2021 CLINICAL DATA:  Initial evaluation for acute altered mental status. EXAM: CT HEAD WITHOUT CONTRAST TECHNIQUE: Contiguous axial images were obtained from the base of the skull through the vertex without intravenous contrast. COMPARISON:  None available. FINDINGS: Brain: Diffuse prominence of the CSF containing spaces compatible with generalized cerebral atrophy, advanced for age. Patchy hypodensity involving the supratentorial cerebral white matter most consistent with chronic small vessel ischemic disease. No evidence for acute intracranial hemorrhage. No findings to suggest acute large vessel territory infarct. No mass lesion, midline shift, or mass effect. Ventricles are normal in size without evidence for hydrocephalus. No extra-axial fluid collection identified. Vascular: No hyperdense vessel identified.Calcified atherosclerosis present at the skull base. Skull: Scalp soft tissues demonstrate no acute abnormality. Calvarium intact. Sinuses/Orbits: Globes and orbital soft tissues within normal limits. Visualized paranasal sinuses are clear. No mastoid effusion. IMPRESSION: 1. No acute intracranial abnormality. 2. Generalized cerebral atrophy with chronic microvascular ischemic disease, advanced for age. Electronically Signed   By: Jeannine Boga M.D.   On: 05/12/2021 00:17   DG Foot 2 Views Left  Result Date: 05/12/2021 CLINICAL DATA:  Swelling and sores. EXAM: LEFT FOOT - 2 VIEW COMPARISON:  None. FINDINGS: Study limited by overlying artifact from clothing. Within this limitation, there is no evidence of fracture or dislocation. No gross bony destruction evident to suggest osteomyelitis. IMPRESSION: Negative. Electronically Signed   By: Misty Stanley M.D.   On: 05/12/2021 06:50   DG Foot 2 Views  Right  Result Date: 05/12/2021 CLINICAL DATA:  Swelling and sores. EXAM: RIGHT FOOT - 2 VIEW COMPARISON:  None. FINDINGS: Study limited by superimposition of the distal toes on both images. Overlying artifact from clothing evident. Within these limitations, no fracture or dislocation. No gross bony destruction to suggest osteomyelitis. IMPRESSION: Negative. Electronically Signed   By: Misty Stanley M.D.   On: 05/12/2021 06:49   CT RENAL STONE STUDY  Result Date: 05/12/2021 CLINICAL DATA:  Flank pain. EXAM: CT ABDOMEN AND PELVIS WITHOUT CONTRAST TECHNIQUE: Multidetector CT imaging of the abdomen and pelvis was performed following the standard protocol without IV contrast. COMPARISON:  01/18/2019 FINDINGS: Lower chest: Small peripheral areas of ground-glass opacity are seen in the anterior right middle lobe (18/4) and anterior left upper lobe (image 5/series 4). Atelectasis noted posterior left base. No evidence for pleural effusion. Hepatobiliary: Nodular liver contour is compatible with cirrhosis. Gallbladder lumen shows high attenuation, likely sludge although stones not excluded. No intrahepatic or extrahepatic biliary dilation. Pancreas: No focal mass lesion. No dilatation of the main duct. No intraparenchymal cyst. No peripancreatic edema. Spleen: Splenic volume measures 532 cc with craniocaudal length of 13.6 cm, findings consistent with splenomegaly. Adrenals/Urinary Tract: No adrenal nodule or mass. Kidneys unremarkable. No evidence for hydroureter. Foley catheter decompresses  the urinary bladder. Gas in the bladder lumen is compatible with the instrumentation. Stomach/Bowel: Stomach is unremarkable. No gastric wall thickening. No evidence of outlet obstruction. Duodenum is normally positioned as is the ligament of Treitz. No small bowel wall thickening. No small bowel dilatation. The terminal ileum is normal. The appendix is normal. No gross colonic mass. No colonic wall thickening.  Vascular/Lymphatic: There is moderate atherosclerotic calcification of the abdominal aorta without aneurysm. There is no gastrohepatic or hepatoduodenal ligament lymphadenopathy. No retroperitoneal or mesenteric lymphadenopathy. No pelvic sidewall lymphadenopathy. Reproductive: Unremarkable. Other: No intraperitoneal free fluid. Musculoskeletal: No worrisome lytic or sclerotic osseous abnormality. IMPRESSION: 1. No acute findings in the abdomen or pelvis. Specifically, no findings to explain the patient's history of flank pain. 2. Cirrhotic changes in the liver with splenomegaly. 3. Small peripheral areas of ground-glass opacity in the right middle lobe and left upper lobe. Imaging features are nonspecific but may be infectious/inflammatory. Follow-up chest CT in 3 months could be used to ensure resolution. 4. Aortic Atherosclerosis (ICD10-I70.0). Electronically Signed   By: Misty Stanley M.D.   On: 05/12/2021 06:57        Scheduled Meds:  folic acid  1 mg Oral Daily   lactulose  20 g Oral TID   multivitamin with minerals  1 tablet Oral Daily   thiamine  100 mg Oral Daily   Or   thiamine  100 mg Intravenous Daily   Continuous Infusions:  cefTRIAXone (ROCEPHIN)  IV Stopped (05/12/21 0841)   dextrose     sodium bicarbonate in 0.45 NS mL infusion 75 mL/hr at 05/12/21 0654     LOS: 0 days    Time spent: 45 minutes spent on chart review, discussion with nursing staff, consultants, updating family and interview/physical exam; more than 50% of that time was spent in counseling and/or coordination of care.    Betzabeth Derringer J [place] DO [facility] Available via Epic secure chat 7am-7pm After these hours, please refer to coverage provider listed on [email or web address] 05/12/2021, 1:33 PM

## 2021-05-12 NOTE — ED Notes (Signed)
PrimoFit placed on pt to collect UA Will continue to monitor for output

## 2021-05-12 NOTE — ED Notes (Signed)
As RN walked into pt.'s room it was noted that pt. Ripped all EKG electrodes off, and taken IV out. Mittens placed on pt. For safety.

## 2021-05-13 ENCOUNTER — Inpatient Hospital Stay (HOSPITAL_COMMUNITY): Payer: Self-pay

## 2021-05-13 DIAGNOSIS — K7010 Alcoholic hepatitis without ascites: Secondary | ICD-10-CM

## 2021-05-13 HISTORY — PX: IR FLUORO GUIDE CV LINE RIGHT: IMG2283

## 2021-05-13 HISTORY — PX: IR US GUIDE VASC ACCESS RIGHT: IMG2390

## 2021-05-13 LAB — TYPE AND SCREEN
ABO/RH(D): O POS
Antibody Screen: NEGATIVE

## 2021-05-13 LAB — CBC
HCT: 23.4 % — ABNORMAL LOW (ref 39.0–52.0)
Hemoglobin: 7.6 g/dL — ABNORMAL LOW (ref 13.0–17.0)
MCH: 31 pg (ref 26.0–34.0)
MCHC: 32.5 g/dL (ref 30.0–36.0)
MCV: 95.5 fL (ref 80.0–100.0)
Platelets: 201 10*3/uL (ref 150–400)
RBC: 2.45 MIL/uL — ABNORMAL LOW (ref 4.22–5.81)
RDW: 14.4 % (ref 11.5–15.5)
WBC: 7.9 10*3/uL (ref 4.0–10.5)
nRBC: 0.3 % — ABNORMAL HIGH (ref 0.0–0.2)

## 2021-05-13 LAB — COMPREHENSIVE METABOLIC PANEL
ALT: 175 U/L — ABNORMAL HIGH (ref 0–44)
AST: 244 U/L — ABNORMAL HIGH (ref 15–41)
Albumin: 2.3 g/dL — ABNORMAL LOW (ref 3.5–5.0)
Alkaline Phosphatase: 88 U/L (ref 38–126)
Anion gap: 10 (ref 5–15)
BUN: 164 mg/dL — ABNORMAL HIGH (ref 6–20)
CO2: 18 mmol/L — ABNORMAL LOW (ref 22–32)
Calcium: 8 mg/dL — ABNORMAL LOW (ref 8.9–10.3)
Chloride: 115 mmol/L — ABNORMAL HIGH (ref 98–111)
Creatinine, Ser: 4.68 mg/dL — ABNORMAL HIGH (ref 0.61–1.24)
GFR, Estimated: 14 mL/min — ABNORMAL LOW (ref >60)
Glucose, Bld: 209 mg/dL — ABNORMAL HIGH (ref 70–99)
Potassium: 3.6 mmol/L (ref 3.5–5.1)
Sodium: 143 mmol/L (ref 135–145)
Total Bilirubin: 2.4 mg/dL — ABNORMAL HIGH (ref 0.3–1.2)
Total Protein: 5.3 g/dL — ABNORMAL LOW (ref 6.5–8.1)

## 2021-05-13 LAB — HEMOGLOBIN A1C
Hgb A1c MFr Bld: 5.8 % — ABNORMAL HIGH (ref 4.8–5.6)
Mean Plasma Glucose: 120 mg/dL

## 2021-05-13 LAB — LACTIC ACID, PLASMA: Lactic Acid, Venous: 1.4 mmol/L (ref 0.5–1.9)

## 2021-05-13 LAB — AMMONIA: Ammonia: 38 umol/L — ABNORMAL HIGH (ref 9–35)

## 2021-05-13 LAB — CK: Total CK: 2167 U/L — ABNORMAL HIGH (ref 49–397)

## 2021-05-13 MED ORDER — PANTOPRAZOLE SODIUM 40 MG IV SOLR
40.0000 mg | INTRAVENOUS | Status: DC
  Administered 2021-05-13 – 2021-05-16 (×4): 40 mg via INTRAVENOUS
  Filled 2021-05-13 (×4): qty 40

## 2021-05-13 MED ORDER — GERHARDT'S BUTT CREAM
TOPICAL_CREAM | Freq: Every day | CUTANEOUS | Status: DC
  Administered 2021-05-20: 1 via TOPICAL
  Filled 2021-05-13: qty 1

## 2021-05-13 MED ORDER — METHYLPREDNISOLONE SODIUM SUCC 40 MG IJ SOLR
30.0000 mg | Freq: Every day | INTRAMUSCULAR | Status: DC
Start: 2021-05-13 — End: 2021-05-16
  Administered 2021-05-13 – 2021-05-16 (×4): 30 mg via INTRAVENOUS
  Filled 2021-05-13 (×3): qty 1

## 2021-05-13 MED ORDER — CHLORHEXIDINE GLUCONATE CLOTH 2 % EX PADS
6.0000 | MEDICATED_PAD | Freq: Every day | CUTANEOUS | Status: DC
  Administered 2021-05-13 – 2021-05-14 (×2): 6 via TOPICAL

## 2021-05-13 MED ORDER — LACTULOSE ENEMA
300.0000 mL | Freq: Two times a day (BID) | RECTAL | Status: DC
Start: 2021-05-13 — End: 2021-05-16
  Administered 2021-05-13 – 2021-05-15 (×5): 300 mL via RECTAL
  Filled 2021-05-13 (×7): qty 300

## 2021-05-13 MED ORDER — CHLORHEXIDINE GLUCONATE CLOTH 2 % EX PADS
6.0000 | MEDICATED_PAD | Freq: Every day | CUTANEOUS | Status: DC
  Administered 2021-05-14 – 2021-05-16 (×2): 6 via TOPICAL

## 2021-05-13 MED ORDER — FOLIC ACID 5 MG/ML IJ SOLN
1.0000 mg | Freq: Every day | INTRAMUSCULAR | Status: DC
Start: 2021-05-13 — End: 2021-05-16
  Administered 2021-05-13 – 2021-05-16 (×4): 1 mg via INTRAVENOUS
  Filled 2021-05-13 (×6): qty 0.2

## 2021-05-13 MED ORDER — METHYLPREDNISOLONE SODIUM SUCC 40 MG IJ SOLR: 40.0000 mg | Freq: Every day | INTRAMUSCULAR | Status: DC

## 2021-05-13 MED ORDER — COLLAGENASE 250 UNIT/GM EX OINT
TOPICAL_OINTMENT | Freq: Every day | CUTANEOUS | Status: DC
  Filled 2021-05-13 (×2): qty 30

## 2021-05-13 MED ORDER — DAKINS (1/4 STRENGTH) 0.125 % EX SOLN
Freq: Two times a day (BID) | CUTANEOUS | Status: AC
  Filled 2021-05-13 (×2): qty 473

## 2021-05-13 MED ORDER — LIDOCAINE-EPINEPHRINE 1 %-1:100000 IJ SOLN
INTRAMUSCULAR | Status: DC | PRN
  Administered 2021-05-13: 20 mL

## 2021-05-13 NOTE — Progress Notes (Signed)
PROGRESS NOTE        PATIENT DETAILS Name: Martin Anthony Age: 55 y.o. Sex: male Date of Birth: 06-Apr-1966 Admit Date: 05/11/2021 Admitting Physician Rise Patience, MD PCP:Fry, Ishmael Holter, MD  Brief Narrative: Patient is a 55 y.o. male with history of liver cirrhosis, EtOH use, PAF-who presented with confusion/found down at home (found by ex wife)-patient was found to have acute metabolic encephalopathy in the setting of AKI/hepatic encephalopathy.  Initially monitored at [facility] transferred to [facility] for potential dialysis.  See below for details.  Subjective: Arouses to a loud verbal stimuli and a gentle sternal rub.  Confused.  Mumbles.  Objective: Vitals: Blood pressure 107/60, pulse 71, temperature (!) 97 F (36.1 C), temperature source Axillary, resp. rate 17, SpO2 (!) 89 %.   Exam: Gen Exam: Confused/lethargic-not following commands. HEENT:atraumatic, normocephalic Chest: B/L clear to auscultation anteriorly CVS:S1S2 regular Abdomen:soft non tender, non distended Extremities:no edema Neurology: Difficult exam but seems to be moving all 4 extremities Skin: no rash  Pertinent Labs/Radiology: Recent Labs  Lab 05/12/21 0053 05/12/21 0522 05/13/21 0318  WBC 13.1* 11.9* 7.9  HGB 9.8* 9.4* 7.6*  PLT 309 257 201  NA 144 146* 143  K 5.1 4.6 3.6  CREATININE 4.56* 4.84* 4.68*  AST 479* 402* 244*  ALT 231* 217* 175*  ALKPHOS 100 95 88  BILITOT 5.3* 4.5* 2.4*    Assessment/Plan: Acute metabolic encephalopathy: Multifactorial-due to uremic encephalopathy, hepatic encephalopathy, possible alcohol withdrawal symptoms.  CT head without any acute abnormalities.  Ammonia levels downtrending.  Plans are to place dialysis catheter and initiate hemodialysis-hopeful that patient's mentation will improve with HD.  If mentation continues to be worse after initiation of HD-May need to initiate further work-up.  AKI: Suspicion for hemodynamically mediated  kidney injury-no hydronephrosis seen on CT imaging.  Continues to have persistently elevated BUN/creatinine levels-discussed with nephrologist Dr. Jonnie Finner today-recommendations are to proceed with IR consultation for dialysis catheter-and initiate hemodialysis.  Alcoholic hepatitis: LFTs slowly improving with supportive care.  Acute hepatitis serology was negative.  Patient's discriminant function is around 36-hence will place patient on steroids.  Normocytic anemia: Due to acute illness-AKI-no overt signs of GI bleeding/blood loss-plan to transfuse if hb< 7  Rhabdomyolysis: Due to being down on the floor prior to this hospitalization-CK levels improving.  Liver cirrhosis/portal hypertension/esophageal varices: Continue supportive care-hold diuretics given AKI-beta-blocker remains on hold until patient's mentation improves and is able to take oral agents.  Hepatic encephalopathy: Continue lactulose-but will change to this enema as patient mental status precludes any oral intake at this point.  Switch back to oral regimen when able.    Alcohol withdrawal EtOH use/alcohol withdrawal: On CIWA protocol-severely encephalopathic-with plans to initiate HD later today.  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   Depression: Resume Zoloft when oral intake resumes-when mentation improves.  BMI Estimated body mass index is 23.97 kg/m as calculated from the following:   Height as of 07/03/20: 6\' 3"  (1.905 m).   Weight as of 07/03/20: 87 kg.   Wound care Pressure Injury 05/13/21 Ankle Right;Lateral Unstageable - Full thickness tissue loss in which the base of the injury is covered by slough (yellow,  tan, gray, green or brown) and/or eschar (tan, brown or  black) in the wound bed. (Active)  05/13/21 0400  Location: Ankle  Location Orientation: Right;Lateral  Staging: Unstageable - Full thickness tissue loss in which the base of the injury is covered by slough (yellow, tan, gray, green or brown) and/or eschar (tan, brown or black) in the wound bed.  Wound Description (Comments):   Present on Admission: Yes     Pressure Injury 05/13/21 Ankle Left;Lateral Unstageable - Full thickness tissue loss in which the base of the injury is covered by slough (yellow, tan, gray, green or brown) and/or eschar (tan, brown or black) in the wound bed. (Active)  05/13/21 0400  Location: Ankle  Location Orientation: Left;Lateral  Staging: Unstageable - Full thickness tissue loss in which the base of the injury is covered by slough (yellow, tan, gray, green or brown) and/or eschar (tan, brown or black) in the wound bed.  Wound Description (Comments):   Present on Admission: Yes     Pressure Injury 05/13/21 Coccyx Unstageable - Full thickness tissue loss in which the base of the injury is covered by slough (yellow, tan, gray, green or brown) and/or eschar (tan, brown or black) in the wound bed. (Active)  05/13/21 0400  Location: Coccyx  Location Orientation:   Staging: Unstageable - Full thickness tissue loss in which the base of the injury is covered by slough (yellow, tan, gray, green or brown) and/or eschar (tan, brown or black) in the wound bed.  Wound Description (Comments):   Present on Admission: Yes     Pressure Injury 05/13/21 Buttocks Left;Lower (Active)  05/13/21 0400  Location: Buttocks  Location Orientation: Left;Lower  Staging:   Wound Description (Comments):   Present on Admission: Yes     Pressure Injury 05/13/21 Buttocks Right (Active)  05/13/21 0400  Location: Buttocks  Location Orientation: Right  Staging:   Wound Description (Comments):   Present on Admission: Yes      Procedures: None Consults: Renal DVT Prophylaxis:  SCD Code Status:Full code  Family Communication: Ex wife-[name] [phone] on 11/29 over the phone  Time spent: 45 minutes-Greater than 50% of this time was spent in counseling, explanation of diagnosis, planning of further management, and coordination of care.   Disposition Plan: Status is: Inpatient  Remains inpatient appropriate because: AKI/encephalopathy   Diet: Diet Order             Diet NPO time specified  Diet effective now                     Antimicrobial agents: Anti-infectives (From admission, onward)    Start     Dose/Rate Route Frequency Ordered Stop   05/12/21 0715  cefTRIAXone (ROCEPHIN) 2 g in sodium chloride 0.9 % 100 mL IVPB        2 g 200 mL/hr over 30 Minutes Intravenous Every 24 hours 05/12/21 0701          MEDICATIONS: Scheduled Meds:  Chlorhexidine Gluconate Cloth  6 each Topical Daily   [START ON 05/16/2021] collagenase   Topical Daily   folic acid  1 mg Oral Daily   Gerhardt's butt cream   Topical Daily   lactulose  20 g Oral TID   multivitamin with minerals  1 tablet Oral Daily   sertraline  50 mg Oral Daily   sodium hypochlorite   Irrigation BID   thiamine  100 mg Oral Daily   Or   thiamine  100 mg Intravenous  Daily   Continuous Infusions:  cefTRIAXone (ROCEPHIN)  IV 2 g (05/13/21 1042)   dextrose 125 mL/hr at 05/13/21 0600   PRN Meds:.LORazepam **OR** LORazepam   I have personally reviewed following labs and imaging studies  LABORATORY DATA: CBC: Recent Labs  Lab 05/12/21 0053 05/12/21 0522 05/13/21 0318  WBC 13.1* 11.9* 7.9  NEUTROABS 10.8* 9.5*  --   HGB 9.8* 9.4* 7.6*  HCT 29.6* 28.1* 23.4*  MCV 96.4 95.9 95.5  PLT 309 257 010    Basic Metabolic Panel: Recent Labs  Lab 05/12/21 0053 05/12/21 0522 05/13/21 0318  NA 144 146* 143  K 5.1 4.6 3.6  CL 112* 113* 115*  CO2 13* 13* 18*  GLUCOSE 198* 174* 209*  BUN 174* 181* 164*  CREATININE 4.56* 4.84* 4.68*  CALCIUM 9.0 9.1 8.0*    GFR: CrCl  cannot be calculated (Unknown ideal weight.).  Liver Function Tests: Recent Labs  Lab 05/12/21 0053 05/12/21 0522 05/13/21 0318  AST 479* 402* 244*  ALT 231* 217* 175*  ALKPHOS 100 95 88  BILITOT 5.3* 4.5* 2.4*  PROT 7.2 6.9 5.3*  ALBUMIN 3.5 3.4* 2.3*   Recent Labs  Lab 05/12/21 0053  LIPASE 54*   Recent Labs  Lab 05/12/21 0053 05/12/21 0522 05/13/21 0318  AMMONIA 72* 133* 38*    Coagulation Profile: Recent Labs  Lab 05/12/21 0522  INR 1.6*    Cardiac Enzymes: Recent Labs  Lab 05/12/21 0053 05/13/21 0318  CKTOTAL 8,313* 2,167*    BNP (last 3 results) No results for input(s): PROBNP in the last 8760 hours.  Lipid Profile: No results for input(s): CHOL, HDL, LDLCALC, TRIG, CHOLHDL, LDLDIRECT in the last 72 hours.  Thyroid Function Tests: No results for input(s): TSH, T4TOTAL, FREET4, T3FREE, THYROIDAB in the last 72 hours.  Anemia Panel: Recent Labs    05/12/21 0522  VITAMINB12 4,672*  FOLATE 24.8  FERRITIN 175  TIBC 318  IRON 18*  RETICCTPCT 4.1*    Urine analysis:    Component Value Date/Time   COLORURINE YELLOW 01/18/2019 [place] 01/18/2019 0435   LABSPEC >1.046 (H) 01/18/2019 0435   PHURINE 5.0 01/18/2019 0435   GLUCOSEU NEGATIVE 01/18/2019 0435   HGBUR NEGATIVE 01/18/2019 0435   BILIRUBINUR NEGATIVE 01/18/2019 0435   BILIRUBINUR 2+ 05/17/2015 1013   KETONESUR 5 (A) 01/18/2019 0435   PROTEINUR NEGATIVE 01/18/2019 0435   UROBILINOGEN 0.2 05/17/2015 1013   NITRITE NEGATIVE 01/18/2019 0435   LEUKOCYTESUR NEGATIVE 01/18/2019 0435    Sepsis Labs: Lactic Acid, Venous    Component Value Date/Time   LATICACIDVEN 1.4 05/13/2021 0318    MICROBIOLOGY: Recent Results (from the past 240 hour(s))  Resp Panel by RT-PCR (Flu A&B, Covid) Nasopharyngeal Swab     Status: None   Collection Time: 05/12/21  1:00 AM   Specimen: Nasopharyngeal Swab; Nasopharyngeal(NP) swabs in vial transport medium  Result Value Ref Range  Status   SARS Coronavirus 2 by RT PCR NEGATIVE NEGATIVE Final    Comment: (NOTE) SARS-CoV-2 target nucleic acids are NOT DETECTED.  The SARS-CoV-2 RNA is generally detectable in upper respiratory specimens during the acute phase of infection. The lowest concentration of SARS-CoV-2 viral copies this assay can detect is 138 copies/mL. A negative result does not preclude SARS-Cov-2 infection and should not be used as the sole basis for treatment or other patient management decisions. A negative result may occur with  improper specimen collection/handling, submission of specimen other than nasopharyngeal swab, presence of viral mutation(s)  within the areas targeted by this assay, and inadequate number of viral copies(<138 copies/mL). A negative result must be combined with clinical observations, patient history, and epidemiological information. The expected result is Negative.  Fact Sheet for Patients:  EntrepreneurPulse.com.au  Fact Sheet for Healthcare Providers:  IncredibleEmployment.be  This test is no t yet approved or cleared by the [place] FDA and  has been authorized for detection and/or diagnosis of SARS-CoV-2 by FDA under an Emergency Use Authorization (EUA). This EUA will remain  in effect (meaning this test can be used) for the duration of the COVID-19 declaration under Section 564(b)(1) of the Act, 21 U.S.C.section 360bbb-3(b)(1), unless the authorization is terminated  or revoked sooner.       Influenza A by PCR NEGATIVE NEGATIVE Final   Influenza B by PCR NEGATIVE NEGATIVE Final    Comment: (NOTE) The Xpert Xpress SARS-CoV-2/FLU/RSV plus assay is intended as an aid in the diagnosis of influenza from Nasopharyngeal swab specimens and should not be used as a sole basis for treatment. Nasal washings and aspirates are unacceptable for Xpert Xpress SARS-CoV-2/FLU/RSV testing.  Fact Sheet for  Patients: EntrepreneurPulse.com.au  Fact Sheet for Healthcare Providers: IncredibleEmployment.be  This test is not yet approved or cleared by the [place] FDA and has been authorized for detection and/or diagnosis of SARS-CoV-2 by FDA under an Emergency Use Authorization (EUA). This EUA will remain in effect (meaning this test can be used) for the duration of the COVID-19 declaration under Section 564(b)(1) of the Act, 21 U.S.C. section 360bbb-3(b)(1), unless the authorization is terminated or revoked.  Performed at [facility], [place] [address] [place], [place] [address]     RADIOLOGY STUDIES/RESULTS: DG Chest 2 View  Result Date: 05/12/2021 CLINICAL DATA:  Anxious. History of hypertension and diabetes mellitus. EXAM: CHEST - 2 VIEW COMPARISON:  01/18/2019. FINDINGS: The heart size and mediastinal contours are within normal limits. Both lungs are clear. No acute osseous abnormality. IMPRESSION: No active cardiopulmonary disease. Electronically Signed   By: Brett Fairy M.D.   On: 05/12/2021 00:15   DG Pelvis 1-2 Views  Result Date: 05/12/2021 CLINICAL DATA:  Fall today EXAM: PELVIS - 1-2 VIEW COMPARISON:  None. FINDINGS: There is no evidence of pelvic fracture or diastasis. No pelvic bone lesions are seen. IMPRESSION: Negative. Electronically Signed   By: Jorje Guild M.D.   On: 05/12/2021 07:06   CT Head Wo Contrast  Result Date: 05/12/2021 CLINICAL DATA:  Initial evaluation for acute altered mental status. EXAM: CT HEAD WITHOUT CONTRAST TECHNIQUE: Contiguous axial images were obtained from the base of the skull through the vertex without intravenous contrast. COMPARISON:  None available. FINDINGS: Brain: Diffuse prominence of the CSF containing spaces compatible with generalized cerebral atrophy, advanced for age. Patchy hypodensity involving the supratentorial cerebral white matter most consistent with chronic small  vessel ischemic disease. No evidence for acute intracranial hemorrhage. No findings to suggest acute large vessel territory infarct. No mass lesion, midline shift, or mass effect. Ventricles are normal in size without evidence for hydrocephalus. No extra-axial fluid collection identified. Vascular: No hyperdense vessel identified.Calcified atherosclerosis present at the skull base. Skull: Scalp soft tissues demonstrate no acute abnormality. Calvarium intact. Sinuses/Orbits: Globes and orbital soft tissues within normal limits. Visualized paranasal sinuses are clear. No mastoid effusion. IMPRESSION: 1. No acute intracranial abnormality. 2. Generalized cerebral atrophy with chronic microvascular ischemic disease, advanced for age. Electronically Signed   By: Jeannine Boga M.D.   On: 05/12/2021 00:17  DG Foot 2 Views Left  Result Date: 05/12/2021 CLINICAL DATA:  Swelling and sores. EXAM: LEFT FOOT - 2 VIEW COMPARISON:  None. FINDINGS: Study limited by overlying artifact from clothing. Within this limitation, there is no evidence of fracture or dislocation. No gross bony destruction evident to suggest osteomyelitis. IMPRESSION: Negative. Electronically Signed   By: Misty Stanley M.D.   On: 05/12/2021 06:50   DG Foot 2 Views Right  Result Date: 05/12/2021 CLINICAL DATA:  Swelling and sores. EXAM: RIGHT FOOT - 2 VIEW COMPARISON:  None. FINDINGS: Study limited by superimposition of the distal toes on both images. Overlying artifact from clothing evident. Within these limitations, no fracture or dislocation. No gross bony destruction to suggest osteomyelitis. IMPRESSION: Negative. Electronically Signed   By: Misty Stanley M.D.   On: 05/12/2021 06:49   CT RENAL STONE STUDY  Result Date: 05/12/2021 CLINICAL DATA:  Flank pain. EXAM: CT ABDOMEN AND PELVIS WITHOUT CONTRAST TECHNIQUE: Multidetector CT imaging of the abdomen and pelvis was performed following the standard protocol without IV contrast.  COMPARISON:  01/18/2019 FINDINGS: Lower chest: Small peripheral areas of ground-glass opacity are seen in the anterior right middle lobe (18/4) and anterior left upper lobe (image 5/series 4). Atelectasis noted posterior left base. No evidence for pleural effusion. Hepatobiliary: Nodular liver contour is compatible with cirrhosis. Gallbladder lumen shows high attenuation, likely sludge although stones not excluded. No intrahepatic or extrahepatic biliary dilation. Pancreas: No focal mass lesion. No dilatation of the main duct. No intraparenchymal cyst. No peripancreatic edema. Spleen: Splenic volume measures 532 cc with craniocaudal length of 13.6 cm, findings consistent with splenomegaly. Adrenals/Urinary Tract: No adrenal nodule or mass. Kidneys unremarkable. No evidence for hydroureter. Foley catheter decompresses the urinary bladder. Gas in the bladder lumen is compatible with the instrumentation. Stomach/Bowel: Stomach is unremarkable. No gastric wall thickening. No evidence of outlet obstruction. Duodenum is normally positioned as is the ligament of Treitz. No small bowel wall thickening. No small bowel dilatation. The terminal ileum is normal. The appendix is normal. No gross colonic mass. No colonic wall thickening. Vascular/Lymphatic: There is moderate atherosclerotic calcification of the abdominal aorta without aneurysm. There is no gastrohepatic or hepatoduodenal ligament lymphadenopathy. No retroperitoneal or mesenteric lymphadenopathy. No pelvic sidewall lymphadenopathy. Reproductive: Unremarkable. Other: No intraperitoneal free fluid. Musculoskeletal: No worrisome lytic or sclerotic osseous abnormality. IMPRESSION: 1. No acute findings in the abdomen or pelvis. Specifically, no findings to explain the patient's history of flank pain. 2. Cirrhotic changes in the liver with splenomegaly. 3. Small peripheral areas of ground-glass opacity in the right middle lobe and left upper lobe. Imaging features are  nonspecific but may be infectious/inflammatory. Follow-up chest CT in 3 months could be used to ensure resolution. 4. Aortic Atherosclerosis (ICD10-I70.0). Electronically Signed   By: Misty Stanley M.D.   On: 05/12/2021 06:57     LOS: 1 day   Oren Binet, MD  [facility]    To contact the attending provider between 7A-7P or the covering provider during after hours 7P-7A, please log into the web site [email or web address] and access using universal [place] password for that web site. If you do not have the password, please call the hospital operator.  05/13/2021, 12:30 PM

## 2021-05-13 NOTE — Procedures (Signed)
Interventional Radiology Procedure Note  Procedure: Temporary hemodialysis catheter placement  Findings: Please refer to procedural dictation for full description. 16 cm trialysis via right IJ.  Tip in right atrium.    Complications: None immediate  Estimated Blood Loss: < 5 mL  Recommendations: Catheter ready for immediate use.   Ruthann Cancer, MD

## 2021-05-13 NOTE — Progress Notes (Signed)
0250 - Pt arrived to unit from [facility]. Pt tremulous in all extremities while sleeping. Pt grimaces to pain but not responding to voice or following commands on arrival. VSS as charted. Skin assessment and CHG completed.  0400 - Pt remains drowsy and tremulous but is now responding to voice/pain and following commands. Pt Aox2 and c/o pain but unable to elaborate. VSS.

## 2021-05-13 NOTE — Progress Notes (Signed)
PT Cancellation Note  Patient Details Name: Martin Anthony MRN: [id number] DOB: Feb 24, 1966   Cancelled Treatment:    Reason Eval/Treat Not Completed: Medical issues which prohibited therapy  Patient lethargic with elevated BUN. Spoke with Dr. Sloan Leiter at bedside and agreed to cancel PT order at this time and will reorder as appropriate.    Arby Barrette, PT Acute Rehabilitation Services  Pager [phone] Office [phone]   Rexanne Mano 05/13/2021, 9:50 AM

## 2021-05-13 NOTE — Plan of Care (Signed)

## 2021-05-13 NOTE — Consult Note (Signed)
[place] Nurse Consult Note: Patient receiving care in [facility] [phone] Reason for Consult: Multiple wounds Wound type:  Lateral side of the left foot just  below the 5th metatarsophalangeal joint PI is unstageable 50% black 50% yellow surrounded by a faint circle of erythema. Measures 1.5 cm x 1.2 cm Left Lateral malleolus brown scabbed over measures 0.5 cm x 0.5 cm Right lateral malleolus is unstageable PI that is 100% dry eschar. Measures 3 cm x 2 cm Right posterior ankle is a full thickness wound that is 100% yellow and measures 1 cm x 2 cm Right lateral side of the foot is an unstageable PI 100% yellow measuring 0.5 cm x 1 cm Coccyx unstageable PI that is 100% yellow Right buttock full thickness wound 100% yellow measuring 3 cm x 1 cm Left Buttock full thickness wound 90% yellow 10% red measuring 4 cm x 3 cm The foot wounds were not dressed and had no drainage. The periwound of the buttocks is erythmatous r/t MASD/IAD Pressure Injury POA: Yes Dressing procedure/placement/frequency: Moisten Kerlix with Dakin's solution on 2 x 2s and place over the bilateral foot wounds, cover with foam dressing. Change the Dakins twice daily x 3 days, once this order has expired then change to [facility] on the same areas followed by a moistened saline gauze and foam dressing. Change this dressing twice daily.   Coccyx and buttock wounds: moisten 4 x 4s with Dakin's solution and place over the wounds x 3 days. Secure with sacral foam dressing upside down. Once this order has expired then change to [facility] on the same areas followed by moistened saline gauze, dry gauze and sacral foam dressing.  Gerhardts Butt Cream may be used in the erythematous MASD areas of the perineum and buttocks.  Low air loss mattress and Prevalon Heel lift boots have been ordered.  If patient gets up in chair then order a pressure redistribution chair pad Kellie Simmering # [phone])  Monitor the wound area(s) for worsening of condition such as: Signs/symptoms  of infection, increase in size, development of or worsening of odor, development of pain, or increased pain at the affected locations.   Notify the medical team if any of these develop.  Thank you for the consult. [place] nurse will not follow at this time.   Please re-consult the [place] team if needed.  Cathlean Marseilles Tamala Julian, MSN, RN, [place], Lysle Pearl, [facility] Wound Treatment Associate Pager [phone]

## 2021-05-13 NOTE — Progress Notes (Signed)
[facility] Progress Note  Subjective: no UOP recorded yest, and 250 cc UOP so far today. BP's 468- 032 systolic. BUN 164 and Creat 4.6.   Vitals:   05/13/21 0257 05/13/21 0400 05/13/21 0733 05/13/21 1154  BP: 107/67 110/76 (!) 108/54 107/60  Pulse: 76 77 76 71  Resp:  20 19 17   Temp:  (!) 97 F (36.1 C) (!) 97.2 F (36.2 C) (!) 97 F (36.1 C)  TempSrc:  Oral Axillary Axillary  SpO2: 99% 99% 98% (!) 89%    Exam: Gen lethargic, not responding to commands No jvd or bruits Chest clear bilat to bases, no rales/ wheezing RRR no MRG Abd soft ntnd no mass or ascites +bs GU normal male w/ foley in place Ext 1+ pedal edema, no wounds or ulcers Neuro obtunded        Home meds include - lasix 40 qd, nadolol 20 qd, zoloft 50, aldactone 100 qd         CT stone study noncon - cirrhosis, ^spleen, GG changes RML/ RLL, normal appearing kidneys bilat no hydro     CXR - clear     UNa <10,  UCr 163       UA - still pending       Assessment/ Plan: AKI - in pt w/ hx ETOH abuse and possibly decompensated cirrhosis. No ascites on exam. CT w/o renal obstruction. UA pending. Urine lytes c/w prerenal. Looked dry on initial exam. BP's low-normal. Possible ATN from rhabdo and/or vol depletion + hypotension. Got 4L bolus.  Marginal UOP, will hold IVF's for now (except D5W as below). Not waking up, we will have to dialyze. Consulting IR for temp cath.  Hypernatremia - Na down to 143, will lower D5W to 65 cc/hr Vol depletion - sp 4 L bolus altogether and also IVF"s.  Looks vol repleted, hold further IVF's for now w/ poor UOP.  ETOH abuse Cirrhosis/ splenomegaly Confusion - cannot distinguish uremia from other possible causes (delirium tremens, etc..).  Will plan for dialysis after access placement.            Rob Behr Cislo 05/13/2021, 12:58 PM   Recent Labs  Lab 05/12/21 0522 05/13/21 0318  K 4.6 3.6  BUN 181* 164*  CREATININE 4.84* 4.68*  CALCIUM 9.1 8.0*  HGB 9.4*  7.6*   Inpatient medications:  Chlorhexidine Gluconate Cloth  6 each Topical Daily   [START ON 05/16/2021] collagenase   Topical Daily   folic acid  1 mg Intravenous Daily   Gerhardt's butt cream   Topical Daily   lactulose  300 mL Rectal BID   methylPREDNISolone (SOLU-MEDROL) injection  30 mg Intravenous Daily   multivitamin with minerals  1 tablet Oral Daily   pantoprazole (PROTONIX) IV  40 mg Intravenous Q24H   sodium hypochlorite   Irrigation BID   thiamine  100 mg Oral Daily   Or   thiamine  100 mg Intravenous Daily    cefTRIAXone (ROCEPHIN)  IV 2 g (05/13/21 1042)   dextrose 125 mL/hr at 05/13/21 0600   LORazepam **OR** LORazepam

## 2021-05-14 LAB — URINALYSIS, MICROSCOPIC (REFLEX)
RBC / HPF: >50 RBC/hpf (ref 0–5)
Squamous Epithelial / HPF: NONE SEEN (ref 0–5)

## 2021-05-14 LAB — COMPREHENSIVE METABOLIC PANEL
ALT: 156 U/L — ABNORMAL HIGH (ref 0–44)
AST: 160 U/L — ABNORMAL HIGH (ref 15–41)
Albumin: 2.2 g/dL — ABNORMAL LOW (ref 3.5–5.0)
Alkaline Phosphatase: 94 U/L (ref 38–126)
Anion gap: 8 (ref 5–15)
BUN: 133 mg/dL — ABNORMAL HIGH (ref 6–20)
CO2: 18 mmol/L — ABNORMAL LOW (ref 22–32)
Calcium: 7.7 mg/dL — ABNORMAL LOW (ref 8.9–10.3)
Chloride: 116 mmol/L — ABNORMAL HIGH (ref 98–111)
Creatinine, Ser: 3.66 mg/dL — ABNORMAL HIGH (ref 0.61–1.24)
GFR, Estimated: 19 mL/min — ABNORMAL LOW (ref >60)
Glucose, Bld: 265 mg/dL — ABNORMAL HIGH (ref 70–99)
Potassium: 3.7 mmol/L (ref 3.5–5.1)
Sodium: 142 mmol/L (ref 135–145)
Total Bilirubin: 2.1 mg/dL — ABNORMAL HIGH (ref 0.3–1.2)
Total Protein: 5.2 g/dL — ABNORMAL LOW (ref 6.5–8.1)

## 2021-05-14 LAB — URINALYSIS, ROUTINE W REFLEX MICROSCOPIC
Bilirubin Urine: NEGATIVE
Glucose, UA: NEGATIVE mg/dL
Ketones, ur: NEGATIVE mg/dL
Leukocytes,Ua: NEGATIVE
Nitrite: NEGATIVE
Protein, ur: NEGATIVE mg/dL
Specific Gravity, Urine: 1.02 (ref 1.005–1.030)
pH: 5.5 (ref 5.0–8.0)

## 2021-05-14 LAB — CBC
HCT: 23 % — ABNORMAL LOW (ref 39.0–52.0)
Hemoglobin: 7.4 g/dL — ABNORMAL LOW (ref 13.0–17.0)
MCH: 32.5 pg (ref 26.0–34.0)
MCHC: 32.2 g/dL (ref 30.0–36.0)
MCV: 100.9 fL — ABNORMAL HIGH (ref 80.0–100.0)
Platelets: 143 10*3/uL — ABNORMAL LOW (ref 150–400)
RBC: 2.28 MIL/uL — ABNORMAL LOW (ref 4.22–5.81)
RDW: 14.3 % (ref 11.5–15.5)
WBC: 5.6 10*3/uL (ref 4.0–10.5)
nRBC: 0 % (ref 0.0–0.2)

## 2021-05-14 LAB — RAPID URINE DRUG SCREEN, HOSP PERFORMED
Amphetamines: NOT DETECTED
Barbiturates: NOT DETECTED
Benzodiazepines: POSITIVE — AB
Cocaine: NOT DETECTED
Opiates: NOT DETECTED
Tetrahydrocannabinol: NOT DETECTED

## 2021-05-14 LAB — AMMONIA: Ammonia: 35 umol/L (ref 9–35)

## 2021-05-14 MED ORDER — HEPARIN SODIUM (PORCINE) 1000 UNIT/ML IJ SOLN
INTRAMUSCULAR | Status: AC
  Administered 2021-05-14: 1000 [IU]
  Filled 2021-05-14: qty 3

## 2021-05-14 MED ORDER — CHLORHEXIDINE GLUCONATE CLOTH 2 % EX PADS
6.0000 | MEDICATED_PAD | Freq: Every day | CUTANEOUS | Status: DC
  Administered 2021-05-15 – 2021-05-18 (×4): 6 via TOPICAL

## 2021-05-14 NOTE — Progress Notes (Signed)
PROGRESS NOTE        PATIENT DETAILS Name: Martin Anthony Age: 55 y.o. Sex: male Date of Birth: 1965-08-29 Admit Date: 05/11/2021 Admitting Physician Rise Patience, MD PCP:Fry, Ishmael Holter, MD  Brief Narrative: Patient is a 55 y.o. male with history of liver cirrhosis, EtOH use, PAF-who presented with confusion/found down at home (found by ex wife)-patient was found to have acute metabolic encephalopathy in the setting of AKI/hepatic encephalopathy.  Initially monitored at [facility] transferred to [facility] for potential dialysis.  See below for details.  Subjective: Seen earlier during hemodialysis-still very confused-[place] he was agitated and required IV Ativan.  Although lethargic/confused-he will awake-opens eyes with loud verbal stimuli or gentle sternal rub.  Objective: Vitals: Blood pressure 109/61, pulse 70, temperature 97.8 F (36.6 C), temperature source Oral, resp. rate 13, weight 54 kg, SpO2 100 %.   Exam: Gen Exam: Confused/lethargic. HEENT:atraumatic, normocephalic Chest: B/L clear to auscultation anteriorly CVS:S1S2 regular Abdomen:soft non tender, non distended Extremities:no edema Neurology: Difficult exam-moves all 4 extremities. Skin: no rash   Pertinent Labs/Radiology: Recent Labs  Lab 05/12/21 0053 05/12/21 0522 05/13/21 0318 05/14/21 0209  WBC 13.1* 11.9* 7.9 5.6  HGB 9.8* 9.4* 7.6* 7.4*  PLT 309 257 201 143*  NA 144 146* 143 142  K 5.1 4.6 3.6 3.7  CREATININE 4.56* 4.84* 4.68* 3.66*  AST 479* 402* 244* 160*  ALT 231* 217* 175* 156*  ALKPHOS 100 95 88 94  BILITOT 5.3* 4.5* 2.4* 2.1*     Assessment/Plan: Acute metabolic encephalopathy: Multifactorial-due to uremic encephalopathy, hepatic encephalopathy, possible alcohol withdrawal symptoms.  CT head without any acute abnormalities.  Ammonia levels have normalized-on Ativan for withdrawal symptoms-being started on HD today for uremia.  Follow clinical course-continue to  treat underlying etiologies-hopefully he will improve over the next few days.  AKI: Suspicion for hemodynamically mediated kidney injury-no hydronephrosis seen on CT imaging.  Nephrology following-BUN still remains significantly elevated although renal function somewhat better.  HD catheter placed by IR on 11/29-getting his first HD today.  Will await further recommendations from nephrology-continue to monitor for renal recovery.  Alcoholic hepatitis: Transaminitis improving-acute hepatitis serology was negative-discriminant function around 36-on IV steroids as this will not allow any oral intake at this point.  Follow closely.   Normocytic anemia: Due to acute illness-AKI-no overt signs of GI bleeding/blood loss-plan to transfuse if hb< 7  Rhabdomyolysis: Due to being down on the floor prior to this hospitalization-CK levels improving.  Liver cirrhosis/portal hypertension/esophageal varices: Continue supportive care-hold diuretics given AKI-beta-blocker remains on hold until patient's mentation improves and is able to take oral agents.  Hepatic encephalopathy: Ammonia levels have normalized-continue lactulose enema till mental status allows oral intake.    Alcohol withdrawal EtOH use/alcohol withdrawal: On Ativan per CIWA protocol-confused-agitated at times per RN-continue supportive care.    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   Depression: Resume Zoloft when oral intake resumes-when mentation improves.  BMI Estimated body mass index is 14.88 kg/m as calculated from the following:   Height as of 07/03/20: _0  (1.905 m).   Weight as of this encounter: 54 kg.   Wound care Pressure Injury 05/13/21 Ankle Right;Lateral  Unstageable - Full  thickness tissue loss in which the base of the injury is covered by slough (yellow, tan, gray, green or brown) and/or eschar (tan, brown or black) in the wound bed. (Active)  05/13/21 0400  Location: Ankle  Location Orientation: Right;Lateral  Staging: Unstageable - Full thickness tissue loss in which the base of the injury is covered by slough (yellow, tan, gray, green or brown) and/or eschar (tan, brown or black) in the wound bed.  Wound Description (Comments):   Present on Admission: Yes     Pressure Injury 05/13/21 Ankle Left;Lateral Unstageable - Full thickness tissue loss in which the base of the injury is covered by slough (yellow, tan, gray, green or brown) and/or eschar (tan, brown or black) in the wound bed. (Active)  05/13/21 0400  Location: Ankle  Location Orientation: Left;Lateral  Staging: Unstageable - Full thickness tissue loss in which the base of the injury is covered by slough (yellow, tan, gray, green or brown) and/or eschar (tan, brown or black) in the wound bed.  Wound Description (Comments):   Present on Admission: Yes     Pressure Injury 05/13/21 Coccyx Unstageable - Full thickness tissue loss in which the base of the injury is covered by slough (yellow, tan, gray, green or brown) and/or eschar (tan, brown or black) in the wound bed. (Active)  05/13/21 0400  Location: Coccyx  Location Orientation:   Staging: Unstageable - Full thickness tissue loss in which the base of the injury is covered by slough (yellow, tan, gray, green or brown) and/or eschar (tan, brown or black) in the wound bed.  Wound Description (Comments):   Present on Admission: Yes     Pressure Injury 05/13/21 Buttocks Left;Lower (Active)  05/13/21 0400  Location: Buttocks  Location Orientation: Left;Lower  Staging:   Wound Description (Comments):   Present on Admission: Yes     Pressure Injury 05/13/21 Buttocks Right (Active)  05/13/21 0400  Location: Buttocks  Location Orientation: Right   Staging:   Wound Description (Comments):   Present on Admission: Yes      Procedures: 11/29>> temporary HD catheter placed by IR. Consults: Renal DVT Prophylaxis: SCD Code Status:Full code  Family Communication: Ex wife-[name] Snodgrass-[phone] on 11/30 over the phone  Time spent: 35 minutes-Greater than 50% of this time was spent in counseling, explanation of diagnosis, planning of further management, and coordination of care.   Disposition Plan: Status is: Inpatient  Remains inpatient appropriate because: AKI/encephalopathy   Diet: Diet Order             Diet NPO time specified  Diet effective now                     Antimicrobial agents: Anti-infectives (From admission, onward)    Start     Dose/Rate Route Frequency Ordered Stop   05/12/21 0715  cefTRIAXone (ROCEPHIN) 2 g in sodium chloride 0.9 % 100 mL IVPB        2 g 200 mL/hr over 30 Minutes Intravenous Every 24 hours 05/12/21 0701          MEDICATIONS: Scheduled Meds:  Chlorhexidine Gluconate Cloth  6 each Topical Daily   Chlorhexidine Gluconate Cloth  6 each Topical Q0600   [START ON 05/16/2021] collagenase   Topical Daily   folic acid  1 mg Intravenous Daily   Gerhardt's butt cream   Topical Daily   lactulose  300 mL Rectal BID   methylPREDNISolone (SOLU-MEDROL) injection  30 mg Intravenous Daily  multivitamin with minerals  1 tablet Oral Daily   pantoprazole (PROTONIX) IV  40 mg Intravenous Q24H   sodium hypochlorite   Irrigation BID   thiamine  100 mg Oral Daily   Or   thiamine  100 mg Intravenous Daily   Continuous Infusions:  cefTRIAXone (ROCEPHIN)  IV 2 g (05/14/21 0806)   dextrose 65 mL/hr at 05/14/21 0803   PRN Meds:.lidocaine-EPINEPHrine, LORazepam **OR** LORazepam   I have personally reviewed following labs and imaging studies  LABORATORY DATA: CBC: Recent Labs  Lab 05/12/21 0053 05/12/21 0522 05/13/21 0318 05/14/21 0209  WBC 13.1* 11.9* 7.9 5.6  NEUTROABS 10.8*  9.5*  --   --   HGB 9.8* 9.4* 7.6* 7.4*  HCT 29.6* 28.1* 23.4* 23.0*  MCV 96.4 95.9 95.5 100.9*  PLT 309 257 201 143*     Basic Metabolic Panel: Recent Labs  Lab 05/12/21 0053 05/12/21 0522 05/13/21 0318 05/14/21 0209  NA 144 146* 143 142  K 5.1 4.6 3.6 3.7  CL 112* 113* 115* 116*  CO2 13* 13* 18* 18*  GLUCOSE 198* 174* 209* 265*  BUN 174* 181* 164* 133*  CREATININE 4.56* 4.84* 4.68* 3.66*  CALCIUM 9.0 9.1 8.0* 7.7*     GFR: CrCl cannot be calculated (Unknown ideal weight.).  Liver Function Tests: Recent Labs  Lab 05/12/21 0053 05/12/21 0522 05/13/21 0318 05/14/21 0209  AST 479* 402* 244* 160*  ALT 231* 217* 175* 156*  ALKPHOS 100 95 88 94  BILITOT 5.3* 4.5* 2.4* 2.1*  PROT 7.2 6.9 5.3* 5.2*  ALBUMIN 3.5 3.4* 2.3* 2.2*    Recent Labs  Lab 05/12/21 0053  LIPASE 54*    Recent Labs  Lab 05/12/21 0053 05/12/21 0522 05/13/21 0318 05/14/21 0209  AMMONIA 72* 133* 38* 35     Coagulation Profile: Recent Labs  Lab 05/12/21 0522  INR 1.6*     Cardiac Enzymes: Recent Labs  Lab 05/12/21 0053 05/13/21 0318  CKTOTAL 8,313* 2,167*     BNP (last 3 results) No results for input(s): PROBNP in the last 8760 hours.  Lipid Profile: No results for input(s): CHOL, HDL, LDLCALC, TRIG, CHOLHDL, LDLDIRECT in the last 72 hours.  Thyroid Function Tests: No results for input(s): TSH, T4TOTAL, FREET4, T3FREE, THYROIDAB in the last 72 hours.  Anemia Panel: Recent Labs    05/12/21 0522  VITAMINB12 4,672*  FOLATE 24.8  FERRITIN 175  TIBC 318  IRON 18*  RETICCTPCT 4.1*     Urine analysis:    Component Value Date/Time   COLORURINE YELLOW 01/18/2019 0435   APPEARANCEUR CLEAR 01/18/2019 0435   LABSPEC >1.046 (H) 01/18/2019 0435   PHURINE 5.0 01/18/2019 0435   GLUCOSEU NEGATIVE 01/18/2019 0435   HGBUR NEGATIVE 01/18/2019 0435   BILIRUBINUR NEGATIVE 01/18/2019 0435   BILIRUBINUR 2+ 05/17/2015 1013   KETONESUR 5 (A) 01/18/2019 0435   PROTEINUR  NEGATIVE 01/18/2019 0435   UROBILINOGEN 0.2 05/17/2015 1013   NITRITE NEGATIVE 01/18/2019 0435   LEUKOCYTESUR NEGATIVE 01/18/2019 0435    Sepsis Labs: Lactic Acid, Venous    Component Value Date/Time   LATICACIDVEN 1.4 05/13/2021 0318    MICROBIOLOGY: Recent Results (from the past 240 hour(s))  Resp Panel by RT-PCR (Flu A&B, Covid) Nasopharyngeal Swab     Status: None   Collection Time: 05/12/21  1:00 AM   Specimen: Nasopharyngeal Swab; Nasopharyngeal(NP) swabs in vial transport medium  Result Value Ref Range Status   SARS Coronavirus 2 by RT PCR NEGATIVE NEGATIVE Final    Comment: (  NOTE) SARS-CoV-2 target nucleic acids are NOT DETECTED.  The SARS-CoV-2 RNA is generally detectable in upper respiratory specimens during the acute phase of infection. The lowest concentration of SARS-CoV-2 viral copies this assay can detect is 138 copies/mL. A negative result does not preclude SARS-Cov-2 infection and should not be used as the sole basis for treatment or other patient management decisions. A negative result may occur with  improper specimen collection/handling, submission of specimen other than nasopharyngeal swab, presence of viral mutation(s) within the areas targeted by this assay, and inadequate number of viral copies(<138 copies/mL). A negative result must be combined with clinical observations, patient history, and epidemiological information. The expected result is Negative.  Fact Sheet for Patients:  EntrepreneurPulse.com.au  Fact Sheet for Healthcare Providers:  IncredibleEmployment.be  This test is no t yet approved or cleared by the [place] FDA and  has been authorized for detection and/or diagnosis of SARS-CoV-2 by FDA under an Emergency Use Authorization (EUA). This EUA will remain  in effect (meaning this test can be used) for the duration of the COVID-19 declaration under Section 564(b)(1) of the Act, 21 U.S.C.section  360bbb-3(b)(1), unless the authorization is terminated  or revoked sooner.       Influenza A by PCR NEGATIVE NEGATIVE Final   Influenza B by PCR NEGATIVE NEGATIVE Final    Comment: (NOTE) The Xpert Xpress SARS-CoV-2/FLU/RSV plus assay is intended as an aid in the diagnosis of influenza from Nasopharyngeal swab specimens and should not be used as a sole basis for treatment. Nasal washings and aspirates are unacceptable for Xpert Xpress SARS-CoV-2/FLU/RSV testing.  Fact Sheet for Patients: EntrepreneurPulse.com.au  Fact Sheet for Healthcare Providers: IncredibleEmployment.be  This test is not yet approved or cleared by the [place] FDA and has been authorized for detection and/or diagnosis of SARS-CoV-2 by FDA under an Emergency Use Authorization (EUA). This EUA will remain in effect (meaning this test can be used) for the duration of the COVID-19 declaration under Section 564(b)(1) of the Act, 21 U.S.C. section 360bbb-3(b)(1), unless the authorization is terminated or revoked.  Performed at [facility], [place] [address] [place], [place] [address]     RADIOLOGY STUDIES/RESULTS: IR Fluoro Guide CV Line Right  Result Date: 05/13/2021 INDICATION: 55 year old male presenting in acute renal failure requiring access for hemodialysis. EXAM: NON-TUNNELED CENTRAL VENOUS HEMODIALYSIS CATHETER PLACEMENT WITH ULTRASOUND AND FLUOROSCOPIC GUIDANCE COMPARISON:  None. MEDICATIONS: None FLUOROSCOPY TIME:  0 minutes, 18 seconds (3.7 mGy) COMPLICATIONS: None immediate. PROCEDURE: Informed written consent was obtained from the patient after a discussion of the risks, benefits, and alternatives to treatment. Questions regarding the procedure were encouraged and answered. The right neck and chest were prepped with chlorhexidine in a sterile fashion, and a sterile drape was applied covering the operative field. Maximum barrier sterile technique  with sterile gowns and gloves were used for the procedure. A timeout was performed prior to the initiation of the procedure. After the overlying soft tissues were anesthetized, a small venotomy incision was created and a micropuncture kit was utilized to access the internal jugular vein. Real-time ultrasound guidance was utilized for vascular access including the acquisition of a permanent ultrasound image documenting patency of the accessed vessel. The microwire was utilized to measure appropriate catheter length. A stiff glidewire was advanced to the level of the IVC. Under fluoroscopic guidance, the venotomy was serially dilated, ultimately allowing placement of a 16 cm temporary Trialysis catheter with tip ultimately terminating within the superior aspect of the right atrium.  Final catheter positioning was confirmed and documented with a spot radiographic image. The catheter aspirates and flushes normally. The catheter was flushed with appropriate volume heparin dwells. The catheter exit site was secured with a 0-Silk retention suture. A dressing was placed. The patient tolerated the procedure well without immediate post procedural complication. IMPRESSION: Successful placement of a right internal jugular approach 16 cm temporary dialysis catheter with tip terminating with in the superior aspect of the right atrium. The catheter is ready for immediate use. PLAN: This catheter may be converted to a tunneled dialysis catheter at a later date as indicated. Ruthann Cancer, MD [facility] Electronically Signed   By: Ruthann Cancer M.D.   On: 05/13/2021 14:13   IR US Guide Vasc Access Right  Result Date: 05/13/2021 INDICATION: 55 year old male presenting in acute renal failure requiring access for hemodialysis. EXAM: NON-TUNNELED CENTRAL VENOUS HEMODIALYSIS CATHETER PLACEMENT WITH ULTRASOUND AND FLUOROSCOPIC GUIDANCE COMPARISON:  None. MEDICATIONS: None  FLUOROSCOPY TIME:  0 minutes, 18 seconds (3.7 mGy) COMPLICATIONS: None immediate. PROCEDURE: Informed written consent was obtained from the patient after a discussion of the risks, benefits, and alternatives to treatment. Questions regarding the procedure were encouraged and answered. The right neck and chest were prepped with chlorhexidine in a sterile fashion, and a sterile drape was applied covering the operative field. Maximum barrier sterile technique with sterile gowns and gloves were used for the procedure. A timeout was performed prior to the initiation of the procedure. After the overlying soft tissues were anesthetized, a small venotomy incision was created and a micropuncture kit was utilized to access the internal jugular vein. Real-time ultrasound guidance was utilized for vascular access including the acquisition of a permanent ultrasound image documenting patency of the accessed vessel. The microwire was utilized to measure appropriate catheter length. A stiff glidewire was advanced to the level of the IVC. Under fluoroscopic guidance, the venotomy was serially dilated, ultimately allowing placement of a 16 cm temporary Trialysis catheter with tip ultimately terminating within the superior aspect of the right atrium. Final catheter positioning was confirmed and documented with a spot radiographic image. The catheter aspirates and flushes normally. The catheter was flushed with appropriate volume heparin dwells. The catheter exit site was secured with a 0-Silk retention suture. A dressing was placed. The patient tolerated the procedure well without immediate post procedural complication. IMPRESSION: Successful placement of a right internal jugular approach 16 cm temporary dialysis catheter with tip terminating with in the superior aspect of the right atrium. The catheter is ready for immediate use. PLAN: This catheter may be converted to a tunneled dialysis catheter at a later date as indicated. Ruthann Cancer, MD [facility] Electronically Signed   By: Ruthann Cancer M.D.   On: 05/13/2021 14:13     LOS: 2 days   Oren Binet, MD  [facility]    To contact the attending provider between 7A-7P or the covering provider during after hours 7P-7A, please log into the web site [email or web address] and access using universal [place] password for that web site. If you do not have the password, please call the hospital operator.  05/14/2021, 10:23 AM

## 2021-05-14 NOTE — Progress Notes (Signed)
CSW received request to fax letter stating patient is hospitalized and unable to attend court date. CSW faxed letter to patient's lawyer Alvera Singh) at f. [phone].  Gilmore Laroche, MSW, [facility]

## 2021-05-14 NOTE — Progress Notes (Signed)
[facility] Progress Note  Subjective: 3300 cc UOP yesterday, which is a big change. Pt on HD.   Vitals:   05/14/21 0922 05/14/21 1000 05/14/21 1030 05/14/21 1100  BP: 126/65 109/61 113/65 113/67  Pulse: 73 70 71 70  Resp: 12 13 12 12   Temp:      TempSrc:      SpO2:      Weight:        Exam: Gen remains lethargic, opens eyes briefly to loud voice and shaking No jvd or bruits Chest clear bilat to bases, no rales/ wheezing RRR no MRG Abd soft ntnd no mass or ascites +bs GU normal male w/ foley in place Ext 1+ pedal edema Neuro remains poorly responsive        Home meds include - lasix 40 qd, nadolol 20 qd, zoloft 50, aldactone 100 qd         CT stone study noncon - cirrhosis, ^spleen, GG changes RML/ RLL, normal appearing kidneys bilat no hydro     CXR 11/28 - clear     UNa <10,  UCr 163       UA - still pending       Assessment/ Plan: AKI - in pt w/ hx ETOH abuse and possibly decompensated cirrhosis. No ascites on exam. CT w/o renal obstruction. UA pending. Urine lytes c/w prerenal. Looked dry on initial exam. BP's were low-normal. Suspect possible ATN from rhabdo and/or vol depletion + hypotension. Got 4L bolus 1st day then IVF's. Looks vol replete now. UOP has opened up w/ 3300 cc out yesterday. Getting acute HD 1st Rx this am. Will hold on further HD w/ new UOP, hopefully is recovering renal function.  Hypernatremia - resolving, Na down to 142, ^D5W to 85 cc/hr Vol depletion - looks vol replete ETOH abuse Cirrhosis/ splenomegaly Confusion - HD x 1 today, then follow MS and labs           Rob Davan Nawabi 05/14/2021, 11:35 AM   Recent Labs  Lab 05/13/21 0318 05/14/21 0209  K 3.6 3.7  BUN 164* 133*  CREATININE 4.68* 3.66*  CALCIUM 8.0* 7.7*  HGB 7.6* 7.4*    Inpatient medications:  Chlorhexidine Gluconate Cloth  6 each Topical Daily   Chlorhexidine Gluconate Cloth  6 each Topical Q0600   [START ON 05/16/2021] collagenase   Topical Daily    folic acid  1 mg Intravenous Daily   Gerhardt's butt cream   Topical Daily   heparin sodium (porcine)       lactulose  300 mL Rectal BID   methylPREDNISolone (SOLU-MEDROL) injection  30 mg Intravenous Daily   multivitamin with minerals  1 tablet Oral Daily   pantoprazole (PROTONIX) IV  40 mg Intravenous Q24H   sodium hypochlorite   Irrigation BID   thiamine  100 mg Oral Daily   Or   thiamine  100 mg Intravenous Daily    cefTRIAXone (ROCEPHIN)  IV 2 g (05/14/21 0806)   dextrose 65 mL/hr at 05/14/21 0803   lidocaine-EPINEPHrine, LORazepam **OR** LORazepam

## 2021-05-15 LAB — CBC
HCT: 22.3 % — ABNORMAL LOW (ref 39.0–52.0)
Hemoglobin: 7.5 g/dL — ABNORMAL LOW (ref 13.0–17.0)
MCH: 31.9 pg (ref 26.0–34.0)
MCHC: 33.6 g/dL (ref 30.0–36.0)
MCV: 94.9 fL (ref 80.0–100.0)
Platelets: 131 10*3/uL — ABNORMAL LOW (ref 150–400)
RBC: 2.35 MIL/uL — ABNORMAL LOW (ref 4.22–5.81)
RDW: 13.6 % (ref 11.5–15.5)
WBC: 6.3 10*3/uL (ref 4.0–10.5)
nRBC: 0 % (ref 0.0–0.2)

## 2021-05-15 LAB — COMPREHENSIVE METABOLIC PANEL
ALT: 132 U/L — ABNORMAL HIGH (ref 0–44)
AST: 98 U/L — ABNORMAL HIGH (ref 15–41)
Albumin: 2.2 g/dL — ABNORMAL LOW (ref 3.5–5.0)
Alkaline Phosphatase: 80 U/L (ref 38–126)
Anion gap: 10 (ref 5–15)
BUN: 69 mg/dL — ABNORMAL HIGH (ref 6–20)
CO2: 26 mmol/L (ref 22–32)
Calcium: 7.9 mg/dL — ABNORMAL LOW (ref 8.9–10.3)
Chloride: 101 mmol/L (ref 98–111)
Creatinine, Ser: 2.41 mg/dL — ABNORMAL HIGH (ref 0.61–1.24)
GFR, Estimated: 31 mL/min — ABNORMAL LOW (ref >60)
Glucose, Bld: 208 mg/dL — ABNORMAL HIGH (ref 70–99)
Potassium: 3.5 mmol/L (ref 3.5–5.1)
Sodium: 137 mmol/L (ref 135–145)
Total Bilirubin: 1.6 mg/dL — ABNORMAL HIGH (ref 0.3–1.2)
Total Protein: 5.2 g/dL — ABNORMAL LOW (ref 6.5–8.1)

## 2021-05-15 LAB — VITAMIN B1: Vitamin B1 (Thiamine): 130.3 nmol/L (ref 66.5–200.0)

## 2021-05-15 MED ORDER — DEXTROSE-NACL 5-0.2 % IV SOLN: INTRAVENOUS | Status: DC

## 2021-05-15 NOTE — Progress Notes (Signed)
Patient has pulled out IV lines, had to admin 2mg  of Ativan. Also asked lab to come back at 7am due to patient agitation.Will continue to monitor.

## 2021-05-15 NOTE — Progress Notes (Signed)
Notified by RN that pt is agitated and confused and is pulling at his lines. He has pulled out his peripheral IV. Has ARF and has dialysis catheter in place. Concerned he will pull out the dialysis catheter.  Has used Mittens but were ineffective  Soft wrist restraints ordered to prevent him from pulling out his dialysis catheter and other lines.

## 2021-05-15 NOTE — Progress Notes (Signed)
[facility] Progress Note  Subjective: 37 UOP yesterday, today nothing recorded yet. Pt is much more alert   Vitals:   05/14/21 2000 05/15/21 0400 05/15/21 0445 05/15/21 0757  BP: 110/60 109/63  109/82  Pulse: 68   74  Resp: 18   18  Temp: 97.7 F (36.5 C) 98 F (36.7 C)    TempSrc: Axillary Axillary    SpO2: 100% 100%    Weight:   76 kg     Exam: Gen is awake and responsive, asking questions No jvd or bruits Chest clear bilat to bases, no rales/ wheezing RRR no MRG Abd soft ntnd no mass or ascites +bs GU normal male w/ foley in place Ext 1+ pedal edema Neuro - awake and nonfocal exam        Home meds include - lasix 40 qd, nadolol 20 qd, zoloft 50, aldactone 100 qd         CT stone study noncon - cirrhosis, ^spleen, GG changes RML/ RLL, normal appearing kidneys bilat no hydro     CXR 11/28 - clear     UNa <10,  UCr 163       UA - still pending       Assessment/ Plan: AKI - in pt w/ hx ETOH abuse and possibly decompensated cirrhosis. No ascites on exam. CT w/o renal obstruction. UA pending. Urine lytes c/w prerenal. Looked dry on initial exam. BP's were low-normal. Suspect ATN from rhabdo and/or vol depletion + hypotension. Got 4L bolus 1st day then further IVF's. Looks vol replete now. Got 1 HD on 11/30 for persistent AMS. With HD and possibly recovering renal function, B/Cr are down and pt is much more alert. Will resume IVF's at D5 1/4 NS at 75 cc/hr. Hold HD for now as kidneys appear to be recovering. Will follow.  Hypernatremia - resolved, change IVF"s to D5 1/4 NS Vol depletion - looks vol replete ETOH abuse Cirrhosis/ splenomegaly Confusion - HD x 1 on 11/30, today pt is wide awake, much better           Rob Wylene Weissman 05/15/2021, 2:27 PM   Recent Labs  Lab 05/14/21 0209 05/15/21 0711  K 3.7 3.5  BUN 133* 69*  CREATININE 3.66* 2.41*  CALCIUM 7.7* 7.9*  HGB 7.4* 7.5*    Inpatient medications:  Chlorhexidine Gluconate Cloth  6  each Topical Q0600   Chlorhexidine Gluconate Cloth  6 each Topical Q0600   [START ON 05/16/2021] collagenase   Topical Daily   folic acid  1 mg Intravenous Daily   Gerhardt's butt cream   Topical Daily   lactulose  300 mL Rectal BID   methylPREDNISolone (SOLU-MEDROL) injection  30 mg Intravenous Daily   multivitamin with minerals  1 tablet Oral Daily   pantoprazole (PROTONIX) IV  40 mg Intravenous Q24H   sodium hypochlorite   Irrigation BID   thiamine  100 mg Oral Daily   Or   thiamine  100 mg Intravenous Daily    cefTRIAXone (ROCEPHIN)  IV 2 g (05/15/21 0621)   dextrose 85 mL/hr at 05/14/21 2240   lidocaine-EPINEPHrine

## 2021-05-15 NOTE — Progress Notes (Addendum)
PROGRESS NOTE        PATIENT DETAILS Name: Martin Anthony Age: 55 y.o. Sex: male Date of Birth: 05-20-1966 Admit Date: 05/11/2021 Admitting Physician Rise Patience, MD PCP:Fry, Ishmael Holter, MD  Brief Narrative: Patient is a 55 y.o. male with history of liver cirrhosis, EtOH use, PAF-who presented with confusion/found down at home (found by ex wife)-patient was found to have acute metabolic encephalopathy in the setting of AKI/hepatic encephalopathy.  Initially monitored at [facility] transferred to [facility] for potential dialysis.  See below for details.  Subjective: Significantly more awake and alert-able to answer simple questions.  Still agitated at times requiring IV ativan.  Objective: Vitals: Blood pressure 109/82, pulse 74, temperature 98 F (36.7 C), temperature source Axillary, resp. rate 18, weight 76 kg, SpO2 100 %.   Exam: Gen Exam: Significantly more awake and alert today. HEENT:atraumatic, normocephalic Chest: B/L clear to auscultation anteriorly CVS:S1S2 regular Abdomen:soft non tender, non distended Extremities:no edema Neurology: Non focal Skin: no rash   Pertinent Labs/Radiology: Recent Labs  Lab 05/12/21 0053 05/12/21 0522 05/13/21 0318 05/14/21 0209 05/15/21 0711  WBC 13.1* 11.9* 7.9 5.6 6.3  HGB 9.8* 9.4* 7.6* 7.4* 7.5*  PLT 309 257 201 143* 131*  NA 144 146* 143 142 137  K 5.1 4.6 3.6 3.7 3.5  CREATININE 4.56* 4.84* 4.68* 3.66* 2.41*  AST 479* 402* 244* 160* 98*  ALT 231* 217* 175* 156* 132*  ALKPHOS 100 95 88 94 80  BILITOT 5.3* 4.5* 2.4* 2.1* 1.6*     Assessment/Plan: Acute metabolic encephalopathy: Multifactorial-due to uremic encephalopathy, hepatic encephalopathy, possible alcohol withdrawal symptoms.  CT head without any acute abnormalities.  Mental status has markedly improved with treatment of underlying etiologies-continue lactulose/Ativan per CIWA protocol-and hemodialysis per nephrology.  AKI: Suspicion for  hemodynamically mediated kidney injury-no hydronephrosis seen on CT imaging.  No proteinuria on UA.  Renal function seems to be improving-electrolytes stable-significant urine output overnight.  Did get started on HD on 11/30-watch closely for signs of renal recovery   Alcoholic hepatitis: Transaminitis improving-acute hepatitis serology was negative-discriminant function around 36-once mental status improves further-we will switch from IV steroids to oral prednisolone   Normocytic anemia: Due to acute illness-AKI-no overt signs of GI bleeding/blood loss-plan to transfuse if hb< 7  Rhabdomyolysis: Due to being down on the floor prior to this hospitalization-CK levels improving.  Liver cirrhosis/portal hypertension/esophageal varices: Continue supportive care-hold diuretics given AKI-resume beta-blocker when mental status improves further.    Hepatic encephalopathy: Mental status significantly better-continue lactulose enema until mental status is persistently better and is able to tolerate oral intake consistently.    Alcohol withdrawal EtOH use/alcohol withdrawal: Significant improvement in mental status overnight-no tremors-continues to have spells where he is agitated and confused per RN-continue Ativan per CIWA protocol.    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   Depression: Resume Zoloft when oral intake resumes-when mentation improves.  BMI Estimated body mass index is 20.94 kg/m as calculated from the following:   Height as of 07/03/20: '6\' 3"'  (1.905 m).   Weight as of this encounter: 76 kg.   Wound care Pressure Injury 05/13/21 Ankle Right;Lateral Unstageable - Full thickness tissue  loss in which the base of the injury is covered by  slough (yellow, tan, gray, green or brown) and/or eschar (tan, brown or black) in the wound bed. (Active)  05/13/21 0400  Location: Ankle  Location Orientation: Right;Lateral  Staging: Unstageable - Full thickness tissue loss in which the base of the injury is covered by slough (yellow, tan, gray, green or brown) and/or eschar (tan, brown or black) in the wound bed.  Wound Description (Comments):   Present on Admission: Yes     Pressure Injury 05/13/21 Ankle Left;Lateral Unstageable - Full thickness tissue loss in which the base of the injury is covered by slough (yellow, tan, gray, green or brown) and/or eschar (tan, brown or black) in the wound bed. (Active)  05/13/21 0400  Location: Ankle  Location Orientation: Left;Lateral  Staging: Unstageable - Full thickness tissue loss in which the base of the injury is covered by slough (yellow, tan, gray, green or brown) and/or eschar (tan, brown or black) in the wound bed.  Wound Description (Comments):   Present on Admission: Yes     Pressure Injury 05/13/21 Coccyx Unstageable - Full thickness tissue loss in which the base of the injury is covered by slough (yellow, tan, gray, green or brown) and/or eschar (tan, brown or black) in the wound bed. (Active)  05/13/21 0400  Location: Coccyx  Location Orientation:   Staging: Unstageable - Full thickness tissue loss in which the base of the injury is covered by slough (yellow, tan, gray, green or brown) and/or eschar (tan, brown or black) in the wound bed.  Wound Description (Comments):   Present on Admission: Yes     Pressure Injury 05/13/21 Buttocks Left;Lower (Active)  05/13/21 0400  Location: Buttocks  Location Orientation: Left;Lower  Staging:   Wound Description (Comments):   Present on Admission: Yes     Pressure Injury 05/13/21 Buttocks Right (Active)  05/13/21 0400  Location: Buttocks  Location Orientation: Right  Staging:   Wound Description (Comments):   Present on Admission:  Yes      Procedures: 11/29>> temporary HD catheter placed by IR. Consults: Renal DVT Prophylaxis: SCD Code Status:Full code  Family Communication: Ex wife-[name] [phone] voicemail on 12/1  Time spent: 35 minutes-Greater than 50% of this time was spent in counseling, explanation of diagnosis, planning of further management, and coordination of care.   Disposition Plan: Status is: Inpatient  Remains inpatient appropriate because: AKI/encephalopathy   Diet: Diet Order             Diet regular Room service appropriate? Yes; Fluid consistency: Thin  Diet effective now                     Antimicrobial agents: Anti-infectives (From admission, onward)    Start     Dose/Rate Route Frequency Ordered Stop   05/12/21 0715  cefTRIAXone (ROCEPHIN) 2 g in sodium chloride 0.9 % 100 mL IVPB        2 g 200 mL/hr over 30 Minutes Intravenous Every 24 hours 05/12/21 0701          MEDICATIONS: Scheduled Meds:  Chlorhexidine Gluconate Cloth  6 each Topical Q0600   Chlorhexidine Gluconate Cloth  6 each Topical Q0600   [START ON 05/16/2021] collagenase   Topical Daily   folic acid  1 mg Intravenous Daily   Gerhardt's butt cream   Topical Daily   lactulose  300 mL Rectal BID   methylPREDNISolone (SOLU-MEDROL) injection  30 mg Intravenous Daily  multivitamin with minerals  1 tablet Oral Daily   pantoprazole (PROTONIX) IV  40 mg Intravenous Q24H   sodium hypochlorite   Irrigation BID   thiamine  100 mg Oral Daily   Or   thiamine  100 mg Intravenous Daily   Continuous Infusions:  cefTRIAXone (ROCEPHIN)  IV 2 g (05/15/21 0621)   dextrose 85 mL/hr at 05/14/21 2240   PRN Meds:.lidocaine-EPINEPHrine   I have personally reviewed following labs and imaging studies  LABORATORY DATA: CBC: Recent Labs  Lab 05/12/21 0053 05/12/21 0522 05/13/21 0318 05/14/21 0209 05/15/21 0711  WBC 13.1* 11.9* 7.9 5.6 6.3  NEUTROABS 10.8* 9.5*  --   --   --   HGB 9.8* 9.4*  7.6* 7.4* 7.5*  HCT 29.6* 28.1* 23.4* 23.0* 22.3*  MCV 96.4 95.9 95.5 100.9* 94.9  PLT 309 257 201 143* 131*     Basic Metabolic Panel: Recent Labs  Lab 05/12/21 0053 05/12/21 0522 05/13/21 0318 05/14/21 0209 05/15/21 0711  NA 144 146* 143 142 137  K 5.1 4.6 3.6 3.7 3.5  CL 112* 113* 115* 116* 101  CO2 13* 13* 18* 18* 26  GLUCOSE 198* 174* 209* 265* 208*  BUN 174* 181* 164* 133* 69*  CREATININE 4.56* 4.84* 4.68* 3.66* 2.41*  CALCIUM 9.0 9.1 8.0* 7.7* 7.9*     GFR: CrCl cannot be calculated (Unknown ideal weight.).  Liver Function Tests: Recent Labs  Lab 05/12/21 0053 05/12/21 0522 05/13/21 0318 05/14/21 0209 05/15/21 0711  AST 479* 402* 244* 160* 98*  ALT 231* 217* 175* 156* 132*  ALKPHOS 100 95 88 94 80  BILITOT 5.3* 4.5* 2.4* 2.1* 1.6*  PROT 7.2 6.9 5.3* 5.2* 5.2*  ALBUMIN 3.5 3.4* 2.3* 2.2* 2.2*    Recent Labs  Lab 05/12/21 0053  LIPASE 54*    Recent Labs  Lab 05/12/21 0053 05/12/21 0522 05/13/21 0318 05/14/21 0209  AMMONIA 72* 133* 38* 35     Coagulation Profile: Recent Labs  Lab 05/12/21 0522  INR 1.6*     Cardiac Enzymes: Recent Labs  Lab 05/12/21 0053 05/13/21 0318  CKTOTAL 8,313* 2,167*     BNP (last 3 results) No results for input(s): PROBNP in the last 8760 hours.  Lipid Profile: No results for input(s): CHOL, HDL, LDLCALC, TRIG, CHOLHDL, LDLDIRECT in the last 72 hours.  Thyroid Function Tests: No results for input(s): TSH, T4TOTAL, FREET4, T3FREE, THYROIDAB in the last 72 hours.  Anemia Panel: No results for input(s): VITAMINB12, FOLATE, FERRITIN, TIBC, IRON, RETICCTPCT in the last 72 hours.   Urine analysis:    Component Value Date/Time   COLORURINE YELLOW 05/14/2021 [place] 05/14/2021 1258   LABSPEC 1.020 05/14/2021 1258   PHURINE 5.5 05/14/2021 1258   GLUCOSEU NEGATIVE 05/14/2021 1258   HGBUR LARGE (A) 05/14/2021 1258   BILIRUBINUR NEGATIVE 05/14/2021 1258   BILIRUBINUR 2+ 05/17/2015  1013   KETONESUR NEGATIVE 05/14/2021 1258   PROTEINUR NEGATIVE 05/14/2021 1258   UROBILINOGEN 0.2 05/17/2015 1013   NITRITE NEGATIVE 05/14/2021 1258   LEUKOCYTESUR NEGATIVE 05/14/2021 1258    Sepsis Labs: Lactic Acid, Venous    Component Value Date/Time   LATICACIDVEN 1.4 05/13/2021 0318    MICROBIOLOGY: Recent Results (from the past 240 hour(s))  Resp Panel by RT-PCR (Flu A&B, Covid) Nasopharyngeal Swab     Status: None   Collection Time: 05/12/21  1:00 AM   Specimen: Nasopharyngeal Swab; Nasopharyngeal(NP) swabs in vial transport medium  Result Value Ref Range Status   SARS  Coronavirus 2 by RT PCR NEGATIVE NEGATIVE Final    Comment: (NOTE) SARS-CoV-2 target nucleic acids are NOT DETECTED.  The SARS-CoV-2 RNA is generally detectable in upper respiratory specimens during the acute phase of infection. The lowest concentration of SARS-CoV-2 viral copies this assay can detect is 138 copies/mL. A negative result does not preclude SARS-Cov-2 infection and should not be used as the sole basis for treatment or other patient management decisions. A negative result may occur with  improper specimen collection/handling, submission of specimen other than nasopharyngeal swab, presence of viral mutation(s) within the areas targeted by this assay, and inadequate number of viral copies(<138 copies/mL). A negative result must be combined with clinical observations, patient history, and epidemiological information. The expected result is Negative.  Fact Sheet for Patients:  EntrepreneurPulse.com.au  Fact Sheet for Healthcare Providers:  IncredibleEmployment.be  This test is no t yet approved or cleared by the [place] FDA and  has been authorized for detection and/or diagnosis of SARS-CoV-2 by FDA under an Emergency Use Authorization (EUA). This EUA will remain  in effect (meaning this test can be used) for the duration of the COVID-19  declaration under Section 564(b)(1) of the Act, 21 U.S.C.section 360bbb-3(b)(1), unless the authorization is terminated  or revoked sooner.       Influenza A by PCR NEGATIVE NEGATIVE Final   Influenza B by PCR NEGATIVE NEGATIVE Final    Comment: (NOTE) The Xpert Xpress SARS-CoV-2/FLU/RSV plus assay is intended as an aid in the diagnosis of influenza from Nasopharyngeal swab specimens and should not be used as a sole basis for treatment. Nasal washings and aspirates are unacceptable for Xpert Xpress SARS-CoV-2/FLU/RSV testing.  Fact Sheet for Patients: EntrepreneurPulse.com.au  Fact Sheet for Healthcare Providers: IncredibleEmployment.be  This test is not yet approved or cleared by the [place] FDA and has been authorized for detection and/or diagnosis of SARS-CoV-2 by FDA under an Emergency Use Authorization (EUA). This EUA will remain in effect (meaning this test can be used) for the duration of the COVID-19 declaration under Section 564(b)(1) of the Act, 21 U.S.C. section 360bbb-3(b)(1), unless the authorization is terminated or revoked.  Performed at [facility], [place] [address] [place], [place] [address]     RADIOLOGY STUDIES/RESULTS: IR Fluoro Guide CV Line Right  Result Date: 05/13/2021 INDICATION: 55 year old male presenting in acute renal failure requiring access for hemodialysis. EXAM: NON-TUNNELED CENTRAL VENOUS HEMODIALYSIS CATHETER PLACEMENT WITH ULTRASOUND AND FLUOROSCOPIC GUIDANCE COMPARISON:  None. MEDICATIONS: None FLUOROSCOPY TIME:  0 minutes, 18 seconds (3.7 mGy) COMPLICATIONS: None immediate. PROCEDURE: Informed written consent was obtained from the patient after a discussion of the risks, benefits, and alternatives to treatment. Questions regarding the procedure were encouraged and answered. The right neck and chest were prepped with chlorhexidine in a sterile fashion, and a sterile drape was applied  covering the operative field. Maximum barrier sterile technique with sterile gowns and gloves were used for the procedure. A timeout was performed prior to the initiation of the procedure. After the overlying soft tissues were anesthetized, a small venotomy incision was created and a micropuncture kit was utilized to access the internal jugular vein. Real-time ultrasound guidance was utilized for vascular access including the acquisition of a permanent ultrasound image documenting patency of the accessed vessel. The microwire was utilized to measure appropriate catheter length. A stiff glidewire was advanced to the level of the IVC. Under fluoroscopic guidance, the venotomy was serially dilated, ultimately allowing placement of a 16 cm temporary Trialysis catheter  with tip ultimately terminating within the superior aspect of the right atrium. Final catheter positioning was confirmed and documented with a spot radiographic image. The catheter aspirates and flushes normally. The catheter was flushed with appropriate volume heparin dwells. The catheter exit site was secured with a 0-Silk retention suture. A dressing was placed. The patient tolerated the procedure well without immediate post procedural complication. IMPRESSION: Successful placement of a right internal jugular approach 16 cm temporary dialysis catheter with tip terminating with in the superior aspect of the right atrium. The catheter is ready for immediate use. PLAN: This catheter may be converted to a tunneled dialysis catheter at a later date as indicated. Ruthann Cancer, MD [facility] Electronically Signed   By: Ruthann Cancer M.D.   On: 05/13/2021 14:13   IR US Guide Vasc Access Right  Result Date: 05/13/2021 INDICATION: 55 year old male presenting in acute renal failure requiring access for hemodialysis. EXAM: NON-TUNNELED CENTRAL VENOUS HEMODIALYSIS CATHETER PLACEMENT WITH ULTRASOUND AND  FLUOROSCOPIC GUIDANCE COMPARISON:  None. MEDICATIONS: None FLUOROSCOPY TIME:  0 minutes, 18 seconds (3.7 mGy) COMPLICATIONS: None immediate. PROCEDURE: Informed written consent was obtained from the patient after a discussion of the risks, benefits, and alternatives to treatment. Questions regarding the procedure were encouraged and answered. The right neck and chest were prepped with chlorhexidine in a sterile fashion, and a sterile drape was applied covering the operative field. Maximum barrier sterile technique with sterile gowns and gloves were used for the procedure. A timeout was performed prior to the initiation of the procedure. After the overlying soft tissues were anesthetized, a small venotomy incision was created and a micropuncture kit was utilized to access the internal jugular vein. Real-time ultrasound guidance was utilized for vascular access including the acquisition of a permanent ultrasound image documenting patency of the accessed vessel. The microwire was utilized to measure appropriate catheter length. A stiff glidewire was advanced to the level of the IVC. Under fluoroscopic guidance, the venotomy was serially dilated, ultimately allowing placement of a 16 cm temporary Trialysis catheter with tip ultimately terminating within the superior aspect of the right atrium. Final catheter positioning was confirmed and documented with a spot radiographic image. The catheter aspirates and flushes normally. The catheter was flushed with appropriate volume heparin dwells. The catheter exit site was secured with a 0-Silk retention suture. A dressing was placed. The patient tolerated the procedure well without immediate post procedural complication. IMPRESSION: Successful placement of a right internal jugular approach 16 cm temporary dialysis catheter with tip terminating with in the superior aspect of the right atrium. The catheter is ready for immediate use. PLAN: This catheter may be converted to a  tunneled dialysis catheter at a later date as indicated. Ruthann Cancer, MD [facility] Electronically Signed   By: Ruthann Cancer M.D.   On: 05/13/2021 14:13     LOS: 3 days   Oren Binet, MD  [facility]    To contact the attending provider between 7A-7P or the covering provider during after hours 7P-7A, please log into the web site [email or web address] and access using universal [place] password for that web site. If you do not have the password, please call the hospital operator.  05/15/2021, 1:09 PM

## 2021-05-15 NOTE — Plan of Care (Signed)
  Problem: Education: Goal: Knowledge of General Education information will improve Description: Including pain rating scale, medication(s)/side effects and non-pharmacologic comfort measures Outcome: Progressing   Problem: Clinical Measurements: Goal: Ability to maintain clinical measurements within normal limits will improve Outcome: Progressing Goal: Will remain free from infection Outcome: Progressing Goal: Diagnostic test results will improve Outcome: Progressing Goal: Respiratory complications will improve Outcome: Progressing Goal: Cardiovascular complication will be avoided Outcome: Progressing   Problem: Elimination: Goal: Will not experience complications related to bowel motility Outcome: Progressing Goal: Will not experience complications related to urinary retention Outcome: Progressing   

## 2021-05-16 LAB — COMPREHENSIVE METABOLIC PANEL
ALT: 129 U/L — ABNORMAL HIGH (ref 0–44)
AST: 84 U/L — ABNORMAL HIGH (ref 15–41)
Albumin: 2.3 g/dL — ABNORMAL LOW (ref 3.5–5.0)
Alkaline Phosphatase: 96 U/L (ref 38–126)
Anion gap: 10 (ref 5–15)
BUN: 71 mg/dL — ABNORMAL HIGH (ref 6–20)
CO2: 23 mmol/L (ref 22–32)
Calcium: 8.1 mg/dL — ABNORMAL LOW (ref 8.9–10.3)
Chloride: 103 mmol/L (ref 98–111)
Creatinine, Ser: 2.49 mg/dL — ABNORMAL HIGH (ref 0.61–1.24)
GFR, Estimated: 30 mL/min — ABNORMAL LOW (ref >60)
Glucose, Bld: 218 mg/dL — ABNORMAL HIGH (ref 70–99)
Potassium: 3.8 mmol/L (ref 3.5–5.1)
Sodium: 136 mmol/L (ref 135–145)
Total Bilirubin: 1.5 mg/dL — ABNORMAL HIGH (ref 0.3–1.2)
Total Protein: 5.5 g/dL — ABNORMAL LOW (ref 6.5–8.1)

## 2021-05-16 LAB — CBC
HCT: 23.7 % — ABNORMAL LOW (ref 39.0–52.0)
Hemoglobin: 7.8 g/dL — ABNORMAL LOW (ref 13.0–17.0)
MCH: 31.2 pg (ref 26.0–34.0)
MCHC: 32.9 g/dL (ref 30.0–36.0)
MCV: 94.8 fL (ref 80.0–100.0)
Platelets: 130 10*3/uL — ABNORMAL LOW (ref 150–400)
RBC: 2.5 MIL/uL — ABNORMAL LOW (ref 4.22–5.81)
RDW: 13.2 % (ref 11.5–15.5)
WBC: 7.8 10*3/uL (ref 4.0–10.5)
nRBC: 0 % (ref 0.0–0.2)

## 2021-05-16 LAB — CK: Total CK: 374 U/L (ref 49–397)

## 2021-05-16 MED ORDER — PANTOPRAZOLE SODIUM 40 MG PO TBEC
40.0000 mg | DELAYED_RELEASE_TABLET | Freq: Every day | ORAL | Status: DC
  Administered 2021-05-17 – 2021-05-21 (×5): 40 mg via ORAL
  Filled 2021-05-16 (×5): qty 1

## 2021-05-16 MED ORDER — PREDNISOLONE 5 MG PO TABS
40.0000 mg | ORAL_TABLET | Freq: Every day | ORAL | Status: DC
  Administered 2021-05-17 – 2021-05-19 (×3): 40 mg via ORAL
  Filled 2021-05-16 (×3): qty 8

## 2021-05-16 MED ORDER — LORAZEPAM 2 MG/ML IJ SOLN
1.0000 mg | Freq: Four times a day (QID) | INTRAMUSCULAR | Status: DC | PRN
  Administered 2021-05-19 – 2021-05-20 (×3): 1 mg via INTRAVENOUS
  Filled 2021-05-16 (×3): qty 1

## 2021-05-16 MED ORDER — FOLIC ACID 1 MG PO TABS
1.0000 mg | ORAL_TABLET | Freq: Every day | ORAL | Status: DC
  Administered 2021-05-17 – 2021-05-21 (×5): 1 mg via ORAL
  Filled 2021-05-16 (×5): qty 1

## 2021-05-16 MED ORDER — CHLORDIAZEPOXIDE HCL 5 MG PO CAPS
10.0000 mg | ORAL_CAPSULE | Freq: Three times a day (TID) | ORAL | Status: DC
  Administered 2021-05-16 – 2021-05-19 (×10): 10 mg via ORAL
  Filled 2021-05-16 (×10): qty 2

## 2021-05-16 MED ORDER — LACTULOSE 10 GM/15ML PO SOLN
30.0000 g | Freq: Three times a day (TID) | ORAL | Status: DC
  Administered 2021-05-16 – 2021-05-20 (×10): 30 g via ORAL
  Filled 2021-05-16 (×12): qty 45

## 2021-05-16 NOTE — Progress Notes (Addendum)
PROGRESS NOTE        PATIENT DETAILS Name: Martin Anthony Age: 55 y.o. Sex: male Date of Birth: 1966-04-28 Admit Date: 05/11/2021 Admitting Physician Rise Patience, MD PCP:Fry, Ishmael Holter, MD  Brief Narrative: Patient is a 55 y.o. male with history of liver cirrhosis, EtOH use, PAF-who presented with confusion/found down at home (found by ex wife)-patient was found to have acute metabolic encephalopathy in the setting of AKI/hepatic encephalopathy.  Initially monitored at [facility] transferred to [facility] for potential dialysis.  See below for details.  Subjective: Continues to improve-but per nursing staff-continues to have episodes of agitation and confusion.  This morning-he was able to answer most of my questions appropriately.  Objective: Vitals: Blood pressure 109/89, pulse 66, temperature 98.6 F (37 C), temperature source Oral, resp. rate 16, weight 76 kg, SpO2 98 %.   Exam: Gen Exam: Awake-answering questions appropriately.  Not in any distress. HEENT:atraumatic, normocephalic Chest: B/L clear to auscultation anteriorly CVS:S1S2 regular Abdomen:soft non tender, non distended Extremities:no edema Neurology: Non focal Skin: no rash   Pertinent Labs/Radiology: Recent Labs  Lab 05/12/21 0522 05/13/21 0318 05/14/21 0209 05/15/21 0711 05/16/21 0226  WBC 11.9* 7.9 5.6 6.3 7.8  HGB 9.4* 7.6* 7.4* 7.5* 7.8*  PLT 257 201 143* 131* 130*  NA 146* 143 142 137 136  K 4.6 3.6 3.7 3.5 3.8  CREATININE 4.84* 4.68* 3.66* 2.41* 2.49*  AST 402* 244* 160* 98* 84*  ALT 217* 175* 156* 132* 129*  ALKPHOS 95 88 94 80 96  BILITOT 4.5* 2.4* 2.1* 1.6* 1.5*     Assessment/Plan: Acute metabolic encephalopathy: Multifactorial-due to uremic encephalopathy, hepatic encephalopathy, possible alcohol withdrawal symptoms.  CT head without any acute abnormalities.  Mental status is now gradually improving-he still has episodes of agitation and confusion.  Renal function  continues to improve-on lactulose for hepatic encephalopathy-remains on Ativan per CIWA scale-since he continues to have episodes of agitation/confusion-we will start him on low-dose Librium that will need to be tapered off over the next few days.    AKI: Suspicion for hemodynamically mediated kidney injury-no hydronephrosis seen on CT imaging.  No proteinuria on UA.  Renal function seems to be improving-electrolytes stable-significant urine output overnight.  Did receive 1 session of hemodialysis on 11/30-continue to monitor renal recovery.  We will attempt to remove Foley catheter today and perform a voiding trial.  Addendum: Informed by RN that nephrology wants to keep Foley catheter in situ for a few days.  We will plan to perform a voiding trial over the next few days.  Alcoholic hepatitis: Transaminitis improving-acute hepatitis serology was negative-discriminant function around 36-since mental status has improved further-switch from IV Solu-Medrol to oral prednisone.   Normocytic anemia: Due to acute illness-AKI-no overt signs of GI bleeding/blood loss-plan to transfuse if hb< 7  Rhabdomyolysis: Due to being down on the floor prior to this hospitalization-CK levels have almost normalized.  Liver cirrhosis/portal hypertension/esophageal varices: Overall stable-continue to hold diuretics-resume beta-blocker in the next few days.    Hepatic encephalopathy: Since mental status is better-and patient not able to take oral intake-we will switch from lactulose enema to oral lactulose.   Alcohol withdrawal EtOH use/alcohol withdrawal: Although with significant improvement-Per nursing staff-he continues to have episodes of agitation and confusion-we will start low-dose Librium and taper over the next few days.    History of PAF:  Not a candidate for anticoagulation-currently maintaining sinus rhythm  History of ascending colon tubular adenoma with high-grade dysplasia: Seen on colonoscopy in August  2022-never followed up with GI postdischarge-will need repeat endoscopic evaluation once more stable.   Depression: Resume Zoloft over the next few days.  BMI Estimated body mass index is 20.94 kg/m as calculated from the following:   Height as of 07/03/20: 6\' 3"  (1.905 m).   Weight as of this encounter: 76 kg.   Wound care Unstageable Pressure injuries to Bilateral Ankles and Coccyx, present on admission pressure injuries: See below  Pressure Injury 05/13/21 Ankle Right;Lateral Unstageable - Full thickness tissue loss in which the base of the injury is covered by slough (yellow, tan, gray, green or brown) and/or eschar (tan, brown or black) in the wound bed. (Active)  05/13/21 0400  Location: Ankle  Location Orientation: Right;Lateral  Staging: Unstageable - Full thickness tissue loss in which the base of the injury is covered by slough (yellow, tan, gray, green or brown) and/or eschar (tan, brown or black) in the wound bed.  Wound Description (Comments):   Present on Admission: Yes     Pressure Injury 05/13/21 Ankle Left;Lateral Unstageable - Full thickness tissue loss in which the base of the injury is covered by slough (yellow, tan, gray, green or brown) and/or eschar (tan, brown or black) in the wound bed. (Active)  05/13/21 0400  Location: Ankle  Location Orientation: Left;Lateral  Staging: Unstageable - Full thickness tissue loss in which the base of the injury is covered by slough (yellow, tan, gray, green or brown) and/or eschar (tan, brown or black) in the wound bed.  Wound Description (Comments):   Present on Admission: Yes     Pressure Injury 05/13/21 Coccyx Unstageable - Full thickness tissue loss in which the base of the injury is covered by slough (yellow, tan, gray, green or brown) and/or eschar (tan, brown or black) in the wound bed. (Active)  05/13/21 0400  Location: Coccyx  Location Orientation:   Staging: Unstageable - Full thickness tissue loss in which the base  of the injury is covered by slough (yellow, tan, gray, green or brown) and/or eschar (tan, brown or black) in the wound bed.  Wound Description (Comments):   Present on Admission: Yes     Pressure Injury 05/13/21 Buttocks Left;Lower (Active)  05/13/21 0400  Location: Buttocks  Location Orientation: Left;Lower  Staging:   Wound Description (Comments):   Present on Admission: Yes     Pressure Injury 05/13/21 Buttocks Right (Active)  05/13/21 0400  Location: Buttocks  Location Orientation: Right  Staging:   Wound Description (Comments):   Present on Admission: Yes      Procedures: 11/29>> temporary HD catheter placed by IR. Consults: Renal DVT Prophylaxis: SCD Code Status:Full code  Family Communication: Ex wife-[name] [phone] voicemail on 12/2  Time spent: 25 minutes-Greater than 50% of this time was spent in counseling, explanation of diagnosis, planning of further management, and coordination of care.   Disposition Plan: Status is: Inpatient  Remains inpatient appropriate because: AKI/encephalopathy   Diet: Diet Order             Diet regular Room service appropriate? No; Fluid consistency: Thin  Diet effective now                     Antimicrobial agents: Anti-infectives (From admission, onward)    Start     Dose/Rate Route Frequency Ordered Stop   05/12/21 0715  cefTRIAXone (ROCEPHIN)  2 g in sodium chloride 0.9 % 100 mL IVPB        2 g 200 mL/hr over 30 Minutes Intravenous Every 24 hours 05/12/21 0701          MEDICATIONS: Scheduled Meds:  chlordiazePOXIDE  10 mg Oral TID   Chlorhexidine Gluconate Cloth  6 each Topical Q0600   Chlorhexidine Gluconate Cloth  6 each Topical Q0600   collagenase   Topical Daily   folic acid  1 mg Intravenous Daily   Gerhardt's butt cream   Topical Daily   lactulose  30 g Oral TID   multivitamin with minerals  1 tablet Oral Daily   pantoprazole (PROTONIX) IV  40 mg Intravenous Q24H   [START ON  05/17/2021] prednisoLONE  40 mg Oral Daily   thiamine  100 mg Oral Daily   Or   thiamine  100 mg Intravenous Daily   Continuous Infusions:  cefTRIAXone (ROCEPHIN)  IV 2 g (05/16/21 0637)   dextrose 5 % and 0.2 % NaCl 75 mL/hr at 05/16/21 1022   PRN Meds:.lidocaine-EPINEPHrine   I have personally reviewed following labs and imaging studies  LABORATORY DATA: CBC: Recent Labs  Lab 05/12/21 0053 05/12/21 0522 05/13/21 0318 05/14/21 0209 05/15/21 0711 05/16/21 0226  WBC 13.1* 11.9* 7.9 5.6 6.3 7.8  NEUTROABS 10.8* 9.5*  --   --   --   --   HGB 9.8* 9.4* 7.6* 7.4* 7.5* 7.8*  HCT 29.6* 28.1* 23.4* 23.0* 22.3* 23.7*  MCV 96.4 95.9 95.5 100.9* 94.9 94.8  PLT 309 257 201 143* 131* 130*     Basic Metabolic Panel: Recent Labs  Lab 05/12/21 0522 05/13/21 0318 05/14/21 0209 05/15/21 0711 05/16/21 0226  NA 146* 143 142 137 136  K 4.6 3.6 3.7 3.5 3.8  CL 113* 115* 116* 101 103  CO2 13* 18* 18* 26 23  GLUCOSE 174* 209* 265* 208* 218*  BUN 181* 164* 133* 69* 71*  CREATININE 4.84* 4.68* 3.66* 2.41* 2.49*  CALCIUM 9.1 8.0* 7.7* 7.9* 8.1*     GFR: CrCl cannot be calculated (Unknown ideal weight.).  Liver Function Tests: Recent Labs  Lab 05/12/21 0522 05/13/21 0318 05/14/21 0209 05/15/21 0711 05/16/21 0226  AST 402* 244* 160* 98* 84*  ALT 217* 175* 156* 132* 129*  ALKPHOS 95 88 94 80 96  BILITOT 4.5* 2.4* 2.1* 1.6* 1.5*  PROT 6.9 5.3* 5.2* 5.2* 5.5*  ALBUMIN 3.4* 2.3* 2.2* 2.2* 2.3*    Recent Labs  Lab 05/12/21 0053  LIPASE 54*    Recent Labs  Lab 05/12/21 0053 05/12/21 0522 05/13/21 0318 05/14/21 0209  AMMONIA 72* 133* 38* 35     Coagulation Profile: Recent Labs  Lab 05/12/21 0522  INR 1.6*     Cardiac Enzymes: Recent Labs  Lab 05/12/21 0053 05/13/21 0318 05/16/21 0226  CKTOTAL 8,313* 2,167* 374     BNP (last 3 results) No results for input(s): PROBNP in the last 8760 hours.  Lipid Profile: No results for input(s): CHOL, HDL,  LDLCALC, TRIG, CHOLHDL, LDLDIRECT in the last 72 hours.  Thyroid Function Tests: No results for input(s): TSH, T4TOTAL, FREET4, T3FREE, THYROIDAB in the last 72 hours.  Anemia Panel: No results for input(s): VITAMINB12, FOLATE, FERRITIN, TIBC, IRON, RETICCTPCT in the last 72 hours.   Urine analysis:    Component Value Date/Time   COLORURINE YELLOW 05/14/2021 [place] 05/14/2021 1258   LABSPEC 1.020 05/14/2021 1258   PHURINE 5.5 05/14/2021 1258   GLUCOSEU NEGATIVE  05/14/2021 1258   HGBUR LARGE (A) 05/14/2021 1258   BILIRUBINUR NEGATIVE 05/14/2021 1258   BILIRUBINUR 2+ 05/17/2015 1013   KETONESUR NEGATIVE 05/14/2021 1258   PROTEINUR NEGATIVE 05/14/2021 1258   UROBILINOGEN 0.2 05/17/2015 1013   NITRITE NEGATIVE 05/14/2021 [place] 05/14/2021 1258    Sepsis Labs: Lactic Acid, Venous    Component Value Date/Time   LATICACIDVEN 1.4 05/13/2021 0318    MICROBIOLOGY: Recent Results (from the past 240 hour(s))  Resp Panel by RT-PCR (Flu A&B, Covid) Nasopharyngeal Swab     Status: None   Collection Time: 05/12/21  1:00 AM   Specimen: Nasopharyngeal Swab; Nasopharyngeal(NP) swabs in vial transport medium  Result Value Ref Range Status   SARS Coronavirus 2 by RT PCR NEGATIVE NEGATIVE Final    Comment: (NOTE) SARS-CoV-2 target nucleic acids are NOT DETECTED.  The SARS-CoV-2 RNA is generally detectable in upper respiratory specimens during the acute phase of infection. The lowest concentration of SARS-CoV-2 viral copies this assay can detect is 138 copies/mL. A negative result does not preclude SARS-Cov-2 infection and should not be used as the sole basis for treatment or other patient management decisions. A negative result may occur with  improper specimen collection/handling, submission of specimen other than nasopharyngeal swab, presence of viral mutation(s) within the areas targeted by this assay, and inadequate number of  viral copies(<138 copies/mL). A negative result must be combined with clinical observations, patient history, and epidemiological information. The expected result is Negative.  Fact Sheet for Patients:  EntrepreneurPulse.com.au  Fact Sheet for Healthcare Providers:  IncredibleEmployment.be  This test is no t yet approved or cleared by the [place] FDA and  has been authorized for detection and/or diagnosis of SARS-CoV-2 by FDA under an Emergency Use Authorization (EUA). This EUA will remain  in effect (meaning this test can be used) for the duration of the COVID-19 declaration under Section 564(b)(1) of the Act, 21 U.S.C.section 360bbb-3(b)(1), unless the authorization is terminated  or revoked sooner.       Influenza A by PCR NEGATIVE NEGATIVE Final   Influenza B by PCR NEGATIVE NEGATIVE Final    Comment: (NOTE) The Xpert Xpress SARS-CoV-2/FLU/RSV plus assay is intended as an aid in the diagnosis of influenza from Nasopharyngeal swab specimens and should not be used as a sole basis for treatment. Nasal washings and aspirates are unacceptable for Xpert Xpress SARS-CoV-2/FLU/RSV testing.  Fact Sheet for Patients: EntrepreneurPulse.com.au  Fact Sheet for Healthcare Providers: IncredibleEmployment.be  This test is not yet approved or cleared by the [place] FDA and has been authorized for detection and/or diagnosis of SARS-CoV-2 by FDA under an Emergency Use Authorization (EUA). This EUA will remain in effect (meaning this test can be used) for the duration of the COVID-19 declaration under Section 564(b)(1) of the Act, 21 U.S.C. section 360bbb-3(b)(1), unless the authorization is terminated or revoked.  Performed at [facility], [place] [address] [place], [place] [address]     RADIOLOGY STUDIES/RESULTS: No results found.   LOS: 4 days   Oren Binet, MD  [facility]    To contact the attending provider between 7A-7P or the covering provider during after hours 7P-7A, please log into the web site [email or web address] and access using universal [place] password for that web site. If you do not have the password, please call the hospital operator.  05/16/2021, 10:35 AM

## 2021-05-16 NOTE — TOC Initial Note (Addendum)
Transition of Care [facility]) - Initial/Assessment Note    Patient Details  Name: Martin Anthony MRN: [id number] Date of Birth: 1966-05-03  [facility] CM/SW Contact:    Verdell Carmine, RN Phone Number: 05/16/2021, 10:13 AM  Clinical Narrative:                  55 Yo patient presented with AMS history of Cirrhosis, portal hypertension found on the floor at his home.  In acute renal failure, encephalopathy. According to documentation, ex-wife found him and called EMS.  Patient is still confused and in restraints as he is pulling out tubes and lines. PT when patient more lucid. NOK listed as wife however it appears  they are not living together at this time. Patient uninsured, may need MATCH for medications. Will send to [facility] for follow up CM will follow for needs, recommendations, and transitions.   Barriers to Discharge: Inadequate or no insurance   Patient Goals and CMS Choice        Expected Discharge Plan and Services     Discharge Planning Services: CM Consult   Living arrangements for the past 2 months: Single Family Home                                      Prior Living Arrangements/Services Living arrangements for the past 2 months: Single Family Home Lives with:: Self Patient language and need for interpreter reviewed:: Yes        Need for Family Participation in Patient Care: Yes (Comment) Care giver support system in place?: Yes (comment)   Criminal Activity/Legal Involvement Pertinent to Current Situation/Hospitalization: No - Comment as needed  Activities of Daily Living Home Assistive Devices/Equipment: Eyeglasses ADL Screening (condition at time of admission) Patient's cognitive ability adequate to safely complete daily activities?: No Is the patient deaf or have difficulty hearing?: No Does the patient have difficulty seeing, even when wearing glasses/contacts?: No Does the patient have difficulty concentrating,  remembering, or making decisions?: Yes Patient able to express need for assistance with ADLs?: Yes Does the patient have difficulty dressing or bathing?: No Independently performs ADLs?: Yes (appropriate for developmental age) Does the patient have difficulty walking or climbing stairs?: No Weakness of Legs: None Weakness of Arms/Hands: None  Permission Sought/Granted                  Emotional Assessment       Orientation: : Fluctuating Orientation (Suspected and/or reported Sundowners) Alcohol / Substance Use: Alcohol Use Psych Involvement: No (comment)  Admission diagnosis:  Swelling [R60.9] ARF (acute renal failure) ([place]) [N17.9] Fall [W19.XXXA] Encephalopathy [G93.40] Non-traumatic rhabdomyolysis [M62.82] Patient Active Problem List   Diagnosis Date Noted   ARF (acute renal failure) ([place]) 05/12/2021   Rhabdomyolysis 05/12/2021   Elevated LFTs 05/12/2021   Acute encephalopathy 05/12/2021   Ascites    Anemia 01/18/2019   Colonic mass 01/18/2019   Abdominal pain 01/18/2019   Diabetes mellitus without complication ([place]) 85/07/7739   Alcohol abuse 06/25/2015   Gout 10/03/2013   HYPERLIPIDEMIA 05/20/2007   Depression with anxiety 05/20/2007   Essential hypertension 05/20/2007   GERD 05/20/2007   PCP:  [name], MD Pharmacy:   [id number] [address], [place] [address] Phone: [phone] Fax: [phone]  [facility] [place], [address]  [place] [address] Phone: [phone] Fax: [phone]     Social Determinants of Health (SDOH) Interventions    Readmission Risk Interventions No flowsheet data found.

## 2021-05-16 NOTE — Progress Notes (Addendum)
[facility] Progress Note  Subjective: 1400 cc UOP yesterday, still restless, in mittens and restraints, creat 2.4 > 2.4 today.   Vitals:   05/15/21 2009 05/16/21 0435 05/16/21 0757 05/16/21 1154  BP: 113/72 (!) 145/89 109/89 129/74  Pulse: 65 68 66 75  Resp: 13 14 16 16   Temp: 98.4 F (36.9 C) 98.6 F (37 C)    TempSrc: Oral Oral    SpO2: 97% 98%    Weight:        Exam: Gen is awake now, Ox 2022, [place], "warehouse" No jvd or bruits Chest clear bilat to bases, no rales/ wheezing RRR no MRG Abd soft ntnd no mass or ascites +bs GU normal male w/ foley in place Ext 1+ pedal edema Neuro - awake and nonfocal exam        Home meds include - lasix 40 qd, nadolol 20 qd, zoloft 50, aldactone 100 qd         CT stone study noncon - cirrhosis, ^spleen, GG changes RML/ RLL, normal appearing kidneys bilat no hydro     CXR 11/28 - clear     UNa <10,  UCr 163       UA - still pending       Assessment/ Plan: AKI - in pt w/ hx ETOH abuse and possibly decompensated cirrhosis. No ascites on exam. CT w/o renal obstruction. UA pending. Urine lytes c/w prerenal. Looked dry on initial exam. BP's were low-normal. Suspect ATN from rhabdo and/or vol depletion + hypotension. Got 4L bolus 1st day then further IVF's. Looks vol replete now. Got 1 HD on 11/30 for persistent AMS. On 11/30 UOP improved dramatically and now he is likely recovering from AKI.  Creat stable today at 2.4.  No indication for RRT today. Lower IVF"s to 50 cc/hr, he is taking in po now. Keep temp HD cath in for now. May keep foley in or remove if needed.  Hypernatremia - resolved  Vol depletion - looks vol replete ETOH abuse Cirrhosis/ splenomegaly Confusion - since HD x 1 on 11/30 and improved UOP, pt is wide awake, still a bit disoriented though           [employer] 05/16/2021, 2:53 PM   Recent Labs  Lab 05/15/21 0711 05/16/21 0226  K 3.5 3.8  BUN 69* 71*  CREATININE 2.41* 2.49*  CALCIUM  7.9* 8.1*  HGB 7.5* 7.8*    Inpatient medications:  chlordiazePOXIDE  10 mg Oral TID   Chlorhexidine Gluconate Cloth  6 each Topical Q0600   Chlorhexidine Gluconate Cloth  6 each Topical Q0600   collagenase   Topical Daily   folic acid  1 mg Intravenous Daily   Gerhardt's butt cream   Topical Daily   lactulose  30 g Oral TID   multivitamin with minerals  1 tablet Oral Daily   pantoprazole (PROTONIX) IV  40 mg Intravenous Q24H   [START ON 05/17/2021] prednisoLONE  40 mg Oral Daily   thiamine  100 mg Oral Daily   Or   thiamine  100 mg Intravenous Daily    cefTRIAXone (ROCEPHIN)  IV 2 g (05/16/21 0637)   dextrose 5 % and 0.2 % NaCl 75 mL/hr at 05/16/21 1022   lidocaine-EPINEPHrine, LORazepam

## 2021-05-17 LAB — CBC
HCT: 25.1 % — ABNORMAL LOW (ref 39.0–52.0)
Hemoglobin: 8.2 g/dL — ABNORMAL LOW (ref 13.0–17.0)
MCH: 31.2 pg (ref 26.0–34.0)
MCHC: 32.7 g/dL (ref 30.0–36.0)
MCV: 95.4 fL (ref 80.0–100.0)
Platelets: 125 10*3/uL — ABNORMAL LOW (ref 150–400)
RBC: 2.63 MIL/uL — ABNORMAL LOW (ref 4.22–5.81)
RDW: 13.4 % (ref 11.5–15.5)
WBC: 8.4 10*3/uL (ref 4.0–10.5)
nRBC: 0 % (ref 0.0–0.2)

## 2021-05-17 LAB — COMPREHENSIVE METABOLIC PANEL
ALT: 130 U/L — ABNORMAL HIGH (ref 0–44)
AST: 96 U/L — ABNORMAL HIGH (ref 15–41)
Albumin: 2.3 g/dL — ABNORMAL LOW (ref 3.5–5.0)
Alkaline Phosphatase: 167 U/L — ABNORMAL HIGH (ref 38–126)
Anion gap: 7 (ref 5–15)
BUN: 61 mg/dL — ABNORMAL HIGH (ref 6–20)
CO2: 22 mmol/L (ref 22–32)
Calcium: 8.3 mg/dL — ABNORMAL LOW (ref 8.9–10.3)
Chloride: 107 mmol/L (ref 98–111)
Creatinine, Ser: 2.36 mg/dL — ABNORMAL HIGH (ref 0.61–1.24)
GFR, Estimated: 32 mL/min — ABNORMAL LOW (ref >60)
Glucose, Bld: 132 mg/dL — ABNORMAL HIGH (ref 70–99)
Potassium: 4.1 mmol/L (ref 3.5–5.1)
Sodium: 136 mmol/L (ref 135–145)
Total Bilirubin: 1.2 mg/dL (ref 0.3–1.2)
Total Protein: 5.6 g/dL — ABNORMAL LOW (ref 6.5–8.1)

## 2021-05-17 LAB — SODIUM, URINE, RANDOM: Sodium, Ur: 29 mmol/L

## 2021-05-17 LAB — CREATININE, URINE, RANDOM: Creatinine, Urine: 98.82 mg/dL

## 2021-05-17 NOTE — Progress Notes (Signed)
PROGRESS NOTE        PATIENT DETAILS Name: Martin Anthony Age: 55 y.o. Sex: male Date of Birth: 10-26-1965 Admit Date: 05/11/2021 Admitting Physician Rise Patience, MD PCP:Fry, Ishmael Holter, MD  Brief Narrative: Patient is a 55 y.o. male with history of liver cirrhosis, EtOH use, PAF-who presented with confusion/found down at home (found by ex wife)-patient was found to have acute metabolic encephalopathy in the setting of AKI/hepatic encephalopathy.  Initially monitored at [facility] transferred to [facility] for potential dialysis.  See below for details.  Subjective:  Patient in bed is awake but currently confused denies any headache chest or abdominal pain, thinks he is sitting in a warehouse and wants to go home.  Objective: Vitals: Blood pressure 124/69, pulse 82, temperature 98.2 F (36.8 C), temperature source Oral, resp. rate 15, weight 76 kg, SpO2 97 %.   Exam:  Awake but confused, No new F.N deficits,   [place].AT,PERRAL Supple Neck, No JVD,   Symmetrical Chest wall movement, Good air movement bilaterally, CTAB RRR,No Gallops, Rubs or new Murmurs,  +ve B.Sounds, Abd Soft, No tenderness,   No Cyanosis, Clubbing or edema     Assessment/Plan:  Acute metabolic encephalopathy: Multifactorial-due to uremic encephalopathy, hepatic encephalopathy, possible alcohol withdrawal symptoms.  CT head without any acute abnormalities.  Mental status is now gradually improving-he still has episodes of agitation and confusion.  Renal function continues to improve-on lactulose for hepatic encephalopathy-remains on Ativan per CIWA scale-since he continues to have episodes of agitation/confusion-we will start him on low-dose Librium that will need to be tapered off over the next few days.    AKI: Suspicion for hemodynamically mediated kidney injury-no hydronephrosis seen on CT imaging.  No proteinuria on UA.  Renal function seems to be improving-electrolytes  stable-significant urine output overnight.  Did receive 1 session of hemodialysis on 11/30-continue to monitor renal recovery. Continue Foley catheter for now for intake and output monitoring.  Alcoholic hepatitis: Transaminitis improving-acute hepatitis serology was negative-discriminant function around 36-continue prednisolone and monitor LFTs along with INR.   Normocytic anemia: Due to acute illness-AKI-no overt signs of GI bleeding/blood loss-plan to transfuse if hb< 7  Rhabdomyolysis: Due to being down on the floor prior to this hospitalization-CK levels have almost normalized.  Liver cirrhosis/portal hypertension/esophageal varices: Overall stable-continue to hold diuretics-resume beta-blocker once blood pressure stays normal for a few days.    Hepatic encephalopathy: Since mental status is better-and patient not able to take oral intake-we will switch from lactulose enema to oral lactulose.   Alcohol withdrawal EtOH use/alcohol withdrawal: Although with significant improvement-Per nursing staff-he continues to have episodes of agitation and confusion-we will start low-dose Librium and taper over the next few days.    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   Depression: Resume Zoloft over the next few days.  BMI Estimated body mass index is 20.94 kg/m as calculated from the following:   Height as of 07/03/20: 6\' 3"  (1.905 m).   Weight as of this encounter: 76 kg.   Wound care Unstageable Pressure injuries to Bilateral Ankles and Coccyx, present on admission pressure injuries: See below  Pressure Injury 05/13/21 Ankle Right;Lateral Unstageable - Full  thickness tissue loss in which the base of the injury is covered by slough (yellow, tan,  gray, green or brown) and/or eschar (tan, brown or black) in the wound bed. (Active)  05/13/21 0400  Location: Ankle  Location Orientation: Right;Lateral  Staging: Unstageable - Full thickness tissue loss in which the base of the injury is covered by slough (yellow, tan, gray, green or brown) and/or eschar (tan, brown or black) in the wound bed.  Wound Description (Comments):   Present on Admission: Yes     Pressure Injury 05/13/21 Ankle Left;Lateral Unstageable - Full thickness tissue loss in which the base of the injury is covered by slough (yellow, tan, gray, green or brown) and/or eschar (tan, brown or black) in the wound bed. (Active)  05/13/21 0400  Location: Ankle  Location Orientation: Left;Lateral  Staging: Unstageable - Full thickness tissue loss in which the base of the injury is covered by slough (yellow, tan, gray, green or brown) and/or eschar (tan, brown or black) in the wound bed.  Wound Description (Comments):   Present on Admission: Yes     Pressure Injury 05/13/21 Coccyx Unstageable - Full thickness tissue loss in which the base of the injury is covered by slough (yellow, tan, gray, green or brown) and/or eschar (tan, brown or black) in the wound bed. (Active)  05/13/21 0400  Location: Coccyx  Location Orientation:   Staging: Unstageable - Full thickness tissue loss in which the base of the injury is covered by slough (yellow, tan, gray, green or brown) and/or eschar (tan, brown or black) in the wound bed.  Wound Description (Comments):   Present on Admission: Yes     Pressure Injury 05/13/21 Buttocks Left;Lower (Active)  05/13/21 0400  Location: Buttocks  Location Orientation: Left;Lower  Staging:   Wound Description (Comments):   Present on Admission: Yes     Pressure Injury 05/13/21 Buttocks Right (Active)  05/13/21 0400  Location: Buttocks  Location Orientation: Right  Staging:   Wound Description (Comments):   Present on Admission: Yes       Procedures: 11/29>> temporary HD catheter placed by IR. Consults: Renal DVT Prophylaxis: SCD Code Status:Full code  Family Communication: Ex wife-[name] [phone] voicemail on 12/2  Time spent: 25 minutes-Greater than 50% of this time was spent in counseling, explanation of diagnosis, planning of further management, and coordination of care.   Disposition Plan: Status is: Inpatient  Remains inpatient appropriate because: AKI/encephalopathy   Diet: Diet Order             Diet regular Room service appropriate? No; Fluid consistency: Thin  Diet effective now                     Antimicrobial agents: Anti-infectives (From admission, onward)    Start     Dose/Rate Route Frequency Ordered Stop   05/12/21 0715  cefTRIAXone (ROCEPHIN) 2 g in sodium chloride 0.9 % 100 mL IVPB        2 g 200 mL/hr over 30 Minutes Intravenous Every 24 hours 05/12/21 0701          MEDICATIONS: Scheduled Meds:  chlordiazePOXIDE  10 mg Oral TID   Chlorhexidine Gluconate Cloth  6 each Topical Q0600   Chlorhexidine Gluconate Cloth  6 each Topical Q0600   collagenase   Topical Daily   folic acid  1 mg Oral Daily   Gerhardt's butt cream   Topical Daily   lactulose  30 g Oral TID   multivitamin with minerals  1 tablet Oral Daily   pantoprazole  40 mg Oral Daily   prednisoLONE  40 mg Oral Daily   thiamine  100 mg Oral Daily   Or   thiamine  100 mg Intravenous Daily   Continuous Infusions:  cefTRIAXone (ROCEPHIN)  IV 2 g (05/17/21 0625)   dextrose 5 % and 0.2 % NaCl 50 mL/hr at 05/16/21 1550   PRN Meds:.lidocaine-EPINEPHrine, LORazepam   I have personally reviewed following labs and imaging studies  LABORATORY DATA:  Recent Labs  Lab 05/12/21 0053 05/12/21 0522 05/13/21 0318 05/14/21 0209 05/15/21 0711 05/16/21 0226 05/17/21 0540  WBC 13.1* 11.9* 7.9 5.6 6.3 7.8 8.4  HGB 9.8* 9.4* 7.6* 7.4* 7.5* 7.8* 8.2*  HCT 29.6* 28.1* 23.4* 23.0* 22.3* 23.7* 25.1*   PLT 309 257 201 143* 131* 130* 125*  MCV 96.4 95.9 95.5 100.9* 94.9 94.8 95.4  MCH 31.9 32.1 31.0 32.5 31.9 31.2 31.2  MCHC 33.1 33.5 32.5 32.2 33.6 32.9 32.7  RDW 14.2 14.1 14.4 14.3 13.6 13.2 13.4  LYMPHSABS 1.2 1.1  --   --   --   --   --   MONOABS 1.1* 1.2*  --   --   --   --   --   EOSABS 0.0 0.0  --   --   --   --   --   BASOSABS 0.0 0.0  --   --   --   --   --     Recent Labs  Lab 05/12/21 0053 05/12/21 0522 05/13/21 0318 05/14/21 0209 05/15/21 0711 05/16/21 0226 05/17/21 0540  NA 144 146* 143 142 137 136 136  K 5.1 4.6 3.6 3.7 3.5 3.8 4.1  CL 112* 113* 115* 116* 101 103 107  CO2 13* 13* 18* 18* 26 23 22   GLUCOSE 198* 174* 209* 265* 208* 218* 132*  BUN 174* 181* 164* 133* 69* 71* 61*  CREATININE 4.56* 4.84* 4.68* 3.66* 2.41* 2.49* 2.36*  CALCIUM 9.0 9.1 8.0* 7.7* 7.9* 8.1* 8.3*  AST 479* 402* 244* 160* 98* 84* 96*  ALT 231* 217* 175* 156* 132* 129* 130*  ALKPHOS 100 95 88 94 80 96 167*  BILITOT 5.3* 4.5* 2.4* 2.1* 1.6* 1.5* 1.2  ALBUMIN 3.5 3.4* 2.3* 2.2* 2.2* 2.3* 2.3*  LATICACIDVEN  --  1.9 1.4  --   --   --   --   INR  --  1.6*  --   --   --   --   --   HGBA1C  --  5.8*  --   --   --   --   --   AMMONIA 72* 133* 38* 35  --   --   --            RADIOLOGY STUDIES/RESULTS: No results found.   LOS: 5 days   Signature  Lala Lund M.D on 05/17/2021 at 10:31 AM   -  To page go to [email or web address]

## 2021-05-17 NOTE — Progress Notes (Signed)
[facility] Progress Note  Subjective: 1872 in and 1750 cc UOP. 1 L po intake yesterday. B/Cr down to 61/ 2.3 today.    Vitals:   05/16/21 2016 05/17/21 0050 05/17/21 0518 05/17/21 0752  BP: 127/72 121/72 125/78 124/69  Pulse: 78 81 80 82  Resp: 13 17 20 15   Temp: 98.6 F (37 C) 98.5 F (36.9 C) 98.2 F (36.8 C) 98.2 F (36.8 C)  TempSrc: Axillary Oral Oral Oral  SpO2:    97%  Weight:        Exam: Gen is awake now, Ox 2022, [place], "warehouse" No jvd or bruits Chest clear bilat to bases, no rales/ wheezing RRR no MRG Abd soft ntnd no mass or ascites +bs GU normal male w/ foley in place Ext 1+ pedal edema Neuro - awake and nonfocal exam        Home meds include - lasix 40 qd, nadolol 20 qd, zoloft 50, aldactone 100 qd         CT stone study noncon - cirrhosis, ^spleen, GG changes RML/ RLL, normal appearing kidneys bilat no hydro     CXR 11/28 - clear     UNa <10,  UCr 163       UA - >50 rbc, 0-5 wbc, rare bact, neg protein, large Hb       Assessment/ Plan: AKI - in pt w/ hx ETOH abuse and possibly decompensated cirrhosis. No ascites on exam. CT w/o renal obstruction. UA pending. Urine lytes were c/w prerenal. UA showed rbc's only, prob d/t foley trauma. Looked dry on initial exam w/ soft BP's. Suspected ATN from rhabdo and/or vol depletion + hypotension. Got 4L bolus 1st day then further IVF's. Got 1 HD on 11/30 for persistent AMS which improved significantly the next day and has not required further HD.  Creat down slightly to 2.3, early recovery phase of ATN suspected.  Will keep HD cath in another 1-2 day, repeat UNa/ Cr. Will follow.  Hypernatremia - resolved  Vol depletion - resolved ETOH abuse/ alcoholic hepatitis - ^LFT"s improving Cirrhosis/ splenomegaly Confusion/ AMS - seems to be slowly improving, partially oriented, still on CIWA protocol. Per pmd.            Kelly Splinter 05/17/2021, 11:08 AM   Recent Labs  Lab 05/16/21 0226  05/17/21 0540  K 3.8 4.1  BUN 71* 61*  CREATININE 2.49* 2.36*  CALCIUM 8.1* 8.3*  HGB 7.8* 8.2*    Inpatient medications:  chlordiazePOXIDE  10 mg Oral TID   Chlorhexidine Gluconate Cloth  6 each Topical Q0600   Chlorhexidine Gluconate Cloth  6 each Topical Q0600   collagenase   Topical Daily   folic acid  1 mg Oral Daily   Gerhardt's butt cream   Topical Daily   lactulose  30 g Oral TID   multivitamin with minerals  1 tablet Oral Daily   pantoprazole  40 mg Oral Daily   prednisoLONE  40 mg Oral Daily   thiamine  100 mg Oral Daily   Or   thiamine  100 mg Intravenous Daily    cefTRIAXone (ROCEPHIN)  IV 2 g (05/17/21 0625)   dextrose 5 % and 0.2 % NaCl 50 mL/hr at 05/17/21 1054   lidocaine-EPINEPHrine, LORazepam

## 2021-05-17 NOTE — Evaluation (Signed)
Physical Therapy Evaluation Patient Details Name: Martin Anthony MRN: [id number] DOB: [birth year] Today's Date: 05/17/2021  History of Present Illness  55 y.o. male was found on the floor and patient was brought to the ER 05/12/21. Pt admitted for acute renal failure; encephalopathy; rhabdomyolysis; CIWA protocol. PMH alcoholic liver cirrhosis with portal hypertension and esophageal varices; ascending colon mass, paroxysmal atrial fibrillation, hyperglycemia, alcoholism.  Clinical Impression  Pt presents to PT with deficits in functional mobility, gait, balance, coordination, cognition. Pt is very impulsive during session, perseverating on wanting to eat a candy bar. Pt with multiple losses of balance during session and very limited awareness of mobility deficits. PT defers ambulation away from bedside due to high falls risk. PT recommends CIR referral at this time as pt was independent prior to admission and demonstrates the potential to return to independent mobility with high intensity inpatient PT services. Acute PT will continue to follow in an effort to progress to discharge home as the pt is uninsured and placement may be difficult to obtain.     Recommendations for follow up therapy are one component of a multi-disciplinary discharge planning process, led by the attending physician.  Recommendations may be updated based on patient status, additional functional criteria and insurance authorization.  Follow Up Recommendations Acute inpatient rehab (3hours/day)    Assistance Recommended at Discharge Frequent or constant Supervision/Assistance  Functional Status Assessment Patient has had a recent decline in their functional status and demonstrates the ability to make significant improvements in function in a reasonable and predictable amount of time.  Equipment Recommendations   (TBD)    Recommendations for Other Services       Precautions / Restrictions Precautions Precautions:  Fall Precaution Comments: wrist restraints Restrictions Weight Bearing Restrictions: No      Mobility  Bed Mobility Overal bed mobility: Needs Assistance Bed Mobility: Supine to Sit     Supine to sit: Supervision;HOB elevated          Transfers Overall transfer level: Needs assistance Equipment used: None Transfers: Sit to/from Stand Sit to Stand: Min assist           General transfer comment: minA for stability, one loss of balance anteriorly requiring modA to correct soon after sit to stand    Ambulation/Gait Ambulation/Gait assistance: Mod assist   Assistive device: None     Gait velocity interpretation: <1.31 ft/sec, indicative of household ambulator Pre-gait activities: pt marches in place for ~10 steps, gross instability with increased sway, benefits from support to maintain balance    Stairs            Wheelchair Mobility    Modified Rankin (Stroke Patients Only)       Balance Overall balance assessment: Needs assistance Sitting-balance support: Feet supported;No upper extremity supported Sitting balance-Leahy Scale: Poor Sitting balance - Comments: minA for one posterior LOB initially, improves to minG Postural control: Posterior lean Standing balance support: No upper extremity supported Standing balance-Leahy Scale: Poor Standing balance comment: min-modA, one anterior LOB                             Pertinent Vitals/Pain Pain Assessment: 0-10 Pain Score: 10-Worst pain ever Pain Location: both legs Pain Descriptors / Indicators: Aching;Discomfort Pain Intervention(s): Monitored during session    Home Living Family/patient expects to be discharged to:: Private residence Living Arrangements: Non-relatives/Friends (pt reports ex-wife, chart seems to indicate mother may take pt home to [facility]) Available Help  at Discharge: Family;Available PRN/intermittently Type of Home: House Home Access: Stairs to enter Entrance  Stairs-Rails: Right;Left;Can reach both Entrance Stairs-Number of Steps: 15   Home Layout: Two level Home Equipment: Conservation officer, nature (2 wheels);Grab bars - toilet;Grab bars - tub/shower      Prior Function Prior Level of Function : Independent/Modified Independent;Driving             Mobility Comments: pt reports he does not drive, providing conflicting reports between PT and OT sessions       Hand Dominance        Extremity/Trunk Assessment   Upper Extremity Assessment Upper Extremity Assessment: Defer to OT evaluation    Lower Extremity Assessment Lower Extremity Assessment: RLE deficits/detail;LLE deficits/detail RLE Coordination: decreased gross motor LLE Coordination: decreased gross motor       Communication   Communication:  (tangential speech/thought)  Cognition Arousal/Alertness: Awake/alert Behavior During Therapy: Impulsive Overall Cognitive Status: Impaired/Different from baseline Area of Impairment: Orientation;Attention;Memory;Following commands;Safety/judgement;Awareness;Problem solving                 Orientation Level: Disoriented to;Place;Time;Situation Current Attention Level: Focused Memory: Decreased short-term memory;Decreased recall of precautions Following Commands: Follows one step commands with increased time;Follows multi-step commands inconsistently Safety/Judgement: Decreased awareness of safety;Decreased awareness of deficits Awareness: Intellectual Problem Solving: Slow processing;Difficulty sequencing;Requires verbal cues   Functional Status Assessment: Patient has had a recent decline in their functional status and demonstrates the ability to make significant improvements in function in a reasonable and predictable amount of time.      General Comments General comments (skin integrity, edema, etc.): VSS on RA    Exercises     Assessment/Plan    PT Assessment Patient needs continued PT services  PT Problem List  Decreased strength;Decreased activity tolerance;Decreased balance;Decreased mobility;Decreased coordination;Decreased cognition;Decreased knowledge of use of DME;Decreased safety awareness;Decreased knowledge of precautions       PT Treatment Interventions DME instruction;Gait training;Stair training;Functional mobility training;Therapeutic activities;Therapeutic exercise;Balance training;Cognitive remediation;Neuromuscular re-education;Patient/family education    PT Goals (Current goals can be found in the Care Plan section)  Acute Rehab PT Goals Patient Stated Goal: pt unable to set appropriate goal, PT goal to return to independent mobility PT Goal Formulation: Patient unable to participate in goal setting Time For Goal Achievement: 05/31/21 Potential to Achieve Goals: Good    Frequency Min 3X/week   Barriers to discharge        Co-evaluation               AM-PAC PT "6 Clicks" Mobility  Outcome Measure Help needed turning from your back to your side while in a flat bed without using bedrails?: A Little Help needed moving from lying on your back to sitting on the side of a flat bed without using bedrails?: A Little Help needed moving to and from a bed to a chair (including a wheelchair)?: A Lot Help needed standing up from a chair using your arms (e.g., wheelchair or bedside chair)?: A Little Help needed to walk in hospital room?: Total Help needed climbing 3-5 steps with a railing? : Total 6 Click Score: 13    End of Session   Activity Tolerance: Patient tolerated treatment well Patient left: in bed;with call bell/phone within reach (SLP present, wrist restraints left untied) Nurse Communication: Mobility status PT Visit Diagnosis: Other abnormalities of gait and mobility (R26.89);Muscle weakness (generalized) (M62.81);Other symptoms and signs involving the nervous system (R29.898)    Time: 1639-1700 PT Time Calculation (min) (ACUTE ONLY): 21 min  Charges:   PT  Evaluation $PT Eval Moderate Complexity: 1 Mod          Zenaida Niece, PT, DPT Acute Rehabilitation Pager: [phone] Office  05/17/2021, 5:53 PM

## 2021-05-17 NOTE — Evaluation (Signed)
Occupational Therapy Evaluation Patient Details Name: Martin Anthony MRN: [id number] DOB: [birth year] Today's Date: 05/17/2021   History of Present Illness 55 y.o. male was found on the floor and patient was brought to the ER 05/12/21. Pt admitted for acute renal failure; encephalopathy; rhabdomyolysis; CIWA protocol. PMH alcoholic liver cirrhosis with portal hypertension and esophageal varices; ascending colon mass, paroxysmal atrial fibrillation, hyperglycemia, alcoholism.   Clinical Impression   Pt presents with decreased balance, activity tolerance, strength, coordination, and cognition. Pt currently requiring Min A for UB ADLs, Max A for LB ADLs, and Total A for toileting. Also requiring Mod A for functional transfers. Currently requiring frequent cues for safety, sequencing, and efficiency during functional activity. Pt confused and disoriented during eval with no family present to verify Home situation or PLOF, however pt reports independent at baseline and states his ex-wife may be available to provide some degree of assistance at home. Recommend SNF at d/c pending pt progress. Pt would likely need to be supervision level or have frequent assistance available to return home safely due to cognitive and balance deficits present at this time. Will follow acutely to maximize safety/independence with ADLs and functional transfers/mobility.     Recommendations for follow up therapy are one component of a multi-disciplinary discharge planning process, led by the attending physician.  Recommendations may be updated based on patient status, additional functional criteria and insurance authorization.   Follow Up Recommendations  Skilled nursing-short term rehab (<3 hours/day)    Assistance Recommended at Discharge Frequent or constant Supervision/Assistance  Functional Status Assessment  Patient has had a recent decline in their functional status and demonstrates the ability to make significant  improvements in function in a reasonable and predictable amount of time.  Equipment Recommendations  Other (comment) (Defer)    Recommendations for Other Services PT consult     Precautions / Restrictions Precautions Precautions: Fall Precaution Comments: wrist restraints Restrictions Weight Bearing Restrictions: No      Mobility Bed Mobility Overal bed mobility: Needs Assistance Bed Mobility: Supine to Sit;Sit to Supine     Supine to sit: Min guard;HOB elevated Sit to supine: Min guard   General bed mobility comments: Cues for technique and use of rails    Transfers Overall transfer level: Needs assistance Equipment used: Rolling walker (2 wheels) Transfers: Sit to/from Stand Sit to Stand: Mod assist           General transfer comment: Assistance to power up and maintain balance in standing      Balance Overall balance assessment: Needs assistance Sitting-balance support: Bilateral upper extremity supported;Feet supported Sitting balance-Leahy Scale: Fair     Standing balance support: Bilateral upper extremity supported Standing balance-Leahy Scale: Poor                             ADL either performed or assessed with clinical judgement   ADL Overall ADL's : Needs assistance/impaired Eating/Feeding: Set up   Grooming: Set up;Sitting   Upper Body Bathing: Minimal assistance   Lower Body Bathing: Maximal assistance;Sitting/lateral leans   Upper Body Dressing : Minimal assistance;Sitting   Lower Body Dressing: Moderate assistance;Sitting/lateral leans   Toilet Transfer: Moderate assistance;Stand-pivot;BSC/3in1;Rolling walker (2 wheels)   Toileting- Clothing Manipulation and Hygiene: Total assistance               Vision   Vision Assessment?: No apparent visual deficits     Perception     Praxis  Pertinent Vitals/Pain Pain Assessment: Faces Faces Pain Scale: Hurts little more Pain Location: buttocks Pain Descriptors  / Indicators: Discomfort;Grimacing Pain Intervention(s): Monitored during session;Repositioned;Limited activity within patient's tolerance     Hand Dominance     Extremity/Trunk Assessment Upper Extremity Assessment Upper Extremity Assessment: Generalized weakness;RUE deficits/detail;LUE deficits/detail RUE Coordination: decreased fine motor LUE Coordination: decreased fine motor   Lower Extremity Assessment Lower Extremity Assessment: Defer to PT evaluation       Communication Communication Communication: No difficulties   Cognition Arousal/Alertness: Lethargic Behavior During Therapy: Flat affect Overall Cognitive Status: No family/caregiver present to determine baseline cognitive functioning                                 General Comments: Pt A&O x2 to name and place. Slow processing and requiring frequent cues to follow commands correctly.     General Comments       Exercises     Shoulder Instructions      Home Living Family/patient expects to be discharged to:: Private residence Living Arrangements: Non-relatives/Friends (Reports he shares the home with his ex-wife) Available Help at Discharge: Friend(s) Type of Home: House Home Access: Stairs to enter [employer] of Steps: 15 Entrance Stairs-Rails: Right;Left;Can reach both Home Layout: Two level;Able to live on main level with bedroom/bathroom     Bathroom Shower/Tub: Teacher, early years/pre: Standard     Home Equipment: Conservation officer, nature (2 wheels);Grab bars - toilet;Grab bars - tub/shower   Additional Comments: Pt may be poor historian and no family present to confirm home information.      Prior Functioning/Environment Prior Level of Function : Independent/Modified Independent;Driving                        OT Problem List: Decreased strength;Decreased activity tolerance;Impaired balance (sitting and/or standing);Decreased coordination;Decreased  cognition;Decreased safety awareness;Decreased knowledge of use of DME or AE;Decreased knowledge of precautions;Pain      OT Treatment/Interventions: Self-care/ADL training;Therapeutic exercise;Neuromuscular education;Energy conservation;DME and/or AE instruction;Therapeutic activities;Cognitive remediation/compensation;Patient/family education;Balance training    OT Goals(Current goals can be found in the care plan section) Acute Rehab OT Goals Patient Stated Goal: none stated OT Goal Formulation: With patient Time For Goal Achievement: 05/31/21 Potential to Achieve Goals: Good  OT Frequency: Min 2X/week   Barriers to D/C:            Co-evaluation              AM-PAC OT "6 Clicks" Daily Activity     Outcome Measure Help from another person eating meals?: A Little Help from another person taking care of personal grooming?: A Little Help from another person toileting, which includes using toliet, bedpan, or urinal?: Total Help from another person bathing (including washing, rinsing, drying)?: A Lot Help from another person to put on and taking off regular upper body clothing?: A Little Help from another person to put on and taking off regular lower body clothing?: A Lot 6 Click Score: 14   End of Session Equipment Utilized During Treatment: Gait belt;Rolling walker (2 wheels) Nurse Communication: Mobility status  Activity Tolerance: Patient limited by lethargy Patient left: in bed;with call bell/phone within reach;with restraints reapplied  OT Visit Diagnosis: Unsteadiness on feet (R26.81);Other abnormalities of gait and mobility (R26.89);Muscle weakness (generalized) (M62.81);Other symptoms and signs involving cognitive function  Time: 1020-1046 OT Time Calculation (min): 26 min Charges:  OT General Charges $OT Visit: 1 Visit OT Evaluation $OT Eval Moderate Complexity: 1 Mod OT Treatments $Therapeutic Activity: 8-22 mins  Khilynn Borntreger C, OT/L  Acute  Rehab [place] 05/17/2021, 11:22 AM

## 2021-05-17 NOTE — Progress Notes (Signed)
CSW received request to speak with patient's mother regarding discharge needs. CSW spoke with her and stated that it would depend on how patient does once closer to being medically stable. CSW noted that patient does not have insurance which would make planning difficult. Mother is requesting a call (and leave a vm for her to call back since it is a home phone) once final plans are known so that she can plan on taking him home with her in [place] though she would need a day to drive up to get him. CSW will continue to follow.  Gilmore Laroche, MSW, [facility]

## 2021-05-17 NOTE — Evaluation (Signed)
Clinical/Bedside Swallow Evaluation Patient Details  Name: Martin Anthony MRN: [id number] Date of Birth: [birth year]  Today's Date: 05/17/2021 Time: SLP Start Time (ACUTE ONLY): 1700 SLP Stop Time (ACUTE ONLY): 1950 SLP Time Calculation (min) (ACUTE ONLY): 35 min  Past Medical History:  Past Medical History:  Diagnosis Date   Anxiety    Depression    Diabetes mellitus without complication ([place])    GERD (gastroesophageal reflux disease)    Gout    Hyperlipidemia    Hypertension    Substance abuse ([place])    alcoholism    Past Surgical History:  Past Surgical History:  Procedure Laterality Date   BIOPSY  01/19/2019   Procedure: BIOPSY;  Surgeon: Carol Ada, MD;  Location: [facility];  Service: Endoscopy;;   COLONOSCOPY N/A 01/19/2019   Procedure: COLONOSCOPY;  Surgeon: Carol Ada, MD;  Location: [facility];  Service: Endoscopy;  Laterality: N/A;   ESOPHAGOGASTRODUODENOSCOPY (EGD) WITH PROPOFOL N/A 01/19/2019   Procedure: ESOPHAGOGASTRODUODENOSCOPY (EGD) WITH PROPOFOL;  Surgeon: Carol Ada, MD;  Location: [facility];  Service: Endoscopy;  Laterality: N/A;   HEMOSTASIS CLIP PLACEMENT  01/19/2019   Procedure: HEMOSTASIS CLIP PLACEMENT;  Surgeon: Carol Ada, MD;  Location: [facility];  Service: Endoscopy;;   HERNIA REPAIR     umbilical    INGUINAL HERNIA REPAIR Bilateral    IR FLUORO GUIDE CV LINE RIGHT  05/13/2021   IR US GUIDE [place] RIGHT  05/13/2021   POLYPECTOMY  01/19/2019   Procedure: POLYPECTOMY;  Surgeon: Carol Ada, MD;  Location: [facility];  Service: Endoscopy;;   HPI:  55 y.o. male was found on the floor and patient was brought to the ER 05/12/21. Pt admitted for acute renal failure; encephalopathy; rhabdomyolysis; CIWA protocol. PMH alcoholic liver cirrhosis with portal hypertension and esophageal varices; ascending colon mass, paroxysmal atrial fibrillation, hyperglycemia, alcoholism.    Assessment / Plan / Recommendation  Clinical Impression   Patient exhibited one instance of coughing after rapid drinking of ice tea (thin liquid) however despite his continued impulsive eating and drinking, he did not exhibit any further overt s/s aspiration or penetration. Voice remained clear throughout and mastication and oral transit of solids and liquids appeared WNL. Patient is confused, easily distracted, impulsive and fidgety, opening packets of salt, sugar, condiments and putting them on foods without seeming to be aware what they were (putting salt on salad and sugar on pasta). SLP is not recommending further treatment for swallow function at this time as patient appears largely [facility]. SLP Visit Diagnosis: Dysphagia, unspecified (R13.10)    Aspiration Risk  No limitations    Diet Recommendation Regular;Thin liquid   Liquid Administration via: Cup;Straw Medication Administration: Whole meds with liquid Supervision: Patient able to self feed Compensations: Minimize environmental distractions;Small sips/bites;Slow rate Postural Changes: Seated upright at 90 degrees    Other  Recommendations Oral Care Recommendations: Oral care BID    Recommendations for follow up therapy are one component of a multi-disciplinary discharge planning process, led by the attending physician.  Recommendations may be updated based on patient status, additional functional criteria and insurance authorization.  Follow up Recommendations No SLP follow up      Assistance Recommended at Discharge None  Functional Status Assessment Patient has had a recent decline in their functional status and demonstrates the ability to make significant improvements in function in a reasonable and predictable amount of time.  Frequency and Duration            Prognosis   N/A  Swallow Study   General Date of Onset: 05/17/21 HPI: 55 y.o. male was found on the floor and patient was brought to the ER 05/12/21. Pt admitted for acute renal failure; encephalopathy;  rhabdomyolysis; CIWA protocol. PMH alcoholic liver cirrhosis with portal hypertension and esophageal varices; ascending colon mass, paroxysmal atrial fibrillation, hyperglycemia, alcoholism. Type of Study: Bedside Swallow Evaluation Previous Swallow Assessment: none found Diet Prior to this Study: Regular;Thin liquids Temperature Spikes Noted: No Respiratory Status: Room air History of Recent Intubation: No Behavior/Cognition: Alert;Cooperative;Pleasant mood;Confused;Distractible Oral Cavity Assessment: Within Functional Limits Oral Care Completed by SLP: No Oral Cavity - Dentition: Adequate natural dentition Vision: Functional for self-feeding Self-Feeding Abilities: Able to feed self Patient Positioning: Upright in bed Baseline Vocal Quality: Normal Volitional Cough: Other (Comment) (not cued) Volitional Swallow: Able to elicit    Oral/Motor/Sensory Function Overall Oral Motor/Sensory Function: Within functional limits   Ice Chips     Thin Liquid Thin Liquid: Impaired Pharyngeal  Phase Impairments: Cough - Delayed Other Comments: Patient exhibited one instance of coughing after impulsive rapid drinking of iced tea, however  no other overt s/s aspiration or penetration    Nectar Thick     Honey Thick     Puree Puree: Not tested   Solid     Solid: Within functional limits     Sonia Baller, MA, CCC-SLP Speech Therapy

## 2021-05-18 LAB — CBC WITH DIFFERENTIAL/PLATELET
Abs Immature Granulocytes: 0.06 10*3/uL (ref 0.00–0.07)
Basophils Absolute: 0 10*3/uL (ref 0.0–0.1)
Basophils Relative: 0 %
Eosinophils Absolute: 0 10*3/uL (ref 0.0–0.5)
Eosinophils Relative: 0 %
HCT: 25.8 % — ABNORMAL LOW (ref 39.0–52.0)
Hemoglobin: 8.5 g/dL — ABNORMAL LOW (ref 13.0–17.0)
Immature Granulocytes: 1 %
Lymphocytes Relative: 5 %
Lymphs Abs: 0.5 10*3/uL — ABNORMAL LOW (ref 0.7–4.0)
MCH: 31.1 pg (ref 26.0–34.0)
MCHC: 32.9 g/dL (ref 30.0–36.0)
MCV: 94.5 fL (ref 80.0–100.0)
Monocytes Absolute: 0.6 10*3/uL (ref 0.1–1.0)
Monocytes Relative: 6 %
Neutro Abs: 9 10*3/uL — ABNORMAL HIGH (ref 1.7–7.7)
Neutrophils Relative %: 88 %
Platelets: 123 10*3/uL — ABNORMAL LOW (ref 150–400)
RBC: 2.73 MIL/uL — ABNORMAL LOW (ref 4.22–5.81)
RDW: 13.2 % (ref 11.5–15.5)
WBC: 10.1 10*3/uL (ref 4.0–10.5)
nRBC: 0 % (ref 0.0–0.2)

## 2021-05-18 LAB — AMMONIA: Ammonia: 28 umol/L (ref 9–35)

## 2021-05-18 LAB — COMPREHENSIVE METABOLIC PANEL
ALT: 125 U/L — ABNORMAL HIGH (ref 0–44)
AST: 83 U/L — ABNORMAL HIGH (ref 15–41)
Albumin: 2.2 g/dL — ABNORMAL LOW (ref 3.5–5.0)
Alkaline Phosphatase: 201 U/L — ABNORMAL HIGH (ref 38–126)
Anion gap: 7 (ref 5–15)
BUN: 59 mg/dL — ABNORMAL HIGH (ref 6–20)
CO2: 22 mmol/L (ref 22–32)
Calcium: 8.2 mg/dL — ABNORMAL LOW (ref 8.9–10.3)
Chloride: 102 mmol/L (ref 98–111)
Creatinine, Ser: 2.4 mg/dL — ABNORMAL HIGH (ref 0.61–1.24)
GFR, Estimated: 31 mL/min — ABNORMAL LOW (ref >60)
Glucose, Bld: 312 mg/dL — ABNORMAL HIGH (ref 70–99)
Potassium: 4.2 mmol/L (ref 3.5–5.1)
Sodium: 131 mmol/L — ABNORMAL LOW (ref 135–145)
Total Bilirubin: 1.1 mg/dL (ref 0.3–1.2)
Total Protein: 5.8 g/dL — ABNORMAL LOW (ref 6.5–8.1)

## 2021-05-18 LAB — PROTIME-INR
INR: 1.4 — ABNORMAL HIGH (ref 0.8–1.2)
Prothrombin Time: 17.4 seconds — ABNORMAL HIGH (ref 11.4–15.2)

## 2021-05-18 LAB — BRAIN NATRIURETIC PEPTIDE: B Natriuretic Peptide: 58.4 pg/mL (ref 0.0–100.0)

## 2021-05-18 LAB — MAGNESIUM: Magnesium: 2.6 mg/dL — ABNORMAL HIGH (ref 1.7–2.4)

## 2021-05-18 LAB — C-REACTIVE PROTEIN: CRP: 3.6 mg/dL — ABNORMAL HIGH (ref <1.0)

## 2021-05-18 MED ORDER — HYDROMORPHONE HCL 1 MG/ML IJ SOLN
0.5000 mg | INTRAMUSCULAR | Status: DC | PRN
  Administered 2021-05-18: 0.5 mg via INTRAVENOUS
  Filled 2021-05-18: qty 0.5

## 2021-05-18 MED ORDER — LACTATED RINGERS IV SOLN: INTRAVENOUS | Status: AC

## 2021-05-18 NOTE — Progress Notes (Signed)
Pt voided 280 mL this evening since foley catheter was removed this afternoon.

## 2021-05-18 NOTE — Progress Notes (Signed)
Untied pt's wrist restraints so pt could feed himself. Pt pulled out his IV in this timeframe. Restraints placed back on.

## 2021-05-18 NOTE — Progress Notes (Signed)
PROGRESS NOTE        PATIENT DETAILS Name: Martin Anthony Age: 55 y.o. Sex: male Date of Birth: [birth year] Admit Date: 05/11/2021 Admitting Physician Rise Patience, MD PCP:Fry, Ishmael Holter, MD  Brief Narrative: Patient is a 55 y.o. male with history of liver cirrhosis, EtOH use, PAF-who presented with confusion/found down at home (found by ex wife)-patient was found to have acute metabolic encephalopathy in the setting of AKI/hepatic encephalopathy.  Initially monitored at [facility] transferred to [facility] for potential dialysis.  See below for details.  Subjective:  Patient in bed, appears comfortable, denies any headache, no fever, no chest pain or pressure, no shortness of breath , no abdominal pain. No new focal weakness.   Objective: Vitals: Blood pressure 124/72, pulse 76, temperature 98.2 F (36.8 C), temperature source Oral, resp. rate 17, weight 76 kg, SpO2 98 %.   Exam:  Awake but confused, No new F.N deficits, Normal affect [place].AT,PERRAL Supple Neck, No JVD,   Symmetrical Chest wall movement, Good air movement bilaterally, CTAB RRR,No Gallops, Rubs or new Murmurs,  +ve B.Sounds, Abd Soft, No tenderness,   No Cyanosis, Clubbing or edema      Assessment/Plan:  Acute metabolic encephalopathy: Multifactorial-due to uremic encephalopathy, hepatic encephalopathy, possible alcohol withdrawal symptoms.  CT head without any acute abnormalities.  Mental status is now gradually improving-he still has episodes of agitation and confusion.  Renal function continues to improve-on lactulose for hepatic encephalopathy-remains on Ativan per CIWA scale-since he continues to have episodes of agitation/confusion-we will start him on low-dose Librium that will need to be tapered off over the next few days.    AKI: Suspicion for hemodynamically mediated kidney injury-no hydronephrosis seen on CT imaging.  No proteinuria on UA.  Renal function seems to be  improving-electrolytes stable-significant urine output overnight.  Did receive 1 session of hemodialysis on 11/30- Per Renal DC HD Cath and Foley on 05/18/21.  Alcoholic hepatitis: Transaminitis improving-acute hepatitis serology was negative-discriminant function around 36-continue prednisolone and monitor LFTs along with INR.   Normocytic anemia: Due to acute illness-AKI-no overt signs of GI bleeding/blood loss-plan to transfuse if hb< 7  Rhabdomyolysis: Due to being down on the floor prior to this hospitalization-CK levels have almost normalized.  Liver cirrhosis/portal hypertension/esophageal varices: Overall stable-continue to hold diuretics-resume beta-blocker once blood pressure stays normal for a few days.    Hepatic encephalopathy: Since mental status is better-and patient not able to take oral intake-we will switch from lactulose enema to oral lactulose.   Alcohol withdrawal EtOH use/alcohol withdrawal: Although with significant improvement-Per nursing staff-he continues to have episodes of agitation and confusion-we will start low-dose Librium and taper over the next few days.    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   Depression: Resume Zoloft over the next few days.  BMI Estimated body mass index is 20.94 kg/m as calculated from the following:   Height as of 07/03/20: 6\' 3"  (1.905 m).   Weight as of this encounter: 76 kg.   Wound care Unstageable Pressure injuries to Bilateral Ankles and Coccyx, present on admission pressure injuries: See below  Pressure Injury 05/13/21 Ankle Right;Lateral Unstageable - Full thickness tissue loss  in which the base of the injury is covered by slough (yellow, tan, gray, green or brown) and/or  eschar (tan, brown or black) in the wound bed. (Active)  05/13/21 0400  Location: Ankle  Location Orientation: Right;Lateral  Staging: Unstageable - Full thickness tissue loss in which the base of the injury is covered by slough (yellow, tan, gray, green or brown) and/or eschar (tan, brown or black) in the wound bed.  Wound Description (Comments):   Present on Admission: Yes     Pressure Injury 05/13/21 Ankle Left;Lateral Unstageable - Full thickness tissue loss in which the base of the injury is covered by slough (yellow, tan, gray, green or brown) and/or eschar (tan, brown or black) in the wound bed. (Active)  05/13/21 0400  Location: Ankle  Location Orientation: Left;Lateral  Staging: Unstageable - Full thickness tissue loss in which the base of the injury is covered by slough (yellow, tan, gray, green or brown) and/or eschar (tan, brown or black) in the wound bed.  Wound Description (Comments):   Present on Admission: Yes     Pressure Injury 05/13/21 Coccyx Unstageable - Full thickness tissue loss in which the base of the injury is covered by slough (yellow, tan, gray, green or brown) and/or eschar (tan, brown or black) in the wound bed. (Active)  05/13/21 0400  Location: Coccyx  Location Orientation:   Staging: Unstageable - Full thickness tissue loss in which the base of the injury is covered by slough (yellow, tan, gray, green or brown) and/or eschar (tan, brown or black) in the wound bed.  Wound Description (Comments):   Present on Admission: Yes     Pressure Injury 05/13/21 Buttocks Left;Lower (Active)  05/13/21 0400  Location: Buttocks  Location Orientation: Left;Lower  Staging:   Wound Description (Comments):   Present on Admission: Yes     Pressure Injury 05/13/21 Buttocks Right (Active)  05/13/21 0400  Location: Buttocks  Location Orientation: Right  Staging:   Wound Description (Comments):   Present on Admission: Yes      Procedures: 11/29>> temporary HD  catheter placed by IR. Consults: Renal DVT Prophylaxis: SCD Code Status:Full code  Family Communication: Ex wife-[name] [phone] voicemail on 12/2, mother 05/17/21  Time spent: 25 minutes-Greater than 50% of this time was spent in counseling, explanation of diagnosis, planning of further management, and coordination of care.   Disposition Plan: Status is: Inpatient  Remains inpatient appropriate because: AKI/encephalopathy   Diet: Diet Order             Diet regular Room service appropriate? No; Fluid consistency: Thin  Diet effective now                     Antimicrobial agents: Anti-infectives (From admission, onward)    Start     Dose/Rate Route Frequency Ordered Stop   05/12/21 0715  cefTRIAXone (ROCEPHIN) 2 g in sodium chloride 0.9 % 100 mL IVPB        2 g 200 mL/hr over 30 Minutes Intravenous Every 24 hours 05/12/21 0701          MEDICATIONS: Scheduled Meds:  chlordiazePOXIDE  10 mg Oral TID   Chlorhexidine Gluconate Cloth  6 each Topical Q0600   Chlorhexidine Gluconate Cloth  6 each Topical Q0600   collagenase   Topical Daily   folic acid  1 mg Oral Daily   Gerhardt's butt cream   Topical Daily   lactulose  30 g Oral TID   multivitamin with minerals  1 tablet Oral Daily   pantoprazole  40 mg Oral Daily   prednisoLONE  40 mg Oral Daily   thiamine  100 mg Oral Daily   Or   thiamine  100 mg Intravenous Daily   Continuous Infusions:  cefTRIAXone (ROCEPHIN)  IV Stopped (05/18/21 0705)   lactated ringers     PRN Meds:.lidocaine-EPINEPHrine, LORazepam   I have personally reviewed following labs and imaging studies  LABORATORY DATA:  Recent Labs  Lab 05/12/21 0053 05/12/21 0522 05/13/21 0318 05/14/21 0209 05/15/21 0711 05/16/21 0226 05/17/21 0540 05/18/21 0023  WBC 13.1* 11.9*   < > 5.6 6.3 7.8 8.4 10.1  HGB 9.8* 9.4*   < > 7.4* 7.5* 7.8* 8.2* 8.5*  HCT 29.6* 28.1*   < > 23.0* 22.3* 23.7* 25.1* 25.8*  PLT 309 257   < > 143*  131* 130* 125* 123*  MCV 96.4 95.9   < > 100.9* 94.9 94.8 95.4 94.5  MCH 31.9 32.1   < > 32.5 31.9 31.2 31.2 31.1  MCHC 33.1 33.5   < > 32.2 33.6 32.9 32.7 32.9  RDW 14.2 14.1   < > 14.3 13.6 13.2 13.4 13.2  LYMPHSABS 1.2 1.1  --   --   --   --   --  0.5*  MONOABS 1.1* 1.2*  --   --   --   --   --  0.6  EOSABS 0.0 0.0  --   --   --   --   --  0.0  BASOSABS 0.0 0.0  --   --   --   --   --  0.0   < > = values in this interval not displayed.    Recent Labs  Lab 05/12/21 0053 05/12/21 0522 05/13/21 0318 05/14/21 0209 05/15/21 0711 05/16/21 0226 05/17/21 0540 05/18/21 0023  NA 144 146* 143 142 137 136 136 131*  K 5.1 4.6 3.6 3.7 3.5 3.8 4.1 4.2  CL 112* 113* 115* 116* 101 103 107 102  CO2 13* 13* 18* 18* 26 23 22 22   GLUCOSE 198* 174* 209* 265* 208* 218* 132* 312*  BUN 174* 181* 164* 133* 69* 71* 61* 59*  CREATININE 4.56* 4.84* 4.68* 3.66* 2.41* 2.49* 2.36* 2.40*  CALCIUM 9.0 9.1 8.0* 7.7* 7.9* 8.1* 8.3* 8.2*  AST 479* 402* 244* 160* 98* 84* 96* 83*  ALT 231* 217* 175* 156* 132* 129* 130* 125*  ALKPHOS 100 95 88 94 80 96 167* 201*  BILITOT 5.3* 4.5* 2.4* 2.1* 1.6* 1.5* 1.2 1.1  ALBUMIN 3.5 3.4* 2.3* 2.2* 2.2* 2.3* 2.3* 2.2*  MG  --   --   --   --   --   --   --  2.6*  CRP  --   --   --   --   --   --   --  3.6*  LATICACIDVEN  --  1.9 1.4  --   --   --   --   --   INR  --  1.6*  --   --   --   --   --  1.4*  HGBA1C  --  5.8*  --   --   --   --   --   --   AMMONIA 72* 133* 38* 35  --   --   --  28  BNP  --   --   --   --   --   --   --  58.4       RADIOLOGY STUDIES/RESULTS: No results found.   LOS: 6 days   Signature  Lala Lund M.D on 05/18/2021 at 11:41 AM   -  To page go to [email or web address]

## 2021-05-18 NOTE — Progress Notes (Signed)
[facility] Progress Note  Subjective: 875 cc UOP yesterday  Vitals:   05/17/21 2000 05/18/21 0000 05/18/21 0400 05/18/21 0820  BP: 135/77 118/68 114/63 124/72  Pulse: 87   76  Resp: 20 11 19 17   Temp: 98.9 F (37.2 C) 98.4 F (36.9 C) 98.1 F (36.7 C) 98.2 F (36.8 C)  TempSrc:  Oral Oral Oral  SpO2: 99% 100% 100% 98%  Weight:        Exam: Gen is awake now, Ox 2022, [place], "hospital", 2022 No jvd or bruits Chest clear bilat to bases, no rales/ wheezing RRR no MRG Abd soft ntnd no mass or ascites +bs GU normal male w/ foley in place Ext 1+ pedal edema Neuro - awake and nonfocal exam        Home meds include - lasix 40 qd, nadolol 20 qd, zoloft 50, aldactone 100 qd         CT stone study noncon - cirrhosis, ^spleen, GG changes RML/ RLL, normal appearing kidneys bilat no hydro     CXR 11/28 - clear     UNa <10,  UCr 163       UA - >50 rbc, 0-5 wbc, rare bact, neg protein, large Hb       Assessment/ Plan: AKI - in pt w/ hx ETOH abuse and possibly decompensated cirrhosis. No ascites on exam. CT w/o renal obstruction. UA pending. Urine lytes were c/w prerenal. UA showed rbc's only, prob d/t foley trauma. Looked dry on initial exam w/ soft BP's. Suspected ATN from rhabdo and/or vol depletion + hypotension. Hepatorenal syndrome other possibility (low UNa, cirrhosis w/ portal hypertension). Got 4L bolus 1st day then further IVF's. Got 1 HD on 11/30 for persistent AMS which improved significantly the next day and has not required further HD.  Peak creat 4.8. Creat unchanged at 2.4 today, good UOP.  Fully oriented today. Will dc temp HD cath and foley catheter.  IVF"s were dc'd. Pt is eating and drinking. No HRS protocol since not hypotensive. Will follow.  Hypernatremia - resolved  Vol depletion - resolved, euvolemic on exam ETOH abuse/ alcoholic hepatitis - ^LFT"s improving Cirrhosis/ splenomegaly Confusion/ AMS - slowly improving, fully oriented today. Per  pmd.            Kelly Splinter 05/18/2021, 10:26 AM   Recent Labs  Lab 05/17/21 0540 05/18/21 0023  K 4.1 4.2  BUN 61* 59*  CREATININE 2.36* 2.40*  CALCIUM 8.3* 8.2*  HGB 8.2* 8.5*    Inpatient medications:  chlordiazePOXIDE  10 mg Oral TID   Chlorhexidine Gluconate Cloth  6 each Topical Q0600   Chlorhexidine Gluconate Cloth  6 each Topical Q0600   collagenase   Topical Daily   folic acid  1 mg Oral Daily   Gerhardt's butt cream   Topical Daily   lactulose  30 g Oral TID   multivitamin with minerals  1 tablet Oral Daily   pantoprazole  40 mg Oral Daily   prednisoLONE  40 mg Oral Daily   thiamine  100 mg Oral Daily   Or   thiamine  100 mg Intravenous Daily    cefTRIAXone (ROCEPHIN)  IV 2 g (05/18/21 0620)   lidocaine-EPINEPHrine, LORazepam

## 2021-05-18 NOTE — Progress Notes (Signed)
Inpatient Rehab Admissions Coordinator Note:   Per PT patient was screened for CIR candidacy by Estle Huguley Danford Bad, CCC-SLP. At this time, pt appears to be a potential candidate for CIR. I will place an order for rehab consult for full assessment, per our protocol.  Please contact me any with questions.Gayland Curry, [place], [place] Admissions Coordinator [phone] 05/18/21 4:10 PM

## 2021-05-19 ENCOUNTER — Inpatient Hospital Stay (HOSPITAL_COMMUNITY): Payer: Self-pay

## 2021-05-19 ENCOUNTER — Other Ambulatory Visit: Payer: Self-pay

## 2021-05-19 HISTORY — PX: IR PARACENTESIS: IMG2679

## 2021-05-19 LAB — CBC WITH DIFFERENTIAL/PLATELET
Abs Immature Granulocytes: 0.04 10*3/uL (ref 0.00–0.07)
Basophils Absolute: 0 10*3/uL (ref 0.0–0.1)
Basophils Relative: 0 %
Eosinophils Absolute: 0 10*3/uL (ref 0.0–0.5)
Eosinophils Relative: 0 %
HCT: 24.4 % — ABNORMAL LOW (ref 39.0–52.0)
Hemoglobin: 7.7 g/dL — ABNORMAL LOW (ref 13.0–17.0)
Immature Granulocytes: 0 %
Lymphocytes Relative: 5 %
Lymphs Abs: 0.5 10*3/uL — ABNORMAL LOW (ref 0.7–4.0)
MCH: 30.7 pg (ref 26.0–34.0)
MCHC: 31.6 g/dL (ref 30.0–36.0)
MCV: 97.2 fL (ref 80.0–100.0)
Monocytes Absolute: 0.5 10*3/uL (ref 0.1–1.0)
Monocytes Relative: 5 %
Neutro Abs: 9.4 10*3/uL — ABNORMAL HIGH (ref 1.7–7.7)
Neutrophils Relative %: 90 %
Platelets: 117 10*3/uL — ABNORMAL LOW (ref 150–400)
RBC: 2.51 MIL/uL — ABNORMAL LOW (ref 4.22–5.81)
RDW: 13.4 % (ref 11.5–15.5)
WBC: 10.4 10*3/uL (ref 4.0–10.5)
nRBC: 0 % (ref 0.0–0.2)

## 2021-05-19 LAB — COMPREHENSIVE METABOLIC PANEL
ALT: 127 U/L — ABNORMAL HIGH (ref 0–44)
AST: 93 U/L — ABNORMAL HIGH (ref 15–41)
Albumin: 2.1 g/dL — ABNORMAL LOW (ref 3.5–5.0)
Alkaline Phosphatase: 249 U/L — ABNORMAL HIGH (ref 38–126)
Anion gap: 7 (ref 5–15)
BUN: 55 mg/dL — ABNORMAL HIGH (ref 6–20)
CO2: 20 mmol/L — ABNORMAL LOW (ref 22–32)
Calcium: 8.1 mg/dL — ABNORMAL LOW (ref 8.9–10.3)
Chloride: 103 mmol/L (ref 98–111)
Creatinine, Ser: 2.64 mg/dL — ABNORMAL HIGH (ref 0.61–1.24)
GFR, Estimated: 28 mL/min — ABNORMAL LOW (ref >60)
Glucose, Bld: 342 mg/dL — ABNORMAL HIGH (ref 70–99)
Potassium: 4.7 mmol/L (ref 3.5–5.1)
Sodium: 130 mmol/L — ABNORMAL LOW (ref 135–145)
Total Bilirubin: 1.1 mg/dL (ref 0.3–1.2)
Total Protein: 5.5 g/dL — ABNORMAL LOW (ref 6.5–8.1)

## 2021-05-19 LAB — BODY FLUID CELL COUNT WITH DIFFERENTIAL
Eos, Fluid: 0 %
Lymphs, Fluid: 5 %
Monocyte-Macrophage-Serous Fluid: 21 % — ABNORMAL LOW (ref 50–90)
Neutrophil Count, Fluid: 74 % — ABNORMAL HIGH (ref 0–25)
Total Nucleated Cell Count, Fluid: 98 cu mm (ref 0–1000)

## 2021-05-19 LAB — BRAIN NATRIURETIC PEPTIDE: B Natriuretic Peptide: 31.6 pg/mL (ref 0.0–100.0)

## 2021-05-19 LAB — C-REACTIVE PROTEIN: CRP: 2.8 mg/dL — ABNORMAL HIGH (ref <1.0)

## 2021-05-19 LAB — AMMONIA: Ammonia: 50 umol/L — ABNORMAL HIGH (ref 9–35)

## 2021-05-19 LAB — GRAM STAIN

## 2021-05-19 LAB — ALBUMIN, PLEURAL OR PERITONEAL FLUID: Albumin, Fluid: <1.5 g/dL

## 2021-05-19 LAB — GLUCOSE, CAPILLARY
Glucose-Capillary: 403 mg/dL — ABNORMAL HIGH (ref 70–99)
Glucose-Capillary: 356 mg/dL — ABNORMAL HIGH (ref 70–99)
Glucose-Capillary: 341 mg/dL — ABNORMAL HIGH (ref 70–99)
Glucose-Capillary: 331 mg/dL — ABNORMAL HIGH (ref 70–99)

## 2021-05-19 LAB — GLUCOSE, PLEURAL OR PERITONEAL FLUID: Glucose, Fluid: 418 mg/dL

## 2021-05-19 LAB — MAGNESIUM: Magnesium: 2.4 mg/dL (ref 1.7–2.4)

## 2021-05-19 LAB — PROTIME-INR
INR: 1.3 — ABNORMAL HIGH (ref 0.8–1.2)
Prothrombin Time: 16.1 seconds — ABNORMAL HIGH (ref 11.4–15.2)

## 2021-05-19 LAB — MRSA NEXT GEN BY PCR, NASAL: MRSA by PCR Next Gen: NOT DETECTED

## 2021-05-19 MED ORDER — INSULIN ASPART 100 UNIT/ML IJ SOLN
0.0000 [IU] | Freq: Three times a day (TID) | INTRAMUSCULAR | Status: DC
  Administered 2021-05-19: 7 [IU] via SUBCUTANEOUS
  Administered 2021-05-19: 9 [IU] via SUBCUTANEOUS
  Administered 2021-05-20: 3 [IU] via SUBCUTANEOUS
  Administered 2021-05-20: 5 [IU] via SUBCUTANEOUS
  Administered 2021-05-20: 3 [IU] via SUBCUTANEOUS
  Administered 2021-05-21: 1 [IU] via SUBCUTANEOUS
  Administered 2021-05-21: 5 [IU] via SUBCUTANEOUS

## 2021-05-19 MED ORDER — LIDOCAINE HCL 1 % IJ SOLN
INTRAMUSCULAR | Status: AC
  Administered 2021-05-19: 8 mL
  Filled 2021-05-19: qty 20

## 2021-05-19 MED ORDER — ALBUMIN HUMAN 25 % IV SOLN
50.0000 g | Freq: Once | INTRAVENOUS | Status: AC | PRN
  Administered 2021-05-19: 50 g via INTRAVENOUS
  Filled 2021-05-19: qty 200

## 2021-05-19 MED ORDER — CHLORDIAZEPOXIDE HCL 5 MG PO CAPS
5.0000 mg | ORAL_CAPSULE | Freq: Two times a day (BID) | ORAL | Status: DC
  Administered 2021-05-19 – 2021-05-20 (×3): 5 mg via ORAL
  Filled 2021-05-19 (×3): qty 1

## 2021-05-19 MED ORDER — INSULIN ASPART 100 UNIT/ML IJ SOLN
5.0000 [IU] | Freq: Once | INTRAMUSCULAR | Status: AC
Start: 2021-05-19 — End: 2021-05-19
  Administered 2021-05-19: 5 [IU] via SUBCUTANEOUS

## 2021-05-19 MED ORDER — RIFAXIMIN 550 MG PO TABS
550.0000 mg | ORAL_TABLET | Freq: Two times a day (BID) | ORAL | Status: DC
  Administered 2021-05-19 – 2021-05-21 (×5): 550 mg via ORAL
  Filled 2021-05-19 (×5): qty 1

## 2021-05-19 NOTE — Procedures (Signed)
PROCEDURE SUMMARY:  Successful image-guided paracentesis from the right lower abdomen.  Yielded 1.3 liters of pale hazy yellow fluid.  No immediate complications.  EBL = trace. Patient tolerated well.   Specimen was  sent for labs.  Please see imaging section of Epic for full dictation.   Armando Gang Dana Debo PA-C 05/19/2021 4:46 PM

## 2021-05-19 NOTE — Progress Notes (Signed)
Inpatient Rehabilitation Admissions Coordinator   I will not have a CIR bed to admit him tomorrow per clarification request of TOC. I will follow up with his therapy progress if he remains in house to further assess for his rehab needs.  Danne Baxter, RN, MSN Rehab Admissions Coordinator [phone] 05/19/2021 2:23 PM

## 2021-05-19 NOTE — Progress Notes (Signed)
[facility] Progress Note  Subjective: no acute events. Uop ~1.6L. he reports feeling very weak.  Vitals:   05/18/21 2338 05/19/21 0031 05/19/21 0400 05/19/21 0811  BP: (!) 98/53 (!) 153/90 116/65 106/64  Pulse: 81 85 67 74  Resp: '14  16 14  ' Temp: 97.9 F (36.6 C) 98.3 F (36.8 C) 98 F (36.7 C) 97.7 F (36.5 C)  TempSrc: Oral Oral Oral Oral  SpO2: 97% 100% 100% 96%  Weight:   75.7 kg   Height:        Exam: Gen: NAD, laying flat in bed Chest clear bilat to bases, no rales/ wheezing RRR no MRG Abd soft ntnd no mass or ascites +bs GU normal male w/ foley in place Ext: trace pedal edema Neuro - awake and nonfocal exam        Home meds include - lasix 40 qd, nadolol 20 qd, zoloft 50, aldactone 100 qd         CT stone study noncon - cirrhosis, ^spleen, GG changes RML/ RLL, normal appearing kidneys bilat no hydro     CXR 11/28 - clear     UNa <10,  UCr 163       UA - >50 rbc, 0-5 wbc, rare bact, neg protein, large Hb      Recent Labs  Lab 05/18/21 0023 05/19/21 0147  K 4.2 4.7  BUN 59* 55*  CREATININE 2.40* 2.64*  CALCIUM 8.2* 8.1*  HGB 8.5* 7.7*   Inpatient medications:  chlordiazePOXIDE  10 mg Oral TID   collagenase   Topical Daily   folic acid  1 mg Oral Daily   Gerhardt's butt cream   Topical Daily   lactulose  30 g Oral TID   multivitamin with minerals  1 tablet Oral Daily   pantoprazole  40 mg Oral Daily   prednisoLONE  40 mg Oral Daily   rifaximin  550 mg Oral BID   thiamine  100 mg Oral Daily   Or   thiamine  100 mg Intravenous Daily    cefTRIAXone (ROCEPHIN)  IV 2 g (05/19/21 0739)   lactated ringers 50 mL/hr at 05/19/21 0658   lidocaine-EPINEPHrine, LORazepam       Assessment/ Plan: AKI - in pt w/ hx ETOH abuse and possibly decompensated cirrhosis. No ascites on exam. CT w/o renal obstruction. UA pending. Urine lytes were c/w prerenal. UA showed rbc's only, prob d/t foley trauma. Looked dry on initial exam w/ soft BP's.  Suspected ATN from rhabdo and/or vol depletion + hypotension. Hepatorenal syndrome other possibility (low UNa, cirrhosis w/ portal hypertension). Got 4L bolus 1st day then further IVF's. Got 1 HD on 11/30 for persistent AMS which improved significantly the next day and has not required further HD.  Peak creat 4.8. Cr relatively stable today, 2.6 (2.4 yesterday). Suspecting slight bump in Cr may be prerenal drive given marked hyperglycemia on met panel yesterday and today. Encouraged him to stay hydrated with water today, if no improvement/wosening Cr tomorrow then would recommend a bolus of isotonic fluids. Currently on low rate LR 50cc/hr Hypernatremia - resolved. Now hyponatremic--correct Na for hyperglycemia around 134 Vol depletion - resolved, relatively euvolemic on exam ETOH abuse/ alcoholic hepatitis - ^LFT"s improving Cirrhosis/ splenomegaly Confusion/ AMS - slowly improving, fully oriented today. Per pmd.  Hyperglycemia-likely driven by steroids. Recommend checking BG. Per primary   Gean Quint, MD [facility]

## 2021-05-19 NOTE — Progress Notes (Signed)
   05/19/21 0031  What Happened  Was fall witnessed? No  Was patient injured? Unsure  Patient found on floor  Found by Staff-comment Wenda Overland)  Stated prior activity other (comment) ("trying to clean up poop")  Follow Up  MD notified Dr. Tonie Griffith  Time MD notified [phone]  Family notified Yes - comment (Mother, Mahlon Gammon.)  Time family notified 0740  Additional tests Yes-comment (CT head ordered.)  Progress note created (see row info) Yes  Vitals  Temp 98.3 F (36.8 C)  Temp Source Oral  BP (!) 153/90  MAP (mmHg) 107  BP Location Left Arm  BP Method Automatic  Patient Position (if appropriate) Lying  Pulse Rate 85  Pulse Rate Source Monitor;Dinamap  ECG Heart Rate 85  Oxygen Therapy  SpO2 100 %  O2 Device Room Air  Patient Activity (if Appropriate) Other (Comment) (Patient supine on the floor post fall.)  Pulse Oximetry Type Continuous  Pain Assessment  Pain Scale 0-10  Pain Score 0  Neurological  Level of Consciousness Alert

## 2021-05-19 NOTE — TOC Progression Note (Addendum)
Transition of Care [facility]) - Progression Note    Patient Details  Name: ROCKWELL ZENTZ MRN: [id number] Date of Birth: 1965-06-19  Transition of Care [facility]) CM/SW Contact  Cyndi Bender, RN Phone Number: 05/19/2021, 4:16 PM  Clinical Narrative:     Orders for DME and HH-PT/OT.   Patient is going to [place] to live with his mother.  I spoke to [facility] with [employer] and ordered 3&1 and walker.  Match letter done. Orders for [facility] but since patient is going out of this area he can't get [place].Mother is aware.   I called to find out how much therapies would be out of pocket and left message on  mother's answering machine I also left message that MD will decide in the morning if patient can be discharged.  Dr. Candiss Norse is aware that patient won't get Home health due to no insurance. Dr. Candiss Norse will make the decision if he wants to discharge patient in the morning and he will notify the mother.     Expected Discharge Plan: Home/Self Care (home with mother) Barriers to Discharge: Continued Medical Work up  Expected Discharge Plan and Services Expected Discharge Plan: Home/Self Care (home with mother)   Discharge Planning Services: CM Consult Post Acute Care Choice: Durable Medical Equipment, Home Health Living arrangements for the past 2 months: Single Family Home                 DME Arranged: 3-N-1, Walker rolling DME Agency: [employer] Date DME Agency Contacted: 05/19/21 Time DME Agency Contacted: [phone] Representative spoke with at DME Agency: Bronwen Betters             Social Determinants of Health ([place]) Interventions    Readmission Risk Interventions No flowsheet data found.

## 2021-05-19 NOTE — Progress Notes (Signed)
PT Cancellation Note  Patient Details Name: Martin Anthony MRN: [id number] DOB: Jan 17, 1966   Cancelled Treatment:    Reason Eval/Treat Not Completed: Patient at procedure or test/unavailable  Notified MD was looking for updated PT notes for this patient. As initiating PT session, transport arrived to take pt to IR. Will plan to see in a.m. 05/20/21    Arby Barrette, [place]  Pager [phone] Office [phone]   Rexanne Mano 05/19/2021, 3:29 PM

## 2021-05-19 NOTE — Progress Notes (Signed)
PROGRESS NOTE        PATIENT DETAILS Name: Martin Anthony Age: 55 y.o. Sex: male Date of Birth: 04-21-1966 Admit Date: 05/11/2021 Admitting Physician Martin Patience, MD PCP:Fry, Ishmael Holter, MD  Brief Narrative: Patient is a 55 y.o. male with history of liver cirrhosis, EtOH use, PAF-who presented with confusion/found down at home (found by ex wife)-patient was found to have acute metabolic encephalopathy in the setting of AKI/hepatic encephalopathy.  Initially monitored at [facility] transferred to [facility] for potential dialysis.  See below for details.  Subjective:  Patient in bed, appears comfortable, denies any headache, no fever, no chest pain or pressure, no shortness of breath , no abdominal pain. No new focal weakness.    Objective: Vitals: Blood pressure 106/64, pulse 74, temperature 97.7 F (36.5 C), temperature source Oral, resp. rate 14, height 6\' 3"  (1.905 m), weight 75.7 kg, SpO2 96 %.   Exam:  Awake Alert, No new F.N deficits, Normal affect [place].AT,PERRAL Supple Neck, No JVD,   Symmetrical Chest wall movement, Good air movement bilaterally, CTAB RRR,No Gallops, Rubs or new Murmurs,  +ve B.Sounds, Abd Soft, No tenderness,   No Cyanosis, Clubbing or edema    Assessment/Plan:  Acute metabolic encephalopathy: Multifactorial-due to uremic encephalopathy, hepatic encephalopathy, possible alcohol withdrawal symptoms.  CT head without any acute abnormalities.  Mental status is now gradually improving-is now close to baseline.  Taper off sedative medications  AKI: Suspicion for hemodynamically mediated kidney injury-no hydronephrosis seen on CT imaging.  No proteinuria on UA.  Renal function seems to be improving-electrolytes stable-significant urine output overnight.  Did receive 1 session of hemodialysis on 11/30- Per Renal DC HD Cath and Foley on 05/18/21.  Alcoholic hepatitis: Transaminitis improving-acute hepatitis serology was  negative-discriminant factor close to 5.7.  Stop steroids.  Will check if he he has enough fluid for ultrasound-guided paracentesis, if radiology is able to obtain some fluid we will order appropriate lab studies to rule out SBP.  For now continue empiric antibiotic.  Normocytic anemia: Due to acute illness-AKI-no overt signs of GI bleeding/blood loss-plan to transfuse if hb< 7  Rhabdomyolysis: Due to being down on the floor prior to this hospitalization-CK levels have almost normalized.  Liver cirrhosis/portal hypertension/esophageal varices: Overall stable-continue to hold diuretics-resume beta-blocker once blood pressure stays normal for a few days.    Hepatic encephalopathy: Since mental status is better-and patient not able to take oral intake-we will switch from lactulose enema to oral lactulose.   Alcohol withdrawal EtOH use/alcohol withdrawal: Improved now and mentation  is close to baseline now.   History of PA Is close to baseline.  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   Depression: Resume Zoloft over the next few days.  BMI Estimated body mass index is 20.86 kg/m as calculated from the following:   Height as of this encounter: 6\' 3"  (1.905 m).   Weight as of this encounter: 75.7 kg.   Wound care Unstageable Pressure injuries to Bilateral Ankles and Coccyx, present on admission pressure injuries: See below  Pressure Injury 05/13/21 Ankle Right;Lateral Unstageable - Full thickness tissue loss in which the base of the injury is covered by slough (yellow, tan, gray,  green or brown) and/or eschar (tan, brown or black) in the wound bed. (Active)  05/13/21 0400  Location: Ankle  Location Orientation: Right;Lateral  Staging: Unstageable - Full  thickness tissue loss in which the base of the injury is covered by slough (yellow, tan, gray, green or brown) and/or eschar (tan, brown or black) in the wound bed.  Wound Description (Comments):   Present on Admission: Yes     Pressure Injury 05/13/21 Ankle Left;Lateral Unstageable - Full thickness tissue loss in which the base of the injury is covered by slough (yellow, tan, gray, green or brown) and/or eschar (tan, brown or black) in the wound bed. (Active)  05/13/21 0400  Location: Ankle  Location Orientation: Left;Lateral  Staging: Unstageable - Full thickness tissue loss in which the base of the injury is covered by slough (yellow, tan, gray, green or brown) and/or eschar (tan, brown or black) in the wound bed.  Wound Description (Comments):   Present on Admission: Yes     Pressure Injury 05/13/21 Coccyx Unstageable - Full thickness tissue loss in which the base of the injury is covered by slough (yellow, tan, gray, green or brown) and/or eschar (tan, brown or black) in the wound bed. (Active)  05/13/21 0400  Location: Coccyx  Location Orientation:   Staging: Unstageable - Full thickness tissue loss in which the base of the injury is covered by slough (yellow, tan, gray, green or brown) and/or eschar (tan, brown or black) in the wound bed.  Wound Description (Comments):   Present on Admission: Yes     Pressure Injury 05/13/21 Buttocks Left;Lower (Active)  05/13/21 0400  Location: Buttocks  Location Orientation: Left;Lower  Staging:   Wound Description (Comments):   Present on Admission: Yes     Pressure Injury 05/13/21 Buttocks Right (Active)  05/13/21 0400  Location: Buttocks  Location Orientation: Right  Staging:   Wound Description (Comments):   Present on Admission: Yes      Procedures: 11/29>> temporary HD catheter placed by IR. Consults: Renal DVT Prophylaxis: SCD Code Status:Full code  Family Communication: Ex wife-Martin Anthony [phone] voicemail on  12/2, mother 05/17/21  Time spent: 25 minutes-Greater than 50% of this time was spent in counseling, explanation of diagnosis, planning of further management, and coordination of care.   Disposition Plan: Status is: Inpatient  Remains inpatient appropriate because: AKI/encephalopathy   Diet: Diet Order             Diet regular Room service appropriate? No; Fluid consistency: Thin  Diet effective now                     Antimicrobial agents: Anti-infectives (From admission, onward)    Start     Dose/Rate Route Frequency Ordered Stop   05/19/21 1000  rifaximin (XIFAXAN) tablet 550 mg        550 mg Oral 2 times daily 05/19/21 0600     05/12/21 0715  cefTRIAXone (ROCEPHIN) 2 g in sodium chloride 0.9 % 100 mL IVPB        2 g 200 mL/hr over 30 Minutes Intravenous Every 24 hours 05/12/21 0701          MEDICATIONS: Scheduled Meds:  chlordiazePOXIDE  10 mg Oral TID   collagenase   Topical Daily   folic acid  1 mg Oral Daily   Gerhardt's butt cream   Topical Daily   insulin aspart  0-9 Units Subcutaneous TID WC   lactulose  30 g Oral TID   multivitamin  with minerals  1 tablet Oral Daily   pantoprazole  40 mg Oral Daily   rifaximin  550 mg Oral BID   thiamine  100 mg Oral Daily   Or   thiamine  100 mg Intravenous Daily   Continuous Infusions:  cefTRIAXone (ROCEPHIN)  IV 2 g (05/19/21 0739)   lactated ringers 50 mL/hr at 05/19/21 0658   PRN Meds:.lidocaine-EPINEPHrine, LORazepam   I have personally reviewed following labs and imaging studies  LABORATORY DATA:  Recent Labs  Lab 05/15/21 0711 05/16/21 0226 05/17/21 0540 05/18/21 0023 05/19/21 0147  WBC 6.3 7.8 8.4 10.1 10.4  HGB 7.5* 7.8* 8.2* 8.5* 7.7*  HCT 22.3* 23.7* 25.1* 25.8* 24.4*  PLT 131* 130* 125* 123* 117*  MCV 94.9 94.8 95.4 94.5 97.2  MCH 31.9 31.2 31.2 31.1 30.7  MCHC 33.6 32.9 32.7 32.9 31.6  RDW 13.6 13.2 13.4 13.2 13.4  LYMPHSABS  --   --   --  0.5* 0.5*  MONOABS  --   --   --  0.6  0.5  EOSABS  --   --   --  0.0 0.0  BASOSABS  --   --   --  0.0 0.0    Recent Labs  Lab 05/13/21 0318 05/14/21 0209 05/15/21 0711 05/16/21 0226 05/17/21 0540 05/18/21 0023 05/19/21 0147  NA 143 142 137 136 136 131* 130*  K 3.6 3.7 3.5 3.8 4.1 4.2 4.7  CL 115* 116* 101 103 107 102 103  CO2 18* 18* 26 23 22 22  20*  GLUCOSE 209* 265* 208* 218* 132* 312* 342*  BUN 164* 133* 69* 71* 61* 59* 55*  CREATININE 4.68* 3.66* 2.41* 2.49* 2.36* 2.40* 2.64*  CALCIUM 8.0* 7.7* 7.9* 8.1* 8.3* 8.2* 8.1*  AST 244* 160* 98* 84* 96* 83* 93*  ALT 175* 156* 132* 129* 130* 125* 127*  ALKPHOS 88 94 80 96 167* 201* 249*  BILITOT 2.4* 2.1* 1.6* 1.5* 1.2 1.1 1.1  ALBUMIN 2.3* 2.2* 2.2* 2.3* 2.3* 2.2* 2.1*  MG  --   --   --   --   --  2.6* 2.4  CRP  --   --   --   --   --  3.6* 2.8*  LATICACIDVEN 1.4  --   --   --   --   --   --   INR  --   --   --   --   --  1.4* 1.3*  AMMONIA 38* 35  --   --   --  28 50*  BNP  --   --   --   --   --  58.4 31.6       RADIOLOGY STUDIES/RESULTS: CT HEAD WO CONTRAST (5MM)  Result Date: 05/19/2021 CLINICAL DATA:  Head trauma, mod-severe Head injury, fall in room EXAM: CT HEAD WITHOUT CONTRAST TECHNIQUE: Contiguous axial images were obtained from the base of the skull through the vertex without intravenous contrast. COMPARISON:  05/12/2021 FINDINGS: Brain: Normal anatomic configuration. Parenchymal volume loss appears slightly advanced in relation to the patient's given age. Mild periventricular white matter changes are present likely reflecting the sequela of small vessel ischemia. No abnormal intra or extra-axial mass lesion or fluid collection. No abnormal mass effect or midline shift. No evidence of acute intracranial hemorrhage or infarct. Ventricular size is normal. Cerebellum unremarkable. Vascular: No asymmetric hyperdense vasculature at the skull base. Skull: Intact Sinuses/Orbits: Paranasal sinuses are clear. Orbits are unremarkable. Other: Mastoid air cells and  middle ear cavities are clear. IMPRESSION: No acute intracranial abnormality. Mild senescent change, advanced in relation of the patient's stated age. This, however, appears stable since prior examination. Electronically Signed   By: Fidela Salisbury M.D.   On: 05/19/2021 03:46   DG Chest Port 1 View  Result Date: 05/19/2021 CLINICAL DATA:  Shortness of breath and abdominal pain. EXAM: PORTABLE CHEST 1 VIEW COMPARISON:  None. FINDINGS: The heart size and mediastinal contours are within normal limits. Both lungs are clear. The visualized skeletal structures are unremarkable. IMPRESSION: No active disease. Electronically Signed   By: Kerby Moors M.D.   On: 05/19/2021 06:42   DG Abd Portable 1V  Result Date: 05/19/2021 CLINICAL DATA:  Abdominal pain. EXAM: PORTABLE ABDOMEN - 1 VIEW COMPARISON:  05/12/2021 FINDINGS: Bowel gas pattern appears nonspecific. There is a single dilated loop of small bowel within the right upper quadrant of the abdomen measuring 3.2 cm. Additional air-filled loops of small bowel are noted within the central abdomen. Gas and stool noted within the colon. IMPRESSION: Single dilated loop of small bowel noted within the right upper quadrant of the abdomen with gas noted in normal caliber colon. Findings may reflect focal small bowel ileus or low-grade partial small bowel obstruction. Electronically Signed   By: Kerby Moors M.D.   On: 05/19/2021 06:46     LOS: 7 days   Signature  Lala Lund M.D on 05/19/2021 at 11:19 AM   -  To page go to [email or web address]

## 2021-05-19 NOTE — Progress Notes (Signed)
Inpatient Diabetes Program Recommendations  AACE/ADA: New Consensus Statement on Inpatient Glycemic Control (2015)  Target Ranges:  Prepandial:   less than 140 mg/dL      Peak postprandial:   less than 180 mg/dL (1-2 hours)      Critically ill patients:  140 - 180 mg/dL   Lab Results  Component Value Date   HGBA1C 5.8 (H) 05/12/2021    Review of Glycemic Control  Latest Reference Range & Units 05/18/21 00:23 05/19/21 01:47  Glucose 70 - 99 mg/dL 312 (H) 342 (H)  (H): Data is abnormally high Diabetes history: Type 2 DM Outpatient Diabetes medications: none Current orders for Inpatient glycemic control: none Prednisone 40 mg QD  Inpatient Diabetes Program Recommendations:    Consider adding Novolog 0-6 units TID & HS.   Thanks, Bronson Curb, MSN, RNC-OB Diabetes Coordinator [phone] (8a-5p)

## 2021-05-20 DIAGNOSIS — N171 Acute kidney failure with acute cortical necrosis: Secondary | ICD-10-CM

## 2021-05-20 LAB — COMPREHENSIVE METABOLIC PANEL
ALT: 86 U/L — ABNORMAL HIGH (ref 0–44)
AST: 44 U/L — ABNORMAL HIGH (ref 15–41)
Albumin: 2.3 g/dL — ABNORMAL LOW (ref 3.5–5.0)
Alkaline Phosphatase: 194 U/L — ABNORMAL HIGH (ref 38–126)
Anion gap: 9 (ref 5–15)
BUN: 51 mg/dL — ABNORMAL HIGH (ref 6–20)
CO2: 19 mmol/L — ABNORMAL LOW (ref 22–32)
Calcium: 8 mg/dL — ABNORMAL LOW (ref 8.9–10.3)
Chloride: 102 mmol/L (ref 98–111)
Creatinine, Ser: 2.44 mg/dL — ABNORMAL HIGH (ref 0.61–1.24)
GFR, Estimated: 31 mL/min — ABNORMAL LOW (ref >60)
Glucose, Bld: 353 mg/dL — ABNORMAL HIGH (ref 70–99)
Potassium: 4 mmol/L (ref 3.5–5.1)
Sodium: 130 mmol/L — ABNORMAL LOW (ref 135–145)
Total Bilirubin: 0.9 mg/dL (ref 0.3–1.2)
Total Protein: 5.1 g/dL — ABNORMAL LOW (ref 6.5–8.1)

## 2021-05-20 LAB — MAGNESIUM: Magnesium: 2.1 mg/dL (ref 1.7–2.4)

## 2021-05-20 LAB — CBC
HCT: 23.5 % — ABNORMAL LOW (ref 39.0–52.0)
HCT: 23.8 % — ABNORMAL LOW (ref 39.0–52.0)
Hemoglobin: 7.6 g/dL — ABNORMAL LOW (ref 13.0–17.0)
Hemoglobin: 7.8 g/dL — ABNORMAL LOW (ref 13.0–17.0)
MCH: 30.4 pg (ref 26.0–34.0)
MCH: 30.8 pg (ref 26.0–34.0)
MCHC: 32.3 g/dL (ref 30.0–36.0)
MCHC: 32.8 g/dL (ref 30.0–36.0)
MCV: 94 fL (ref 80.0–100.0)
MCV: 94.1 fL (ref 80.0–100.0)
Platelets: 102 10*3/uL — ABNORMAL LOW (ref 150–400)
Platelets: 101 10*3/uL — ABNORMAL LOW (ref 150–400)
RBC: 2.5 MIL/uL — ABNORMAL LOW (ref 4.22–5.81)
RBC: 2.53 MIL/uL — ABNORMAL LOW (ref 4.22–5.81)
RDW: 13.8 % (ref 11.5–15.5)
RDW: 14.2 % (ref 11.5–15.5)
WBC: 9.2 10*3/uL (ref 4.0–10.5)
WBC: 9.4 10*3/uL (ref 4.0–10.5)
nRBC: 0 % (ref 0.0–0.2)
nRBC: 0 % (ref 0.0–0.2)

## 2021-05-20 LAB — PREPARE RBC (CROSSMATCH)

## 2021-05-20 LAB — CBC WITH DIFFERENTIAL/PLATELET
Abs Immature Granulocytes: 0.05 10*3/uL (ref 0.00–0.07)
Basophils Absolute: 0 10*3/uL (ref 0.0–0.1)
Basophils Relative: 0 %
Eosinophils Absolute: 0 10*3/uL (ref 0.0–0.5)
Eosinophils Relative: 0 %
HCT: 19.4 % — ABNORMAL LOW (ref 39.0–52.0)
Hemoglobin: 6.5 g/dL — CL (ref 13.0–17.0)
Immature Granulocytes: 1 %
Lymphocytes Relative: 7 %
Lymphs Abs: 0.5 10*3/uL — ABNORMAL LOW (ref 0.7–4.0)
MCH: 32 pg (ref 26.0–34.0)
MCHC: 33.5 g/dL (ref 30.0–36.0)
MCV: 95.6 fL (ref 80.0–100.0)
Monocytes Absolute: 0.5 10*3/uL (ref 0.1–1.0)
Monocytes Relative: 6 %
Neutro Abs: 6 10*3/uL (ref 1.7–7.7)
Neutrophils Relative %: 86 %
Platelets: 89 10*3/uL — ABNORMAL LOW (ref 150–400)
RBC: 2.03 MIL/uL — ABNORMAL LOW (ref 4.22–5.81)
RDW: 13.3 % (ref 11.5–15.5)
WBC: 7.1 10*3/uL (ref 4.0–10.5)
nRBC: 0 % (ref 0.0–0.2)

## 2021-05-20 LAB — GLUCOSE, CAPILLARY
Glucose-Capillary: 215 mg/dL — ABNORMAL HIGH (ref 70–99)
Glucose-Capillary: 256 mg/dL — ABNORMAL HIGH (ref 70–99)
Glucose-Capillary: 238 mg/dL — ABNORMAL HIGH (ref 70–99)

## 2021-05-20 LAB — AMMONIA: Ammonia: 19 umol/L (ref 9–35)

## 2021-05-20 LAB — C-REACTIVE PROTEIN: CRP: 1.4 mg/dL — ABNORMAL HIGH (ref <1.0)

## 2021-05-20 LAB — PROTIME-INR
INR: 1.4 — ABNORMAL HIGH (ref 0.8–1.2)
Prothrombin Time: 17.2 seconds — ABNORMAL HIGH (ref 11.4–15.2)

## 2021-05-20 LAB — BRAIN NATRIURETIC PEPTIDE: B Natriuretic Peptide: 57.9 pg/mL (ref 0.0–100.0)

## 2021-05-20 LAB — PATHOLOGIST SMEAR REVIEW: Path Review: REACTIVE

## 2021-05-20 MED ORDER — SODIUM CHLORIDE 0.9% IV SOLUTION: Freq: Once | INTRAVENOUS | Status: AC

## 2021-05-20 MED ORDER — ACETAMINOPHEN 325 MG PO TABS
650.0000 mg | ORAL_TABLET | Freq: Once | ORAL | Status: AC
  Administered 2021-05-20: 650 mg via ORAL
  Filled 2021-05-20: qty 2

## 2021-05-20 MED ORDER — DIPHENHYDRAMINE HCL 25 MG PO CAPS
25.0000 mg | ORAL_CAPSULE | Freq: Once | ORAL | Status: AC
  Administered 2021-05-20: 25 mg via ORAL
  Filled 2021-05-20: qty 1

## 2021-05-20 MED ORDER — FUROSEMIDE 10 MG/ML IJ SOLN
20.0000 mg | Freq: Once | INTRAMUSCULAR | Status: AC
  Administered 2021-05-20: 20 mg via INTRAVENOUS
  Filled 2021-05-20: qty 2

## 2021-05-20 MED ORDER — SODIUM BICARBONATE 650 MG PO TABS
650.0000 mg | ORAL_TABLET | Freq: Two times a day (BID) | ORAL | Status: DC
  Administered 2021-05-20 – 2021-05-21 (×3): 650 mg via ORAL
  Filled 2021-05-20 (×3): qty 1

## 2021-05-20 NOTE — Progress Notes (Signed)
Physical Therapy Treatment Patient Details Name: Martin Anthony MRN: [id number] DOB: [birth year] Today's Date: 05/20/2021   History of Present Illness 55 y.o. male was found on the floor and patient was brought to the ER 05/12/21. Pt admitted for acute renal failure; encephalopathy; rhabdomyolysis; CIWA protocol. PMH alcoholic liver cirrhosis with portal hypertension and esophageal varices; ascending colon mass, paroxysmal atrial fibrillation, hyperglycemia, alcoholism.    PT Comments    Cognition much improved. Remains unsteady on his feet requiring up to min assist for balance with use of RW (due to painful feet rt>left). States he is now going to stay with his mother with one step to enter home. Will continue to benefit from PT to increase safety and independence.     Recommendations for follow up therapy are one component of a multi-disciplinary discharge planning process, led by the attending physician.  Recommendations may be updated based on patient status, additional functional criteria and insurance authorization.  Follow Up Recommendations  Home health PT     Assistance Recommended at Discharge Intermittent Supervision/Assistance  Equipment Recommendations  Rolling walker (2 wheels)    Recommendations for Other Services       Precautions / Restrictions Precautions Precautions: Fall Restrictions Weight Bearing Restrictions: No     Mobility  Bed Mobility Overal bed mobility: Needs Assistance Bed Mobility: Supine to Sit     Supine to sit: Supervision;HOB elevated Sit to supine: Independent   General bed mobility comments: supervision for safety due to hgb 6.5    Transfers Overall transfer level: Needs assistance Equipment used: Rolling walker (2 wheels) Transfers: Sit to/from Stand Sit to Stand: Min assist           General transfer comment: minA for stability, vc for proper sequencing with RW; repeated x 2 reps    Ambulation/Gait Ambulation/Gait  assistance: Min assist Gait Distance (Feet): 100 Feet Assistive device: Rolling walker (2 wheels) Gait Pattern/deviations: Step-through pattern;Decreased stride length;Antalgic   Gait velocity interpretation: <1.8 ft/sec, indicate of risk for recurrent falls   General Gait Details: after 50 ft pt with incr pain Rt foot with imbalance requiring min assist to recover; remainder of walk with antalgic less steady gait   Stairs             Wheelchair Mobility    Modified Rankin (Stroke Patients Only)       Balance Overall balance assessment: Needs assistance Sitting-balance support: Feet supported;No upper extremity supported Sitting balance-Leahy Scale: Fair     Standing balance support: Bilateral upper extremity supported;Reliant on assistive device for balance Standing balance-Leahy Scale: Poor                              Cognition Arousal/Alertness: Awake/alert Behavior During Therapy: WFL for tasks assessed/performed Overall Cognitive Status: Within Functional Limits for tasks assessed                                          Exercises      General Comments General comments (skin integrity, edema, etc.): supine BP 111/67 HR 84; seated 124/76 HR 79 no dizziness; no dizziness with standing      Pertinent Vitals/Pain Pain Assessment: Faces Faces Pain Scale: Hurts little more Pain Location: both legs Pain Descriptors / Indicators: Discomfort;Numbness Pain Intervention(s): Limited activity within patient's tolerance;Monitored during session    Home Living  Prior Function            PT Goals (current goals can now be found in the care plan section) Acute Rehab PT Goals Patient Stated Goal: pt unable to set appropriate goal, PT goal to return to independent mobility Time For Goal Achievement: 05/31/21 Potential to Achieve Goals: Good Progress towards PT goals: Progressing toward goals     Frequency    Min 3X/week      PT Plan Discharge plan needs to be updated    Co-evaluation              AM-PAC PT "6 Clicks" Mobility   Outcome Measure  Help needed turning from your back to your side while in a flat bed without using bedrails?: None Help needed moving from lying on your back to sitting on the side of a flat bed without using bedrails?: A Little Help needed moving to and from a bed to a chair (including a wheelchair)?: A Little Help needed standing up from a chair using your arms (e.g., wheelchair or bedside chair)?: A Little Help needed to walk in hospital room?: A Little Help needed climbing 3-5 steps with a railing? : A Lot 6 Click Score: 18    End of Session Equipment Utilized During Treatment: Gait belt Activity Tolerance: Patient tolerated treatment well Patient left: in bed;with call bell/phone within reach;with bed alarm set Nurse Communication: Mobility status PT Visit Diagnosis: Other abnormalities of gait and mobility (R26.89);Muscle weakness (generalized) (M62.81);Other symptoms and signs involving the nervous system (X48.016)     Time: 5537-4827 PT Time Calculation (min) (ACUTE ONLY): 26 min  Charges:  $Gait Training: 23-37 mins                      Arby Barrette, PT [place]  Pager [phone] Office [phone]    Rexanne Mano 05/20/2021, 9:43 AM

## 2021-05-20 NOTE — Plan of Care (Signed)
  Problem: Education: Goal: Knowledge of General Education information will improve Description: Including pain rating scale, medication(s)/side effects and non-pharmacologic comfort measures Outcome: Progressing   Problem: Health Behavior/Discharge Planning: Goal: Ability to manage health-related needs will improve Outcome: Progressing   Problem: Clinical Measurements: Goal: Ability to maintain clinical measurements within normal limits will improve Outcome: Progressing Goal: Will remain free from infection Outcome: Progressing Goal: Diagnostic test results will improve Outcome: Progressing Goal: Respiratory complications will improve Outcome: Progressing Goal: Cardiovascular complication will be avoided Outcome: Progressing   Problem: Activity: Goal: Risk for activity intolerance will decrease Outcome: Progressing   Problem: Nutrition: Goal: Adequate nutrition will be maintained Outcome: Progressing   Problem: Coping: Goal: Level of anxiety will decrease Outcome: Progressing   Problem: Elimination: Goal: Will not experience complications related to bowel motility Outcome: Progressing Goal: Will not experience complications related to urinary retention Outcome: Progressing   Problem: Safety: Goal: Ability to remain free from injury will improve Outcome: Progressing   Problem: Skin Integrity: Goal: Risk for impaired skin integrity will decrease Outcome: Progressing   Problem: Activity: Goal: Activity intolerance will improve Outcome: Progressing

## 2021-05-20 NOTE — Progress Notes (Incomplete)
Inpatient Diabetes Program Recommendations  AACE/ADA: New Consensus Statement on Inpatient Glycemic Control (2015)  Target Ranges:  Prepandial:   less than 140 mg/dL      Peak postprandial:   less than 180 mg/dL (1-2 hours)      Critically ill patients:  140 - 180 mg/dL   Lab Results  Component Value Date   GLUCAP 215 (H) 05/20/2021   HGBA1C 5.8 (H) 05/12/2021    Review of Glycemic Control  Latest Reference Range & Units 05/19/21 16:47 05/19/21 20:40 05/20/21 07:37  Glucose-Capillary 70 - 99 mg/dL 341 (H) 331 (H) 215 (H)  (H): Data is abnormally high Diabetes history: Type 2 DM Outpatient Diabetes medications: none Current orders for Inpatient glycemic control: Novolog 0-9 units TID Prednisone 40 mg QD   Inpatient Diabetes Program Recommendations:

## 2021-05-20 NOTE — Progress Notes (Signed)
[facility] Progress Note  Subjective: no acute events. He reports that he is urinating 'a lot'. Hgb down to 6.5 today, to receive 2u prbc.  Vitals:   05/20/21 0415 05/20/21 0430 05/20/21 0656 05/20/21 0739  BP:  119/64 (!) 106/59 120/65  Pulse:  71 73 69  Resp:  16 16 16   Temp:  98.1 F (36.7 C) 98.1 F (36.7 C) 98.1 F (36.7 C)  TempSrc:  Oral Oral Oral  SpO2: 99%   97%  Weight:      Height:        Exam: Gen: NAD, laying flat in bed Chest clear bilat to bases, no rales/ wheezing RRR no MRG Abd soft ntnd no mass or ascites +bs GU normal male w/ foley in place Ext: trace pedal edema Neuro - awake and nonfocal exam        Home meds include - lasix 40 qd, nadolol 20 qd, zoloft 50, aldactone 100 qd         CT stone study noncon - cirrhosis, ^spleen, GG changes RML/ RLL, normal appearing kidneys bilat no hydro     CXR 11/28 - clear     UNa <10,  UCr 163       UA - >50 rbc, 0-5 wbc, rare bact, neg protein, large Hb      Recent Labs  Lab 05/19/21 0147 05/20/21 0018  K 4.7 4.0  BUN 55* 51*  CREATININE 2.64* 2.44*  CALCIUM 8.1* 8.0*  HGB 7.7* 6.5*   Inpatient medications:  chlordiazePOXIDE  5 mg Oral BID   collagenase   Topical Daily   folic acid  1 mg Oral Daily   Gerhardt's butt cream   Topical Daily   insulin aspart  0-9 Units Subcutaneous TID WC   lactulose  30 g Oral TID   multivitamin with minerals  1 tablet Oral Daily   pantoprazole  40 mg Oral Daily   rifaximin  550 mg Oral BID   thiamine  100 mg Oral Daily   Or   thiamine  100 mg Intravenous Daily    cefTRIAXone (ROCEPHIN)  IV 2 g (05/20/21 0736)   lidocaine-EPINEPHrine, LORazepam       Assessment/ Plan: AKI - in pt w/ hx ETOH abuse and possibly decompensated cirrhosis. No ascites on exam. CT w/o renal obstruction. UA pending. Urine lytes were c/w prerenal. UA showed rbc's only, prob d/t foley trauma. Looked dry on initial exam w/ soft BP's. Suspected ATN from rhabdo and/or vol  depletion + hypotension. Hepatorenal syndrome other possibility (low UNa, cirrhosis w/ portal hypertension). Got 4L bolus 1st day then further IVF's. Got 1 HD on 11/30 for persistent AMS which improved significantly the next day and has not required further HD.  Peak creat 4.8. Cr relatively stable today, 2.4 Will need a local nephrologist to follow up with him ([place] Anemia: receiving 2u prbc, per primary service Hypernatremia - resolved. Now hyponatremic--corrected Na for hyperglycemia around 134 today Vol depletion - resolved, relatively euvolemic on exam ETOH abuse/ alcoholic hepatitis - ^LFT"s improving Cirrhosis/ splenomegaly Confusion/ AMS - slowly improving, fully oriented today. Per pmd.  Hyperglycemia-likely driven by steroids. Recommend checking BG. Per primary Metabolic acidosis: will start nahco3 650mg  bid   Gean Quint, MD [facility]

## 2021-05-20 NOTE — Progress Notes (Addendum)
PROGRESS NOTE        PATIENT DETAILS Name: Martin Anthony Age: 55 y.o. Sex: male Date of Birth: 30-Dec-1965 Admit Date: 05/11/2021 Admitting Physician Rise Patience, MD PCP:Fry, Ishmael Holter, MD  Brief Narrative: Patient is a 55 y.o. male with history of liver cirrhosis, EtOH use, PAF-who presented with confusion/found down at home (found by ex wife)-patient was found to have acute metabolic encephalopathy in the setting of AKI/hepatic encephalopathy.  Initially monitored at [facility] transferred to [facility] for potential dialysis.  See below for details.  Subjective:  Patient in bed, appears comfortable, denies any headache, no fever, no chest pain or pressure, no shortness of breath , no abdominal pain. No new focal weakness.   Objective: Vitals: Blood pressure 109/71, pulse 65, temperature 98.2 F (36.8 C), temperature source Oral, resp. rate 16, height 6\' 3"  (1.905 m), weight 75.7 kg, SpO2 98 %.   Exam:  Awake Alert, No new F.N deficits, Normal affect Hailey.AT,PERRAL Supple Neck, No JVD,   Symmetrical Chest wall movement, Good air movement bilaterally, CTAB RRR,No Gallops, Rubs or new Murmurs,  +ve B.Sounds, Abd Soft, No tenderness,   No Cyanosis, Clubbing or edema    Assessment/Plan:  Acute metabolic encephalopathy: Multifactorial-due to uremic encephalopathy, hepatic encephalopathy, possible alcohol withdrawal symptoms.  CT head without any acute abnormalities.  Mental status is now gradually improving-is now close to baseline.  Taper off sedative medications  AKI: Suspicion for hemodynamically mediated kidney injury-no hydronephrosis seen on CT imaging.  No proteinuria on UA.  Renal function seems to be improving-electrolytes stable-significant urine output overnight.  Did receive 1 session of hemodialysis on 11/30- Per Renal DC HD Cath and Foley on 05/18/21.  Note patient ended up pulling the Foley catheter out himself by mistake.  Alcoholic hepatitis:  Transaminitis improving-acute hepatitis serology was negative-discriminant factor close to 5.7.  Stop steroids.  Will check if he he has enough fluid for ultrasound-guided paracentesis, if radiology is able to obtain some fluid we will order appropriate lab studies to rule out SBP.  For now continue empiric antibiotic.  Acute hematuria induced blood loss related anemia on top of chronic anemia.  1 unit of packed RBC transfusion on 05/20/2021, monitor hematuria.  Patient had pulled his Foley catheter by mistake on 05/18/2021.  Will continue to monitor CBC  Rhabdomyolysis: Due to being down on the floor prior to this hospitalization-CK levels have almost normalized.  Liver cirrhosis/portal hypertension/esophageal varices: Overall stable-continue to hold diuretics-resume beta-blocker once blood pressure stays normal for a few days.    Hepatic encephalopathy: Since mental status is better-and patient not able to take oral intake-we will switch from lactulose enema to oral lactulose.   Alcohol withdrawal EtOH use/alcohol withdrawal: Improved now and mentation  is close to baseline now.   History of PA Is close to baseline.  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   Depression: Resume Zoloft over the next few days.  BMI Estimated body mass index is 20.86 kg/m as calculated from the following:   Height as of this encounter: 6\' 3"  (1.905 m).   Weight as of this encounter: 75.7 kg.   Wound care Unstageable Pressure injuries to Bilateral Ankles  and Coccyx, present on admission pressure injuries: See below  Pressure Injury 05/13/21 Ankle Right;Lateral Unstageable - Full thickness tissue loss in which the base of the injury is covered by slough (yellow, tan, gray, green  or brown) and/or eschar (tan, brown or black) in the wound bed. (Active)  05/13/21 0400  Location: Ankle  Location Orientation: Right;Lateral  Staging: Unstageable - Full thickness tissue loss in which the base of the injury is covered by slough (yellow, tan, gray, green or brown) and/or eschar (tan, brown or black) in the wound bed.  Wound Description (Comments):   Present on Admission: Yes     Pressure Injury 05/13/21 Ankle Left;Lateral Unstageable - Full thickness tissue loss in which the base of the injury is covered by slough (yellow, tan, gray, green or brown) and/or eschar (tan, brown or black) in the wound bed. (Active)  05/13/21 0400  Location: Ankle  Location Orientation: Left;Lateral  Staging: Unstageable - Full thickness tissue loss in which the base of the injury is covered by slough (yellow, tan, gray, green or brown) and/or eschar (tan, brown or black) in the wound bed.  Wound Description (Comments):   Present on Admission: Yes     Pressure Injury 05/13/21 Coccyx Unstageable - Full thickness tissue loss in which the base of the injury is covered by slough (yellow, tan, gray, green or brown) and/or eschar (tan, brown or black) in the wound bed. (Active)  05/13/21 0400  Location: Coccyx  Location Orientation:   Staging: Unstageable - Full thickness tissue loss in which the base of the injury is covered by slough (yellow, tan, gray, green or brown) and/or eschar (tan, brown or black) in the wound bed.  Wound Description (Comments):   Present on Admission: Yes     Pressure Injury 05/13/21 Buttocks Left;Lower (Active)  05/13/21 0400  Location: Buttocks  Location Orientation: Left;Lower  Staging:   Wound Description (Comments):   Present on Admission: Yes     Pressure Injury 05/13/21 Buttocks Right (Active)  05/13/21 0400  Location: Buttocks  Location Orientation: Right  Staging:   Wound Description (Comments):   Present on Admission: Yes      Procedures: 11/29>>  temporary HD catheter placed by IR. Consults: Renal DVT Prophylaxis: SCD Code Status:Full code  Family Communication: updated mother on [phone] 05/17/21, 05/20/2021  Time spent: 25 minutes-Greater than 50% of this time was spent in counseling, explanation of diagnosis, planning of further management, and coordination of care.   Disposition Plan: Status is: Inpatient  Remains inpatient appropriate because: AKI/encephalopathy   Diet: Diet Order             Diet regular Room service appropriate? No; Fluid consistency: Thin  Diet effective now                     Antimicrobial agents: Anti-infectives (From admission, onward)    Start     Dose/Rate Route Frequency Ordered Stop   05/19/21 1000  rifaximin (XIFAXAN) tablet 550 mg        550 mg Oral 2 times daily 05/19/21 0600     05/12/21 0715  cefTRIAXone (ROCEPHIN) 2 g in sodium chloride 0.9 % 100 mL IVPB        2 g 200 mL/hr over 30 Minutes Intravenous Every 24 hours 05/12/21 0701          MEDICATIONS: Scheduled Meds:  chlordiazePOXIDE  5 mg Oral BID   collagenase   Topical Daily   folic acid  1  mg Oral Daily   Gerhardt's butt cream   Topical Daily   insulin aspart  0-9 Units Subcutaneous TID WC   lactulose  30 g Oral TID   multivitamin with minerals  1 tablet Oral Daily   pantoprazole  40 mg Oral Daily   rifaximin  550 mg Oral BID   sodium bicarbonate  650 mg Oral BID   thiamine  100 mg Oral Daily   Or   thiamine  100 mg Intravenous Daily   Continuous Infusions:  cefTRIAXone (ROCEPHIN)  IV 2 g (05/20/21 0736)   PRN Meds:.lidocaine-EPINEPHrine, LORazepam   I have personally reviewed following labs and imaging studies  LABORATORY DATA:  Recent Labs  Lab 05/17/21 0540 05/18/21 0023 05/19/21 0147 05/20/21 0018 05/20/21 0944  WBC 8.4 10.1 10.4 7.1 9.2  HGB 8.2* 8.5* 7.7* 6.5* 7.6*  HCT 25.1* 25.8* 24.4* 19.4* 23.5*  PLT 125* 123* 117* 89* 102*  MCV 95.4 94.5 97.2 95.6 94.0  MCH 31.2 31.1  30.7 32.0 30.4  MCHC 32.7 32.9 31.6 33.5 32.3  RDW 13.4 13.2 13.4 13.3 13.8  LYMPHSABS  --  0.5* 0.5* 0.5*  --   MONOABS  --  0.6 0.5 0.5  --   EOSABS  --  0.0 0.0 0.0  --   BASOSABS  --  0.0 0.0 0.0  --     Recent Labs  Lab 05/14/21 0209 05/15/21 0711 05/16/21 0226 05/17/21 0540 05/18/21 0023 05/19/21 0147 05/20/21 0018  NA 142   < > 136 136 131* 130* 130*  K 3.7   < > 3.8 4.1 4.2 4.7 4.0  CL 116*   < > 103 107 102 103 102  CO2 18*   < > 23 22 22  20* 19*  GLUCOSE 265*   < > 218* 132* 312* 342* 353*  BUN 133*   < > 71* 61* 59* 55* 51*  CREATININE 3.66*   < > 2.49* 2.36* 2.40* 2.64* 2.44*  CALCIUM 7.7*   < > 8.1* 8.3* 8.2* 8.1* 8.0*  AST 160*   < > 84* 96* 83* 93* 44*  ALT 156*   < > 129* 130* 125* 127* 86*  ALKPHOS 94   < > 96 167* 201* 249* 194*  BILITOT 2.1*   < > 1.5* 1.2 1.1 1.1 0.9  ALBUMIN 2.2*   < > 2.3* 2.3* 2.2* 2.1* 2.3*  MG  --   --   --   --  2.6* 2.4 2.1  CRP  --   --   --   --  3.6* 2.8* 1.4*  INR  --   --   --   --  1.4* 1.3* 1.4*  AMMONIA 35  --   --   --  28 50* 19  BNP  --   --   --   --  58.4 31.6 57.9   < > = values in this interval not displayed.       RADIOLOGY STUDIES/RESULTS: CT HEAD WO CONTRAST (5MM)  Result Date: 05/19/2021 CLINICAL DATA:  Head trauma, mod-severe Head injury, fall in room EXAM: CT HEAD WITHOUT CONTRAST TECHNIQUE: Contiguous axial images were obtained from the base of the skull through the vertex without intravenous contrast. COMPARISON:  05/12/2021 FINDINGS: Brain: Normal anatomic configuration. Parenchymal volume loss appears slightly advanced in relation to the patient's given age. Mild periventricular white matter changes are present likely reflecting the sequela of small vessel ischemia. No abnormal intra or extra-axial mass lesion or fluid collection.  No abnormal mass effect or midline shift. No evidence of acute intracranial hemorrhage or infarct. Ventricular size is normal. Cerebellum unremarkable. Vascular: No asymmetric  hyperdense vasculature at the skull base. Skull: Intact Sinuses/Orbits: Paranasal sinuses are clear. Orbits are unremarkable. Other: Mastoid air cells and middle ear cavities are clear. IMPRESSION: No acute intracranial abnormality. Mild senescent change, advanced in relation of the patient's stated age. This, however, appears stable since prior examination. Electronically Signed   By: Fidela Salisbury M.D.   On: 05/19/2021 03:46   DG Chest Port 1 View  Result Date: 05/19/2021 CLINICAL DATA:  Shortness of breath and abdominal pain. EXAM: PORTABLE CHEST 1 VIEW COMPARISON:  None. FINDINGS: The heart size and mediastinal contours are within normal limits. Both lungs are clear. The visualized skeletal structures are unremarkable. IMPRESSION: No active disease. Electronically Signed   By: Kerby Moors M.D.   On: 05/19/2021 06:42   DG Abd Portable 1V  Result Date: 05/19/2021 CLINICAL DATA:  Abdominal pain. EXAM: PORTABLE ABDOMEN - 1 VIEW COMPARISON:  05/12/2021 FINDINGS: Bowel gas pattern appears nonspecific. There is a single dilated loop of small bowel within the right upper quadrant of the abdomen measuring 3.2 cm. Additional air-filled loops of small bowel are noted within the central abdomen. Gas and stool noted within the colon. IMPRESSION: Single dilated loop of small bowel noted within the right upper quadrant of the abdomen with gas noted in normal caliber colon. Findings may reflect focal small bowel ileus or low-grade partial small bowel obstruction. Electronically Signed   By: Kerby Moors M.D.   On: 05/19/2021 06:46   IR Paracentesis  Result Date: 05/19/2021 INDICATION: Abdominal distension. Request for therapeutic and diagnostic paracentesis. EXAM: ULTRASOUND GUIDED  PARACENTESIS MEDICATIONS: 10 mL 1% lidocaine COMPLICATIONS: None immediate. PROCEDURE: Informed written consent was obtained from the patient after a discussion of the risks, benefits and alternatives to treatment. A timeout was  performed prior to the initiation of the procedure. Initial ultrasound scanning demonstrates a large amount of ascites within the right lower abdominal quadrant. The right lower abdomen was prepped and draped in the usual sterile fashion. 1% lidocaine was used for local anesthesia. Following this, a 19 gauge, 7-cm, Yueh catheter was introduced. An ultrasound image was saved for documentation purposes. The paracentesis was performed. The catheter was removed and a dressing was applied. The patient tolerated the procedure well without immediate post procedural complication. FINDINGS: A total of approximately 1. 3 L of hazy pale yellow fluid was removed. Samples were sent to the laboratory as requested by the clinical team. IMPRESSION: Successful ultrasound-guided paracentesis yielding 1.3 liters of peritoneal fluid. Read by: Durenda Guthrie, PA-C Electronically Signed   By: Ruthann Cancer M.D.   On: 05/19/2021 17:46     LOS: 8 days   Signature  Lala Lund M.D on 05/20/2021 at 11:55 AM   -  To page go to [email or web address]

## 2021-05-20 NOTE — Progress Notes (Signed)
  Inpatient Rehabilitation Admissions Coordinator   Noted therapy recommendations are for [facility]. We will sign off at this time.  Danne Baxter, RN, MSN Rehab Admissions Coordinator [phone] 05/20/2021 11:44 AM

## 2021-05-20 NOTE — Progress Notes (Signed)
Notified by RN that Hgb dropped to 6.5 on labs this am. Ordered PRBC transfusion of 2 units. Tylenol and benadryl before units, lasix ordered between units.

## 2021-05-21 ENCOUNTER — Other Ambulatory Visit (HOSPITAL_COMMUNITY): Payer: Self-pay

## 2021-05-21 ENCOUNTER — Telehealth: Payer: Self-pay

## 2021-05-21 LAB — CBC WITH DIFFERENTIAL/PLATELET
Abs Immature Granulocytes: 0.05 10*3/uL (ref 0.00–0.07)
Basophils Absolute: 0 10*3/uL (ref 0.0–0.1)
Basophils Relative: 0 %
Eosinophils Absolute: 0.1 10*3/uL (ref 0.0–0.5)
Eosinophils Relative: 1 %
HCT: 23.2 % — ABNORMAL LOW (ref 39.0–52.0)
Hemoglobin: 7.5 g/dL — ABNORMAL LOW (ref 13.0–17.0)
Immature Granulocytes: 1 %
Lymphocytes Relative: 11 %
Lymphs Abs: 0.8 10*3/uL (ref 0.7–4.0)
MCH: 30.1 pg (ref 26.0–34.0)
MCHC: 32.3 g/dL (ref 30.0–36.0)
MCV: 93.2 fL (ref 80.0–100.0)
Monocytes Absolute: 0.4 10*3/uL (ref 0.1–1.0)
Monocytes Relative: 5 %
Neutro Abs: 6.5 10*3/uL (ref 1.7–7.7)
Neutrophils Relative %: 82 %
Platelets: 99 10*3/uL — ABNORMAL LOW (ref 150–400)
RBC: 2.49 MIL/uL — ABNORMAL LOW (ref 4.22–5.81)
RDW: 13.9 % (ref 11.5–15.5)
WBC: 7.9 10*3/uL (ref 4.0–10.5)
nRBC: 0 % (ref 0.0–0.2)

## 2021-05-21 LAB — COMPREHENSIVE METABOLIC PANEL
ALT: 85 U/L — ABNORMAL HIGH (ref 0–44)
AST: 58 U/L — ABNORMAL HIGH (ref 15–41)
Albumin: 2.3 g/dL — ABNORMAL LOW (ref 3.5–5.0)
Alkaline Phosphatase: 210 U/L — ABNORMAL HIGH (ref 38–126)
Anion gap: 10 (ref 5–15)
BUN: 47 mg/dL — ABNORMAL HIGH (ref 6–20)
CO2: 21 mmol/L — ABNORMAL LOW (ref 22–32)
Calcium: 8 mg/dL — ABNORMAL LOW (ref 8.9–10.3)
Chloride: 100 mmol/L (ref 98–111)
Creatinine, Ser: 2.7 mg/dL — ABNORMAL HIGH (ref 0.61–1.24)
GFR, Estimated: 27 mL/min — ABNORMAL LOW (ref >60)
Glucose, Bld: 297 mg/dL — ABNORMAL HIGH (ref 70–99)
Potassium: 3.7 mmol/L (ref 3.5–5.1)
Sodium: 131 mmol/L — ABNORMAL LOW (ref 135–145)
Total Bilirubin: 1.1 mg/dL (ref 0.3–1.2)
Total Protein: 5.1 g/dL — ABNORMAL LOW (ref 6.5–8.1)

## 2021-05-21 LAB — PROTIME-INR
INR: 1.3 — ABNORMAL HIGH (ref 0.8–1.2)
Prothrombin Time: 16.2 seconds — ABNORMAL HIGH (ref 11.4–15.2)

## 2021-05-21 LAB — GLUCOSE, CAPILLARY
Glucose-Capillary: 150 mg/dL — ABNORMAL HIGH (ref 70–99)
Glucose-Capillary: 275 mg/dL — ABNORMAL HIGH (ref 70–99)

## 2021-05-21 LAB — C-REACTIVE PROTEIN: CRP: 1.1 mg/dL — ABNORMAL HIGH (ref <1.0)

## 2021-05-21 LAB — MAGNESIUM: Magnesium: 2.1 mg/dL (ref 1.7–2.4)

## 2021-05-21 LAB — BRAIN NATRIURETIC PEPTIDE: B Natriuretic Peptide: 53.2 pg/mL (ref 0.0–100.0)

## 2021-05-21 LAB — AMMONIA: Ammonia: 39 umol/L — ABNORMAL HIGH (ref 9–35)

## 2021-05-21 MED ORDER — LACTULOSE ENCEPHALOPATHY 10 GM/15ML PO SOLN
20.0000 g | Freq: Two times a day (BID) | ORAL | 0 refills | Status: DC
Start: 2021-05-21 — End: 2021-12-05
  Filled 2021-05-21: qty 236, 4d supply, fill #0

## 2021-05-21 MED ORDER — PANTOPRAZOLE SODIUM 40 MG PO TBEC
40.0000 mg | DELAYED_RELEASE_TABLET | Freq: Every day | ORAL | 0 refills | Status: DC
  Filled 2021-05-21: qty 30, 30d supply, fill #0

## 2021-05-21 MED ORDER — FOLIC ACID 1 MG PO TABS
1.0000 mg | ORAL_TABLET | Freq: Every day | ORAL | 0 refills | Status: AC
Start: 2021-05-21 — End: ?
  Filled 2021-05-21: qty 30, 30d supply, fill #0

## 2021-05-21 MED ORDER — SODIUM BICARBONATE 650 MG PO TABS
650.0000 mg | ORAL_TABLET | Freq: Two times a day (BID) | ORAL | 0 refills | Status: DC
  Filled 2021-05-21: qty 60, 30d supply, fill #0

## 2021-05-21 MED ORDER — NADOLOL 20 MG PO TABS
10.0000 mg | ORAL_TABLET | Freq: Every day | ORAL | 0 refills | Status: DC
  Filled 2021-05-21: qty 15, 30d supply, fill #0

## 2021-05-21 MED ORDER — THIAMINE HCL 100 MG PO TABS
100.0000 mg | ORAL_TABLET | Freq: Every day | ORAL | 0 refills | Status: AC
  Filled 2021-05-21: qty 30, 30d supply, fill #0

## 2021-05-21 NOTE — Progress Notes (Signed)
[facility] Progress Note  Subjective: no acute events. He reports that he is still urinating a lot. Open for discharge today.  Vitals:   05/20/21 1549 05/20/21 2000 05/21/21 0400 05/21/21 0732  BP: 110/75 106/73 (!) 89/50 104/65  Pulse: 75 88 69 90  Resp: 17 17 19 18   Temp: 97.8 F (36.6 C) 98.5 F (36.9 C) 97.9 F (36.6 C) 98.3 F (36.8 C)  TempSrc: Oral Oral Oral Oral  SpO2: 99% 98% 100% 96%  Weight:      Height:        Exam: Gen: NAD, sitting up in bed Chest clear bilat to bases, no rales/ wheezing RRR no MRG Abd soft ntnd no mass or ascites +bs GU normal male w/ foley in place Ext: trace pedal edema Neuro - awake and nonfocal exam        Home meds include - lasix 40 qd, nadolol 20 qd, zoloft 50, aldactone 100 qd         CT stone study noncon - cirrhosis, ^spleen, GG changes RML/ RLL, normal appearing kidneys bilat no hydro     CXR 11/28 - clear     UNa <10,  UCr 163       UA - >50 rbc, 0-5 wbc, rare bact, neg protein, large Hb      Recent Labs  Lab 05/20/21 0018 05/20/21 0944 05/20/21 1408 05/21/21 0045  K 4.0  --   --  3.7  BUN 51*  --   --  47*  CREATININE 2.44*  --   --  2.70*  CALCIUM 8.0*  --   --  8.0*  HGB 6.5*   < > 7.8* 7.5*   < > = values in this interval not displayed.   Inpatient medications:  collagenase   Topical Daily   folic acid  1 mg Oral Daily   Gerhardt's butt cream   Topical Daily   insulin aspart  0-9 Units Subcutaneous TID WC   lactulose  30 g Oral TID   multivitamin with minerals  1 tablet Oral Daily   pantoprazole  40 mg Oral Daily   rifaximin  550 mg Oral BID   sodium bicarbonate  650 mg Oral BID   thiamine  100 mg Oral Daily   Or   thiamine  100 mg Intravenous Daily     lidocaine-EPINEPHrine, LORazepam       Assessment/ Plan: AKI - in pt w/ hx ETOH abuse and possibly decompensated cirrhosis. No ascites on exam. CT w/o renal obstruction. UA pending. Urine lytes were c/w prerenal. UA showed  rbc's only, prob d/t foley trauma. Looked dry on initial exam w/ soft BP's. Suspected ATN from rhabdo and/or vol depletion + hypotension. Hepatorenal syndrome other possibility (low UNa, cirrhosis w/ portal hypertension). Got 4L bolus 1st day then further IVF's. Got 1 HD on 11/30 for persistent AMS which improved significantly the next day and has not required further HD.  Peak creat 4.8. Cr slightly up today to 2.7 but has been hovering around that range, he did receive lasix yesterday with PRBC transfusion. Would be okay with discharge today, did advise him that he needs to have his labs checked with his PCP on Friday if possible. Will need a local nephrologist to follow up with him ([place] Anemia: s/p 1u prbc yesterday Hypernatremia - resolved. Now hyponatremic--corrected Na for hyperglycemia around 134 today Vol depletion - resolved, relatively euvolemic  on exam ETOH abuse/ alcoholic hepatitis - ^LFT"s improving Cirrhosis/ splenomegaly-hold lasix and aldactone for now. Lasix prn for volume, did receive a dose today Confusion/ AMS - slowly improving, fully oriented today. Per pmd.  Hyperglycemia-likely driven by steroids. Per primary Metabolic acidosis: nahco3 650mg  bid   Gean Quint, MD [facility]

## 2021-05-21 NOTE — Progress Notes (Signed)
Physical Therapy Treatment Patient Details Name: Martin Anthony MRN: [id number] DOB: 1965/07/22 Today's Date: 05/21/2021   History of Present Illness 55 y.o. male was found on the floor and patient was brought to the ER 05/12/21. Pt admitted for acute renal failure; encephalopathy; rhabdomyolysis; CIWA protocol. PMH alcoholic liver cirrhosis with portal hypertension and esophageal varices; ascending colon mass, paroxysmal atrial fibrillation, hyperglycemia, alcoholism.    PT Comments    Pt limited by reports of foot pain when ambulating. Pt is able to mobilize with assistance for safety at this time, no losses of balance observed when mobilizing with walker. Pt does appear to demonstrate reduced insight into safety, having pulled out IV prior to PT arrival. Pt will benefit from frequent supervision of family at the time of discharge.   Recommendations for follow up therapy are one component of a multi-disciplinary discharge planning process, led by the attending physician.  Recommendations may be updated based on patient status, additional functional criteria and insurance authorization.  Follow Up Recommendations  Home health PT     Assistance Recommended at Discharge Frequent or constant Supervision/Assistance  Equipment Recommendations  Rolling walker (2 wheels)    Recommendations for Other Services       Precautions / Restrictions Precautions Precautions: Fall Restrictions Weight Bearing Restrictions: No     Mobility  Bed Mobility Overal bed mobility: Needs Assistance Bed Mobility: Supine to Sit     Supine to sit: Supervision;HOB elevated     General bed mobility comments: verbal cues to intiate    Transfers Overall transfer level: Needs assistance Equipment used: Rolling walker (2 wheels) Transfers: Sit to/from Stand Sit to Stand: Supervision     Step pivot transfers: Min assist     General transfer comment: min assist for stability with verbal cues for  safety    Ambulation/Gait Ambulation/Gait assistance: Min guard Gait Distance (Feet): 80 Feet Assistive device: Rolling walker (2 wheels) Gait Pattern/deviations: Step-through pattern Gait velocity: reduced Gait velocity interpretation: <1.8 ft/sec, indicate of risk for recurrent falls   General Gait Details: pt with slowed step-through gait, reduced cadence   Stairs             Wheelchair Mobility    Modified Rankin (Stroke Patients Only)       Balance Overall balance assessment: Needs assistance Sitting-balance support: No upper extremity supported;Feet supported Sitting balance-Leahy Scale: Fair Sitting balance - Comments: able to sit on EOB without assitance   Standing balance support: Bilateral upper extremity supported;Reliant on assistive device for balance Standing balance-Leahy Scale: Poor Standing balance comment: limited standing at sink for grooming tasks                            Cognition Arousal/Alertness: Awake/alert Behavior During Therapy: Impulsive Overall Cognitive Status: Impaired/Different from baseline Area of Impairment: Safety/judgement;Awareness                 Orientation Level: Disoriented to;Time;Situation Current Attention Level: Focused Memory: Decreased short-term memory;Decreased recall of precautions Following Commands: Follows one step commands with increased time;Follows multi-step commands inconsistently Safety/Judgement: Decreased awareness of safety Awareness: Emergent Problem Solving: Slow processing;Difficulty sequencing;Requires verbal cues General Comments: pt with IV pulled out upon PT arrival, blood on gown and pillows, had not notified any staff        Exercises      General Comments General comments (skin integrity, edema, etc.): VSS on RA      Pertinent Vitals/Pain Pain Assessment:  Faces Faces Pain Scale: Hurts little more Pain Location: feet Pain Descriptors / Indicators: Sore Pain  Intervention(s): Monitored during session    Home Living                          Prior Function            PT Goals (current goals can now be found in the care plan section) Acute Rehab PT Goals Patient Stated Goal: to go to Mom's house Progress towards PT goals: Progressing toward goals    Frequency    Min 3X/week      PT Plan Current plan remains appropriate    Co-evaluation              AM-PAC PT "6 Clicks" Mobility   Outcome Measure    Help needed moving from lying on your back to sitting on the side of a flat bed without using bedrails?: A Little Help needed moving to and from a bed to a chair (including a wheelchair)?: A Little Help needed standing up from a chair using your arms (e.g., wheelchair or bedside chair)?: A Little Help needed to walk in hospital room?: A Little Help needed climbing 3-5 steps with a railing? : A Lot 6 Click Score: 14    End of Session   Activity Tolerance: Patient limited by pain Patient left: in bed;with call bell/phone within reach;with nursing/sitter in room;with family/visitor present Nurse Communication: Mobility status PT Visit Diagnosis: Other abnormalities of gait and mobility (R26.89);Muscle weakness (generalized) (M62.81);Other symptoms and signs involving the nervous system (R29.898)     Time: 1102-1117 PT Time Calculation (min) (ACUTE ONLY): 15 min  Charges:  $Gait Training: 8-22 mins                     Zenaida Niece, PT, DPT Acute Rehabilitation Pager: [phone] Office [place] 05/21/2021, 2:53 PM

## 2021-05-21 NOTE — Progress Notes (Signed)
Occupational Therapy Treatment Patient Details Name: Martin Anthony MRN: [id number] DOB: 12-Jun-1966 Today's Date: 05/21/2021   History of present illness 55 y.o. male was found on the floor and patient was brought to the ER 05/12/21. Pt admitted for acute renal failure; encephalopathy; rhabdomyolysis; CIWA protocol. PMH alcoholic liver cirrhosis with portal hypertension and esophageal varices; ascending colon mass, paroxysmal atrial fibrillation, hyperglycemia, alcoholism.   OT comments  Patient received in bed and in good spirits and eager to work with OT. Patient was supervision to get to EOB and was able to donn socks with min assist. Patient was min assist to stand and walk to sink for self care task and was able to stand for hand washing and combing hair but required seated most tasks due to fatigue and unsteady balance. Patient able to ambulate to recliner. Acute OT to continue to follow.    Recommendations for follow up therapy are one component of a multi-disciplinary discharge planning process, led by the attending physician.  Recommendations may be updated based on patient status, additional functional criteria and insurance authorization.    Follow Up Recommendations  Home health OT    Assistance Recommended at Discharge Intermittent Supervision/Assistance  Equipment Recommendations  Other (comment) (TBD)    Recommendations for Other Services      Precautions / Restrictions Precautions Precautions: Fall Restrictions Weight Bearing Restrictions: No       Mobility Bed Mobility Overal bed mobility: Needs Assistance Bed Mobility: Supine to Sit     Supine to sit: Supervision;HOB elevated     General bed mobility comments: verbal cues to intiate    Transfers Overall transfer level: Needs assistance Equipment used: Rolling walker (2 wheels) Transfers: Sit to/from Stand;Bed to chair/wheelchair/BSC Sit to Stand: Min assist   Step pivot transfers: Min assist        General transfer comment: min assist for stability with verbal cues for safety     Balance Overall balance assessment: Needs assistance Sitting-balance support: Feet supported;No upper extremity supported Sitting balance-Leahy Scale: Fair Sitting balance - Comments: able to sit on EOB without assitance   Standing balance support: Bilateral upper extremity supported;Reliant on assistive device for balance Standing balance-Leahy Scale: Poor Standing balance comment: limited standing at sink for grooming tasks                           ADL either performed or assessed with clinical judgement   ADL Overall ADL's : Needs assistance/impaired     Grooming: Wash/dry hands;Wash/dry face;Oral care;Brushing hair;Sitting;Standing;Set up;Minimal assistance Grooming Details (indicate cue type and reason): performed oral care seated and washed hands and combed hair standing         Upper Body Dressing : Minimal assistance;Sitting Upper Body Dressing Details (indicate cue type and reason): downed gown Lower Body Dressing: Minimal assistance;Sitting/lateral leans Lower Body Dressing Details (indicate cue type and reason): donned socks             Functional mobility during ADLs: Rolling walker (2 wheels);Minimal assistance General ADL Comments: min assist for balance with mobility using RW due to unsteady balance    Extremity/Trunk Assessment Upper Extremity Assessment RUE Coordination: decreased fine motor LUE Coordination: decreased fine motor            Vision       Perception     Praxis      Cognition Arousal/Alertness: Awake/alert Behavior During Therapy: WFL for tasks assessed/performed Overall Cognitive Status: Within Functional Limits  for tasks assessed Area of Impairment: Orientation;Attention;Memory;Following commands;Safety/judgement;Awareness;Problem solving                 Orientation Level: Disoriented to;Time;Situation Current Attention  Level: Focused Memory: Decreased short-term memory;Decreased recall of precautions Following Commands: Follows one step commands with increased time;Follows multi-step commands inconsistently Safety/Judgement: Decreased awareness of safety;Decreased awareness of deficits Awareness: Intellectual Problem Solving: Slow processing;Difficulty sequencing;Requires verbal cues General Comments: believed he hasn't stood in 3 weeks          Exercises     Shoulder Instructions       General Comments      Pertinent Vitals/ Pain       Pain Assessment: Faces Faces Pain Scale: Hurts a little bit Pain Location: both legs Pain Descriptors / Indicators: Discomfort;Grimacing Pain Intervention(s): Monitored during session;Repositioned  Home Living                                          Prior Functioning/Environment              Frequency  Min 2X/week        Progress Toward Goals  OT Goals(current goals can now be found in the care plan section)  Progress towards OT goals: Progressing toward goals  Acute Rehab OT Goals Patient Stated Goal: go home with mom OT Goal Formulation: With patient Time For Goal Achievement: 05/31/21 Potential to Achieve Goals: Good ADL Goals Pt Will Perform Grooming: with supervision;standing Pt Will Perform Upper Body Dressing: sitting;with supervision Pt Will Perform Lower Body Dressing: with supervision Pt Will Transfer to Toilet: with supervision;ambulating;regular height toilet Pt Will Perform Toileting - Clothing Manipulation and hygiene: with supervision  Plan Discharge plan remains appropriate    Co-evaluation                 AM-PAC OT "6 Clicks" Daily Activity     Outcome Measure   Help from another person eating meals?: A Little Help from another person taking care of personal grooming?: A Little Help from another person toileting, which includes using toliet, bedpan, or urinal?: A Lot Help from another  person bathing (including washing, rinsing, drying)?: A Lot Help from another person to put on and taking off regular upper body clothing?: A Little Help from another person to put on and taking off regular lower body clothing?: A Lot 6 Click Score: 15    End of Session Equipment Utilized During Treatment: Gait belt;Rolling walker (2 wheels)  OT Visit Diagnosis: Unsteadiness on feet (R26.81);Other abnormalities of gait and mobility (R26.89);Muscle weakness (generalized) (M62.81);Other symptoms and signs involving cognitive function   Activity Tolerance Patient tolerated treatment well   Patient Left in chair;with call bell/phone within reach;with chair alarm set   Nurse Communication Mobility status        Time: 1638-4665 OT Time Calculation (min): 28 min  Charges: OT General Charges $OT Visit: 1 Visit OT Treatments $Self Care/Home Management : 23-37 mins  Lodema Hong, [place]  Pager [phone] Office [place] 05/21/2021, 11:28 AM

## 2021-05-21 NOTE — Telephone Encounter (Signed)
Transition Care Management Unsuccessful Follow-up Telephone Call  Date of discharge and from where:  05/21/21 from [facility]  Attempts:  1st Attempt  Reason for unsuccessful TCM follow-up call:  Left voice message

## 2021-05-21 NOTE — Discharge Instructions (Signed)
Follow with Primary MD [name], MD in 7 days we will also need to follow-up with a local nephrologist within 7 to 10 days.  Get CBC, CMP, magnesium, UA, 2 view Chest X ray -  checked next visit within 1 week by Primary MD    Activity: As tolerated with Full fall precautions use walker/cane & assistance as needed  Disposition Home   Diet: Heart Healthy   Special Instructions: If you have smoked or chewed Tobacco  in the last 2 yrs please stop smoking, stop any regular Alcohol  and or any Recreational drug use.  On your next visit with your primary care physician please Get Medicines reviewed and adjusted.  Please request your Prim.MD to go over all Hospital Tests and Procedure/Radiological results at the follow up, please get all Hospital records sent to your Prim MD by signing hospital release before you go home.  If you experience worsening of your admission symptoms, develop shortness of breath, life threatening emergency, suicidal or homicidal thoughts you must seek medical attention immediately by calling 911 or calling your MD immediately  if symptoms less severe.  You Must read complete instructions/literature along with all the possible adverse reactions/side effects for all the Medicines you take and that have been prescribed to you. Take any new Medicines after you have completely understood and accpet all the possible adverse reactions/side effects.

## 2021-05-21 NOTE — Discharge Summary (Signed)
Martin Anthony:[id number] DOB: 1966-01-01 DOA: 05/11/2021  PCP: [name], MD  Admit date: 05/11/2021  Discharge date: 05/21/2021  Admitted From: Home   Disposition:  Home   Recommendations for Outpatient Follow-up:   Follow up with PCP in 1-2 weeks  PCP Please obtain BMP/CBC, 2 view CXR in 1week,  (see Discharge instructions)   PCP Please follow up on the following pending results: Kindly follow CBC, CMP, magnesium level closely along with anemia panel.  Needs close outpatient nephrology and GI follow-up.   Home Health:  PT-OT Equipment/Devices: as below  Consultations: Renal Discharge Condition: Stable   CODE STATUS: Full   Diet Recommendation: Heart Healthy    Diet Order             Diet regular Room service appropriate? No; Fluid consistency: Thin  Diet effective now                    Chief Complaint  Patient presents with   Altered Mental Status     Brief history of present illness from the day of admission and additional interim summary    Patient is a 55 y.o. male with history of liver cirrhosis, EtOH use, PAF-who presented with confusion/found down at home (found by ex wife)-patient was found to have acute metabolic encephalopathy in the setting of AKI/hepatic encephalopathy.  Initially monitored at [facility] transferred to [facility] for potential dialysis.  See below for details.                                                                 Hospital Course   Acute metabolic encephalopathy: Multifactorial-due to uremic encephalopathy, hepatic encephalopathy, possible alcohol withdrawal symptoms.  CT head without any acute abnormalities.  He was placed on lactulose and Xifaxan and had 1 treatment of HD this admission, mentation now back to baseline.  Earlier had some DTs to which have  completely resolved.   AKI: Suspicion for hemodynamically mediated kidney injury-no hydronephrosis seen on CT imaging.  No proteinuria on UA.  Renal function seems to be improving-electrolytes stable-significant urine output overnight.  Did receive 1 session of hemodialysis on 11/30- Per Renal DC HD Cath and Foley on 05/18/21.  Note patient ended up pulling the Foley catheter out himself by mistake.  Renal function seems to be stable, discussed the case with nephrologist today Dr. Candiss Norse hold diuretics for now with close outpatient PCP and nephrology follow-up within a week of discharge.   Alcoholic hepatitis: And resolved counseled to quit alcohol now stable hematuria has resolved clinically.   Acute hematuria induced blood loss related anemia on top of chronic anemia.  1 unit of packed RBC transfusion on 05/20/2021, monitor hematuria.  Patient had pulled his Foley catheter by mistake on 05/18/2021.  Will continue to monitor  CBC   Rhabdomyolysis: Due to being down on the floor prior to this hospitalization-CK levels have almost normalized.   Liver cirrhosis/portal hypertension/esophageal varices: Overall stable-continue to hold diuretics as above due to renal insufficiency, low-dose beta-blocker.  Will benefit from outpatient GI follow-up to be arranged by PCP.   Hepatic encephalopathy: Much improved, placed on lactulose and counseled to comply.   Alcohol withdrawal EtOH use/alcohol withdrawal: Improved now and mentation  is close to baseline now.    History of Paroxysmal atrial fibrillation. Not a candidate for anticoagulation-due to ongoing alcohol abuse and high risk for falling and bleeding, continue beta-blocker.  PCP to monitor.    History of ascending colon tubular adenoma with high-grade dysplasia: Seen on colonoscopy in August 2022-never followed up with GI postdischarge-will need repeat endoscopic evaluation once more stable.  Requested PCP to arrange for outpatient GI follow-up    Depression: Resume Zoloft over the next few days.      Discharge diagnosis     Principal Problem:   ARF (acute renal failure) (HCC) Active Problems:   Essential hypertension   Diabetes mellitus without complication (HCC)   Alcohol abuse   Rhabdomyolysis   Elevated LFTs   Acute encephalopathy    Discharge instructions    Discharge Instructions     Discharge instructions   Complete by: As directed    Follow with Primary MD [name], MD in 7 days we will also need to follow-up with a local nephrologist within 7 to 10 days.  Get CBC, CMP, magnesium, UA, 2 view Chest X ray -  checked next visit within 1 week by Primary MD    Activity: As tolerated with Full fall precautions use walker/cane & assistance as needed  Disposition Home   Diet: Heart Healthy   Special Instructions: If you have smoked or chewed Tobacco  in the last 2 yrs please stop smoking, stop any regular Alcohol  and or any Recreational drug use.  On your next visit with your primary care physician please Get Medicines reviewed and adjusted.  Please request your Prim.MD to go over all Hospital Tests and Procedure/Radiological results at the follow up, please get all Hospital records sent to your Prim MD by signing hospital release before you go home.  If you experience worsening of your admission symptoms, develop shortness of breath, life threatening emergency, suicidal or homicidal thoughts you must seek medical attention immediately by calling 911 or calling your MD immediately  if symptoms less severe.  You Must read complete instructions/literature along with all the possible adverse reactions/side effects for all the Medicines you take and that have been prescribed to you. Take any new Medicines after you have completely understood and accpet all the possible adverse reactions/side effects.   Discharge wound care:   Complete by: As directed    Keep your skin clean and dry at all times.   Increase  activity slowly   Complete by: As directed        Discharge Medications   Allergies as of 05/21/2021   No Known Allergies      Medication List     STOP taking these medications    furosemide 40 MG tablet Commonly known as: LASIX   spironolactone 100 MG tablet Commonly known as: ALDACTONE       TAKE these medications    folic acid 1 MG tablet Commonly known as: FOLVITE Take 1 tablet (1 mg total) by mouth daily.   lactulose (encephalopathy) 10 GM/15ML Soln Commonly  known as: CHRONULAC Take 30 mLs (20 g total) by mouth 2 (two) times daily.   nadolol 20 MG tablet Commonly known as: Corgard Take 0.5 tablets (10 mg total) by mouth daily. What changed: how much to take   pantoprazole 40 MG tablet Commonly known as: PROTONIX Take 1 tablet (40 mg total) by mouth daily.   sertraline 50 MG tablet Commonly known as: ZOLOFT TAKE 1 TABLET BY MOUTH EVERY DAY   sodium bicarbonate 650 MG tablet Take 1 tablet (650 mg total) by mouth 2 (two) times daily.   thiamine 100 MG tablet Take 1 tablet (100 mg total) by mouth daily.               Durable Medical Equipment  (From admission, onward)           Start     Ordered   05/19/21 1537  For home use only DME 3 n 1  Once        05/19/21 1536   05/19/21 1536  For home use only DME Walker rolling  Once       Comments: 5 wheel  Question Answer Comment  Walker: With [place] Wheels   Patient needs a walker to treat with the following condition Weakness      05/19/21 1535              Discharge Care Instructions  (From admission, onward)           Start     Ordered   05/21/21 0000  Discharge wound care:       Comments: Keep your skin clean and dry at all times.   05/21/21 1003             Follow-up Information     [name], MD. Schedule an appointment as soon as possible for a visit in 1 week(s).   Specialty: Family Medicine Contact information: [place]  [place] [address] [phone]         Gean Quint, MD. Schedule an appointment as soon as possible for a visit in 1 week(s).   Specialty: Nephrology Contact information: [place] [address] [phone]         Carol Ada, MD. Schedule an appointment as soon as possible for a visit in 1 week(s).   Specialty: Gastroenterology Contact information: [address] [place] [address] [phone]                 Major procedures and Radiology Reports - PLEASE review detailed and final reports thoroughly  -       DG Chest 2 View  Result Date: 05/12/2021 CLINICAL DATA:  Anxious. History of hypertension and diabetes mellitus. EXAM: CHEST - 2 VIEW COMPARISON:  01/18/2019. FINDINGS: The heart size and mediastinal contours are within normal limits. Both lungs are clear. No acute osseous abnormality. IMPRESSION: No active cardiopulmonary disease. Electronically Signed   By: Brett Fairy M.D.   On: 05/12/2021 00:15   DG Pelvis 1-2 Views  Result Date: 05/12/2021 CLINICAL DATA:  Fall today EXAM: PELVIS - 1-2 VIEW COMPARISON:  None. FINDINGS: There is no evidence of pelvic fracture or diastasis. No pelvic bone lesions are seen. IMPRESSION: Negative. Electronically Signed   By: Jorje Guild M.D.   On: 05/12/2021 07:06   CT HEAD WO CONTRAST (5MM)  Result Date: 05/19/2021 CLINICAL DATA:  Head trauma, mod-severe Head injury, fall in room EXAM: CT HEAD WITHOUT CONTRAST TECHNIQUE: Contiguous axial  images were obtained from the base of the skull through the vertex without intravenous contrast. COMPARISON:  05/12/2021 FINDINGS: Brain: Normal anatomic configuration. Parenchymal volume loss appears slightly advanced in relation to the patient's given age. Mild periventricular white matter changes are present likely reflecting the sequela of small vessel ischemia. No abnormal intra or extra-axial mass lesion or fluid collection. No abnormal mass effect or midline  shift. No evidence of acute intracranial hemorrhage or infarct. Ventricular size is normal. Cerebellum unremarkable. Vascular: No asymmetric hyperdense vasculature at the skull base. Skull: Intact Sinuses/Orbits: Paranasal sinuses are clear. Orbits are unremarkable. Other: Mastoid air cells and middle ear cavities are clear. IMPRESSION: No acute intracranial abnormality. Mild senescent change, advanced in relation of the patient's stated age. This, however, appears stable since prior examination. Electronically Signed   By: Fidela Salisbury M.D.   On: 05/19/2021 03:46   CT Head Wo Contrast  Result Date: 05/12/2021 CLINICAL DATA:  Initial evaluation for acute altered mental status. EXAM: CT HEAD WITHOUT CONTRAST TECHNIQUE: Contiguous axial images were obtained from the base of the skull through the vertex without intravenous contrast. COMPARISON:  None available. FINDINGS: Brain: Diffuse prominence of the CSF containing spaces compatible with generalized cerebral atrophy, advanced for age. Patchy hypodensity involving the supratentorial cerebral white matter most consistent with chronic small vessel ischemic disease. No evidence for acute intracranial hemorrhage. No findings to suggest acute large vessel territory infarct. No mass lesion, midline shift, or mass effect. Ventricles are normal in size without evidence for hydrocephalus. No extra-axial fluid collection identified. Vascular: No hyperdense vessel identified.Calcified atherosclerosis present at the skull base. Skull: Scalp soft tissues demonstrate no acute abnormality. Calvarium intact. Sinuses/Orbits: Globes and orbital soft tissues within normal limits. Visualized paranasal sinuses are clear. No mastoid effusion. IMPRESSION: 1. No acute intracranial abnormality. 2. Generalized cerebral atrophy with chronic microvascular ischemic disease, advanced for age. Electronically Signed   By: Jeannine Boga M.D.   On: 05/12/2021 00:17   IR Fluoro Guide  CV Line Right  Result Date: 05/13/2021 INDICATION: 55 year old male presenting in acute renal failure requiring access for hemodialysis. EXAM: NON-TUNNELED CENTRAL VENOUS HEMODIALYSIS CATHETER PLACEMENT WITH ULTRASOUND AND FLUOROSCOPIC GUIDANCE COMPARISON:  None. MEDICATIONS: None FLUOROSCOPY TIME:  0 minutes, 18 seconds (3.7 mGy) COMPLICATIONS: None immediate. PROCEDURE: Informed written consent was obtained from the patient after a discussion of the risks, benefits, and alternatives to treatment. Questions regarding the procedure were encouraged and answered. The right neck and chest were prepped with chlorhexidine in a sterile fashion, and a sterile drape was applied covering the operative field. Maximum barrier sterile technique with sterile gowns and gloves were used for the procedure. A timeout was performed prior to the initiation of the procedure. After the overlying soft tissues were anesthetized, a small venotomy incision was created and a micropuncture kit was utilized to access the internal jugular vein. Real-time ultrasound guidance was utilized for vascular access including the acquisition of a permanent ultrasound image documenting patency of the accessed vessel. The microwire was utilized to measure appropriate catheter length. A stiff glidewire was advanced to the level of the IVC. Under fluoroscopic guidance, the venotomy was serially dilated, ultimately allowing placement of a 16 cm temporary Trialysis catheter with tip ultimately terminating within the superior aspect of the right atrium. Final catheter positioning was confirmed and documented with a spot radiographic image. The catheter aspirates and flushes normally. The catheter was flushed with appropriate volume heparin dwells. The catheter exit site was secured with a 0-Silk retention  suture. A dressing was placed. The patient tolerated the procedure well without immediate post procedural complication. IMPRESSION: Successful placement of  a right internal jugular approach 16 cm temporary dialysis catheter with tip terminating with in the superior aspect of the right atrium. The catheter is ready for immediate use. PLAN: This catheter may be converted to a tunneled dialysis catheter at a later date as indicated. Ruthann Cancer, MD [facility] Electronically Signed   By: Ruthann Cancer M.D.   On: 05/13/2021 14:13   IR US Guide Vasc Access Right  Result Date: 05/13/2021 INDICATION: 55 year old male presenting in acute renal failure requiring access for hemodialysis. EXAM: NON-TUNNELED CENTRAL VENOUS HEMODIALYSIS CATHETER PLACEMENT WITH ULTRASOUND AND FLUOROSCOPIC GUIDANCE COMPARISON:  None. MEDICATIONS: None FLUOROSCOPY TIME:  0 minutes, 18 seconds (3.7 mGy) COMPLICATIONS: None immediate. PROCEDURE: Informed written consent was obtained from the patient after a discussion of the risks, benefits, and alternatives to treatment. Questions regarding the procedure were encouraged and answered. The right neck and chest were prepped with chlorhexidine in a sterile fashion, and a sterile drape was applied covering the operative field. Maximum barrier sterile technique with sterile gowns and gloves were used for the procedure. A timeout was performed prior to the initiation of the procedure. After the overlying soft tissues were anesthetized, a small venotomy incision was created and a micropuncture kit was utilized to access the internal jugular vein. Real-time ultrasound guidance was utilized for vascular access including the acquisition of a permanent ultrasound image documenting patency of the accessed vessel. The microwire was utilized to measure appropriate catheter length. A stiff glidewire was advanced to the level of the IVC. Under fluoroscopic guidance, the venotomy was serially dilated, ultimately allowing placement of a 16 cm temporary Trialysis catheter with tip ultimately terminating  within the superior aspect of the right atrium. Final catheter positioning was confirmed and documented with a spot radiographic image. The catheter aspirates and flushes normally. The catheter was flushed with appropriate volume heparin dwells. The catheter exit site was secured with a 0-Silk retention suture. A dressing was placed. The patient tolerated the procedure well without immediate post procedural complication. IMPRESSION: Successful placement of a right internal jugular approach 16 cm temporary dialysis catheter with tip terminating with in the superior aspect of the right atrium. The catheter is ready for immediate use. PLAN: This catheter may be converted to a tunneled dialysis catheter at a later date as indicated. Ruthann Cancer, MD [facility] Electronically Signed   By: Ruthann Cancer M.D.   On: 05/13/2021 14:13   DG Chest Port 1 View  Result Date: 05/19/2021 CLINICAL DATA:  Shortness of breath and abdominal pain. EXAM: PORTABLE CHEST 1 VIEW COMPARISON:  None. FINDINGS: The heart size and mediastinal contours are within normal limits. Both lungs are clear. The visualized skeletal structures are unremarkable. IMPRESSION: No active disease. Electronically Signed   By: Kerby Moors M.D.   On: 05/19/2021 06:42   DG Abd Portable 1V  Result Date: 05/19/2021 CLINICAL DATA:  Abdominal pain. EXAM: PORTABLE ABDOMEN - 1 VIEW COMPARISON:  05/12/2021 FINDINGS: Bowel gas pattern appears nonspecific. There is a single dilated loop of small bowel within the right upper quadrant of the abdomen measuring 3.2 cm. Additional air-filled loops of small bowel are noted within the central abdomen. Gas and stool noted within the colon. IMPRESSION: Single dilated loop of small bowel noted within the right upper quadrant of the abdomen with gas noted  in normal caliber colon. Findings may reflect focal small bowel ileus or low-grade partial small bowel  obstruction. Electronically Signed   By: Kerby Moors M.D.   On: 05/19/2021 06:46   DG Foot 2 Views Left  Result Date: 05/12/2021 CLINICAL DATA:  Swelling and sores. EXAM: LEFT FOOT - 2 VIEW COMPARISON:  None. FINDINGS: Study limited by overlying artifact from clothing. Within this limitation, there is no evidence of fracture or dislocation. No gross bony destruction evident to suggest osteomyelitis. IMPRESSION: Negative. Electronically Signed   By: Misty Stanley M.D.   On: 05/12/2021 06:50   DG Foot 2 Views Right  Result Date: 05/12/2021 CLINICAL DATA:  Swelling and sores. EXAM: RIGHT FOOT - 2 VIEW COMPARISON:  None. FINDINGS: Study limited by superimposition of the distal toes on both images. Overlying artifact from clothing evident. Within these limitations, no fracture or dislocation. No gross bony destruction to suggest osteomyelitis. IMPRESSION: Negative. Electronically Signed   By: Misty Stanley M.D.   On: 05/12/2021 06:49   CT RENAL STONE STUDY  Result Date: 05/12/2021 CLINICAL DATA:  Flank pain. EXAM: CT ABDOMEN AND PELVIS WITHOUT CONTRAST TECHNIQUE: Multidetector CT imaging of the abdomen and pelvis was performed following the standard protocol without IV contrast. COMPARISON:  01/18/2019 FINDINGS: Lower chest: Small peripheral areas of ground-glass opacity are seen in the anterior right middle lobe (18/4) and anterior left upper lobe (image 5/series 4). Atelectasis noted posterior left base. No evidence for pleural effusion. Hepatobiliary: Nodular liver contour is compatible with cirrhosis. Gallbladder lumen shows high attenuation, likely sludge although stones not excluded. No intrahepatic or extrahepatic biliary dilation. Pancreas: No focal mass lesion. No dilatation of the main duct. No intraparenchymal cyst. No peripancreatic edema. Spleen: Splenic volume measures 532 cc with craniocaudal length of 13.6 cm, findings consistent with splenomegaly. Adrenals/Urinary Tract: No adrenal  nodule or mass. Kidneys unremarkable. No evidence for hydroureter. Foley catheter decompresses the urinary bladder. Gas in the bladder lumen is compatible with the instrumentation. Stomach/Bowel: Stomach is unremarkable. No gastric wall thickening. No evidence of outlet obstruction. Duodenum is normally positioned as is the ligament of Treitz. No small bowel wall thickening. No small bowel dilatation. The terminal ileum is normal. The appendix is normal. No gross colonic mass. No colonic wall thickening. Vascular/Lymphatic: There is moderate atherosclerotic calcification of the abdominal aorta without aneurysm. There is no gastrohepatic or hepatoduodenal ligament lymphadenopathy. No retroperitoneal or mesenteric lymphadenopathy. No pelvic sidewall lymphadenopathy. Reproductive: Unremarkable. Other: No intraperitoneal free fluid. Musculoskeletal: No worrisome lytic or sclerotic osseous abnormality. IMPRESSION: 1. No acute findings in the abdomen or pelvis. Specifically, no findings to explain the patient's history of flank pain. 2. Cirrhotic changes in the liver with splenomegaly. 3. Small peripheral areas of ground-glass opacity in the right middle lobe and left upper lobe. Imaging features are nonspecific but may be infectious/inflammatory. Follow-up chest CT in 3 months could be used to ensure resolution. 4. Aortic Atherosclerosis (ICD10-I70.0). Electronically Signed   By: Misty Stanley M.D.   On: 05/12/2021 06:57   IR Paracentesis  Result Date: 05/19/2021 INDICATION: Abdominal distension. Request for therapeutic and diagnostic paracentesis. EXAM: ULTRASOUND GUIDED  PARACENTESIS MEDICATIONS: 10 mL 1% lidocaine COMPLICATIONS: None immediate. PROCEDURE: Informed written consent was obtained from the patient after a discussion of the risks, benefits and alternatives to treatment. A timeout was performed prior to the initiation of the procedure. Initial ultrasound scanning demonstrates a large amount of ascites  within the right lower abdominal quadrant. The right lower abdomen was prepped  and draped in the usual sterile fashion. 1% lidocaine was used for local anesthesia. Following this, a 19 gauge, 7-cm, Yueh catheter was introduced. An ultrasound image was saved for documentation purposes. The paracentesis was performed. The catheter was removed and a dressing was applied. The patient tolerated the procedure well without immediate post procedural complication. FINDINGS: A total of approximately 1. 3 L of hazy pale yellow fluid was removed. Samples were sent to the laboratory as requested by the clinical team. IMPRESSION: Successful ultrasound-guided paracentesis yielding 1.3 liters of peritoneal fluid. Read by: Durenda Guthrie, PA-C Electronically Signed   By: Ruthann Cancer M.D.   On: 05/19/2021 17:46     Today   Subjective    Martin Anthony today has no headache,no chest abdominal pain,no new weakness tingling or numbness, feels much better wants to go home today.     Objective   Blood pressure 104/65, pulse 90, temperature 98.3 F (36.8 C), temperature source Oral, resp. rate 18, height _0  (1.905 m), weight 75.7 kg, SpO2 96 %.   Intake/Output Summary (Last 24 hours) at 05/21/2021 1010 Last data filed at 05/21/2021 0835 Gross per 24 hour  Intake 600 ml  Output 775 ml  Net -175 ml    Exam  Awake Alert, No new F.N deficits, Normal affect [place].AT,PERRAL Supple Neck,No JVD, No cervical lymphadenopathy appriciated.  Symmetrical Chest wall movement, Good air movement bilaterally, CTAB RRR,No Gallops,Rubs or new Murmurs, No Parasternal Heave +ve B.Sounds, Abd Soft, Non tender, No organomegaly appriciated, No rebound -guarding or rigidity. No Cyanosis, Clubbing or edema, No new Rash or bruise   Data Review   CBC w Diff:  Lab Results  Component Value Date   WBC 7.9 05/21/2021   HGB 7.5 (L) 05/21/2021   HCT 23.2 (L) 05/21/2021   PLT 99 (L) 05/21/2021   LYMPHOPCT 11 05/21/2021   MONOPCT 5  05/21/2021   EOSPCT 1 05/21/2021   BASOPCT 0 05/21/2021    CMP:  Lab Results  Component Value Date   NA 131 (L) 05/21/2021   K 3.7 05/21/2021   CL 100 05/21/2021   CO2 21 (L) 05/21/2021   BUN 47 (H) 05/21/2021   CREATININE 2.70 (H) 05/21/2021   PROT 5.1 (L) 05/21/2021   ALBUMIN 2.3 (L) 05/21/2021   BILITOT 1.1 05/21/2021   ALKPHOS 210 (H) 05/21/2021   AST 58 (H) 05/21/2021   ALT 85 (H) 05/21/2021  .   Total Time in preparing paper work, data evaluation and todays exam - 86 minutes  Lala Lund M.D on 05/21/2021 at 10:10 AM  [facility]

## 2021-05-21 NOTE — Progress Notes (Signed)
Ok to stop ceftriaxone for SBP prophylaxis per Dr. Candiss Norse.   Onnie Boer, PharmD, BCIDP, AAHIVP, CPP Infectious Disease Pharmacist 05/21/2021 8:55 AM

## 2021-05-22 ENCOUNTER — Telehealth: Payer: Self-pay

## 2021-05-22 LAB — BPAM RBC
Blood Product Expiration Date: 202212202359
Blood Product Expiration Date: 202212282359
ISSUE DATE / TIME: 202212030812
ISSUE DATE / TIME: 202212060405
Unit Type and Rh: 5100
Unit Type and Rh: 5100

## 2021-05-22 LAB — TYPE AND SCREEN
ABO/RH(D): O POS
Antibody Screen: NEGATIVE
Unit division: 0
Unit division: 0

## 2021-05-22 NOTE — Telephone Encounter (Signed)
Transition Care Management Unsuccessful Follow-up Telephone Call  Date of discharge and from where:  05/21/2021 Martin Anthony  Attempts:  2nd Attempt  Reason for unsuccessful TCM follow-up call:  Left voice message

## 2021-05-24 LAB — CULTURE, BODY FLUID W GRAM STAIN -BOTTLE: Culture: NO GROWTH

## 2021-06-20 ENCOUNTER — Inpatient Hospital Stay: Payer: Self-pay | Admitting: Family Medicine

## 2021-08-13 ENCOUNTER — Encounter (HOSPITAL_COMMUNITY): Payer: Self-pay | Admitting: Radiology

## 2021-08-16 ENCOUNTER — Other Ambulatory Visit: Payer: Self-pay | Admitting: Family Medicine

## 2021-08-18 ENCOUNTER — Other Ambulatory Visit: Payer: Self-pay

## 2021-08-18 DIAGNOSIS — F418 Other specified anxiety disorders: Secondary | ICD-10-CM

## 2021-08-18 MED ORDER — SERTRALINE HCL 50 MG PO TABS: 50.0000 mg | ORAL_TABLET | Freq: Every day | ORAL | 0 refills | Status: DC

## 2021-08-22 ENCOUNTER — Other Ambulatory Visit: Payer: Self-pay

## 2021-08-22 ENCOUNTER — Telehealth: Payer: Self-pay

## 2021-08-22 MED ORDER — FUROSEMIDE 40 MG PO TABS: 40.0000 mg | ORAL_TABLET | Freq: Every day | ORAL | 3 refills | Status: DC

## 2021-08-22 NOTE — Telephone Encounter (Signed)
Rx sent to pt pharmacy 

## 2021-08-22 NOTE — Telephone Encounter (Signed)
Left pt a message regarding refill ?

## 2021-08-22 NOTE — Telephone Encounter (Signed)
Call in Lasix 40 mg daily, #90 with 3 rf ?

## 2021-08-22 NOTE — Telephone Encounter (Signed)
Message sent to PCP for advise ?

## 2021-09-18 ENCOUNTER — Other Ambulatory Visit: Payer: Self-pay | Admitting: Family Medicine

## 2021-09-18 DIAGNOSIS — F418 Other specified anxiety disorders: Secondary | ICD-10-CM

## 2021-09-21 ENCOUNTER — Other Ambulatory Visit: Payer: Self-pay | Admitting: Family Medicine

## 2021-09-21 DIAGNOSIS — F418 Other specified anxiety disorders: Secondary | ICD-10-CM

## 2021-11-03 ENCOUNTER — Other Ambulatory Visit: Payer: Self-pay | Admitting: Family Medicine

## 2021-11-18 ENCOUNTER — Other Ambulatory Visit: Payer: Self-pay | Admitting: Family Medicine

## 2021-11-18 DIAGNOSIS — F418 Other specified anxiety disorders: Secondary | ICD-10-CM

## 2021-11-19 ENCOUNTER — Other Ambulatory Visit: Payer: Self-pay | Admitting: Family Medicine

## 2021-12-03 ENCOUNTER — Telehealth: Payer: Self-pay | Admitting: Family Medicine

## 2021-12-03 ENCOUNTER — Other Ambulatory Visit: Payer: Self-pay | Admitting: Family Medicine

## 2021-12-03 NOTE — Telephone Encounter (Signed)
Refill requested for sertraline (ZOLOFT) 50 MG tablet, furosemide (LASIX) 40 MG tablet, spironolactone (ALDACTONE) 100 MG tablet

## 2021-12-04 NOTE — Telephone Encounter (Signed)
Last office visit- 07/03/20 Patient has appointment for physical scheduled for 12/05/21. *Spironolactone 100 mg was discontinued on 05/21/21 by Dr. Candiss Norse.   Refill request has been sent requesting refill  Can patient receive refills now or after appointment?   Please advise.

## 2021-12-04 NOTE — Telephone Encounter (Signed)
Medication was discontinued on 05/21/21 by Dr. Candiss Norse.    Telephone message was sent requesting refill.      Last OV-07/03/2020 Next OV- 12/05/21

## 2021-12-05 ENCOUNTER — Ambulatory Visit (INDEPENDENT_AMBULATORY_CARE_PROVIDER_SITE_OTHER): Payer: Medicaid Other | Admitting: Family Medicine

## 2021-12-05 VITALS — BP 124/76 | HR 50 | Temp 99.0°F | Wt 171.0 lb

## 2021-12-05 DIAGNOSIS — E1149 Type 2 diabetes mellitus with other diabetic neurological complication: Secondary | ICD-10-CM | POA: Insufficient documentation

## 2021-12-05 DIAGNOSIS — N1832 Chronic kidney disease, stage 3b: Secondary | ICD-10-CM

## 2021-12-05 DIAGNOSIS — N183 Chronic kidney disease, stage 3 unspecified: Secondary | ICD-10-CM | POA: Insufficient documentation

## 2021-12-05 DIAGNOSIS — K4090 Unilateral inguinal hernia, without obstruction or gangrene, not specified as recurrent: Secondary | ICD-10-CM | POA: Insufficient documentation

## 2021-12-05 DIAGNOSIS — C182 Malignant neoplasm of ascending colon: Secondary | ICD-10-CM | POA: Insufficient documentation

## 2021-12-05 DIAGNOSIS — F1010 Alcohol abuse, uncomplicated: Secondary | ICD-10-CM

## 2021-12-05 DIAGNOSIS — K7031 Alcoholic cirrhosis of liver with ascites: Secondary | ICD-10-CM | POA: Insufficient documentation

## 2021-12-05 DIAGNOSIS — K7030 Alcoholic cirrhosis of liver without ascites: Secondary | ICD-10-CM

## 2021-12-05 DIAGNOSIS — F418 Other specified anxiety disorders: Secondary | ICD-10-CM

## 2021-12-05 DIAGNOSIS — I10 Essential (primary) hypertension: Secondary | ICD-10-CM

## 2021-12-05 DIAGNOSIS — D61818 Other pancytopenia: Secondary | ICD-10-CM

## 2021-12-05 MED ORDER — NADOLOL 20 MG PO TABS: 20.0000 mg | ORAL_TABLET | Freq: Every day | ORAL | 11 refills | Status: DC

## 2021-12-05 MED ORDER — SERTRALINE HCL 50 MG PO TABS: 50.0000 mg | ORAL_TABLET | Freq: Every day | ORAL | 3 refills | Status: DC

## 2021-12-05 MED ORDER — SPIRONOLACTONE 25 MG PO TABS: 25.0000 mg | ORAL_TABLET | Freq: Every day | ORAL | 11 refills | Status: DC

## 2021-12-05 NOTE — Progress Notes (Signed)
Subjective:    Patient ID: Martin Anthony, male    DOB: [birth year], 56 y.o.   MRN: [id number]  HPI Here to re-establish with [name] after living with his mother in [place] for about a year. He has moved back to [place]. 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 He developed type 2 diabetes and HTN as well. In November he was found lying on his floor and barely responsive. He was transported by EMS to the ED where he was found to be in acute hepatic encephalopathy and ARF. He had rhabdomyolosis and a pressure ulcer on his foot. His ammonia level was around 150 at one point, so he was started on Lactulose. Then he was found to have a large abdominal mass that was revealed to be an adenocarcinoma of the ascending colon. 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 He went to the ED in [place] on 11-04-21 for worsening ascites and scrotal swelling. He was started on Spironolactone to go with the Furosemide. At that time his WBC was 2.2 and the Hgb was 8.4. His creatinine was 1.85 and his lipase was 181. His ammonia level was 33. He recovered from this and has done well since then. He stopped taking Lactulose about 6 weeks ago at his doctors' advice due to short bowel syndrome and the risk of overwhelming his ostomy bag. He also says he has had a hernia in his groin for about 6 months. This is not painful. He says he stopped using all alcohol several years ago. He also describes feeling numbness and tingling in the entire right leg for the past 6 months. No back pain.   Review of Systems  Constitutional:  Positive for fatigue. Negative for fever.  Respiratory: Negative.    Cardiovascular: Negative.   Gastrointestinal:  Positive for abdominal pain. Negative for abdominal distention, anal bleeding, blood in stool, constipation, diarrhea, nausea and vomiting.  Genitourinary: Negative.   Neurological:  Positive for numbness.   Psychiatric/Behavioral: Negative.         Objective:   Physical Exam Constitutional:      Appearance: He is not ill-appearing.     Comments: Quite thin   Cardiovascular:     Rate and Rhythm: Normal rate. Rhythm irregular.     Pulses: Normal pulses.     Heart sounds: Normal heart sounds.  Pulmonary:     Effort: Pulmonary effort is normal.     Breath sounds: Normal breath sounds.  Abdominal:     General: Bowel sounds are normal. There is no distension.     Tenderness: There is no abdominal tenderness. There is no guarding or rebound.     Comments: Mildly distended, ostomy in place just to the right of the umbilicus. There is a large non-tender non-reducible direct right inguinal hernia   Neurological:     General: No focal deficit present.     Mental Status: He is alert and oriented to person, place, and time.  Psychiatric:        Mood and Affect: Mood normal.        Behavior: Behavior normal.        Thought Content: Thought content normal.           Assessment & Plan:  This patient has returned with long-standing alcoholic cirrhosis and ascites. He has HTN and type 2 diabetes. He recently had a right sided colectomy for colon adenocarcinoma. 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 He also now has a recurrent right inguinal hernia that will likely require surgery. We will refer him to [facility] to be established. We will refer him to Surgery for the inguinal hernia. We will get baseline labs today to include a CBC, BMET, A1c, and ammonia. His depression and anxiety seem to be stable. We spent a total of ( 35  ) minutes reviewing records and discussing these issues.  Gershon Crane, MD

## 2021-12-06 ENCOUNTER — Encounter: Payer: Self-pay | Admitting: Family Medicine

## 2021-12-06 LAB — CBC WITH DIFFERENTIAL/PLATELET
Absolute Monocytes: 395 cells/uL (ref 200–950)
Basophils Absolute: 42 cells/uL (ref 0–200)
Basophils Relative: 0.9 %
Eosinophils Absolute: 343 cells/uL (ref 15–500)
Eosinophils Relative: 7.3 %
HCT: 30.2 % — ABNORMAL LOW (ref 38.5–50.0)
Hemoglobin: 10.1 g/dL — ABNORMAL LOW (ref 13.2–17.1)
Lymphs Abs: 917 cells/uL (ref 850–3900)
MCH: 30.5 pg (ref 27.0–33.0)
MCHC: 33.4 g/dL (ref 32.0–36.0)
MCV: 91.2 fL (ref 80.0–100.0)
MPV: 11.9 fL (ref 7.5–12.5)
Monocytes Relative: 8.4 %
Neutro Abs: 3003 cells/uL (ref 1500–7800)
Neutrophils Relative %: 63.9 %
Platelets: 141 10*3/uL (ref 140–400)
RBC: 3.31 10*6/uL — ABNORMAL LOW (ref 4.20–5.80)
RDW: 13.4 % (ref 11.0–15.0)
Total Lymphocyte: 19.5 %
WBC: 4.7 10*3/uL (ref 3.8–10.8)

## 2021-12-06 LAB — AMMONIA: Ammonia: 72 umol/L (ref <=72)

## 2021-12-06 LAB — HEPATIC FUNCTION PANEL
AG Ratio: 1.1 (calc) (ref 1.0–2.5)
ALT: 18 U/L (ref 9–46)
AST: 39 U/L — ABNORMAL HIGH (ref 10–35)
Albumin: 4.2 g/dL (ref 3.6–5.1)
Alkaline phosphatase (APISO): 180 U/L — ABNORMAL HIGH (ref 35–144)
Bilirubin, Direct: 0.3 mg/dL — ABNORMAL HIGH (ref 0.0–0.2)
Globulin: 3.8 g/dL (calc) — ABNORMAL HIGH (ref 1.9–3.7)
Indirect Bilirubin: 0.7 mg/dL (calc) (ref 0.2–1.2)
Total Bilirubin: 1 mg/dL (ref 0.2–1.2)
Total Protein: 8 g/dL (ref 6.1–8.1)

## 2021-12-06 LAB — BASIC METABOLIC PANEL
BUN/Creatinine Ratio: 26 (calc) — ABNORMAL HIGH (ref 6–22)
BUN: 51 mg/dL — ABNORMAL HIGH (ref 7–25)
CO2: 22 mmol/L (ref 20–32)
Calcium: 9.3 mg/dL (ref 8.6–10.3)
Chloride: 109 mmol/L (ref 98–110)
Creat: 2 mg/dL — ABNORMAL HIGH (ref 0.70–1.30)
Glucose, Bld: 118 mg/dL — ABNORMAL HIGH (ref 65–99)
Potassium: 4.4 mmol/L (ref 3.5–5.3)
Sodium: 140 mmol/L (ref 135–146)

## 2021-12-06 LAB — TSH: TSH: 1.28 mIU/L (ref 0.40–4.50)

## 2021-12-18 ENCOUNTER — Other Ambulatory Visit: Payer: Self-pay

## 2021-12-18 MED ORDER — IRON (FERROUS SULFATE) 325 (65 FE) MG PO TABS: 325.0000 mg | ORAL_TABLET | Freq: Every day | ORAL | 3 refills | Status: AC

## 2022-01-06 ENCOUNTER — Other Ambulatory Visit: Payer: Self-pay

## 2022-01-06 DIAGNOSIS — R7989 Other specified abnormal findings of blood chemistry: Secondary | ICD-10-CM

## 2022-01-06 MED ORDER — RIFAXIMIN 550 MG PO TABS: 550.0000 mg | ORAL_TABLET | Freq: Two times a day (BID) | ORAL | 1 refills | Status: DC

## 2022-01-19 ENCOUNTER — Other Ambulatory Visit: Payer: Self-pay

## 2022-01-19 ENCOUNTER — Telehealth: Payer: Medicaid Other

## 2022-01-19 DIAGNOSIS — R7989 Other specified abnormal findings of blood chemistry: Secondary | ICD-10-CM

## 2022-01-19 MED ORDER — RIFAXIMIN 550 MG PO TABS: 550.0000 mg | ORAL_TABLET | Freq: Two times a day (BID) | ORAL | 1 refills | Status: DC

## 2022-01-19 NOTE — Telephone Encounter (Signed)
Caller states he is out of his medication and the office was suppose to call this RX on Friday. Caller states his mychart states that it was sent but the pharmacy doesn't have it. Medication is Xifaxan '550mg'$ . Symptoms include dizziness and confusion. He is having confusion (described as mild decreased in short term memory and decrease in focus) and irritability.  01/18/2022 1:35:45 PM Go to ED Now Ronnald Ramp, RN, Miranda  Comments User: Leverne Humbles, RN Date/Time Eilene Ghazi Time): 01/18/2022 1:40:20 PM Pt has been off medication for 3-4 days. He is having symptoms. Tried to find information on seriousness of missing consecutive doses on line but unable to locate anything. Reinforced recommendation of going to the ED.  Referrals GO TO FACILITY REFUSED  LVM instructions for pt to call back to determine if appt needed.

## 2022-01-20 ENCOUNTER — Other Ambulatory Visit: Payer: Self-pay

## 2022-01-20 DIAGNOSIS — R7989 Other specified abnormal findings of blood chemistry: Secondary | ICD-10-CM

## 2022-01-20 MED ORDER — RIFAXIMIN 550 MG PO TABS: 550.0000 mg | ORAL_TABLET | Freq: Two times a day (BID) | ORAL | 1 refills | Status: DC

## 2022-01-20 NOTE — Telephone Encounter (Signed)
I refilled this yesterday. Please check with his pharmacy

## 2022-01-20 NOTE — Telephone Encounter (Signed)
Lvm for patient, also sent My Chart message from 12/06/21, My Chart message.

## 2022-01-20 NOTE — Telephone Encounter (Signed)
Patient sent My Chart message on 01/19/22, from 12/06/21 original My Chart message requesting prescription for Xifaxan '550mg'$  to be resent to [facility] on [address], due to [facility] doesn't carry medication.  Spoke to [facility]ist at [facility], prescription was resent, pharmacist stated that cost of the prescription is $3000, with pharmacy discount cards.    Pharmacist requested medication substitution.

## 2022-01-20 NOTE — Telephone Encounter (Signed)
That was originally prescribed by his GI provider, so he should ask them about a substitute

## 2022-07-10 ENCOUNTER — Encounter: Payer: Self-pay | Admitting: Family Medicine

## 2022-09-13 ENCOUNTER — Other Ambulatory Visit: Payer: Self-pay | Admitting: Family Medicine

## 2022-09-17 ENCOUNTER — Other Ambulatory Visit: Payer: Self-pay | Admitting: Family Medicine

## 2022-10-28 ENCOUNTER — Other Ambulatory Visit: Payer: Self-pay | Admitting: Family Medicine

## 2022-10-28 DIAGNOSIS — F418 Other specified anxiety disorders: Secondary | ICD-10-CM

## 2022-11-23 ENCOUNTER — Other Ambulatory Visit: Payer: Self-pay | Admitting: Family Medicine

## 2022-12-22 ENCOUNTER — Other Ambulatory Visit: Payer: Self-pay | Admitting: Family Medicine

## 2023-01-11 ENCOUNTER — Telehealth: Payer: Self-pay | Admitting: Family Medicine

## 2023-01-11 NOTE — Telephone Encounter (Signed)
Pt has abdominal/groin area hernia requesting a referral. Has had them in the past, this one is in the same place as a previous hernia

## 2023-01-12 NOTE — Telephone Encounter (Signed)
He would need an OV for this  

## 2023-01-14 NOTE — Telephone Encounter (Signed)
Left message on machine for patient to return our call 

## 2023-01-20 ENCOUNTER — Emergency Department (HOSPITAL_COMMUNITY)
Admission: EM | Admit: 2023-01-20 | Discharge: 2023-01-20 | Disposition: A | Discharge status: Home / Self Care | Payer: Medicaid Other | Attending: Emergency Medicine | Admitting: Emergency Medicine

## 2023-01-20 ENCOUNTER — Encounter (HOSPITAL_COMMUNITY): Payer: Self-pay

## 2023-01-20 DIAGNOSIS — F10920 Alcohol use, unspecified with intoxication, uncomplicated: Secondary | ICD-10-CM

## 2023-01-20 DIAGNOSIS — F10120 Alcohol abuse with intoxication, uncomplicated: Secondary | ICD-10-CM | POA: Diagnosis present

## 2023-01-20 DIAGNOSIS — Y908 Blood alcohol level of 240 mg/100 ml or more: Secondary | ICD-10-CM | POA: Insufficient documentation

## 2023-01-20 LAB — CBC WITH DIFFERENTIAL/PLATELET
Abs Immature Granulocytes: 0.01 10*3/uL (ref 0.00–0.07)
Basophils Absolute: 0 10*3/uL (ref 0.0–0.1)
Basophils Relative: 2 %
Eosinophils Absolute: 0.1 10*3/uL (ref 0.0–0.5)
Eosinophils Relative: 4 %
HCT: 33.1 % — ABNORMAL LOW (ref 39.0–52.0)
Hemoglobin: 11.2 g/dL — ABNORMAL LOW (ref 13.0–17.0)
Immature Granulocytes: 0 %
Lymphocytes Relative: 17 %
Lymphs Abs: 0.5 10*3/uL — ABNORMAL LOW (ref 0.7–4.0)
MCH: 34.4 pg — ABNORMAL HIGH (ref 26.0–34.0)
MCHC: 33.8 g/dL (ref 30.0–36.0)
MCV: 101.5 fL — ABNORMAL HIGH (ref 80.0–100.0)
Monocytes Absolute: 0.3 10*3/uL (ref 0.1–1.0)
Monocytes Relative: 10 %
Neutro Abs: 1.8 10*3/uL (ref 1.7–7.7)
Neutrophils Relative %: 67 %
Platelets: 79 10*3/uL — ABNORMAL LOW (ref 150–400)
RBC: 3.26 MIL/uL — ABNORMAL LOW (ref 4.22–5.81)
RDW: 13.6 % (ref 11.5–15.5)
WBC: 2.7 10*3/uL — ABNORMAL LOW (ref 4.0–10.5)
nRBC: 0 % (ref 0.0–0.2)

## 2023-01-20 LAB — URINALYSIS, ROUTINE W REFLEX MICROSCOPIC
Bilirubin Urine: NEGATIVE
Glucose, UA: NEGATIVE mg/dL
Ketones, ur: NEGATIVE mg/dL
Leukocytes,Ua: NEGATIVE
Nitrite: NEGATIVE
Protein, ur: NEGATIVE mg/dL
Specific Gravity, Urine: 1.01 (ref 1.005–1.030)
pH: 5.5 (ref 5.0–8.0)

## 2023-01-20 LAB — COMPREHENSIVE METABOLIC PANEL
ALT: 43 U/L (ref 0–44)
AST: 148 U/L — ABNORMAL HIGH (ref 15–41)
Albumin: 3.3 g/dL — ABNORMAL LOW (ref 3.5–5.0)
Alkaline Phosphatase: 198 U/L — ABNORMAL HIGH (ref 38–126)
Anion gap: 17 — ABNORMAL HIGH (ref 5–15)
BUN: 9 mg/dL (ref 6–20)
CO2: 21 mmol/L — ABNORMAL LOW (ref 22–32)
Calcium: 9.1 mg/dL (ref 8.9–10.3)
Chloride: 104 mmol/L (ref 98–111)
Creatinine, Ser: 1.35 mg/dL — ABNORMAL HIGH (ref 0.61–1.24)
GFR, Estimated: >60 mL/min (ref >60)
Glucose, Bld: 108 mg/dL — ABNORMAL HIGH (ref 70–99)
Potassium: 3.7 mmol/L (ref 3.5–5.1)
Sodium: 142 mmol/L (ref 135–145)
Total Bilirubin: 3.1 mg/dL — ABNORMAL HIGH (ref 0.3–1.2)
Total Protein: 7.2 g/dL (ref 6.5–8.1)

## 2023-01-20 LAB — URINALYSIS, MICROSCOPIC (REFLEX)

## 2023-01-20 LAB — ETHANOL: Alcohol, Ethyl (B): 398 mg/dL (ref <10)

## 2023-01-20 LAB — PROTIME-INR
INR: 1.3 — ABNORMAL HIGH (ref 0.8–1.2)
Prothrombin Time: 16.1 seconds — ABNORMAL HIGH (ref 11.4–15.2)

## 2023-01-20 LAB — AMMONIA: Ammonia: 59 umol/L — ABNORMAL HIGH (ref 9–35)

## 2023-01-20 MED ORDER — SODIUM CHLORIDE 0.9 % IV BOLUS
1000.0000 mL | Freq: Once | INTRAVENOUS | Status: AC
  Administered 2023-01-20: 1000 mL via INTRAVENOUS

## 2023-01-20 NOTE — ED Notes (Signed)
Patient able to ambulate without assist. Patient states he feels better and is ready to call his uber.

## 2023-01-20 NOTE — Discharge Instructions (Signed)
Follow up with your family doc in the office.  
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
## 2023-01-23 ENCOUNTER — Other Ambulatory Visit: Payer: Self-pay | Admitting: Family Medicine

## 2023-02-02 ENCOUNTER — Ambulatory Visit (INDEPENDENT_AMBULATORY_CARE_PROVIDER_SITE_OTHER): Payer: Medicaid Other | Admitting: Family Medicine

## 2023-02-02 ENCOUNTER — Encounter: Payer: Self-pay | Admitting: Family Medicine

## 2023-02-02 VITALS — BP 126/78 | HR 76 | Temp 98.9°F | Wt 181.8 lb

## 2023-02-02 DIAGNOSIS — F418 Other specified anxiety disorders: Secondary | ICD-10-CM

## 2023-02-02 DIAGNOSIS — K4090 Unilateral inguinal hernia, without obstruction or gangrene, not specified as recurrent: Secondary | ICD-10-CM | POA: Diagnosis not present

## 2023-02-02 DIAGNOSIS — G621 Alcoholic polyneuropathy: Secondary | ICD-10-CM | POA: Diagnosis not present

## 2023-02-02 DIAGNOSIS — E1149 Type 2 diabetes mellitus with other diabetic neurological complication: Secondary | ICD-10-CM

## 2023-02-02 MED ORDER — SERTRALINE HCL 50 MG PO TABS
50.0000 mg | ORAL_TABLET | Freq: Every day | ORAL | 3 refills | Status: DC
Start: 2023-02-02 — End: 2023-07-09

## 2023-02-02 MED ORDER — GABAPENTIN 300 MG PO CAPS: 300.0000 mg | ORAL_CAPSULE | Freq: Three times a day (TID) | ORAL | 5 refills | Status: DC

## 2023-02-02 NOTE — Progress Notes (Signed)
   Subjective:    Patient ID: Martin Anthony, male    DOB: [birth year], 57 y.o.   MRN: [id number]  HPI Here to discuss a painful hernia and neuropathy. He has had two surgeries to repair the hernia, but each time they seem to fail. The hernia appeared about a year ago, and it has slowly gotten larger. He also describes burning pains in both feet and both lower legs to the mid shin level. He has some numbness but no weakness. This often interferes with his sleep. He still abuses alcohol ,and he was in the ED on 01-20-23 for alcohol intoxication. His blood level was 398 and the ammonia was 59. He splits his time now between living in [place] and in [place].    Review of Systems  Constitutional: Negative.   Respiratory: Negative.    Cardiovascular: Negative.   Gastrointestinal:  Positive for abdominal pain.  Neurological:  Positive for numbness. Negative for weakness.       Objective:   Physical Exam Constitutional:      Appearance: Normal appearance.  Cardiovascular:     Rate and Rhythm: Normal rate and regular rhythm.     Pulses: Normal pulses.     Heart sounds: Normal heart sounds.  Pulmonary:     Effort: Pulmonary effort is normal.     Breath sounds: Normal breath sounds.  Abdominal:     Comments: The is a large tender non-reducible right direct inguinal hernia   Neurological:     Mental Status: He is alert.           Assessment & Plan:  We will refer him to Surgery for the recurrent inguinal hernia. 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 Recheck as needed.  Gershon Crane, MD

## 2023-02-03 ENCOUNTER — Encounter: Payer: Self-pay | Admitting: Family Medicine

## 2023-02-03 DIAGNOSIS — K4090 Unilateral inguinal hernia, without obstruction or gangrene, not specified as recurrent: Secondary | ICD-10-CM

## 2023-02-13 ENCOUNTER — Emergency Department (HOSPITAL_COMMUNITY)
Admission: EM | Admit: 2023-02-13 | Discharge: 2023-02-13 | Discharge status: Against Medical Advice | Payer: Medicaid Other | Attending: Emergency Medicine | Admitting: Emergency Medicine

## 2023-02-13 ENCOUNTER — Encounter (HOSPITAL_COMMUNITY): Payer: Self-pay | Admitting: Emergency Medicine

## 2023-02-13 ENCOUNTER — Other Ambulatory Visit: Payer: Self-pay

## 2023-02-13 DIAGNOSIS — Z5321 Procedure and treatment not carried out due to patient leaving prior to being seen by health care provider: Secondary | ICD-10-CM | POA: Diagnosis not present

## 2023-02-13 DIAGNOSIS — R109 Unspecified abdominal pain: Secondary | ICD-10-CM | POA: Insufficient documentation

## 2023-02-13 LAB — CBC
HCT: 28.4 % — ABNORMAL LOW (ref 39.0–52.0)
Hemoglobin: 9.5 g/dL — ABNORMAL LOW (ref 13.0–17.0)
MCH: 35.3 pg — ABNORMAL HIGH (ref 26.0–34.0)
MCHC: 33.5 g/dL (ref 30.0–36.0)
MCV: 105.6 fL — ABNORMAL HIGH (ref 80.0–100.0)
Platelets: 88 10*3/uL — ABNORMAL LOW (ref 150–400)
RBC: 2.69 MIL/uL — ABNORMAL LOW (ref 4.22–5.81)
RDW: 15.5 % (ref 11.5–15.5)
WBC: 3 10*3/uL — ABNORMAL LOW (ref 4.0–10.5)
nRBC: 0 % (ref 0.0–0.2)

## 2023-02-13 LAB — COMPREHENSIVE METABOLIC PANEL
ALT: 41 U/L (ref 0–44)
AST: 130 U/L — ABNORMAL HIGH (ref 15–41)
Albumin: 3.2 g/dL — ABNORMAL LOW (ref 3.5–5.0)
Alkaline Phosphatase: 193 U/L — ABNORMAL HIGH (ref 38–126)
Anion gap: 9 (ref 5–15)
BUN: 12 mg/dL (ref 6–20)
CO2: 22 mmol/L (ref 22–32)
Calcium: 8.3 mg/dL — ABNORMAL LOW (ref 8.9–10.3)
Chloride: 105 mmol/L (ref 98–111)
Creatinine, Ser: 1.31 mg/dL — ABNORMAL HIGH (ref 0.61–1.24)
GFR, Estimated: >60 mL/min (ref >60)
Glucose, Bld: 117 mg/dL — ABNORMAL HIGH (ref 70–99)
Potassium: 3.3 mmol/L — ABNORMAL LOW (ref 3.5–5.1)
Sodium: 136 mmol/L (ref 135–145)
Total Bilirubin: 5.7 mg/dL — ABNORMAL HIGH (ref 0.3–1.2)
Total Protein: 7.4 g/dL (ref 6.5–8.1)

## 2023-02-13 LAB — LIPASE, BLOOD: Lipase: 46 U/L (ref 11–51)

## 2023-02-13 NOTE — ED Triage Notes (Signed)
Pt reports abdominal pain at his stoma site. Pt also reports he noticed bleeding from his stoma site upon waking from a nap. Pt reports PTA he emptied his bag that was "full of blood."

## 2023-02-13 NOTE — ED Notes (Signed)
Provider went to the pt room to do assessment but the pt is not in there will checked again later.

## 2023-02-13 NOTE — ED Provider Notes (Signed)
19: 56 patient is not in the room.  EMR has been reviewed.  Nursing will check to see if patient has left without being seen.   Arby Barrette, MD 02/13/23 [phone]

## 2023-02-22 ENCOUNTER — Other Ambulatory Visit: Payer: Self-pay | Admitting: Family Medicine

## 2023-03-04 ENCOUNTER — Telehealth: Payer: Self-pay | Admitting: Family Medicine

## 2023-03-04 NOTE — Telephone Encounter (Signed)
Pt is calling and [facility] is not in network with [facility] Medicaid and pt has right inguinal hernia.

## 2023-03-07 ENCOUNTER — Other Ambulatory Visit: Payer: Self-pay | Admitting: Family Medicine

## 2023-03-08 NOTE — Telephone Encounter (Signed)
Working with [facility] to get in [facility].

## 2023-03-12 MED ORDER — KETOCONAZOLE 2 % EX CREA: 1.0000 | TOPICAL_CREAM | Freq: Two times a day (BID) | CUTANEOUS | 5 refills | Status: DC

## 2023-03-12 NOTE — Telephone Encounter (Signed)
I placed a new referral to [facility]

## 2023-03-23 ENCOUNTER — Other Ambulatory Visit: Payer: Self-pay

## 2023-03-23 ENCOUNTER — Emergency Department (HOSPITAL_COMMUNITY): Payer: Medicaid Other

## 2023-03-23 ENCOUNTER — Encounter (HOSPITAL_COMMUNITY): Payer: Self-pay

## 2023-03-23 ENCOUNTER — Inpatient Hospital Stay (HOSPITAL_COMMUNITY)
Admission: EM | Admit: 2023-03-23 | Discharge: 2023-04-08 | DRG: 432 | Disposition: A | Discharge status: Home / Self Care | Payer: Medicaid Other | Attending: Family Medicine | Admitting: Family Medicine

## 2023-03-23 DIAGNOSIS — I129 Hypertensive chronic kidney disease with stage 1 through stage 4 chronic kidney disease, or unspecified chronic kidney disease: Secondary | ICD-10-CM | POA: Diagnosis present

## 2023-03-23 DIAGNOSIS — N179 Acute kidney failure, unspecified: Secondary | ICD-10-CM | POA: Diagnosis not present

## 2023-03-23 DIAGNOSIS — E872 Acidosis, unspecified: Secondary | ICD-10-CM | POA: Diagnosis not present

## 2023-03-23 DIAGNOSIS — N189 Chronic kidney disease, unspecified: Secondary | ICD-10-CM | POA: Diagnosis not present

## 2023-03-23 DIAGNOSIS — D62 Acute posthemorrhagic anemia: Secondary | ICD-10-CM | POA: Diagnosis present

## 2023-03-23 DIAGNOSIS — Z9049 Acquired absence of other specified parts of digestive tract: Secondary | ICD-10-CM

## 2023-03-23 DIAGNOSIS — K2991 Gastroduodenitis, unspecified, with bleeding: Principal | ICD-10-CM | POA: Diagnosis not present

## 2023-03-23 DIAGNOSIS — R578 Other shock: Secondary | ICD-10-CM | POA: Diagnosis present

## 2023-03-23 DIAGNOSIS — B955 Unspecified streptococcus as the cause of diseases classified elsewhere: Secondary | ICD-10-CM

## 2023-03-23 DIAGNOSIS — N182 Chronic kidney disease, stage 2 (mild): Secondary | ICD-10-CM | POA: Diagnosis not present

## 2023-03-23 DIAGNOSIS — D7589 Other specified diseases of blood and blood-forming organs: Secondary | ICD-10-CM | POA: Diagnosis present

## 2023-03-23 DIAGNOSIS — K7682 Hepatic encephalopathy: Secondary | ICD-10-CM | POA: Diagnosis present

## 2023-03-23 DIAGNOSIS — R160 Hepatomegaly, not elsewhere classified: Secondary | ICD-10-CM | POA: Diagnosis not present

## 2023-03-23 DIAGNOSIS — K7011 Alcoholic hepatitis with ascites: Secondary | ICD-10-CM | POA: Diagnosis present

## 2023-03-23 DIAGNOSIS — N184 Chronic kidney disease, stage 4 (severe): Secondary | ICD-10-CM | POA: Diagnosis present

## 2023-03-23 DIAGNOSIS — D649 Anemia, unspecified: Secondary | ICD-10-CM | POA: Diagnosis present

## 2023-03-23 DIAGNOSIS — K766 Portal hypertension: Secondary | ICD-10-CM | POA: Diagnosis present

## 2023-03-23 DIAGNOSIS — F10931 Alcohol use, unspecified with withdrawal delirium: Secondary | ICD-10-CM | POA: Insufficient documentation

## 2023-03-23 DIAGNOSIS — K7460 Unspecified cirrhosis of liver: Secondary | ICD-10-CM | POA: Diagnosis not present

## 2023-03-23 DIAGNOSIS — I10 Essential (primary) hypertension: Secondary | ICD-10-CM | POA: Diagnosis not present

## 2023-03-23 DIAGNOSIS — Z5982 Transportation insecurity: Secondary | ICD-10-CM

## 2023-03-23 DIAGNOSIS — I8501 Esophageal varices with bleeding: Secondary | ICD-10-CM | POA: Diagnosis present

## 2023-03-23 DIAGNOSIS — Z886 Allergy status to analgesic agent status: Secondary | ICD-10-CM

## 2023-03-23 DIAGNOSIS — Z8249 Family history of ischemic heart disease and other diseases of the circulatory system: Secondary | ICD-10-CM

## 2023-03-23 DIAGNOSIS — C182 Malignant neoplasm of ascending colon: Secondary | ICD-10-CM | POA: Diagnosis not present

## 2023-03-23 DIAGNOSIS — R7881 Bacteremia: Secondary | ICD-10-CM | POA: Diagnosis not present

## 2023-03-23 DIAGNOSIS — Z860101 Personal history of adenomatous and serrated colon polyps: Secondary | ICD-10-CM

## 2023-03-23 DIAGNOSIS — F1010 Alcohol abuse, uncomplicated: Secondary | ICD-10-CM | POA: Diagnosis present

## 2023-03-23 DIAGNOSIS — G9341 Metabolic encephalopathy: Secondary | ICD-10-CM | POA: Diagnosis not present

## 2023-03-23 DIAGNOSIS — Z23 Encounter for immunization: Secondary | ICD-10-CM

## 2023-03-23 DIAGNOSIS — Z79899 Other long term (current) drug therapy: Secondary | ICD-10-CM

## 2023-03-23 DIAGNOSIS — Z87891 Personal history of nicotine dependence: Secondary | ICD-10-CM

## 2023-03-23 DIAGNOSIS — R0602 Shortness of breath: Secondary | ICD-10-CM | POA: Diagnosis not present

## 2023-03-23 DIAGNOSIS — F418 Other specified anxiety disorders: Secondary | ICD-10-CM | POA: Diagnosis not present

## 2023-03-23 DIAGNOSIS — E871 Hypo-osmolality and hyponatremia: Secondary | ICD-10-CM | POA: Diagnosis present

## 2023-03-23 DIAGNOSIS — D696 Thrombocytopenia, unspecified: Secondary | ICD-10-CM | POA: Insufficient documentation

## 2023-03-23 DIAGNOSIS — E1122 Type 2 diabetes mellitus with diabetic chronic kidney disease: Secondary | ICD-10-CM | POA: Diagnosis present

## 2023-03-23 DIAGNOSIS — K3189 Other diseases of stomach and duodenum: Secondary | ICD-10-CM | POA: Diagnosis present

## 2023-03-23 DIAGNOSIS — Z85038 Personal history of other malignant neoplasm of large intestine: Secondary | ICD-10-CM

## 2023-03-23 DIAGNOSIS — F32A Depression, unspecified: Secondary | ICD-10-CM | POA: Diagnosis present

## 2023-03-23 DIAGNOSIS — R109 Unspecified abdominal pain: Secondary | ICD-10-CM | POA: Insufficient documentation

## 2023-03-23 DIAGNOSIS — Z932 Ileostomy status: Secondary | ICD-10-CM

## 2023-03-23 DIAGNOSIS — K7030 Alcoholic cirrhosis of liver without ascites: Secondary | ICD-10-CM | POA: Diagnosis present

## 2023-03-23 DIAGNOSIS — R579 Shock, unspecified: Secondary | ICD-10-CM | POA: Diagnosis not present

## 2023-03-23 DIAGNOSIS — E1149 Type 2 diabetes mellitus with other diabetic neurological complication: Secondary | ICD-10-CM | POA: Diagnosis present

## 2023-03-23 DIAGNOSIS — E1140 Type 2 diabetes mellitus with diabetic neuropathy, unspecified: Secondary | ICD-10-CM | POA: Diagnosis present

## 2023-03-23 DIAGNOSIS — K7031 Alcoholic cirrhosis of liver with ascites: Secondary | ICD-10-CM | POA: Diagnosis not present

## 2023-03-23 DIAGNOSIS — K922 Gastrointestinal hemorrhage, unspecified: Secondary | ICD-10-CM | POA: Diagnosis present

## 2023-03-23 DIAGNOSIS — F10231 Alcohol dependence with withdrawal delirium: Secondary | ICD-10-CM | POA: Diagnosis not present

## 2023-03-23 DIAGNOSIS — Z7141 Alcohol abuse counseling and surveillance of alcoholic: Secondary | ICD-10-CM

## 2023-03-23 DIAGNOSIS — B954 Other streptococcus as the cause of diseases classified elsewhere: Secondary | ICD-10-CM | POA: Diagnosis not present

## 2023-03-23 DIAGNOSIS — R1010 Upper abdominal pain, unspecified: Secondary | ICD-10-CM | POA: Diagnosis not present

## 2023-03-23 DIAGNOSIS — K219 Gastro-esophageal reflux disease without esophagitis: Secondary | ICD-10-CM | POA: Diagnosis present

## 2023-03-23 DIAGNOSIS — D508 Other iron deficiency anemias: Secondary | ICD-10-CM | POA: Diagnosis not present

## 2023-03-23 DIAGNOSIS — R1084 Generalized abdominal pain: Secondary | ICD-10-CM | POA: Diagnosis not present

## 2023-03-23 DIAGNOSIS — Z515 Encounter for palliative care: Secondary | ICD-10-CM | POA: Diagnosis not present

## 2023-03-23 DIAGNOSIS — F10939 Alcohol use, unspecified with withdrawal, unspecified: Secondary | ICD-10-CM

## 2023-03-23 DIAGNOSIS — I8500 Esophageal varices without bleeding: Secondary | ICD-10-CM | POA: Insufficient documentation

## 2023-03-23 DIAGNOSIS — M109 Gout, unspecified: Secondary | ICD-10-CM | POA: Diagnosis present

## 2023-03-23 DIAGNOSIS — R339 Retention of urine, unspecified: Secondary | ICD-10-CM | POA: Diagnosis not present

## 2023-03-23 DIAGNOSIS — G621 Alcoholic polyneuropathy: Secondary | ICD-10-CM | POA: Diagnosis present

## 2023-03-23 DIAGNOSIS — E785 Hyperlipidemia, unspecified: Secondary | ICD-10-CM | POA: Diagnosis present

## 2023-03-23 DIAGNOSIS — I8511 Secondary esophageal varices with bleeding: Secondary | ICD-10-CM | POA: Diagnosis present

## 2023-03-23 DIAGNOSIS — D61818 Other pancytopenia: Secondary | ICD-10-CM | POA: Diagnosis not present

## 2023-03-23 DIAGNOSIS — Z5941 Food insecurity: Secondary | ICD-10-CM

## 2023-03-23 DIAGNOSIS — F419 Anxiety disorder, unspecified: Secondary | ICD-10-CM | POA: Diagnosis present

## 2023-03-23 DIAGNOSIS — N171 Acute kidney failure with acute cortical necrosis: Secondary | ICD-10-CM | POA: Diagnosis not present

## 2023-03-23 DIAGNOSIS — Z7189 Other specified counseling: Secondary | ICD-10-CM | POA: Diagnosis not present

## 2023-03-23 DIAGNOSIS — Z833 Family history of diabetes mellitus: Secondary | ICD-10-CM

## 2023-03-23 DIAGNOSIS — Z811 Family history of alcohol abuse and dependence: Secondary | ICD-10-CM

## 2023-03-23 DIAGNOSIS — K4090 Unilateral inguinal hernia, without obstruction or gangrene, not specified as recurrent: Secondary | ICD-10-CM | POA: Diagnosis present

## 2023-03-23 DIAGNOSIS — Z781 Physical restraint status: Secondary | ICD-10-CM

## 2023-03-23 LAB — CBC
HCT: 21.4 % — ABNORMAL LOW (ref 39.0–52.0)
Hemoglobin: 6.9 g/dL — CL (ref 13.0–17.0)
MCH: 34.7 pg — ABNORMAL HIGH (ref 26.0–34.0)
MCHC: 32.2 g/dL (ref 30.0–36.0)
MCV: 107.5 fL — ABNORMAL HIGH (ref 80.0–100.0)
Platelets: 63 10*3/uL — ABNORMAL LOW (ref 150–400)
RBC: 1.99 MIL/uL — ABNORMAL LOW (ref 4.22–5.81)
RDW: 15.9 % — ABNORMAL HIGH (ref 11.5–15.5)
WBC: 3 10*3/uL — ABNORMAL LOW (ref 4.0–10.5)
nRBC: 0 % (ref 0.0–0.2)

## 2023-03-23 LAB — COMPREHENSIVE METABOLIC PANEL
ALT: 30 U/L (ref 0–44)
AST: 97 U/L — ABNORMAL HIGH (ref 15–41)
Albumin: 2.4 g/dL — ABNORMAL LOW (ref 3.5–5.0)
Alkaline Phosphatase: 180 U/L — ABNORMAL HIGH (ref 38–126)
Anion gap: 9 (ref 5–15)
BUN: 22 mg/dL — ABNORMAL HIGH (ref 6–20)
CO2: 18 mmol/L — ABNORMAL LOW (ref 22–32)
Calcium: 8.3 mg/dL — ABNORMAL LOW (ref 8.9–10.3)
Chloride: 109 mmol/L (ref 98–111)
Creatinine, Ser: 1.75 mg/dL — ABNORMAL HIGH (ref 0.61–1.24)
GFR, Estimated: 45 mL/min — ABNORMAL LOW (ref >60)
Glucose, Bld: 163 mg/dL — ABNORMAL HIGH (ref 70–99)
Potassium: 4.4 mmol/L (ref 3.5–5.1)
Sodium: 136 mmol/L (ref 135–145)
Total Bilirubin: 5.4 mg/dL — ABNORMAL HIGH (ref 0.3–1.2)
Total Protein: 6.1 g/dL — ABNORMAL LOW (ref 6.5–8.1)

## 2023-03-23 LAB — CBC WITH DIFFERENTIAL/PLATELET
Abs Immature Granulocytes: 0.04 10*3/uL (ref 0.00–0.07)
Basophils Absolute: 0 10*3/uL (ref 0.0–0.1)
Basophils Relative: 1 %
Eosinophils Absolute: 0 10*3/uL (ref 0.0–0.5)
Eosinophils Relative: 1 %
HCT: 23.8 % — ABNORMAL LOW (ref 39.0–52.0)
Hemoglobin: 7.8 g/dL — ABNORMAL LOW (ref 13.0–17.0)
Immature Granulocytes: 1 %
Lymphocytes Relative: 7 %
Lymphs Abs: 0.3 10*3/uL — ABNORMAL LOW (ref 0.7–4.0)
MCH: 35.6 pg — ABNORMAL HIGH (ref 26.0–34.0)
MCHC: 32.8 g/dL (ref 30.0–36.0)
MCV: 108.7 fL — ABNORMAL HIGH (ref 80.0–100.0)
Monocytes Absolute: 0.4 10*3/uL (ref 0.1–1.0)
Monocytes Relative: 10 %
Neutro Abs: 3.4 10*3/uL (ref 1.7–7.7)
Neutrophils Relative %: 80 %
Platelets: 89 10*3/uL — ABNORMAL LOW (ref 150–400)
RBC: 2.19 MIL/uL — ABNORMAL LOW (ref 4.22–5.81)
RDW: 14.2 % (ref 11.5–15.5)
WBC: 4.2 10*3/uL (ref 4.0–10.5)
nRBC: 0 % (ref 0.0–0.2)

## 2023-03-23 LAB — PROTIME-INR
INR: 1.6 — ABNORMAL HIGH (ref 0.8–1.2)
Prothrombin Time: 18.9 s — ABNORMAL HIGH (ref 11.4–15.2)

## 2023-03-23 LAB — PREPARE RBC (CROSSMATCH)

## 2023-03-23 LAB — I-STAT CHEM 8, ED
BUN: 19 mg/dL (ref 6–20)
Calcium, Ion: 1.17 mmol/L (ref 1.15–1.40)
Chloride: 111 mmol/L (ref 98–111)
Creatinine, Ser: 2 mg/dL — ABNORMAL HIGH (ref 0.61–1.24)
Glucose, Bld: 158 mg/dL — ABNORMAL HIGH (ref 70–99)
HCT: 24 % — ABNORMAL LOW (ref 39.0–52.0)
Hemoglobin: 8.2 g/dL — ABNORMAL LOW (ref 13.0–17.0)
Potassium: 4.8 mmol/L (ref 3.5–5.1)
Sodium: 141 mmol/L (ref 135–145)
TCO2: 17 mmol/L — ABNORMAL LOW (ref 22–32)

## 2023-03-23 LAB — ETHANOL: Alcohol, Ethyl (B): 24 mg/dL — ABNORMAL HIGH (ref <10)

## 2023-03-23 MED ORDER — HYDROMORPHONE HCL 1 MG/ML IJ SOLN
0.5000 mg | INTRAMUSCULAR | Status: DC | PRN
  Administered 2023-03-23 – 2023-03-26 (×10): 1 mg via INTRAVENOUS
  Administered 2023-03-27 (×2): 0.5 mg via INTRAVENOUS
  Administered 2023-03-28 – 2023-03-30 (×4): 1 mg via INTRAVENOUS
  Filled 2023-03-23 (×17): qty 1

## 2023-03-23 MED ORDER — IOHEXOL 350 MG/ML SOLN
100.0000 mL | Freq: Once | INTRAVENOUS | Status: AC | PRN
  Administered 2023-03-23: 100 mL via INTRAVENOUS

## 2023-03-23 MED ORDER — ONDANSETRON HCL 4 MG/2ML IJ SOLN: 4.0000 mg | Freq: Four times a day (QID) | INTRAMUSCULAR | Status: DC | PRN

## 2023-03-23 MED ORDER — SODIUM CHLORIDE 0.9% IV SOLUTION: Freq: Once | INTRAVENOUS | Status: AC

## 2023-03-23 MED ORDER — FENTANYL CITRATE PF 50 MCG/ML IJ SOSY
50.0000 ug | PREFILLED_SYRINGE | Freq: Once | INTRAMUSCULAR | Status: AC
  Administered 2023-03-23: 50 ug via INTRAVENOUS
  Filled 2023-03-23: qty 1

## 2023-03-23 MED ORDER — LORAZEPAM 1 MG PO TABS: 1.0000 mg | ORAL_TABLET | ORAL | Status: DC | PRN

## 2023-03-23 MED ORDER — SODIUM CHLORIDE 0.9 % IV BOLUS
1000.0000 mL | Freq: Once | INTRAVENOUS | Status: AC
  Administered 2023-03-23: 1000 mL via INTRAVENOUS

## 2023-03-23 MED ORDER — PANTOPRAZOLE 80MG IVPB - SIMPLE MED
80.0000 mg | Freq: Once | INTRAVENOUS | Status: AC
  Administered 2023-03-23: 80 mg via INTRAVENOUS
  Filled 2023-03-23: qty 80

## 2023-03-23 MED ORDER — MORPHINE SULFATE (PF) 4 MG/ML IV SOLN
4.0000 mg | Freq: Once | INTRAVENOUS | Status: AC
  Administered 2023-03-23: 4 mg via INTRAVENOUS
  Filled 2023-03-23: qty 1

## 2023-03-23 MED ORDER — SODIUM CHLORIDE 0.9% FLUSH
3.0000 mL | Freq: Two times a day (BID) | INTRAVENOUS | Status: DC
  Administered 2023-03-23 – 2023-04-06 (×17): 3 mL via INTRAVENOUS

## 2023-03-23 MED ORDER — LORAZEPAM 2 MG/ML IJ SOLN
0.0000 mg | Freq: Four times a day (QID) | INTRAMUSCULAR | Status: DC
  Administered 2023-03-24: 2 mg via INTRAVENOUS
  Administered 2023-03-24: 4 mg via INTRAVENOUS
  Administered 2023-03-25: 1 mg via INTRAVENOUS
  Filled 2023-03-23: qty 1
  Filled 2023-03-23: qty 2

## 2023-03-23 MED ORDER — LORAZEPAM 2 MG/ML IJ SOLN
1.0000 mg | INTRAMUSCULAR | Status: DC | PRN
  Administered 2023-03-25 – 2023-03-26 (×4): 2 mg via INTRAVENOUS
  Administered 2023-03-26: 4 mg via INTRAVENOUS
  Administered 2023-03-26: 2 mg via INTRAVENOUS
  Filled 2023-03-23 (×4): qty 1
  Filled 2023-03-23: qty 2
  Filled 2023-03-23: qty 1
  Filled 2023-03-23: qty 2

## 2023-03-23 MED ORDER — PANTOPRAZOLE INFUSION (NEW) - SIMPLE MED
8.0000 mg/h | INTRAVENOUS | Status: DC
  Administered 2023-03-23 – 2023-03-25 (×4): 8 mg/h via INTRAVENOUS
  Filled 2023-03-23 (×3): qty 100
  Filled 2023-03-23: qty 80
  Filled 2023-03-23: qty 100
  Filled 2023-03-23 (×2): qty 80
  Filled 2023-03-23: qty 100

## 2023-03-23 MED ORDER — SODIUM CHLORIDE 0.9 % IV SOLN: INTRAVENOUS | Status: AC

## 2023-03-23 MED ORDER — LORAZEPAM 2 MG/ML IJ SOLN: 0.0000 mg | Freq: Two times a day (BID) | INTRAMUSCULAR | Status: DC

## 2023-03-23 MED ORDER — ONDANSETRON HCL 4 MG PO TABS: 4.0000 mg | ORAL_TABLET | Freq: Four times a day (QID) | ORAL | Status: DC | PRN

## 2023-03-23 MED ORDER — THIAMINE HCL 100 MG/ML IJ SOLN
100.0000 mg | Freq: Every day | INTRAMUSCULAR | Status: DC
  Administered 2023-03-23 – 2023-03-26 (×4): 100 mg via INTRAVENOUS
  Filled 2023-03-23 (×4): qty 2

## 2023-03-23 NOTE — ED Provider Notes (Signed)
I saw and evaluated the patient, reviewed the resident's note and I agree with the findings and plan.    Martin Anthony is a 57 y.o. male history of varices, alcohol cirrhosis, colon cancer status post hemicolectomy end ileostomy here presenting with bright red blood in the ileostomy bag.  Patient has a ileostomy bag and he noticed this morning he started having abdominal cramps.  He then had several episodes of bright red blood through the ostomy.  Patient denies recent alcohol use.  Patient has some bright red blood in the ostomy.  CTA showed cirrhosis with moderate ascites and also inflammatory stranding around the duodenum.  There is no obvious varices visualized on CT scan.  Discussed case with on call GI (his GI is now in [facility]), Dr. Lorenso Quarry.  She agreed with IV Protonix bolus and drip.  She wants to hold off on octreotide since patient does not have varices right now.  She states that GI will see him tomorrow and plans for endoscopy.  Patient will be admitted for GI bleed.  Of note his hemoglobin is down to 7.8 and he received 1 unit PRBC.  CRITICAL CARE Performed by: Richardean Canal   Total critical care time: 37 minutes  Critical care time was exclusive of separately billable procedures and treating other patients.  Critical care was necessary to treat or prevent imminent or life-threatening deterioration.  Critical care was time spent personally by me on the following activities: development of treatment plan with patient and/or surrogate as well as nursing, discussions with consultants, evaluation of patient's response to treatment, examination of patient, obtaining history from patient or surrogate, ordering and performing treatments and interventions, ordering and review of laboratory studies, ordering and review of radiographic studies, pulse oximetry and re-evaluation of patient's condition.    Charlynne Pander, MD 03/23/23 2120

## 2023-03-23 NOTE — ED Notes (Signed)
ED TO INPATIENT HANDOFF REPORT  ED Nurse Name and Phone #: Zillah Alexie RN  S Name/Age/Gender Martin Anthony 57 y.o. male Room/Bed: RESB/RESB  Code Status   Code Status: Full Code  Home/SNF/Other Home Patient oriented to: self, place, time, and situation Is this baseline? Yes   Triage Complete: Triage complete  Chief Complaint Acute GI bleeding [K92.2]  Triage Note [facility] reports pt coming from home. Pt states his ileostomy bag has been filling with blood. Pt is having abdominal pain with it. Pt removed bag. Ileostomy is about a year old.    Allergies Allergies  Allergen Reactions   Acetaminophen     States he was told not to take it.    Ibuprofen Other (See Comments)    Liver sensitivity    Level of Care/Admitting Diagnosis ED Disposition     ED Disposition  Admit   Condition  --   Comment  Hospital Area: [facility] [[id number]]  Level of Care: Progressive [102]  Admit to Progressive based on following criteria: GI, ENDOCRINE disease patients with GI bleeding, acute liver failure or pancreatitis, stable with diabetic ketoacidosis or thyrotoxicosis (hypothyroid) state.  May admit patient to [facility] or [facility] if equivalent level of care is available:: No  Covid Evaluation: Asymptomatic - no recent exposure (last 10 days) testing not required  Diagnosis: Acute GI bleeding [086578]  Admitting Physician: Briscoe Deutscher [[id number]]  Attending Physician: Briscoe Deutscher [[id number]]  Certification:: I certify this patient will need inpatient services for at least 2 midnights  Expected Medical Readiness: 03/26/2023          B Medical/Surgery History Past Medical History:  Diagnosis Date   Anxiety    Ascites due to alcoholic cirrhosis (HCC)    Cirrhosis with alcoholism (HCC)    Depression    Diabetes mellitus without complication (HCC)    GERD (gastroesophageal reflux disease)    Gout    Hyperlipidemia    Hypertension    Primary  adenocarcinoma of ascending colon (HCC)    Substance abuse (HCC)    alcoholism    Type 2 diabetes mellitus with diabetic neuropathy (HCC)    Past Surgical History:  Procedure Laterality Date   BIOPSY  01/19/2019   Procedure: BIOPSY;  Surgeon: Jeani Hawking, MD;  Location: [facility];  Service: Endoscopy;;   COLON SURGERY  10/25/2021   right hemicolectomy with ostomy at [facility] in [place]   COLONOSCOPY N/A 01/19/2019   Procedure: COLONOSCOPY;  Surgeon: Jeani Hawking, MD;  Location: [facility];  Service: Endoscopy;  Laterality: N/A;   ESOPHAGOGASTRODUODENOSCOPY (EGD) WITH PROPOFOL N/A 01/19/2019   Procedure: ESOPHAGOGASTRODUODENOSCOPY (EGD) WITH PROPOFOL;  Surgeon: Jeani Hawking, MD;  Location: [facility];  Service: Endoscopy;  Laterality: N/A;   HEMOSTASIS CLIP PLACEMENT  01/19/2019   Procedure: HEMOSTASIS CLIP PLACEMENT;  Surgeon: Jeani Hawking, MD;  Location: [facility];  Service: Endoscopy;;   HERNIA REPAIR     umbilical    INGUINAL HERNIA REPAIR Bilateral    IR FLUORO GUIDE CV LINE RIGHT  05/13/2021   IR PARACENTESIS  05/19/2021   IR US GUIDE VASC ACCESS RIGHT  05/13/2021   POLYPECTOMY  01/19/2019   Procedure: POLYPECTOMY;  Surgeon: Jeani Hawking, MD;  Location: [facility];  Service: Endoscopy;;     A IV Location/Drains/Wounds Patient Lines/Drains/Airways Status     Active Line/Drains/Airways     Name Placement date Placement time Site Days   Peripheral IV 03/23/23 18 G Left Antecubital 03/23/23  1900  Antecubital  less than 1   Peripheral IV 03/23/23 18 G Anterior;Right Forearm 03/23/23  1929  Forearm  less than 1   Pressure Injury 05/13/21 Ankle Right;Lateral Unstageable - Full thickness tissue loss in which the base of the injury is covered by slough (yellow, tan, gray, green or brown) and/or eschar (tan, brown or black) in the wound bed. 05/13/21  0400  -- 679   Pressure Injury 05/13/21 Ankle Left;Lateral Unstageable - Full thickness tissue loss in which the  base of the injury is covered by slough (yellow, tan, gray, green or brown) and/or eschar (tan, brown or black) in the wound bed. 05/13/21  0400  -- 679   Pressure Injury 05/13/21 Coccyx Unstageable - Full thickness tissue loss in which the base of the injury is covered by slough (yellow, tan, gray, green or brown) and/or eschar (tan, brown or black) in the wound bed. 05/13/21  0400  -- 679   Pressure Injury 05/13/21 Buttocks Left;Lower 05/13/21  0400  -- 679   Pressure Injury 05/13/21 Buttocks Right 05/13/21  0400  -- 679            Intake/Output Last 24 hours  Intake/Output Summary (Last 24 hours) at 03/23/2023 2207 Last data filed at 03/23/2023 2140 Gross per 24 hour  Intake 1295.83 ml  Output --  Net 1295.83 ml    Labs/Imaging Results for orders placed or performed during the hospital encounter of 03/23/23 (from the past 48 hour(s))  Type and screen     Status: None (Preliminary result)   Collection Time: 03/23/23  7:05 PM  Result Value Ref Range   ABO/RH(D) O POS    Antibody Screen NEG    Sample Expiration      03/26/2023,2359 Performed at [facility], [address], [place], [place] [address]    Unit Number [id number]    Blood Component Type RBC LR PHER1    Unit division 00    Status of Unit ISSUED    Unit tag comment EMERGENCY RELEASE    Transfusion Status OK TO TRANSFUSE    Crossmatch Result COMPATIBLE    Unit Number [id number]    Blood Component Type RED CELLS,LR    Unit division 00    Status of Unit ALLOCATED    Transfusion Status OK TO TRANSFUSE    Crossmatch Result Compatible   Prepare RBC     Status: None   Collection Time: 03/23/23  7:15 PM  Result Value Ref Range   Order Confirmation      ORDER PROCESSED BY BLOOD BANK Performed at [facility], [address] [place], [place] [address]   CBC with Differential     Status: Abnormal   Collection Time: 03/23/23  7:30 PM  Result Value Ref Range   WBC 4.2 4.0  - 10.5 K/uL   RBC 2.19 (L) 4.22 - 5.81 MIL/uL   Hemoglobin 7.8 (L) 13.0 - 17.0 g/dL   HCT 28.4 (L) 13.2 - 44.0 %   MCV 108.7 (H) 80.0 - 100.0 fL   MCH 35.6 (H) 26.0 - 34.0 pg   MCHC 32.8 30.0 - 36.0 g/dL   RDW 10.2 72.5 - 36.6 %   Platelets 89 (L) 150 - 400 K/uL    Comment: Immature Platelet Fraction may be clinically indicated, consider ordering this additional test [id number] CONSISTENT WITH PREVIOUS RESULT REPEATED TO VERIFY    nRBC 0.0 0.0 - 0.2 %   Neutrophils Relative % 80 %  Neutro Abs 3.4 1.7 - 7.7 K/uL   Lymphocytes Relative 7 %   Lymphs Abs 0.3 (L) 0.7 - 4.0 K/uL   Monocytes Relative 10 %   Monocytes Absolute 0.4 0.1 - 1.0 K/uL   Eosinophils Relative 1 %   Eosinophils Absolute 0.0 0.0 - 0.5 K/uL   Basophils Relative 1 %   Basophils Absolute 0.0 0.0 - 0.1 K/uL   Immature Granulocytes 1 %   Abs Immature Granulocytes 0.04 0.00 - 0.07 K/uL    Comment: Performed at [facility], [address], [place], [place] [address]  Comprehensive metabolic panel     Status: Abnormal   Collection Time: 03/23/23  7:30 PM  Result Value Ref Range   Sodium 136 135 - 145 mmol/L   Potassium 4.4 3.5 - 5.1 mmol/L   Chloride 109 98 - 111 mmol/L   CO2 18 (L) 22 - 32 mmol/L   Glucose, Bld 163 (H) 70 - 99 mg/dL    Comment: Glucose reference range applies only to samples taken after fasting for at least 8 hours.   BUN 22 (H) 6 - 20 mg/dL   Creatinine, Ser 6.04 (H) 0.61 - 1.24 mg/dL   Calcium 8.3 (L) 8.9 - 10.3 mg/dL   Total Protein 6.1 (L) 6.5 - 8.1 g/dL   Albumin 2.4 (L) 3.5 - 5.0 g/dL   AST 97 (H) 15 - 41 U/L   ALT 30 0 - 44 U/L   Alkaline Phosphatase 180 (H) 38 - 126 U/L   Total Bilirubin 5.4 (H) 0.3 - 1.2 mg/dL   GFR, Estimated 45 (L) >60 mL/min    Comment: (NOTE) Calculated using the CKD-EPI Creatinine Equation (2021)    Anion gap 9 5 - 15    Comment: Performed at [facility], [address], [place], [place] [address]  Protime-INR      Status: Abnormal   Collection Time: 03/23/23  7:30 PM  Result Value Ref Range   Prothrombin Time 18.9 (H) 11.4 - 15.2 seconds   INR 1.6 (H) 0.8 - 1.2    Comment: (NOTE) INR goal varies based on device and disease states. Performed at [facility], [address], [place], [place] [address]   Ethanol     Status: Abnormal   Collection Time: 03/23/23  7:36 PM  Result Value Ref Range   Alcohol, Ethyl (B) 24 (H) <10 mg/dL    Comment: (NOTE) Lowest detectable limit for serum alcohol is 10 mg/dL.  For medical purposes only. Performed at [facility], [address], [place], [place] [address]   I-stat chem 8, ED (not at [facility], [facility] or [facility])     Status: Abnormal   Collection Time: 03/23/23  7:46 PM  Result Value Ref Range   Sodium 141 135 - 145 mmol/L   Potassium 4.8 3.5 - 5.1 mmol/L   Chloride 111 98 - 111 mmol/L   BUN 19 6 - 20 mg/dL   Creatinine, Ser 6.21 (H) 0.61 - 1.24 mg/dL   Glucose, Bld 308 (H) 70 - 99 mg/dL    Comment: Glucose reference range applies only to samples taken after fasting for at least 8 hours.   Calcium, Ion 1.17 1.15 - 1.40 mmol/L   TCO2 17 (L) 22 - 32 mmol/L   Hemoglobin 8.2 (L) 13.0 - 17.0 g/dL   HCT 65.7 (L) 84.6 - 96.2 %   CT Angio Abd/Pel W and/or Wo Contrast  Result Date: 03/23/2023 CLINICAL DATA:  Lower gastrointestinal hemorrhage EXAM: CTA ABDOMEN AND PELVIS WITHOUT AND WITH CONTRAST TECHNIQUE: Multidetector CT imaging of the abdomen and pelvis was performed using the standard protocol during bolus administration of intravenous contrast. Multiplanar reconstructed images and MIPs were obtained and reviewed to evaluate the vascular anatomy. RADIATION DOSE REDUCTION: This exam was performed according to the departmental dose-optimization program which includes automated exposure control, adjustment of the mA and/or kV according to patient size and/or use of iterative reconstruction technique. CONTRAST:  OMNIPAQUE  IOHEXOL 350 MG/ML SOLN COMPARISON:  05/12/2021 FINDINGS: VASCULAR Aorta: Normal caliber aorta without aneurysm, dissection, vasculitis or significant stenosis. Mild atherosclerotic calcification Celiac: Patent without evidence of aneurysm, dissection, vasculitis or significant stenosis. SMA: Patent without evidence of aneurysm, dissection, vasculitis or significant stenosis. Renals: Both renal arteries are patent without evidence of aneurysm, dissection, vasculitis, fibromuscular dysplasia or significant stenosis. IMA: Patent without evidence of aneurysm, dissection, vasculitis or significant stenosis. Inflow: Patent without evidence of aneurysm, dissection, vasculitis or significant stenosis. Proximal Outflow: Bilateral common femoral and visualized portions of the superficial and profunda femoral arteries are patent without evidence of aneurysm, dissection, vasculitis or significant stenosis. Veins: Portosystemic collateralization is seen between the inferior mesenteric vein and left renal vein. The abdominal venous vasculature is otherwise unremarkable. Review of the MIP images confirms the above findings. NON-VASCULAR Lower chest: No acute abnormality. Gastroesophageal varices noted. Moderate coronary artery calcification. Hypoattenuation of the cardiac blood pool is in keeping with at least moderate anemia. Hepatobiliary: Advanced cirrhosis. No enhancing intrahepatic mass. No intra or extrahepatic biliary ductal dilation. Gallbladder unremarkable. Pancreas: There is inflammatory stranding and wall thickening involving the second portion the duodenum asymmetrically, particularly involving the pancreatico duodenal groove. This may reflect changes of duodenal ulceration or duodenitis. The pancreas itself is unremarkable. Spleen: Moderate splenomegaly with the spleen measuring 17.8 cm in greatest dimension. No intrasplenic lesions are seen. Splenic vein is patent. Adrenals/Urinary Tract: The adrenal glands are  unremarkable. The kidneys are normal in size and position. Bilateral nonobstructing renal calculi are identified measuring up to 7 mm within the lower pole the right kidney. No hydronephrosis. No ureteral calculi. The bladder is unremarkable. Stomach/Bowel: Moderate ascites. Surgical changes of proximal colectomy are identified with a stable margin seen involving the proximal transverse colon within the right upper quadrant. End ileostomy is seen within the right mid abdomen. Circumferential mucosal thickening involving the proximal jejunum just beyond the ligament of Treitz may relate to changes of an infectious or inflammatory enteritis, malabsorption, or portal enteritis. No evidence of obstruction. No free intraperitoneal gas. No active gastrointestinal hemorrhage identified., Lymphatic: No pathologic adenopathy within the abdomen and pelvis. Reproductive: Prostate is unremarkable. Other: Small right inguinal hernia containing ascitic fluid. Musculoskeletal: No acute bone abnormality. No lytic or blastic bone lesion. IMPRESSION: 1. No active gastrointestinal hemorrhage identified. 2. Advanced cirrhosis with portal venous hypertension including splenomegaly, gastroesophageal and inferior mesenteric-renal varices, and moderate ascites. 3. Inflammatory stranding and wall thickening involving the second portion the duodenum asymmetrically, particularly involving the pancreatico duodenal groove. This may reflect changes of duodenal ulceration or duodenitis. Correlation with endoscopy may be helpful for further evaluation. 4. Circumferential mucosal thickening involving the proximal jejunum just beyond the ligament of Treitz may relate to changes of an infectious or inflammatory enteritis, malabsorption, or portal enteritis. 5. Bilateral nonobstructing renal calculi. 6. Moderate coronary artery calcification. 7. Hypoattenuation of the cardiac blood pool in keeping with at least moderate anemia. Aortic Atherosclerosis  (ICD10-I70.0). Electronically Signed   By: Lyda Kalata.D.  On: 03/23/2023 20:54   CT HEAD WO CONTRAST ( )  Result Date: 03/23/2023 CLINICAL DATA:  Head trauma EXAM: CT HEAD WITHOUT CONTRAST CT CERVICAL SPINE WITHOUT CONTRAST TECHNIQUE: Multidetector CT imaging of the head and cervical spine was performed following the standard protocol without intravenous contrast. Multiplanar CT image reconstructions of the cervical spine were also generated. RADIATION DOSE REDUCTION: This exam was performed according to the departmental dose-optimization program which includes automated exposure control, adjustment of the mA and/or kV according to patient size and/or use of iterative reconstruction technique. COMPARISON:  CT brain 05/12/2021 FINDINGS: CT HEAD FINDINGS Brain: No acute territorial infarction, hemorrhage, or intracranial mass. Mild chronic small vessel ischemic changes of the white matter. Mild atrophy. Stable ventricle size. Vascular: No hyperdense vessels.  No unexpected calcification Skull: Normal. Negative for fracture or focal lesion. Sinuses/Orbits: No acute finding. Other: None CT CERVICAL SPINE FINDINGS Alignment: Trace anterolisthesis C7 on T1. Facet alignment is maintained Skull base and vertebrae: No acute fracture. No primary bone lesion or focal pathologic process. Soft tissues and spinal canal: No prevertebral fluid or swelling. No visible canal hematoma. Disc levels: Moderate disc space narrowing C5-C6, C6-C7 and C7-T1. Facet degenerative changes. Bilateral foraminal narrowing C5-C6 and C6-C7 Upper chest: Negative. Other: None IMPRESSION: No CT evidence for acute intracranial abnormality. Atrophy and chronic small vessel ischemic changes of the white matter. Degenerative changes of the cervical spine. No acute osseous abnormality. Electronically Signed   By: Jasmine Pang M.D.   On: 03/23/2023 20:49   CT Cervical Spine Wo Contrast  Result Date: 03/23/2023 CLINICAL DATA:  Head trauma  EXAM: CT HEAD WITHOUT CONTRAST CT CERVICAL SPINE WITHOUT CONTRAST TECHNIQUE: Multidetector CT imaging of the head and cervical spine was performed following the standard protocol without intravenous contrast. Multiplanar CT image reconstructions of the cervical spine were also generated. RADIATION DOSE REDUCTION: This exam was performed according to the departmental dose-optimization program which includes automated exposure control, adjustment of the mA and/or kV according to patient size and/or use of iterative reconstruction technique. COMPARISON:  CT brain 05/12/2021 FINDINGS: CT HEAD FINDINGS Brain: No acute territorial infarction, hemorrhage, or intracranial mass. Mild chronic small vessel ischemic changes of the white matter. Mild atrophy. Stable ventricle size. Vascular: No hyperdense vessels.  No unexpected calcification Skull: Normal. Negative for fracture or focal lesion. Sinuses/Orbits: No acute finding. Other: None CT CERVICAL SPINE FINDINGS Alignment: Trace anterolisthesis C7 on T1. Facet alignment is maintained Skull base and vertebrae: No acute fracture. No primary bone lesion or focal pathologic process. Soft tissues and spinal canal: No prevertebral fluid or swelling. No visible canal hematoma. Disc levels: Moderate disc space narrowing C5-C6, C6-C7 and C7-T1. Facet degenerative changes. Bilateral foraminal narrowing C5-C6 and C6-C7 Upper chest: Negative. Other: None IMPRESSION: No CT evidence for acute intracranial abnormality. Atrophy and chronic small vessel ischemic changes of the white matter. Degenerative changes of the cervical spine. No acute osseous abnormality. Electronically Signed   By: Jasmine Pang M.D.   On: 03/23/2023 20:49    Pending Labs Unresulted Labs (From admission, onward)     Start     Ordered   03/24/23 0500  HIV Antibody (routine testing w rflx)  (HIV Antibody (Routine testing w reflex) panel)  Tomorrow morning,   R        03/23/23 2144   03/24/23 0500  Phosphorus   Tomorrow morning,   R        03/23/23 2144   03/24/23 0500  Magnesium  Tomorrow morning,  R        03/23/23 2144   03/24/23 0500  Comprehensive metabolic panel  Daily,   R      03/23/23 2144   03/23/23 2143  CBC  Now then every 6 hours,   R (with TIMED occurrences)      03/23/23 2144            Vitals/Pain Today's Vitals   03/23/23 2114 03/23/23 2115 03/23/23 2140 03/23/23 2205  BP:  (!) 157/78 128/70   Pulse:  73 66   Resp:  (!) 22 18   Temp:   98 F (36.7 C)   TempSrc:   Oral   SpO2:  100%    PainSc: 9    9     Isolation Precautions No active isolations  Medications Medications  pantoprozole (PROTONIX) 80 mg /NS 100 mL infusion (8 mg/hr Intravenous New Bag/Given 03/23/23 2147)  LORazepam (ATIVAN) tablet 1-4 mg (has no administration in time range)    Or  LORazepam (ATIVAN) injection 1-4 mg (has no administration in time range)  thiamine (VITAMIN B1) injection 100 mg (100 mg Intravenous Given 03/23/23 2153)  LORazepam (ATIVAN) injection 0-4 mg ( Intravenous Not Given 03/23/23 2204)    Followed by  LORazepam (ATIVAN) injection 0-4 mg (has no administration in time range)  sodium chloride flush (NS) 0.9 % injection 3 mL (3 mLs Intravenous Given 03/23/23 2150)  HYDROmorphone (DILAUDID) injection 0.5-1 mg (has no administration in time range)  ondansetron (ZOFRAN) tablet 4 mg (has no administration in time range)    Or  ondansetron (ZOFRAN) injection 4 mg (has no administration in time range)  0.9 %  sodium chloride infusion ( Intravenous New Bag/Given 03/23/23 2201)  sodium chloride 0.9 % bolus 1,000 mL (0 mLs Intravenous Stopped 03/23/23 2119)  0.9 %  sodium chloride infusion (Manually program via Guardrails IV Fluids) ( Intravenous New Bag/Given 03/23/23 1956)  fentaNYL (SUBLIMAZE) injection 50 mcg (50 mcg Intravenous Given 03/23/23 1930)  iohexol (OMNIPAQUE) 350 MG/ML injection 100 mL (100 mLs Intravenous Contrast Given 03/23/23 2015)  pantoprazole (PROTONIX) 80 mg /NS 100  mL IVPB (80 mg Intravenous New Bag/Given 03/23/23 2122)  morphine (PF) 4 MG/ML injection 4 mg (4 mg Intravenous Given 03/23/23 2119)  0.9 %  sodium chloride infusion (Manually program via Guardrails IV Fluids) ( Intravenous New Bag/Given 03/23/23 2149)    Mobility walks     Focused Assessments Cardiac Assessment Handoff:  Cardiac Rhythm: Normal sinus rhythm Lab Results  Component Value Date   CKTOTAL 374 05/16/2021   No results found for: "DDIMER" Does the Patient currently have chest pain? No   , Neuro Assessment Handoff:  Swallow screen pass?    Cardiac Rhythm: Normal sinus rhythm       Neuro Assessment: Exceptions to [facility] Neuro Checks:      Has TPA been given? No If patient is a Neuro Trauma and patient is going to OR before floor call report to 4N Charge nurse: [phone] or [phone]  , Renal Assessment Handoff:  Hemodialysis Schedule:  Last Hemodialysis date and time:   Restricted appendage:    , Pulmonary Assessment Handoff:  Lung sounds:   O2 Device: Room Air      R Recommendations: See Admitting Provider Note  Report given to:   Additional Notes: PT came in because of bleeding at ileostomy site, Post BT of unit still waiting for repeat HGB, alert x4, just changed colostomy bag. Thank you

## 2023-03-23 NOTE — ED Triage Notes (Signed)
[facility] reports pt coming from home. Pt states his ileostomy bag has been filling with blood. Pt is having abdominal pain with it. Pt removed bag. Ileostomy is about a year old.

## 2023-03-23 NOTE — ED Notes (Signed)
Emergent blood obtained from the blood bank

## 2023-03-23 NOTE — H&P (Signed)
History and Physical    Martin Anthony ZOX:[id number] DOB: 22-Sep-1965 DOA: 03/23/2023  PCP: Nelwyn Salisbury, MD   Patient coming from: Home   Chief Complaint: Blood from ileostomy, abdominal pain  HPI: Martin Anthony is a 57 y.o. male with medical history significant for alcoholism, cirrhosis, CKD stage II, and cancer of the ascending colon status post hemicolectomy who presents with abdominal pain and blood from his ileostomy.   Patient was in his usual state this afternoon when he developed severe upper abdominal pain and bleeding from his ileostomy.  He reports a long history of frequent episodes of fleeting upper abdominal discomfort but this is much more severe and persistent today.  He describes a large volume of dark blood and clot coming from his ileostomy today.  He denies vomiting, chest pain, syncope, fever, or chills.  ED Course: Upon arrival to the ED, patient is found to be afebrile and saturating well on room air with normal heart rate and stable blood pressure.  Labs are most notable for creatinine 1.75, alkaline phosphatase 180, albumin 2.4, AST 97, total bilirubin 5.4, hemoglobin 7.8, platelets 89,000, ethanol 24, and INR 1.6.  CT bleeding study is negative for active GI hemorrhage but notable for advanced cirrhosis with complications of portal venous hypertension, inflammatory stranding and wall thickening involving the second portion of the duodenum, and circumferential thickening of the proximal jejunum.  GI (Dr. Lorenso Quarry) was consulted by the ED physician and the patient was treated with 1 unit RBC, 1 L normal saline, fentanyl, morphine, and IV Protonix.  Review of Systems:  All other systems reviewed and apart from HPI, are negative.  Past Medical History:  Diagnosis Date   Anxiety    Ascites due to alcoholic cirrhosis (HCC)    Cirrhosis with alcoholism (HCC)    Depression    Diabetes mellitus without complication (HCC)    GERD (gastroesophageal reflux disease)    Gout     Hyperlipidemia    Hypertension    Primary adenocarcinoma of ascending colon (HCC)    Substance abuse (HCC)    alcoholism    Type 2 diabetes mellitus with diabetic neuropathy [facility])     Past Surgical History:  Procedure Laterality Date   BIOPSY  01/19/2019   Procedure: BIOPSY;  Surgeon: Jeani Hawking, MD;  Location: [facility];  Service: Endoscopy;;   COLON SURGERY  10/25/2021   right hemicolectomy with ostomy at [facility] in [place]   COLONOSCOPY N/A 01/19/2019   Procedure: COLONOSCOPY;  Surgeon: Jeani Hawking, MD;  Location: [facility];  Service: Endoscopy;  Laterality: N/A;   ESOPHAGOGASTRODUODENOSCOPY (EGD) WITH PROPOFOL N/A 01/19/2019   Procedure: ESOPHAGOGASTRODUODENOSCOPY (EGD) WITH PROPOFOL;  Surgeon: Jeani Hawking, MD;  Location: [facility];  Service: Endoscopy;  Laterality: N/A;   HEMOSTASIS CLIP PLACEMENT  01/19/2019   Procedure: HEMOSTASIS CLIP PLACEMENT;  Surgeon: Jeani Hawking, MD;  Location: [facility];  Service: Endoscopy;;   HERNIA REPAIR     umbilical    INGUINAL HERNIA REPAIR Bilateral    IR FLUORO GUIDE CV LINE RIGHT  05/13/2021   IR PARACENTESIS  05/19/2021   IR US GUIDE VASC ACCESS RIGHT  05/13/2021   POLYPECTOMY  01/19/2019   Procedure: POLYPECTOMY;  Surgeon: Jeani Hawking, MD;  Location: [facility];  Service: Endoscopy;;    Social History:   reports that he quit smoking about 21 years ago. His smoking use included cigarettes. He has never used smokeless tobacco. He reports current alcohol use of about 2.0 standard drinks  of alcohol per week. He reports that he does not use drugs.  Allergies  Allergen Reactions   Acetaminophen     States he was told not to take it.    Ibuprofen Other (See Comments)    Liver sensitivity    Family History  Problem Relation Age of Onset   Hypertension Mother    Diabetes Father    Heart attack Father    Pneumonia Father    Hypertension Father    Diabetes Maternal Grandfather      Prior to Admission  medications   Medication Sig Start Date End Date Taking? Authorizing Provider  carvedilol (COREG) 3.125 MG tablet Take 3.125 mg by mouth 2 (two) times daily.    [provider]  folic acid (FOLVITE) 1 MG tablet Take 1 tablet (1 mg total) by mouth daily. 05/21/21   Leroy Sea, MD  furosemide (LASIX) 40 MG tablet TAKE 1 TABLET BY MOUTH EVERY DAY 01/25/23   Nelwyn Salisbury, MD  gabapentin (NEURONTIN) 300 MG capsule Take 1 capsule (300 mg total) by mouth 3 (three) times daily. 02/02/23   Nelwyn Salisbury, MD  Iron, Ferrous Sulfate, 325 (65 Fe) MG TABS Take 325 mg by mouth daily. 12/18/21   Nelwyn Salisbury, MD  ketoconazole (NIZORAL) 2 % cream Apply 1 Application topically 2 (two) times daily. 03/12/23   Nelwyn Salisbury, MD  nadolol (CORGARD) 20 MG tablet Take 1 tablet (20 mg total) by mouth daily. 12/05/21   Nelwyn Salisbury, MD  pantoprazole (PROTONIX) 40 MG tablet Take 1 tablet (40 mg total) by mouth daily. 05/21/21   Leroy Sea, MD  rifaximin (XIFAXAN) 550 MG TABS tablet Take 1 tablet (550 mg total) by mouth 2 (two) times daily. 01/20/22   Nelwyn Salisbury, MD  sertraline (ZOLOFT) 50 MG tablet Take 1 tablet (50 mg total) by mouth daily. 02/02/23   Nelwyn Salisbury, MD  sodium bicarbonate 650 MG tablet Take 1 tablet (650 mg total) by mouth 2 (two) times daily. 05/21/21   Leroy Sea, MD  spironolactone (ALDACTONE) 25 MG tablet TAKE 1 TABLET (25 MG TOTAL) BY MOUTH DAILY AT 12 NOON. 01/25/23   Nelwyn Salisbury, MD  thiamine 100 MG tablet Take 1 tablet (100 mg total) by mouth daily. 05/21/21   Leroy Sea, MD    Physical Exam: Vitals:   03/23/23 2045 03/23/23 2100 03/23/23 2115 03/23/23 2140  BP: (!) 152/77 (!) 161/73 (!) 157/78 128/70  Pulse: (!) 59 71 73 66  Resp: 15 (!) 22 (!) 22 18  Temp:    98 F (36.7 C)  TempSrc:    Oral  SpO2: 100% 100% 100%     Constitutional: NAD, jaundiced  Eyes: PERTLA, lids and conjunctivae normal ENMT: Mucous membranes are moist. Posterior pharynx  clear of any exudate or lesions.   Neck: supple, no masses  Respiratory: no wheezing, no crackles. No accessory muscle use.  Cardiovascular: S1 & S2 heard, regular rate and rhythm. No extremity edema.   Abdomen: soft, tender in epigastrium without rebound pain. Bowel sounds active.  Musculoskeletal: no clubbing / cyanosis. No joint deformity upper and lower extremities.   Skin: no significant rashes, lesions, ulcers. Warm, dry, well-perfused. Neurologic: CN 2-12 grossly intact. Moving all extremities. Strength 5/5 in all 4 limbs. Alert and oriented.  Psychiatric: Calm. Cooperative.    Labs and Imaging on Admission: I have personally reviewed following labs and imaging studies  CBC: Recent Labs  Lab  03/23/23 1930 03/23/23 1946  WBC 4.2  --   NEUTROABS 3.4  --   HGB 7.8* 8.2*  HCT 23.8* 24.0*  MCV 108.7*  --   PLT 89*  --    Basic Metabolic Panel: Recent Labs  Lab 03/23/23 1930 03/23/23 1946  NA 136 141  K 4.4 4.8  CL 109 111  CO2 18*  --   GLUCOSE 163* 158*  BUN 22* 19  CREATININE 1.75* 2.00*  CALCIUM 8.3*  --    GFR: CrCl cannot be calculated (Unknown ideal weight.). Liver Function Tests: Recent Labs  Lab 03/23/23 1930  AST 97*  ALT 30  ALKPHOS 180*  BILITOT 5.4*  PROT 6.1*  ALBUMIN 2.4*   No results for input(s): "LIPASE", "AMYLASE" in the last 168 hours. No results for input(s): "AMMONIA" in the last 168 hours. Coagulation Profile: Recent Labs  Lab 03/23/23 1930  INR 1.6*   Cardiac Enzymes: No results for input(s): "CKTOTAL", "CKMB", "CKMBINDEX", "TROPONINI" in the last 168 hours. BNP (last 3 results) No results for input(s): "PROBNP" in the last 8760 hours. HbA1C: No results for input(s): "HGBA1C" in the last 72 hours. CBG: No results for input(s): "GLUCAP" in the last 168 hours. Lipid Profile: No results for input(s): "CHOL", "HDL", "LDLCALC", "TRIG", "CHOLHDL", "LDLDIRECT" in the last 72 hours. Thyroid Function Tests: No results for  input(s): "TSH", "T4TOTAL", "FREET4", "T3FREE", "THYROIDAB" in the last 72 hours. Anemia Panel: No results for input(s): "VITAMINB12", "FOLATE", "FERRITIN", "TIBC", "IRON", "RETICCTPCT" in the last 72 hours. Urine analysis:    Component Value Date/Time   COLORURINE YELLOW 01/20/2023 0422   APPEARANCEUR CLEAR 01/20/2023 0422   LABSPEC 1.010 01/20/2023 0422   PHURINE 5.5 01/20/2023 0422   GLUCOSEU NEGATIVE 01/20/2023 0422   HGBUR SMALL (A) 01/20/2023 0422   BILIRUBINUR NEGATIVE 01/20/2023 0422   BILIRUBINUR 2+ 05/17/2015 1013   KETONESUR NEGATIVE 01/20/2023 0422   PROTEINUR NEGATIVE 01/20/2023 0422   UROBILINOGEN 0.2 05/17/2015 1013   NITRITE NEGATIVE 01/20/2023 0422   LEUKOCYTESUR NEGATIVE 01/20/2023 0422   Sepsis Labs: @LABRCNTIP (procalcitonin:4,lacticidven:4) )No results found for this or any previous visit (from the past 240 hour(s)).   Radiological Exams on Admission: CT Angio Abd/Pel W and/or Wo Contrast  Result Date: 03/23/2023 CLINICAL DATA:  Lower gastrointestinal hemorrhage EXAM: CTA ABDOMEN AND PELVIS WITHOUT AND WITH CONTRAST TECHNIQUE: Multidetector CT imaging of the abdomen and pelvis was performed using the standard protocol during bolus administration of intravenous contrast. Multiplanar reconstructed images and MIPs were obtained and reviewed to evaluate the vascular anatomy. RADIATION DOSE REDUCTION: This exam was performed according to the departmental dose-optimization program which includes automated exposure control, adjustment of the mA and/or kV according to patient size and/or use of iterative reconstruction technique. CONTRAST:  OMNIPAQUE IOHEXOL 350 MG/ML SOLN COMPARISON:  05/12/2021 FINDINGS: VASCULAR Aorta: Normal caliber aorta without aneurysm, dissection, vasculitis or significant stenosis. Mild atherosclerotic calcification Celiac: Patent without evidence of aneurysm, dissection, vasculitis or significant stenosis. SMA: Patent without evidence of  aneurysm, dissection, vasculitis or significant stenosis. Renals: Both renal arteries are patent without evidence of aneurysm, dissection, vasculitis, fibromuscular dysplasia or significant stenosis. IMA: Patent without evidence of aneurysm, dissection, vasculitis or significant stenosis. Inflow: Patent without evidence of aneurysm, dissection, vasculitis or significant stenosis. Proximal Outflow: Bilateral common femoral and visualized portions of the superficial and profunda femoral arteries are patent without evidence of aneurysm, dissection, vasculitis or significant stenosis. Veins: Portosystemic collateralization is seen between the inferior mesenteric vein and left renal vein. The abdominal venous  vasculature is otherwise unremarkable. Review of the MIP images confirms the above findings. NON-VASCULAR Lower chest: No acute abnormality. Gastroesophageal varices noted. Moderate coronary artery calcification. Hypoattenuation of the cardiac blood pool is in keeping with at least moderate anemia. Hepatobiliary: Advanced cirrhosis. No enhancing intrahepatic mass. No intra or extrahepatic biliary ductal dilation. Gallbladder unremarkable. Pancreas: There is inflammatory stranding and wall thickening involving the second portion the duodenum asymmetrically, particularly involving the pancreatico duodenal groove. This may reflect changes of duodenal ulceration or duodenitis. The pancreas itself is unremarkable. Spleen: Moderate splenomegaly with the spleen measuring 17.8 cm in greatest dimension. No intrasplenic lesions are seen. Splenic vein is patent. Adrenals/Urinary Tract: The adrenal glands are unremarkable. The kidneys are normal in size and position. Bilateral nonobstructing renal calculi are identified measuring up to 7 mm within the lower pole the right kidney. No hydronephrosis. No ureteral calculi. The bladder is unremarkable. Stomach/Bowel: Moderate ascites. Surgical changes of proximal colectomy are  identified with a stable margin seen involving the proximal transverse colon within the right upper quadrant. End ileostomy is seen within the right mid abdomen. Circumferential mucosal thickening involving the proximal jejunum just beyond the ligament of Treitz may relate to changes of an infectious or inflammatory enteritis, malabsorption, or portal enteritis. No evidence of obstruction. No free intraperitoneal gas. No active gastrointestinal hemorrhage identified., Lymphatic: No pathologic adenopathy within the abdomen and pelvis. Reproductive: Prostate is unremarkable. Other: Small right inguinal hernia containing ascitic fluid. Musculoskeletal: No acute bone abnormality. No lytic or blastic bone lesion. IMPRESSION: 1. No active gastrointestinal hemorrhage identified. 2. Advanced cirrhosis with portal venous hypertension including splenomegaly, gastroesophageal and inferior mesenteric-renal varices, and moderate ascites. 3. Inflammatory stranding and wall thickening involving the second portion the duodenum asymmetrically, particularly involving the pancreatico duodenal groove. This may reflect changes of duodenal ulceration or duodenitis. Correlation with endoscopy may be helpful for further evaluation. 4. Circumferential mucosal thickening involving the proximal jejunum just beyond the ligament of Treitz may relate to changes of an infectious or inflammatory enteritis, malabsorption, or portal enteritis. 5. Bilateral nonobstructing renal calculi. 6. Moderate coronary artery calcification. 7. Hypoattenuation of the cardiac blood pool in keeping with at least moderate anemia. Aortic Atherosclerosis (ICD10-I70.0). Electronically Signed   By: Helyn Numbers M.D.   On: 03/23/2023 20:54   CT HEAD WO CONTRAST ( )  Result Date: 03/23/2023 CLINICAL DATA:  Head trauma EXAM: CT HEAD WITHOUT CONTRAST CT CERVICAL SPINE WITHOUT CONTRAST TECHNIQUE: Multidetector CT imaging of the head and cervical spine was performed  following the standard protocol without intravenous contrast. Multiplanar CT image reconstructions of the cervical spine were also generated. RADIATION DOSE REDUCTION: This exam was performed according to the departmental dose-optimization program which includes automated exposure control, adjustment of the mA and/or kV according to patient size and/or use of iterative reconstruction technique. COMPARISON:  CT brain 05/12/2021 FINDINGS: CT HEAD FINDINGS Brain: No acute territorial infarction, hemorrhage, or intracranial mass. Mild chronic small vessel ischemic changes of the white matter. Mild atrophy. Stable ventricle size. Vascular: No hyperdense vessels.  No unexpected calcification Skull: Normal. Negative for fracture or focal lesion. Sinuses/Orbits: No acute finding. Other: None CT CERVICAL SPINE FINDINGS Alignment: Trace anterolisthesis C7 on T1. Facet alignment is maintained Skull base and vertebrae: No acute fracture. No primary bone lesion or focal pathologic process. Soft tissues and spinal canal: No prevertebral fluid or swelling. No visible canal hematoma. Disc levels: Moderate disc space narrowing C5-C6, C6-C7 and C7-T1. Facet degenerative changes. Bilateral foraminal narrowing C5-C6 and C6-C7 Upper  chest: Negative. Other: None IMPRESSION: No CT evidence for acute intracranial abnormality. Atrophy and chronic small vessel ischemic changes of the white matter. Degenerative changes of the cervical spine. No acute osseous abnormality. Electronically Signed   By: Jasmine Pang M.D.   On: 03/23/2023 20:49   CT Cervical Spine Wo Contrast  Result Date: 03/23/2023 CLINICAL DATA:  Head trauma EXAM: CT HEAD WITHOUT CONTRAST CT CERVICAL SPINE WITHOUT CONTRAST TECHNIQUE: Multidetector CT imaging of the head and cervical spine was performed following the standard protocol without intravenous contrast. Multiplanar CT image reconstructions of the cervical spine were also generated. RADIATION DOSE REDUCTION: This  exam was performed according to the departmental dose-optimization program which includes automated exposure control, adjustment of the mA and/or kV according to patient size and/or use of iterative reconstruction technique. COMPARISON:  CT brain 05/12/2021 FINDINGS: CT HEAD FINDINGS Brain: No acute territorial infarction, hemorrhage, or intracranial mass. Mild chronic small vessel ischemic changes of the white matter. Mild atrophy. Stable ventricle size. Vascular: No hyperdense vessels.  No unexpected calcification Skull: Normal. Negative for fracture or focal lesion. Sinuses/Orbits: No acute finding. Other: None CT CERVICAL SPINE FINDINGS Alignment: Trace anterolisthesis C7 on T1. Facet alignment is maintained Skull base and vertebrae: No acute fracture. No primary bone lesion or focal pathologic process. Soft tissues and spinal canal: No prevertebral fluid or swelling. No visible canal hematoma. Disc levels: Moderate disc space narrowing C5-C6, C6-C7 and C7-T1. Facet degenerative changes. Bilateral foraminal narrowing C5-C6 and C6-C7 Upper chest: Negative. Other: None IMPRESSION: No CT evidence for acute intracranial abnormality. Atrophy and chronic small vessel ischemic changes of the white matter. Degenerative changes of the cervical spine. No acute osseous abnormality. Electronically Signed   By: Jasmine Pang M.D.   On: 03/23/2023 20:49    EKG: Independently reviewed. Sinus rhythm.   Assessment/Plan   1. Acute GI bleeding  - Hemodynamically stable with initial Hgb 7.8; no active hemorrhage noted on CTA in ED but study notable for inflammatory changes and wall-thickening involving 2nd portion of duodenum  - GI was consulted by ED MD, IV PPI was started, and 1 unit RBC transfused  - Continue bowel rest and IV PPI, follow serial CBCs and transfuse if needed    2. Cirrhosis  - MELD Na is 24 on admission (SCr acutely elevated and expected to improve)   - Hold diuretics and beta-blocker initially in  setting of acute GI bleeding, encourage strict alcohol avoidance    3. AKI superimposed on CKD 2  - SCr is 1.75, up from 1.31 in late August 2024  - No hydronephrosis on CT in ED, likely prerenal in setting of acute blood loss  - Hold diuretics, transfuse RBCs, start gentle IVF hydration, renally-dose medications, repeat chem panel in am    4. Alcohol abuse  - Monitor with CIWA, use Ativan if needed      5. Colon cancer  - Status-post hemicolectomy, under observation with Dr. Otis Peak of oncology in [place]     DVT prophylaxis: SCDs  Code Status: Full  Level of Care: Level of care: Progressive Family Communication: None present   Disposition Plan:  Patient is from: Home  Anticipated d/c is to: TBD Anticipated d/c date is: 03/27/23  Patient currently: Pending likely endoscopy, stable H&H  Consults called: GI  Admission status: Inpatient     Briscoe Deutscher, MD [facility]  03/23/2023, 9:44 PM

## 2023-03-23 NOTE — ED Provider Notes (Signed)
[place] EMERGENCY DEPARTMENT AT [facility] Provider Note   CSN: [id number] Arrival date & time: 03/23/23  1856     History  Chief Complaint  Patient presents with   Iliostomy bleeding    Martin Anthony is a 57 y.o. male.  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 He denies any SOB or chest pain. Denies drinking any alcohol today. Last drink was yesterday .  Home Medications Prior to Admission medications   Medication Sig Start Date End Date Taking? Authorizing Provider  folic acid (FOLVITE) 1 MG tablet Take 1 tablet (1 mg total) by mouth daily. 05/21/21   Leroy Sea, MD  furosemide (LASIX) 40 MG tablet TAKE 1 TABLET BY MOUTH EVERY DAY 01/25/23   Nelwyn Salisbury, MD  gabapentin (NEURONTIN) 300 MG capsule Take 1 capsule (300 mg total) by mouth 3 (three) times daily. 02/02/23   Nelwyn Salisbury, MD  Iron, Ferrous Sulfate, 325 (65 Fe) MG TABS Take 325 mg by mouth daily. 12/18/21   Nelwyn Salisbury, MD  ketoconazole (NIZORAL) 2 % cream Apply 1 Application topically 2 (two) times daily. 03/12/23   Nelwyn Salisbury, MD  nadolol (CORGARD) 20 MG tablet Take 1 tablet (20 mg total) by mouth daily. 12/05/21   Nelwyn Salisbury, MD  pantoprazole (PROTONIX) 40 MG tablet Take 1 tablet (40 mg total) by mouth daily. 05/21/21   Leroy Sea, MD  rifaximin (XIFAXAN) 550 MG TABS tablet Take 1 tablet (550 mg total) by mouth 2 (two) times daily. 01/20/22   Nelwyn Salisbury, MD  sertraline (ZOLOFT) 50 MG tablet Take 1 tablet (50 mg total) by mouth daily. 02/02/23   Nelwyn Salisbury, MD  sodium bicarbonate 650 MG tablet Take 1  tablet (650 mg total) by mouth 2 (two) times daily. 05/21/21   Leroy Sea, MD  spironolactone (ALDACTONE) 25 MG tablet TAKE 1 TABLET (25 MG TOTAL) BY MOUTH DAILY AT 12 NOON. 01/25/23   Nelwyn Salisbury, MD  thiamine 100 MG tablet Take 1 tablet (100 mg total) by mouth daily. 05/21/21   Leroy Sea, MD      Allergies    Acetaminophen and Ibuprofen    Review of Systems   Review of Systems  Physical Exam Updated Vital Signs BP (!) 150/72   Pulse 60   Temp 97.8 F (36.6 C) (Oral)   Resp 18   SpO2 100%  Physical Exam Constitutional:      General: He is in acute distress.     Appearance: He is ill-appearing and toxic-appearing. He is not diaphoretic.  Cardiovascular:     Rate and Rhythm: Tachycardia present.     Pulses: Normal pulses.  Abdominal:     General: There is no distension.     Tenderness: There is no abdominal tenderness. There is no guarding or rebound.     Comments: Ileostomy bag not present with site engorged with bright red blood  Skin:    General: Skin is warm and dry.     ED Results / Procedures / Treatments   Labs (all labs ordered are listed, but only  abnormal results are displayed) Labs Reviewed  CBC WITH DIFFERENTIAL/PLATELET - Abnormal; Notable for the following components:      Result Value   RBC 2.19 (*)    Hemoglobin 7.8 (*)    HCT 23.8 (*)    MCV 108.7 (*)    MCH 35.6 (*)    Platelets 89 (*)    Lymphs Abs 0.3 (*)    All other components within normal limits  COMPREHENSIVE METABOLIC PANEL - Abnormal; Notable for the following components:   CO2 18 (*)    Glucose, Bld 163 (*)    BUN 22 (*)    Creatinine, Ser 1.75 (*)    Calcium 8.3 (*)    Total Protein 6.1 (*)    Albumin 2.4 (*)    AST 97 (*)    Alkaline Phosphatase 180 (*)    Total Bilirubin 5.4 (*)    GFR, Estimated 45 (*)    All other components within normal limits  PROTIME-INR - Abnormal; Notable for the following components:   Prothrombin Time 18.9 (*)    INR 1.6 (*)    All  other components within normal limits  I-STAT CHEM 8, ED - Abnormal; Notable for the following components:   Creatinine, Ser 2.00 (*)    Glucose, Bld 158 (*)    TCO2 17 (*)    Hemoglobin 8.2 (*)    HCT 24.0 (*)    All other components within normal limits  ETHANOL  TYPE AND SCREEN  PREPARE RBC (CROSSMATCH)    EKG None  Radiology No results found.  Procedures Procedures    Medications Ordered in ED Medications  sodium chloride 0.9 % bolus 1,000 mL (1,000 mLs Intravenous New Bag/Given 03/23/23 1930)  0.9 %  sodium chloride infusion (Manually program via Guardrails IV Fluids) ( Intravenous New Bag/Given 03/23/23 1956)  fentaNYL (SUBLIMAZE) injection 50 mcg (50 mcg Intravenous Given 03/23/23 1930)  iohexol (OMNIPAQUE) 350 MG/ML injection 100 mL (100 mLs Intravenous Contrast Given 03/23/23 2015)    ED Course/ Medical Decision Making/ A&P                                 Medical Decision Making Amount and/or Complexity of Data Reviewed Labs: ordered. Radiology: ordered.   This patient is a 57 y.o. male who presents to the ED for concern of bleeding from ileostomy site , this involves an extensive number of treatment options, and is a complaint that carries with it a high risk of complications and morbidity. The emergent differential diagnosis prior to evaluation includes, but is not limited to, GI bleed from recurrent mass vs variceal bleed vs diverticulitis. This is not an exhaustive differential.   Past Medical History / Co-morbidities / Social History: Right-sided colon cancer diagnosed January 2023 , status post right hemicolectomy and end ileostomy (questionable Hartmann pouch) ,  Cirrhosis secondary to remote alcohol,  Esophageal variceal banding April 2023,  Encephalopathy (on Xifaxan) GI bleed  Physical Exam: Physical exam performed. The pertinent findings include: Ileostomy bag not present with site engorged with bright red blood  Lab Tests: I ordered, and  personally interpreted labs.  The pertinent results include:  CBC showed an Hgb of  7.8 from 9.5 a month ago.   Imaging Studies: I ordered imaging studies including CT cervical Spine/ Head wo Contrast/ CTA abdomen and pelvis. I independently visualized and interpreted imaging.    Medications: I ordered medication including Fentanyl  for  pain . Reevaluation of the patient after these medicines showed that the patient improved. I have reviewed the patients home medicines and have made adjustments as needed.  Disposition: After consideration of the diagnostic results and the patients response to treatment, I feel that emergency department workup  suggest an emergent condition requiring admission or immediate intervention beyond what has been performed at this time. The plan is consult the hospitalist for admission.  I discussed this case with my attending physician Dr. Silverio Lay  who cosigned this note including patient's presenting symptoms, physical exam, and planned diagnostics and interventions. Attending physician stated agreement with plan or made changes to plan which were implemented.    NB: I signed out to Dr Silverio Lay .         Final Clinical Impression(s) / ED Diagnoses Final diagnoses:  None    Rx / DC Orders ED Discharge Orders     None         Kathleen Lime, MD 03/23/23 2045

## 2023-03-23 NOTE — ED Notes (Signed)
Pt requesting pain medication. Dr. Silverio Lay notified

## 2023-03-24 DIAGNOSIS — K922 Gastrointestinal hemorrhage, unspecified: Secondary | ICD-10-CM | POA: Diagnosis not present

## 2023-03-24 LAB — CBC
HCT: 23.1 % — ABNORMAL LOW (ref 39.0–52.0)
HCT: 20.6 % — ABNORMAL LOW (ref 39.0–52.0)
HCT: 22.1 % — ABNORMAL LOW (ref 39.0–52.0)
HCT: 26.5 % — ABNORMAL LOW (ref 39.0–52.0)
Hemoglobin: 7.5 g/dL — ABNORMAL LOW (ref 13.0–17.0)
Hemoglobin: 6.6 g/dL — CL (ref 13.0–17.0)
Hemoglobin: 7.3 g/dL — ABNORMAL LOW (ref 13.0–17.0)
Hemoglobin: 8.4 g/dL — ABNORMAL LOW (ref 13.0–17.0)
MCH: 35 pg — ABNORMAL HIGH (ref 26.0–34.0)
MCH: 34.2 pg — ABNORMAL HIGH (ref 26.0–34.0)
MCH: 35.4 pg — ABNORMAL HIGH (ref 26.0–34.0)
MCH: 34.6 pg — ABNORMAL HIGH (ref 26.0–34.0)
MCHC: 32.5 g/dL (ref 30.0–36.0)
MCHC: 32 g/dL (ref 30.0–36.0)
MCHC: 33 g/dL (ref 30.0–36.0)
MCHC: 31.7 g/dL (ref 30.0–36.0)
MCV: 107.9 fL — ABNORMAL HIGH (ref 80.0–100.0)
MCV: 106.7 fL — ABNORMAL HIGH (ref 80.0–100.0)
MCV: 107.3 fL — ABNORMAL HIGH (ref 80.0–100.0)
MCV: 109.1 fL — ABNORMAL HIGH (ref 80.0–100.0)
Platelets: 69 10*3/uL — ABNORMAL LOW (ref 150–400)
Platelets: 62 10*3/uL — ABNORMAL LOW (ref 150–400)
Platelets: 77 10*3/uL — ABNORMAL LOW (ref 150–400)
Platelets: 80 10*3/uL — ABNORMAL LOW (ref 150–400)
RBC: 2.14 MIL/uL — ABNORMAL LOW (ref 4.22–5.81)
RBC: 1.93 MIL/uL — ABNORMAL LOW (ref 4.22–5.81)
RBC: 2.06 MIL/uL — ABNORMAL LOW (ref 4.22–5.81)
RBC: 2.43 MIL/uL — ABNORMAL LOW (ref 4.22–5.81)
RDW: 16.5 % — ABNORMAL HIGH (ref 11.5–15.5)
RDW: 16.7 % — ABNORMAL HIGH (ref 11.5–15.5)
RDW: 17.1 % — ABNORMAL HIGH (ref 11.5–15.5)
RDW: 16.7 % — ABNORMAL HIGH (ref 11.5–15.5)
WBC: 3.3 10*3/uL — ABNORMAL LOW (ref 4.0–10.5)
WBC: 2.4 10*3/uL — ABNORMAL LOW (ref 4.0–10.5)
WBC: 3.2 10*3/uL — ABNORMAL LOW (ref 4.0–10.5)
WBC: 3.7 10*3/uL — ABNORMAL LOW (ref 4.0–10.5)
nRBC: 0 % (ref 0.0–0.2)
nRBC: 0 % (ref 0.0–0.2)
nRBC: 0 % (ref 0.0–0.2)
nRBC: 0 % (ref 0.0–0.2)

## 2023-03-24 LAB — COMPREHENSIVE METABOLIC PANEL
ALT: 27 U/L (ref 0–44)
AST: 83 U/L — ABNORMAL HIGH (ref 15–41)
Albumin: 2.3 g/dL — ABNORMAL LOW (ref 3.5–5.0)
Alkaline Phosphatase: 146 U/L — ABNORMAL HIGH (ref 38–126)
Anion gap: 6 (ref 5–15)
BUN: 23 mg/dL — ABNORMAL HIGH (ref 6–20)
CO2: 20 mmol/L — ABNORMAL LOW (ref 22–32)
Calcium: 7.7 mg/dL — ABNORMAL LOW (ref 8.9–10.3)
Chloride: 111 mmol/L (ref 98–111)
Creatinine, Ser: 1.67 mg/dL — ABNORMAL HIGH (ref 0.61–1.24)
GFR, Estimated: 48 mL/min — ABNORMAL LOW (ref >60)
Glucose, Bld: 152 mg/dL — ABNORMAL HIGH (ref 70–99)
Potassium: 4.3 mmol/L (ref 3.5–5.1)
Sodium: 137 mmol/L (ref 135–145)
Total Bilirubin: 5.7 mg/dL — ABNORMAL HIGH (ref 0.3–1.2)
Total Protein: 5.6 g/dL — ABNORMAL LOW (ref 6.5–8.1)

## 2023-03-24 LAB — HEMOGLOBIN AND HEMATOCRIT, BLOOD
HCT: 21 % — ABNORMAL LOW (ref 39.0–52.0)
Hemoglobin: 6.8 g/dL — CL (ref 13.0–17.0)

## 2023-03-24 LAB — PREPARE RBC (CROSSMATCH)

## 2023-03-24 LAB — MAGNESIUM: Magnesium: 1.8 mg/dL (ref 1.7–2.4)

## 2023-03-24 LAB — PHOSPHORUS: Phosphorus: 3.1 mg/dL (ref 2.5–4.6)

## 2023-03-24 LAB — HIV ANTIBODY (ROUTINE TESTING W REFLEX): HIV Screen 4th Generation wRfx: NONREACTIVE

## 2023-03-24 MED ORDER — SODIUM CHLORIDE 0.9% IV SOLUTION: Freq: Once | INTRAVENOUS | Status: AC

## 2023-03-24 MED ORDER — RIFAXIMIN 550 MG PO TABS
550.0000 mg | ORAL_TABLET | Freq: Two times a day (BID) | ORAL | Status: DC
  Administered 2023-03-24 – 2023-04-08 (×26): 550 mg via ORAL
  Filled 2023-03-24 (×28): qty 1

## 2023-03-24 MED ORDER — SODIUM CHLORIDE 0.9 % IV SOLN: INTRAVENOUS | Status: DC

## 2023-03-24 MED ORDER — OCTREOTIDE LOAD VIA INFUSION
50.0000 ug | Freq: Once | INTRAVENOUS | Status: AC
  Administered 2023-03-24: 50 ug via INTRAVENOUS
  Filled 2023-03-24: qty 25

## 2023-03-24 MED ORDER — SODIUM CHLORIDE 0.9 % IV SOLN
25.0000 ug/h | INTRAVENOUS | Status: AC
  Administered 2023-03-24 – 2023-03-28 (×9): 50 ug/h via INTRAVENOUS
  Administered 2023-03-29: 25 ug/h via INTRAVENOUS
  Administered 2023-03-29: 50 ug/h via INTRAVENOUS
  Filled 2023-03-24 (×16): qty 1

## 2023-03-24 NOTE — Progress Notes (Signed)
PROGRESS NOTE    Martin Anthony  QIO:[id number] DOB: Feb 23, 1966 DOA: 03/23/2023 PCP: Nelwyn Salisbury, MD   Brief Narrative: Martin Anthony is a 57 y.o. male with a history of alcoholism, cirrhosis, CKD stage II, portal hypertension, esophageal varices, colon cancer status post hemicolectomy.  Patient presented secondary to blood from ileostomy associated abdominal pain concerning for GI bleeding.  Patient found to have associated acute anemia requiring blood transfusion on admission.  [facility] gastroenterology was consulted for management.   Assessment and Plan:  Acute GI bleeding CTA negative for identification of source but was notable for inflammatory changes and wall-thickening  involving the second portion of the duodenum. Patient started empirically on Protonix IV. GI consulted. Patient has a history of esophageal varices and portal hypertension. -GI recommendations: IV Protonix, Octreotide, clear liquid diet, EGD in AM  Acute on chronic anemia Baseline hemoglobin of 9-11. Hemoglobin of 7.8 on admission. Patient has required 1 unit of PRBC to date. Hemoglobin down to 6.6 > 6.8 this morning. -Transfuse 1 unit PRBC  Cirrhosis MELD Na of 24 on admission. Lasix and spironolactone held secondary to acute GI bleeding.  Esophageal varies Portal hypertension -Resume home nadolol if blood pressure remains stable.  AKI on CKD 2 Baseline creatinine appears to be around 1.3. Creatinine of 1.75 with peak to 2. Creatinine down to 1.67 today.  Alcohol abuse Continue CIWA.  Colon cancer S/p hemicolectomy. Under observation.  DVT prophylaxis: SCDs Code Status:   Code Status: Full Code Family Communication: None at bedside Disposition Plan: Discharge home likely in 1-2 days pending stable hemoglobin, GI recommendations   Consultants:  [facility]  Procedures:  None  Antimicrobials: None    Subjective: No bleeding noted since admission.  Objective: BP 127/76 (BP  Location: Right Arm)   Pulse 73   Temp 98 F (36.7 C) (Oral)   Resp 19   Ht 6\' 4"  (1.93 m)   Wt 86.3 kg   SpO2 100%   BMI 23.16 kg/m   Examination:  General exam: Appears calm and comfortable Respiratory system: Clear to auscultation. Respiratory effort normal. Cardiovascular system: S1 & S2 heard, RRR. Gastrointestinal system: Abdomen is nondistended, soft and nontender. Normal bowel sounds heard. Brownish/green stool in ostomy bag. Central nervous system: Alert and oriented. No focal neurological deficits. Psychiatry: Judgement and insight appear normal. Mood & affect appropriate.    Data Reviewed: I have personally reviewed following labs and imaging studies  CBC Lab Results  Component Value Date   WBC 2.4 (L) 03/24/2023   RBC 1.93 (L) 03/24/2023   HGB 6.8 (LL) 03/24/2023   HCT 21.0 (L) 03/24/2023   MCV 106.7 (H) 03/24/2023   MCH 34.2 (H) 03/24/2023   PLT 62 (L) 03/24/2023   MCHC 32.0 03/24/2023   RDW 16.7 (H) 03/24/2023   LYMPHSABS 0.3 (L) 03/23/2023   MONOABS 0.4 03/23/2023   EOSABS 0.0 03/23/2023   BASOSABS 0.0 03/23/2023     Last metabolic panel Lab Results  Component Value Date   NA 137 03/24/2023   K 4.3 03/24/2023   CL 111 03/24/2023   CO2 20 (L) 03/24/2023   BUN 23 (H) 03/24/2023   CREATININE 1.67 (H) 03/24/2023   GLUCOSE 152 (H) 03/24/2023   GFRNONAA 48 (L) 03/24/2023   GFRAA >60 01/22/2019   CALCIUM 7.7 (L) 03/24/2023   PHOS 3.1 03/24/2023   PROT 5.6 (L) 03/24/2023   ALBUMIN 2.3 (L) 03/24/2023   BILITOT 5.7 (H) 03/24/2023   ALKPHOS 146 (H) 03/24/2023  AST 83 (H) 03/24/2023   ALT 27 03/24/2023   ANIONGAP 6 03/24/2023    GFR: Estimated Creatinine Clearance: 61 mL/min (A) (by C-G formula based on SCr of 1.67 mg/dL (H)).  No results found for this or any previous visit (from the past 240 hour(s)).    Radiology Studies: CT Angio Abd/Pel W and/or Wo Contrast  Result Date: 03/23/2023 CLINICAL DATA:  Lower gastrointestinal hemorrhage  EXAM: CTA ABDOMEN AND PELVIS WITHOUT AND WITH CONTRAST TECHNIQUE: Multidetector CT imaging of the abdomen and pelvis was performed using the standard protocol during bolus administration of intravenous contrast. Multiplanar reconstructed images and MIPs were obtained and reviewed to evaluate the vascular anatomy. RADIATION DOSE REDUCTION: This exam was performed according to the departmental dose-optimization program which includes automated exposure control, adjustment of the mA and/or kV according to patient size and/or use of iterative reconstruction technique. CONTRAST:  OMNIPAQUE IOHEXOL 350 MG/ML SOLN COMPARISON:  05/12/2021 FINDINGS: VASCULAR Aorta: Normal caliber aorta without aneurysm, dissection, vasculitis or significant stenosis. Mild atherosclerotic calcification Celiac: Patent without evidence of aneurysm, dissection, vasculitis or significant stenosis. SMA: Patent without evidence of aneurysm, dissection, vasculitis or significant stenosis. Renals: Both renal arteries are patent without evidence of aneurysm, dissection, vasculitis, fibromuscular dysplasia or significant stenosis. IMA: Patent without evidence of aneurysm, dissection, vasculitis or significant stenosis. Inflow: Patent without evidence of aneurysm, dissection, vasculitis or significant stenosis. Proximal Outflow: Bilateral common femoral and visualized portions of the superficial and profunda femoral arteries are patent without evidence of aneurysm, dissection, vasculitis or significant stenosis. Veins: Portosystemic collateralization is seen between the inferior mesenteric vein and left renal vein. The abdominal venous vasculature is otherwise unremarkable. Review of the MIP images confirms the above findings. NON-VASCULAR Lower chest: No acute abnormality. Gastroesophageal varices noted. Moderate coronary artery calcification. Hypoattenuation of the cardiac blood pool is in keeping with at least moderate anemia. Hepatobiliary:  Advanced cirrhosis. No enhancing intrahepatic mass. No intra or extrahepatic biliary ductal dilation. Gallbladder unremarkable. Pancreas: There is inflammatory stranding and wall thickening involving the second portion the duodenum asymmetrically, particularly involving the pancreatico duodenal groove. This may reflect changes of duodenal ulceration or duodenitis. The pancreas itself is unremarkable. Spleen: Moderate splenomegaly with the spleen measuring 17.8 cm in greatest dimension. No intrasplenic lesions are seen. Splenic vein is patent. Adrenals/Urinary Tract: The adrenal glands are unremarkable. The kidneys are normal in size and position. Bilateral nonobstructing renal calculi are identified measuring up to 7 mm within the lower pole the right kidney. No hydronephrosis. No ureteral calculi. The bladder is unremarkable. Stomach/Bowel: Moderate ascites. Surgical changes of proximal colectomy are identified with a stable margin seen involving the proximal transverse colon within the right upper quadrant. End ileostomy is seen within the right mid abdomen. Circumferential mucosal thickening involving the proximal jejunum just beyond the ligament of Treitz may relate to changes of an infectious or inflammatory enteritis, malabsorption, or portal enteritis. No evidence of obstruction. No free intraperitoneal gas. No active gastrointestinal hemorrhage identified., Lymphatic: No pathologic adenopathy within the abdomen and pelvis. Reproductive: Prostate is unremarkable. Other: Small right inguinal hernia containing ascitic fluid. Musculoskeletal: No acute bone abnormality. No lytic or blastic bone lesion. IMPRESSION: 1. No active gastrointestinal hemorrhage identified. 2. Advanced cirrhosis with portal venous hypertension including splenomegaly, gastroesophageal and inferior mesenteric-renal varices, and moderate ascites. 3. Inflammatory stranding and wall thickening involving the second portion the duodenum  asymmetrically, particularly involving the pancreatico duodenal groove. This may reflect changes of duodenal ulceration or duodenitis. Correlation with endoscopy may be  helpful for further evaluation. 4. Circumferential mucosal thickening involving the proximal jejunum just beyond the ligament of Treitz may relate to changes of an infectious or inflammatory enteritis, malabsorption, or portal enteritis. 5. Bilateral nonobstructing renal calculi. 6. Moderate coronary artery calcification. 7. Hypoattenuation of the cardiac blood pool in keeping with at least moderate anemia. Aortic Atherosclerosis (ICD10-I70.0). Electronically Signed   By: Helyn Numbers M.D.   On: 03/23/2023 20:54   CT HEAD WO CONTRAST ( )  Result Date: 03/23/2023 CLINICAL DATA:  Head trauma EXAM: CT HEAD WITHOUT CONTRAST CT CERVICAL SPINE WITHOUT CONTRAST TECHNIQUE: Multidetector CT imaging of the head and cervical spine was performed following the standard protocol without intravenous contrast. Multiplanar CT image reconstructions of the cervical spine were also generated. RADIATION DOSE REDUCTION: This exam was performed according to the departmental dose-optimization program which includes automated exposure control, adjustment of the mA and/or kV according to patient size and/or use of iterative reconstruction technique. COMPARISON:  CT brain 05/12/2021 FINDINGS: CT HEAD FINDINGS Brain: No acute territorial infarction, hemorrhage, or intracranial mass. Mild chronic small vessel ischemic changes of the white matter. Mild atrophy. Stable ventricle size. Vascular: No hyperdense vessels.  No unexpected calcification Skull: Normal. Negative for fracture or focal lesion. Sinuses/Orbits: No acute finding. Other: None CT CERVICAL SPINE FINDINGS Alignment: Trace anterolisthesis C7 on T1. Facet alignment is maintained Skull base and vertebrae: No acute fracture. No primary bone lesion or focal pathologic process. Soft tissues and spinal canal: No  prevertebral fluid or swelling. No visible canal hematoma. Disc levels: Moderate disc space narrowing C5-C6, C6-C7 and C7-T1. Facet degenerative changes. Bilateral foraminal narrowing C5-C6 and C6-C7 Upper chest: Negative. Other: None IMPRESSION: No CT evidence for acute intracranial abnormality. Atrophy and chronic small vessel ischemic changes of the white matter. Degenerative changes of the cervical spine. No acute osseous abnormality. Electronically Signed   By: Jasmine Pang M.D.   On: 03/23/2023 20:49   CT Cervical Spine Wo Contrast  Result Date: 03/23/2023 CLINICAL DATA:  Head trauma EXAM: CT HEAD WITHOUT CONTRAST CT CERVICAL SPINE WITHOUT CONTRAST TECHNIQUE: Multidetector CT imaging of the head and cervical spine was performed following the standard protocol without intravenous contrast. Multiplanar CT image reconstructions of the cervical spine were also generated. RADIATION DOSE REDUCTION: This exam was performed according to the departmental dose-optimization program which includes automated exposure control, adjustment of the mA and/or kV according to patient size and/or use of iterative reconstruction technique. COMPARISON:  CT brain 05/12/2021 FINDINGS: CT HEAD FINDINGS Brain: No acute territorial infarction, hemorrhage, or intracranial mass. Mild chronic small vessel ischemic changes of the white matter. Mild atrophy. Stable ventricle size. Vascular: No hyperdense vessels.  No unexpected calcification Skull: Normal. Negative for fracture or focal lesion. Sinuses/Orbits: No acute finding. Other: None CT CERVICAL SPINE FINDINGS Alignment: Trace anterolisthesis C7 on T1. Facet alignment is maintained Skull base and vertebrae: No acute fracture. No primary bone lesion or focal pathologic process. Soft tissues and spinal canal: No prevertebral fluid or swelling. No visible canal hematoma. Disc levels: Moderate disc space narrowing C5-C6, C6-C7 and C7-T1. Facet degenerative changes. Bilateral foraminal  narrowing C5-C6 and C6-C7 Upper chest: Negative. Other: None IMPRESSION: No CT evidence for acute intracranial abnormality. Atrophy and chronic small vessel ischemic changes of the white matter. Degenerative changes of the cervical spine. No acute osseous abnormality. Electronically Signed   By: Jasmine Pang M.D.   On: 03/23/2023 20:49      LOS: 1 day    Jacquelin Hawking, MD [facility] 03/24/2023, 11:23 AM   If 7PM-7AM, please contact night-coverage [email or web address]

## 2023-03-24 NOTE — Plan of Care (Signed)

## 2023-03-24 NOTE — H&P (View-Only) (Signed)
[facility] Gastroenterology Consult  Referring Provider: ER Primary Care Physician:  Nelwyn Salisbury, MD Primary Gastroenterologist: Gentry Fitz  Reason for Consultation: Blood in ileostomy bag  HPI: Martin Anthony is a 57 y.o. male with history of ascending colon cancer, status post hemicolectomy with ileostomy, significant alcohol use and cirrhosis came to the ER when he noticed large amount of dark and bloody output in his ostomy bag. Patient states he normally empties it every time he goes to the bathroom to urinate and has not seen blood in ostomy bag has yesterday. Patient states for his colon cancer he gets treatment at [facility] where he had surgery, was told it was early stage and did not require any chemotherapy or radiation. He complains of mild nausea but denies vomiting. He has occasional passage of mucus per rectum. He denies episodes of hematemesis or coffee-ground emesis.  He denies use of aspirin, NSAIDs or blood thinners/antiplatelets, Goody powders. Denies acid reflux, heartburn, difficulty swallowing, pain on swallowing, unintentional weight loss, loss of appetite. Complains of occasional abdominal cramping and pain. Patient reports being diagnosed with alcohol-related cirrhosis many years ago but remains unclear regarding compliance with diuretics, beta-blockers.  He states he cannot tolerate lactulose but takes Xifaxan on a regular basis for history of hepatic encephalopathy.  Prior GI workup: EGD, Dr. Jeani Hawking 01/19/2019 for abnormal CAT scan: Esophageal varices, portal hypertension, repeat recommended in 3 years Colonoscopy, Dr. Luisa Hart on 01/19/2019: 7 cm ascending colon mass, tubular adenoma with high-grade dysplasia, not amenable for endoscopic resection, 7 mm rectosigmoid tubular adenoma removed. Right hemicolectomy performed in [place] on 10/25/2021.   Past Medical History:  Diagnosis Date   Anxiety    Ascites due to alcoholic cirrhosis (HCC)    Cirrhosis with  alcoholism (HCC)    Depression    Diabetes mellitus without complication (HCC)    GERD (gastroesophageal reflux disease)    Gout    Hyperlipidemia    Hypertension    Primary adenocarcinoma of ascending colon (HCC)    Substance abuse (HCC)    alcoholism    Type 2 diabetes mellitus with diabetic neuropathy [facility])     Past Surgical History:  Procedure Laterality Date   BIOPSY  01/19/2019   Procedure: BIOPSY;  Surgeon: Jeani Hawking, MD;  Location: [facility] ENDOSCOPY;  Service: Endoscopy;;   COLON SURGERY  10/25/2021   right hemicolectomy with ostomy at [facility] in [place]   COLONOSCOPY N/A 01/19/2019   Procedure: COLONOSCOPY;  Surgeon: Jeani Hawking, MD;  Location: [facility] ENDOSCOPY;  Service: Endoscopy;  Laterality: N/A;   ESOPHAGOGASTRODUODENOSCOPY (EGD) WITH PROPOFOL N/A 01/19/2019   Procedure: ESOPHAGOGASTRODUODENOSCOPY (EGD) WITH PROPOFOL;  Surgeon: Jeani Hawking, MD;  Location: [facility] ENDOSCOPY;  Service: Endoscopy;  Laterality: N/A;   HEMOSTASIS CLIP PLACEMENT  01/19/2019   Procedure: HEMOSTASIS CLIP PLACEMENT;  Surgeon: Jeani Hawking, MD;  Location: [facility] ENDOSCOPY;  Service: Endoscopy;;   HERNIA REPAIR     umbilical    INGUINAL HERNIA REPAIR Bilateral    IR FLUORO GUIDE CV LINE RIGHT  05/13/2021   IR PARACENTESIS  05/19/2021   IR US GUIDE VASC ACCESS RIGHT  05/13/2021   POLYPECTOMY  01/19/2019   Procedure: POLYPECTOMY;  Surgeon: Jeani Hawking, MD;  Location: [facility] ENDOSCOPY;  Service: Endoscopy;;    Prior to Admission medications   Medication Sig Start Date End Date Taking? Authorizing Provider  folic acid (FOLVITE) 1 MG tablet Take 1 tablet (1 mg total) by mouth daily. 05/21/21  Yes Leroy Sea, MD  furosemide (LASIX) 40  MG tablet TAKE 1 TABLET BY MOUTH EVERY DAY 01/25/23  Yes Nelwyn Salisbury, MD  gabapentin (NEURONTIN) 300 MG capsule Take 1 capsule (300 mg total) by mouth 3 (three) times daily. 02/02/23  Yes Nelwyn Salisbury, MD  Iron, Ferrous Sulfate, 325 (65 Fe) MG TABS Take 325 mg by  mouth daily. 12/18/21  Yes Nelwyn Salisbury, MD  MAGNESIUM PO Take 1 tablet by mouth daily.   Yes [provider]  nadolol (CORGARD) 20 MG tablet Take 1 tablet (20 mg total) by mouth daily. 12/05/21  Yes Nelwyn Salisbury, MD  pantoprazole (PROTONIX) 40 MG tablet Take 1 tablet (40 mg total) by mouth daily. 05/21/21  Yes Leroy Sea, MD  rifaximin (XIFAXAN) 550 MG TABS tablet Take 1 tablet (550 mg total) by mouth 2 (two) times daily. 01/20/22  Yes Nelwyn Salisbury, MD  sertraline (ZOLOFT) 50 MG tablet Take 1 tablet (50 mg total) by mouth daily. 02/02/23  Yes Nelwyn Salisbury, MD  spironolactone (ALDACTONE) 25 MG tablet TAKE 1 TABLET (25 MG TOTAL) BY MOUTH DAILY AT 12 NOON. 01/25/23  Yes Nelwyn Salisbury, MD  thiamine 100 MG tablet Take 1 tablet (100 mg total) by mouth daily. 05/21/21  Yes Leroy Sea, MD  carvedilol (COREG) 3.125 MG tablet Take 3.125 mg by mouth 2 (two) times daily. Patient not taking: Reported on 03/24/2023    [provider]  ketoconazole (NIZORAL) 2 % cream Apply 1 Application topically 2 (two) times daily. Patient not taking: Reported on 03/24/2023 03/12/23   Nelwyn Salisbury, MD  sodium bicarbonate 650 MG tablet Take 1 tablet (650 mg total) by mouth 2 (two) times daily. Patient not taking: Reported on 03/24/2023 05/21/21   Leroy Sea, MD    Current Facility-Administered Medications  Medication Dose Route Frequency Provider Last Rate Last Admin   0.9 %  sodium chloride infusion   Intravenous Continuous Kerin Salen, MD       HYDROmorphone (DILAUDID) injection 0.5-1 mg  0.5-1 mg Intravenous Q4H PRN Opyd, Lavone Neri, MD   1 mg at 03/24/23 1412   LORazepam (ATIVAN) injection 0-4 mg  0-4 mg Intravenous Q6H Opyd, Lavone Neri, MD   4 mg at 03/24/23 0255   Followed by   Melene Muller ON 03/25/2023] LORazepam (ATIVAN) injection 0-4 mg  0-4 mg Intravenous Q12H Opyd, Lavone Neri, MD       LORazepam (ATIVAN) tablet 1-4 mg  1-4 mg Oral Q1H PRN Opyd, Lavone Neri, MD       Or   LORazepam  (ATIVAN) injection 1-4 mg  1-4 mg Intravenous Q1H PRN Opyd, Lavone Neri, MD       octreotide (SANDOSTATIN) 2 mcg/mL load via infusion 50 mcg  50 mcg Intravenous Once Kerin Salen, MD       And   octreotide (SANDOSTATIN) 500 mcg in sodium chloride 0.9 % 250 mL (2 mcg/mL) infusion  50 mcg/hr Intravenous Continuous Kerin Salen, MD       ondansetron [facility]) tablet 4 mg  4 mg Oral Q6H PRN Opyd, Lavone Neri, MD       Or   ondansetron (ZOFRAN) injection 4 mg  4 mg Intravenous Q6H PRN Opyd, Lavone Neri, MD       pantoprozole (PROTONIX) 80 mg /NS 100 mL infusion  8 mg/hr Intravenous Continuous Opyd, Lavone Neri, MD 10 mL/hr at 03/24/23 0837 8 mg/hr at 03/24/23 0837   rifaximin (XIFAXAN) tablet 550 mg  550 mg Oral BID Kerin Salen, MD  sodium chloride flush (NS) 0.9 % injection 3 mL  3 mL Intravenous Q12H Opyd, Lavone Neri, MD   3 mL at 03/24/23 0837   thiamine (VITAMIN B1) injection 100 mg  100 mg Intravenous Daily Opyd, Lavone Neri, MD   100 mg at 03/24/23 0840    Allergies as of 03/23/2023 - Review Complete 03/23/2023  Allergen Reaction Noted   Acetaminophen  01/20/2023   Ibuprofen Other (See Comments) 01/20/2023    Family History  Problem Relation Age of Onset   Hypertension Mother    Diabetes Father    Heart attack Father    Pneumonia Father    Hypertension Father    Diabetes Maternal Grandfather     Social History   Socioeconomic History   Marital status: Divorced    Spouse name: Not on file   Number of children: Not on file   Years of education: Not on file   Highest education level: Bachelor's degree (e.g., BA, AB, BS)  Occupational History   Not on file  Tobacco Use   Smoking status: Former    Current packs/day: 0.00    Types: Cigarettes    Quit date: 08/11/2001    Years since quitting: 21.6   Smokeless tobacco: Never  Substance and Sexual Activity   Alcohol use: Yes    Alcohol/week: 2.0 standard drinks of alcohol    Types: 2 Standard drinks or equivalent per week   Drug use:  No   Sexual activity: Not on file  Other Topics Concern   Not on file  Social History Narrative   Not on file   Social Determinants of Health   Financial Resource Strain: Low Risk  (10/06/2022)   [facility]   Overall Financial Resource Strain (CARDIA)    Difficulty of Paying Living Expenses: Not hard at all  Food Insecurity: Food Insecurity Present (03/23/2023)   Hunger Vital Sign    Worried About Running Out of Food in the Last Year: Sometimes true    Ran Out of Food in the Last Year: Sometimes true  Transportation Needs: No Transportation Needs (03/23/2023)   PRAPARE - Administrator, Civil Service (Medical): No    Lack of Transportation (Non-Medical): No  Recent Concern: Transportation Needs - Unmet Transportation Needs (03/23/2023)   PRAPARE - Administrator, Civil Service (Medical): Yes    Lack of Transportation (Non-Medical): Not on file  Physical Activity: Unknown (12/05/2021)   Exercise Vital Sign    Days of Exercise per Week: 0 days    Minutes of Exercise per Session: Not on file  Stress: Stress Concern Present (12/05/2021)   [employer] of Occupational Health - Occupational Stress Questionnaire    Feeling of Stress : Very much  Social Connections: Unknown (08/11/2022)   [facility] from [facility], [facility]   Social Network    Social Network: Not on file  Intimate Partner Violence: Not At Risk (03/23/2023)   Humiliation, Afraid, Rape, and Kick questionnaire    Fear of Current or Ex-Partner: No    Emotionally Abused: No    Physically Abused: No    Sexually Abused: No    Review of Systems: As per HPI   Physical Exam: Vital signs in last 24 hours: Temp:  [97.8 F (36.6 C)-99.4 F (37.4 C)] 97.9 F (36.6 C) (10/09 1330) Pulse Rate:  [56-81] 78 (10/09 1330) Resp:  [13-22] 17 (10/09 1330) BP: (  125-161)/(70-100) 125/72 (10/09 1330) SpO2:  [100 %]  100 % (10/09 1330) Weight:  [86.3 kg] 86.3 kg (10/09 0456)    General:   Alert,  Well-developed, well-nourished, pleasant and cooperative in NAD Head:  Normocephalic and atraumatic. Eyes:  Icterus.   Prominent pallor  Ears:  Normal auditory acuity. Nose:  No deformity, discharge,  or lesions. Mouth:  No deformity or lesions.  Oropharynx pink & moist. Neck:  Supple; no masses or thyromegaly. Lungs:  Clear throughout to auscultation.   No wheezes, crackles, or rhonchi. No acute distress. Heart:  Regular rate and rhythm; no murmurs, clicks, rubs,  or gallops. Extremities:  Without clubbing or edema. Neurologic:  Alert and  oriented x4;  grossly normal neurologically. Skin:  Intact without significant lesions or rashes. Psych:  Alert and cooperative. Normal mood and affect. Abdomen: Ileostomy noted on right side of the abdomen, liquid yellow stool noted        Lab Results: Recent Labs    03/24/23 0055 03/24/23 0634 03/24/23 0929 03/24/23 1202  WBC 3.3* 2.4*  --  3.2*  HGB 7.5* 6.6* 6.8* 7.3*  HCT 23.1* 20.6* 21.0* 22.1*  PLT 69* 62*  --  77*   BMET Recent Labs    03/23/23 1930 03/23/23 1946 03/24/23 0055  NA 136 141 137  K 4.4 4.8 4.3  CL 109 111 111  CO2 18*  --  20*  GLUCOSE 163* 158* 152*  BUN 22* 19 23*  CREATININE 1.75* 2.00* 1.67*  CALCIUM 8.3*  --  7.7*   LFT Recent Labs    03/24/23 0055  PROT 5.6*  ALBUMIN 2.3*  AST 83*  ALT 27  ALKPHOS 146*  BILITOT 5.7*   PT/INR Recent Labs    03/23/23 1930  LABPROT 18.9*  INR 1.6*    Studies/Results: CT Angio Abd/Pel W and/or Wo Contrast  Result Date: 03/23/2023 CLINICAL DATA:  Lower gastrointestinal hemorrhage EXAM: CTA ABDOMEN AND PELVIS WITHOUT AND WITH CONTRAST TECHNIQUE: Multidetector CT imaging of the abdomen and pelvis was performed using the standard protocol during bolus administration of intravenous contrast. Multiplanar reconstructed images and MIPs were obtained and reviewed to evaluate the  vascular anatomy. RADIATION DOSE REDUCTION: This exam was performed according to the departmental dose-optimization program which includes automated exposure control, adjustment of the mA and/or kV according to patient size and/or use of iterative reconstruction technique. CONTRAST:  OMNIPAQUE IOHEXOL 350 MG/ML SOLN COMPARISON:  05/12/2021 FINDINGS: VASCULAR Aorta: Normal caliber aorta without aneurysm, dissection, vasculitis or significant stenosis. Mild atherosclerotic calcification Celiac: Patent without evidence of aneurysm, dissection, vasculitis or significant stenosis. SMA: Patent without evidence of aneurysm, dissection, vasculitis or significant stenosis. Renals: Both renal arteries are patent without evidence of aneurysm, dissection, vasculitis, fibromuscular dysplasia or significant stenosis. IMA: Patent without evidence of aneurysm, dissection, vasculitis or significant stenosis. Inflow: Patent without evidence of aneurysm, dissection, vasculitis or significant stenosis. Proximal Outflow: Bilateral common femoral and visualized portions of the superficial and profunda femoral arteries are patent without evidence of aneurysm, dissection, vasculitis or significant stenosis. Veins: Portosystemic collateralization is seen between the inferior mesenteric vein and left renal vein. The abdominal venous vasculature is otherwise unremarkable. Review of the MIP images confirms the above findings. NON-VASCULAR Lower chest: No acute abnormality. Gastroesophageal varices noted. Moderate coronary artery calcification. Hypoattenuation of the cardiac blood pool is in keeping with at least moderate anemia. Hepatobiliary: Advanced cirrhosis. No enhancing intrahepatic mass. No intra or extrahepatic biliary ductal dilation. Gallbladder unremarkable. Pancreas: There is inflammatory stranding  and wall thickening involving the second portion the duodenum asymmetrically, particularly involving the pancreatico duodenal  groove. This may reflect changes of duodenal ulceration or duodenitis. The pancreas itself is unremarkable. Spleen: Moderate splenomegaly with the spleen measuring 17.8 cm in greatest dimension. No intrasplenic lesions are seen. Splenic vein is patent. Adrenals/Urinary Tract: The adrenal glands are unremarkable. The kidneys are normal in size and position. Bilateral nonobstructing renal calculi are identified measuring up to 7 mm within the lower pole the right kidney. No hydronephrosis. No ureteral calculi. The bladder is unremarkable. Stomach/Bowel: Moderate ascites. Surgical changes of proximal colectomy are identified with a stable margin seen involving the proximal transverse colon within the right upper quadrant. End ileostomy is seen within the right mid abdomen. Circumferential mucosal thickening involving the proximal jejunum just beyond the ligament of Treitz may relate to changes of an infectious or inflammatory enteritis, malabsorption, or portal enteritis. No evidence of obstruction. No free intraperitoneal gas. No active gastrointestinal hemorrhage identified., Lymphatic: No pathologic adenopathy within the abdomen and pelvis. Reproductive: Prostate is unremarkable. Other: Small right inguinal hernia containing ascitic fluid. Musculoskeletal: No acute bone abnormality. No lytic or blastic bone lesion. IMPRESSION: 1. No active gastrointestinal hemorrhage identified. 2. Advanced cirrhosis with portal venous hypertension including splenomegaly, gastroesophageal and inferior mesenteric-renal varices, and moderate ascites. 3. Inflammatory stranding and wall thickening involving the second portion the duodenum asymmetrically, particularly involving the pancreatico duodenal groove. This may reflect changes of duodenal ulceration or duodenitis. Correlation with endoscopy may be helpful for further evaluation. 4. Circumferential mucosal thickening involving the proximal jejunum just beyond the ligament of  Treitz may relate to changes of an infectious or inflammatory enteritis, malabsorption, or portal enteritis. 5. Bilateral nonobstructing renal calculi. 6. Moderate coronary artery calcification. 7. Hypoattenuation of the cardiac blood pool in keeping with at least moderate anemia. Aortic Atherosclerosis (ICD10-I70.0). Electronically Signed   By: Helyn Numbers M.D.   On: 03/23/2023 20:54   CT HEAD WO CONTRAST ( )  Result Date: 03/23/2023 CLINICAL DATA:  Head trauma EXAM: CT HEAD WITHOUT CONTRAST CT CERVICAL SPINE WITHOUT CONTRAST TECHNIQUE: Multidetector CT imaging of the head and cervical spine was performed following the standard protocol without intravenous contrast. Multiplanar CT image reconstructions of the cervical spine were also generated. RADIATION DOSE REDUCTION: This exam was performed according to the departmental dose-optimization program which includes automated exposure control, adjustment of the mA and/or kV according to patient size and/or use of iterative reconstruction technique. COMPARISON:  CT brain 05/12/2021 FINDINGS: CT HEAD FINDINGS Brain: No acute territorial infarction, hemorrhage, or intracranial mass. Mild chronic small vessel ischemic changes of the white matter. Mild atrophy. Stable ventricle size. Vascular: No hyperdense vessels.  No unexpected calcification Skull: Normal. Negative for fracture or focal lesion. Sinuses/Orbits: No acute finding. Other: None CT CERVICAL SPINE FINDINGS Alignment: Trace anterolisthesis C7 on T1. Facet alignment is maintained Skull base and vertebrae: No acute fracture. No primary bone lesion or focal pathologic process. Soft tissues and spinal canal: No prevertebral fluid or swelling. No visible canal hematoma. Disc levels: Moderate disc space narrowing C5-C6, C6-C7 and C7-T1. Facet degenerative changes. Bilateral foraminal narrowing C5-C6 and C6-C7 Upper chest: Negative. Other: None IMPRESSION: No CT evidence for acute intracranial abnormality.  Atrophy and chronic small vessel ischemic changes of the white matter. Degenerative changes of the cervical spine. No acute osseous abnormality. Electronically Signed   By: Jasmine Pang M.D.   On: 03/23/2023 20:49   CT Cervical Spine Wo Contrast  Result Date: 03/23/2023 CLINICAL DATA:  Head trauma EXAM: CT HEAD WITHOUT CONTRAST CT CERVICAL SPINE WITHOUT CONTRAST TECHNIQUE: Multidetector CT imaging of the head and cervical spine was performed following the standard protocol without intravenous contrast. Multiplanar CT image reconstructions of the cervical spine were also generated. RADIATION DOSE REDUCTION: This exam was performed according to the departmental dose-optimization program which includes automated exposure control, adjustment of the mA and/or kV according to patient size and/or use of iterative reconstruction technique. COMPARISON:  CT brain 05/12/2021 FINDINGS: CT HEAD FINDINGS Brain: No acute territorial infarction, hemorrhage, or intracranial mass. Mild chronic small vessel ischemic changes of the white matter. Mild atrophy. Stable ventricle size. Vascular: No hyperdense vessels.  No unexpected calcification Skull: Normal. Negative for fracture or focal lesion. Sinuses/Orbits: No acute finding. Other: None CT CERVICAL SPINE FINDINGS Alignment: Trace anterolisthesis C7 on T1. Facet alignment is maintained Skull base and vertebrae: No acute fracture. No primary bone lesion or focal pathologic process. Soft tissues and spinal canal: No prevertebral fluid or swelling. No visible canal hematoma. Disc levels: Moderate disc space narrowing C5-C6, C6-C7 and C7-T1. Facet degenerative changes. Bilateral foraminal narrowing C5-C6 and C6-C7 Upper chest: Negative. Other: None IMPRESSION: No CT evidence for acute intracranial abnormality. Atrophy and chronic small vessel ischemic changes of the white matter. Degenerative changes of the cervical spine. No acute osseous abnormality. Electronically Signed   By:  Jasmine Pang M.D.   On: 03/23/2023 20:49    Impression: Blood in ileostomy bag, hemoglobin 6.9 on presentation, received 2 unit PRBC transfusion, current hemoglobin 7.3  History of decompensated cirrhosis related to alcohol, MELD sodium 23  (INR 1.6, Cr 1.67, Na 137, TB 5.7) History of hepatic encephalopathy History of ascites History of esophageal varices History of colon cancer status post surgery  Thrombocytopenia, platelets 77 Macrocytosis, MCV 107.3 CT abdomen and pelvis: Advanced cirrhosis with portal hypertension, gastroesophageal and inferior mesenteric renal varices, moderate ascites, inflammatory stranding in second portion of duodenum?  Duodenal ulcer versus duodenitis, normal pancreas, moderate splenomegaly, circumferential mucosal thickening involving proximal jejunum just beyond ligament of Treitz, no active GI bleeding on angiogram  Plan: Start patient on clear liquid diet and keep n.p.o. postmidnight. Have started him on octreotide 50 mcg IV 1 dose and then 50 mcg/h IV infusion. He has received Protonix 80 mg IV x 1 and is continued on Protonix 8 mg/h IV infusion. I will resume Xifaxan 550 mg twice a day. Diuretics to be on hold until blood pressures are stable and there is no further evidence of GI bleed. I plan EGD with possible banding of varices and possible ileoscopy tomorrow in a.m.. The risks and the benefits of the procedure were discussed with the patient in details. He understands and verbalizes consent.   LOS: 1 day   Kerin Salen, MD  03/24/2023, 4:03 PM

## 2023-03-24 NOTE — Hospital Course (Addendum)
Martin Anthony is a 57 y.o. M with colon CA s/p hemicolectomy and ostomy in place, alcohol dependence, cirrhosis, portal HTN, hx varices who presented with blood in ileostomy.  Patient admitted and underwent EGD with banding of varices.  Developed recurrent hemorrhage with shock requiring vasopressors and transfer to ICU.    While in the ICU, delirium developed requiring Precedex.  This was transitioned to phenobarbital and hemorrhage stabilized.    Subsequent hospital course complicated by persistent encephalopathy, recurrent ascites

## 2023-03-24 NOTE — Consult Note (Signed)
[facility] Gastroenterology Consult  Referring Provider: ER Primary Care Physician:  Nelwyn Salisbury, MD Primary Gastroenterologist: Gentry Fitz  Reason for Consultation: Blood in ileostomy bag  HPI: Martin Anthony is a 57 y.o. male with history of ascending colon cancer, status post hemicolectomy with ileostomy, significant alcohol use and cirrhosis came to the ER when he noticed large amount of dark and bloody output in his ostomy bag. Patient states he normally empties it every time he goes to the bathroom to urinate and has not seen blood in ostomy bag has yesterday. Patient states for his colon cancer he gets treatment at [facility] where he had surgery, was told it was early stage and did not require any chemotherapy or radiation. He complains of mild nausea but denies vomiting. He has occasional passage of mucus per rectum. He denies episodes of hematemesis or coffee-ground emesis.  He denies use of aspirin, NSAIDs or blood thinners/antiplatelets, Goody powders. Denies acid reflux, heartburn, difficulty swallowing, pain on swallowing, unintentional weight loss, loss of appetite. Complains of occasional abdominal cramping and pain. Patient reports being diagnosed with alcohol-related cirrhosis many years ago but remains unclear regarding compliance with diuretics, beta-blockers.  He states he cannot tolerate lactulose but takes Xifaxan on a regular basis for history of hepatic encephalopathy.  Prior GI workup: EGD, Dr. Jeani Hawking 01/19/2019 for abnormal CAT scan: Esophageal varices, portal hypertension, repeat recommended in 3 years Colonoscopy, Dr. Luisa Hart on 01/19/2019: 7 cm ascending colon mass, tubular adenoma with high-grade dysplasia, not amenable for endoscopic resection, 7 mm rectosigmoid tubular adenoma removed. Right hemicolectomy performed in [place] on 10/25/2021.   Past Medical History:  Diagnosis Date   Anxiety    Ascites due to alcoholic cirrhosis (HCC)    Cirrhosis with  alcoholism (HCC)    Depression    Diabetes mellitus without complication (HCC)    GERD (gastroesophageal reflux disease)    Gout    Hyperlipidemia    Hypertension    Primary adenocarcinoma of ascending colon (HCC)    Substance abuse (HCC)    alcoholism    Type 2 diabetes mellitus with diabetic neuropathy [facility])     Past Surgical History:  Procedure Laterality Date   BIOPSY  01/19/2019   Procedure: BIOPSY;  Surgeon: Jeani Hawking, MD;  Location: [facility] ENDOSCOPY;  Service: Endoscopy;;   COLON SURGERY  10/25/2021   right hemicolectomy with ostomy at [facility] in [place]   COLONOSCOPY N/A 01/19/2019   Procedure: COLONOSCOPY;  Surgeon: Jeani Hawking, MD;  Location: [facility] ENDOSCOPY;  Service: Endoscopy;  Laterality: N/A;   ESOPHAGOGASTRODUODENOSCOPY (EGD) WITH PROPOFOL N/A 01/19/2019   Procedure: ESOPHAGOGASTRODUODENOSCOPY (EGD) WITH PROPOFOL;  Surgeon: Jeani Hawking, MD;  Location: [facility] ENDOSCOPY;  Service: Endoscopy;  Laterality: N/A;   HEMOSTASIS CLIP PLACEMENT  01/19/2019   Procedure: HEMOSTASIS CLIP PLACEMENT;  Surgeon: Jeani Hawking, MD;  Location: [facility] ENDOSCOPY;  Service: Endoscopy;;   HERNIA REPAIR     umbilical    INGUINAL HERNIA REPAIR Bilateral    IR FLUORO GUIDE CV LINE RIGHT  05/13/2021   IR PARACENTESIS  05/19/2021   IR US GUIDE VASC ACCESS RIGHT  05/13/2021   POLYPECTOMY  01/19/2019   Procedure: POLYPECTOMY;  Surgeon: Jeani Hawking, MD;  Location: [facility] ENDOSCOPY;  Service: Endoscopy;;    Prior to Admission medications   Medication Sig Start Date End Date Taking? Authorizing Provider  folic acid (FOLVITE) 1 MG tablet Take 1 tablet (1 mg total) by mouth daily. 05/21/21  Yes Leroy Sea, MD  furosemide (LASIX) 40  MG tablet TAKE 1 TABLET BY MOUTH EVERY DAY 01/25/23  Yes Nelwyn Salisbury, MD  gabapentin (NEURONTIN) 300 MG capsule Take 1 capsule (300 mg total) by mouth 3 (three) times daily. 02/02/23  Yes Nelwyn Salisbury, MD  Iron, Ferrous Sulfate, 325 (65 Fe) MG TABS Take 325 mg by  mouth daily. 12/18/21  Yes Nelwyn Salisbury, MD  MAGNESIUM PO Take 1 tablet by mouth daily.   Yes [provider]  nadolol (CORGARD) 20 MG tablet Take 1 tablet (20 mg total) by mouth daily. 12/05/21  Yes Nelwyn Salisbury, MD  pantoprazole (PROTONIX) 40 MG tablet Take 1 tablet (40 mg total) by mouth daily. 05/21/21  Yes Leroy Sea, MD  rifaximin (XIFAXAN) 550 MG TABS tablet Take 1 tablet (550 mg total) by mouth 2 (two) times daily. 01/20/22  Yes Nelwyn Salisbury, MD  sertraline (ZOLOFT) 50 MG tablet Take 1 tablet (50 mg total) by mouth daily. 02/02/23  Yes Nelwyn Salisbury, MD  spironolactone (ALDACTONE) 25 MG tablet TAKE 1 TABLET (25 MG TOTAL) BY MOUTH DAILY AT 12 NOON. 01/25/23  Yes Nelwyn Salisbury, MD  thiamine 100 MG tablet Take 1 tablet (100 mg total) by mouth daily. 05/21/21  Yes Leroy Sea, MD  carvedilol (COREG) 3.125 MG tablet Take 3.125 mg by mouth 2 (two) times daily. Patient not taking: Reported on 03/24/2023    [provider]  ketoconazole (NIZORAL) 2 % cream Apply 1 Application topically 2 (two) times daily. Patient not taking: Reported on 03/24/2023 03/12/23   Nelwyn Salisbury, MD  sodium bicarbonate 650 MG tablet Take 1 tablet (650 mg total) by mouth 2 (two) times daily. Patient not taking: Reported on 03/24/2023 05/21/21   Leroy Sea, MD    Current Facility-Administered Medications  Medication Dose Route Frequency Provider Last Rate Last Admin   0.9 %  sodium chloride infusion   Intravenous Continuous Kerin Salen, MD       HYDROmorphone (DILAUDID) injection 0.5-1 mg  0.5-1 mg Intravenous Q4H PRN Opyd, Lavone Neri, MD   1 mg at 03/24/23 1412   LORazepam (ATIVAN) injection 0-4 mg  0-4 mg Intravenous Q6H Opyd, Lavone Neri, MD   4 mg at 03/24/23 0255   Followed by   Melene Muller ON 03/25/2023] LORazepam (ATIVAN) injection 0-4 mg  0-4 mg Intravenous Q12H Opyd, Lavone Neri, MD       LORazepam (ATIVAN) tablet 1-4 mg  1-4 mg Oral Q1H PRN Opyd, Lavone Neri, MD       Or   LORazepam  (ATIVAN) injection 1-4 mg  1-4 mg Intravenous Q1H PRN Opyd, Lavone Neri, MD       octreotide (SANDOSTATIN) 2 mcg/mL load via infusion 50 mcg  50 mcg Intravenous Once Kerin Salen, MD       And   octreotide (SANDOSTATIN) 500 mcg in sodium chloride 0.9 % 250 mL (2 mcg/mL) infusion  50 mcg/hr Intravenous Continuous Kerin Salen, MD       ondansetron [facility]) tablet 4 mg  4 mg Oral Q6H PRN Opyd, Lavone Neri, MD       Or   ondansetron (ZOFRAN) injection 4 mg  4 mg Intravenous Q6H PRN Opyd, Lavone Neri, MD       pantoprozole (PROTONIX) 80 mg /NS 100 mL infusion  8 mg/hr Intravenous Continuous Opyd, Lavone Neri, MD 10 mL/hr at 03/24/23 0837 8 mg/hr at 03/24/23 0837   rifaximin (XIFAXAN) tablet 550 mg  550 mg Oral BID Kerin Salen, MD  sodium chloride flush (NS) 0.9 % injection 3 mL  3 mL Intravenous Q12H Opyd, Lavone Neri, MD   3 mL at 03/24/23 0837   thiamine (VITAMIN B1) injection 100 mg  100 mg Intravenous Daily Opyd, Lavone Neri, MD   100 mg at 03/24/23 0840    Allergies as of 03/23/2023 - Review Complete 03/23/2023  Allergen Reaction Noted   Acetaminophen  01/20/2023   Ibuprofen Other (See Comments) 01/20/2023    Family History  Problem Relation Age of Onset   Hypertension Mother    Diabetes Father    Heart attack Father    Pneumonia Father    Hypertension Father    Diabetes Maternal Grandfather     Social History   Socioeconomic History   Marital status: Divorced    Spouse name: Not on file   Number of children: Not on file   Years of education: Not on file   Highest education level: Bachelor's degree (e.g., BA, AB, BS)  Occupational History   Not on file  Tobacco Use   Smoking status: Former    Current packs/day: 0.00    Types: Cigarettes    Quit date: 08/11/2001    Years since quitting: 21.6   Smokeless tobacco: Never  Substance and Sexual Activity   Alcohol use: Yes    Alcohol/week: 2.0 standard drinks of alcohol    Types: 2 Standard drinks or equivalent per week   Drug use:  No   Sexual activity: Not on file  Other Topics Concern   Not on file  Social History Narrative   Not on file   Social Determinants of Health   Financial Resource Strain: Low Risk  (10/06/2022)   [facility]   Overall Financial Resource Strain (CARDIA)    Difficulty of Paying Living Expenses: Not hard at all  Food Insecurity: Food Insecurity Present (03/23/2023)   Hunger Vital Sign    Worried About Running Out of Food in the Last Year: Sometimes true    Ran Out of Food in the Last Year: Sometimes true  Transportation Needs: No Transportation Needs (03/23/2023)   PRAPARE - Administrator, Civil Service (Medical): No    Lack of Transportation (Non-Medical): No  Recent Concern: Transportation Needs - Unmet Transportation Needs (03/23/2023)   PRAPARE - Administrator, Civil Service (Medical): Yes    Lack of Transportation (Non-Medical): Not on file  Physical Activity: Unknown (12/05/2021)   Exercise Vital Sign    Days of Exercise per Week: 0 days    Minutes of Exercise per Session: Not on file  Stress: Stress Concern Present (12/05/2021)   [employer] of Occupational Health - Occupational Stress Questionnaire    Feeling of Stress : Very much  Social Connections: Unknown (08/11/2022)   [facility] from [facility], [facility]   Social Network    Social Network: Not on file  Intimate Partner Violence: Not At Risk (03/23/2023)   Humiliation, Afraid, Rape, and Kick questionnaire    Fear of Current or Ex-Partner: No    Emotionally Abused: No    Physically Abused: No    Sexually Abused: No    Review of Systems: As per HPI   Physical Exam: Vital signs in last 24 hours: Temp:  [97.8 F (36.6 C)-99.4 F (37.4 C)] 97.9 F (36.6 C) (10/09 1330) Pulse Rate:  [56-81] 78 (10/09 1330) Resp:  [13-22] 17 (10/09 1330) BP: (  125-161)/(70-100) 125/72 (10/09 1330) SpO2:  [100 %]  100 % (10/09 1330) Weight:  [86.3 kg] 86.3 kg (10/09 0456)    General:   Alert,  Well-developed, well-nourished, pleasant and cooperative in NAD Head:  Normocephalic and atraumatic. Eyes:  Icterus.   Prominent pallor  Ears:  Normal auditory acuity. Nose:  No deformity, discharge,  or lesions. Mouth:  No deformity or lesions.  Oropharynx pink & moist. Neck:  Supple; no masses or thyromegaly. Lungs:  Clear throughout to auscultation.   No wheezes, crackles, or rhonchi. No acute distress. Heart:  Regular rate and rhythm; no murmurs, clicks, rubs,  or gallops. Extremities:  Without clubbing or edema. Neurologic:  Alert and  oriented x4;  grossly normal neurologically. Skin:  Intact without significant lesions or rashes. Psych:  Alert and cooperative. Normal mood and affect. Abdomen: Ileostomy noted on right side of the abdomen, liquid yellow stool noted        Lab Results: Recent Labs    03/24/23 0055 03/24/23 0634 03/24/23 0929 03/24/23 1202  WBC 3.3* 2.4*  --  3.2*  HGB 7.5* 6.6* 6.8* 7.3*  HCT 23.1* 20.6* 21.0* 22.1*  PLT 69* 62*  --  77*   BMET Recent Labs    03/23/23 1930 03/23/23 1946 03/24/23 0055  NA 136 141 137  K 4.4 4.8 4.3  CL 109 111 111  CO2 18*  --  20*  GLUCOSE 163* 158* 152*  BUN 22* 19 23*  CREATININE 1.75* 2.00* 1.67*  CALCIUM 8.3*  --  7.7*   LFT Recent Labs    03/24/23 0055  PROT 5.6*  ALBUMIN 2.3*  AST 83*  ALT 27  ALKPHOS 146*  BILITOT 5.7*   PT/INR Recent Labs    03/23/23 1930  LABPROT 18.9*  INR 1.6*    Studies/Results: CT Angio Abd/Pel W and/or Wo Contrast  Result Date: 03/23/2023 CLINICAL DATA:  Lower gastrointestinal hemorrhage EXAM: CTA ABDOMEN AND PELVIS WITHOUT AND WITH CONTRAST TECHNIQUE: Multidetector CT imaging of the abdomen and pelvis was performed using the standard protocol during bolus administration of intravenous contrast. Multiplanar reconstructed images and MIPs were obtained and reviewed to evaluate the  vascular anatomy. RADIATION DOSE REDUCTION: This exam was performed according to the departmental dose-optimization program which includes automated exposure control, adjustment of the mA and/or kV according to patient size and/or use of iterative reconstruction technique. CONTRAST:  OMNIPAQUE IOHEXOL 350 MG/ML SOLN COMPARISON:  05/12/2021 FINDINGS: VASCULAR Aorta: Normal caliber aorta without aneurysm, dissection, vasculitis or significant stenosis. Mild atherosclerotic calcification Celiac: Patent without evidence of aneurysm, dissection, vasculitis or significant stenosis. SMA: Patent without evidence of aneurysm, dissection, vasculitis or significant stenosis. Renals: Both renal arteries are patent without evidence of aneurysm, dissection, vasculitis, fibromuscular dysplasia or significant stenosis. IMA: Patent without evidence of aneurysm, dissection, vasculitis or significant stenosis. Inflow: Patent without evidence of aneurysm, dissection, vasculitis or significant stenosis. Proximal Outflow: Bilateral common femoral and visualized portions of the superficial and profunda femoral arteries are patent without evidence of aneurysm, dissection, vasculitis or significant stenosis. Veins: Portosystemic collateralization is seen between the inferior mesenteric vein and left renal vein. The abdominal venous vasculature is otherwise unremarkable. Review of the MIP images confirms the above findings. NON-VASCULAR Lower chest: No acute abnormality. Gastroesophageal varices noted. Moderate coronary artery calcification. Hypoattenuation of the cardiac blood pool is in keeping with at least moderate anemia. Hepatobiliary: Advanced cirrhosis. No enhancing intrahepatic mass. No intra or extrahepatic biliary ductal dilation. Gallbladder unremarkable. Pancreas: There is inflammatory stranding  and wall thickening involving the second portion the duodenum asymmetrically, particularly involving the pancreatico duodenal  groove. This may reflect changes of duodenal ulceration or duodenitis. The pancreas itself is unremarkable. Spleen: Moderate splenomegaly with the spleen measuring 17.8 cm in greatest dimension. No intrasplenic lesions are seen. Splenic vein is patent. Adrenals/Urinary Tract: The adrenal glands are unremarkable. The kidneys are normal in size and position. Bilateral nonobstructing renal calculi are identified measuring up to 7 mm within the lower pole the right kidney. No hydronephrosis. No ureteral calculi. The bladder is unremarkable. Stomach/Bowel: Moderate ascites. Surgical changes of proximal colectomy are identified with a stable margin seen involving the proximal transverse colon within the right upper quadrant. End ileostomy is seen within the right mid abdomen. Circumferential mucosal thickening involving the proximal jejunum just beyond the ligament of Treitz may relate to changes of an infectious or inflammatory enteritis, malabsorption, or portal enteritis. No evidence of obstruction. No free intraperitoneal gas. No active gastrointestinal hemorrhage identified., Lymphatic: No pathologic adenopathy within the abdomen and pelvis. Reproductive: Prostate is unremarkable. Other: Small right inguinal hernia containing ascitic fluid. Musculoskeletal: No acute bone abnormality. No lytic or blastic bone lesion. IMPRESSION: 1. No active gastrointestinal hemorrhage identified. 2. Advanced cirrhosis with portal venous hypertension including splenomegaly, gastroesophageal and inferior mesenteric-renal varices, and moderate ascites. 3. Inflammatory stranding and wall thickening involving the second portion the duodenum asymmetrically, particularly involving the pancreatico duodenal groove. This may reflect changes of duodenal ulceration or duodenitis. Correlation with endoscopy may be helpful for further evaluation. 4. Circumferential mucosal thickening involving the proximal jejunum just beyond the ligament of  Treitz may relate to changes of an infectious or inflammatory enteritis, malabsorption, or portal enteritis. 5. Bilateral nonobstructing renal calculi. 6. Moderate coronary artery calcification. 7. Hypoattenuation of the cardiac blood pool in keeping with at least moderate anemia. Aortic Atherosclerosis (ICD10-I70.0). Electronically Signed   By: Helyn Numbers M.D.   On: 03/23/2023 20:54   CT HEAD WO CONTRAST ( )  Result Date: 03/23/2023 CLINICAL DATA:  Head trauma EXAM: CT HEAD WITHOUT CONTRAST CT CERVICAL SPINE WITHOUT CONTRAST TECHNIQUE: Multidetector CT imaging of the head and cervical spine was performed following the standard protocol without intravenous contrast. Multiplanar CT image reconstructions of the cervical spine were also generated. RADIATION DOSE REDUCTION: This exam was performed according to the departmental dose-optimization program which includes automated exposure control, adjustment of the mA and/or kV according to patient size and/or use of iterative reconstruction technique. COMPARISON:  CT brain 05/12/2021 FINDINGS: CT HEAD FINDINGS Brain: No acute territorial infarction, hemorrhage, or intracranial mass. Mild chronic small vessel ischemic changes of the white matter. Mild atrophy. Stable ventricle size. Vascular: No hyperdense vessels.  No unexpected calcification Skull: Normal. Negative for fracture or focal lesion. Sinuses/Orbits: No acute finding. Other: None CT CERVICAL SPINE FINDINGS Alignment: Trace anterolisthesis C7 on T1. Facet alignment is maintained Skull base and vertebrae: No acute fracture. No primary bone lesion or focal pathologic process. Soft tissues and spinal canal: No prevertebral fluid or swelling. No visible canal hematoma. Disc levels: Moderate disc space narrowing C5-C6, C6-C7 and C7-T1. Facet degenerative changes. Bilateral foraminal narrowing C5-C6 and C6-C7 Upper chest: Negative. Other: None IMPRESSION: No CT evidence for acute intracranial abnormality.  Atrophy and chronic small vessel ischemic changes of the white matter. Degenerative changes of the cervical spine. No acute osseous abnormality. Electronically Signed   By: Jasmine Pang M.D.   On: 03/23/2023 20:49   CT Cervical Spine Wo Contrast  Result Date: 03/23/2023 CLINICAL DATA:  Head trauma EXAM: CT HEAD WITHOUT CONTRAST CT CERVICAL SPINE WITHOUT CONTRAST TECHNIQUE: Multidetector CT imaging of the head and cervical spine was performed following the standard protocol without intravenous contrast. Multiplanar CT image reconstructions of the cervical spine were also generated. RADIATION DOSE REDUCTION: This exam was performed according to the departmental dose-optimization program which includes automated exposure control, adjustment of the mA and/or kV according to patient size and/or use of iterative reconstruction technique. COMPARISON:  CT brain 05/12/2021 FINDINGS: CT HEAD FINDINGS Brain: No acute territorial infarction, hemorrhage, or intracranial mass. Mild chronic small vessel ischemic changes of the white matter. Mild atrophy. Stable ventricle size. Vascular: No hyperdense vessels.  No unexpected calcification Skull: Normal. Negative for fracture or focal lesion. Sinuses/Orbits: No acute finding. Other: None CT CERVICAL SPINE FINDINGS Alignment: Trace anterolisthesis C7 on T1. Facet alignment is maintained Skull base and vertebrae: No acute fracture. No primary bone lesion or focal pathologic process. Soft tissues and spinal canal: No prevertebral fluid or swelling. No visible canal hematoma. Disc levels: Moderate disc space narrowing C5-C6, C6-C7 and C7-T1. Facet degenerative changes. Bilateral foraminal narrowing C5-C6 and C6-C7 Upper chest: Negative. Other: None IMPRESSION: No CT evidence for acute intracranial abnormality. Atrophy and chronic small vessel ischemic changes of the white matter. Degenerative changes of the cervical spine. No acute osseous abnormality. Electronically Signed   By:  Jasmine Pang M.D.   On: 03/23/2023 20:49    Impression: Blood in ileostomy bag, hemoglobin 6.9 on presentation, received 2 unit PRBC transfusion, current hemoglobin 7.3  History of decompensated cirrhosis related to alcohol, MELD sodium 23  (INR 1.6, Cr 1.67, Na 137, TB 5.7) History of hepatic encephalopathy History of ascites History of esophageal varices History of colon cancer status post surgery  Thrombocytopenia, platelets 77 Macrocytosis, MCV 107.3 CT abdomen and pelvis: Advanced cirrhosis with portal hypertension, gastroesophageal and inferior mesenteric renal varices, moderate ascites, inflammatory stranding in second portion of duodenum?  Duodenal ulcer versus duodenitis, normal pancreas, moderate splenomegaly, circumferential mucosal thickening involving proximal jejunum just beyond ligament of Treitz, no active GI bleeding on angiogram  Plan: Start patient on clear liquid diet and keep n.p.o. postmidnight. Have started him on octreotide 50 mcg IV 1 dose and then 50 mcg/h IV infusion. He has received Protonix 80 mg IV x 1 and is continued on Protonix 8 mg/h IV infusion. I will resume Xifaxan 550 mg twice a day. Diuretics to be on hold until blood pressures are stable and there is no further evidence of GI bleed. I plan EGD with possible banding of varices and possible ileoscopy tomorrow in a.m.. The risks and the benefits of the procedure were discussed with the patient in details. He understands and verbalizes consent.   LOS: 1 day   Kerin Salen, MD  03/24/2023, 4:03 PM

## 2023-03-24 NOTE — Plan of Care (Signed)
  Problem: Education: Goal: Knowledge of General Education information will improve Description: Including pain rating scale, medication(s)/side effects and non-pharmacologic comfort measures Outcome: Progressing   Problem: Clinical Measurements: Goal: Will remain free from infection Outcome: Progressing   Problem: Coping: Goal: Level of anxiety will decrease Outcome: Progressing   Problem: Pain Managment: Goal: General experience of comfort will improve Outcome: Progressing   Problem: Education: Goal: Ability to identify signs and symptoms of gastrointestinal bleeding will improve Outcome: Progressing   Problem: Bowel/Gastric: Goal: Will show no signs and symptoms of gastrointestinal bleeding Outcome: Progressing

## 2023-03-25 ENCOUNTER — Inpatient Hospital Stay (HOSPITAL_COMMUNITY): Payer: Medicaid Other | Admitting: Anesthesiology

## 2023-03-25 ENCOUNTER — Encounter (HOSPITAL_COMMUNITY)
Admission: EM | Disposition: A | Discharge status: Home / Self Care | Payer: Self-pay | Source: Home / Self Care | Attending: Family Medicine

## 2023-03-25 ENCOUNTER — Encounter (HOSPITAL_COMMUNITY): Payer: Self-pay | Admitting: Family Medicine

## 2023-03-25 ENCOUNTER — Inpatient Hospital Stay (HOSPITAL_COMMUNITY): Payer: Medicaid Other

## 2023-03-25 DIAGNOSIS — K922 Gastrointestinal hemorrhage, unspecified: Secondary | ICD-10-CM | POA: Diagnosis not present

## 2023-03-25 DIAGNOSIS — I129 Hypertensive chronic kidney disease with stage 1 through stage 4 chronic kidney disease, or unspecified chronic kidney disease: Secondary | ICD-10-CM

## 2023-03-25 DIAGNOSIS — R1010 Upper abdominal pain, unspecified: Secondary | ICD-10-CM

## 2023-03-25 DIAGNOSIS — N189 Chronic kidney disease, unspecified: Secondary | ICD-10-CM

## 2023-03-25 DIAGNOSIS — E1122 Type 2 diabetes mellitus with diabetic chronic kidney disease: Secondary | ICD-10-CM

## 2023-03-25 DIAGNOSIS — K7460 Unspecified cirrhosis of liver: Secondary | ICD-10-CM

## 2023-03-25 DIAGNOSIS — N179 Acute kidney failure, unspecified: Secondary | ICD-10-CM | POA: Diagnosis not present

## 2023-03-25 HISTORY — PX: ILEOSCOPY: SHX5434

## 2023-03-25 HISTORY — PX: ESOPHAGOGASTRODUODENOSCOPY (EGD) WITH PROPOFOL: SHX5813

## 2023-03-25 HISTORY — PX: ESOPHAGEAL BANDING: SHX5518

## 2023-03-25 LAB — BASIC METABOLIC PANEL
Anion gap: 5 (ref 5–15)
BUN: 37 mg/dL — ABNORMAL HIGH (ref 6–20)
CO2: 18 mmol/L — ABNORMAL LOW (ref 22–32)
Calcium: 6.8 mg/dL — ABNORMAL LOW (ref 8.9–10.3)
Chloride: 110 mmol/L (ref 98–111)
Creatinine, Ser: 2.5 mg/dL — ABNORMAL HIGH (ref 0.61–1.24)
GFR, Estimated: 30 mL/min — ABNORMAL LOW (ref >60)
Glucose, Bld: 148 mg/dL — ABNORMAL HIGH (ref 70–99)
Potassium: 4.5 mmol/L (ref 3.5–5.1)
Sodium: 133 mmol/L — ABNORMAL LOW (ref 135–145)

## 2023-03-25 LAB — CBC
HCT: 23.7 % — ABNORMAL LOW (ref 39.0–52.0)
HCT: 26 % — ABNORMAL LOW (ref 39.0–52.0)
Hemoglobin: 7.5 g/dL — ABNORMAL LOW (ref 13.0–17.0)
Hemoglobin: 8.1 g/dL — ABNORMAL LOW (ref 13.0–17.0)
MCH: 35.1 pg — ABNORMAL HIGH (ref 26.0–34.0)
MCH: 35.2 pg — ABNORMAL HIGH (ref 26.0–34.0)
MCHC: 31.2 g/dL (ref 30.0–36.0)
MCHC: 31.6 g/dL (ref 30.0–36.0)
MCV: 111.3 fL — ABNORMAL HIGH (ref 80.0–100.0)
MCV: 112.6 fL — ABNORMAL HIGH (ref 80.0–100.0)
Platelets: 106 10*3/uL — ABNORMAL LOW (ref 150–400)
Platelets: 80 10*3/uL — ABNORMAL LOW (ref 150–400)
RBC: 2.13 MIL/uL — ABNORMAL LOW (ref 4.22–5.81)
RBC: 2.31 MIL/uL — ABNORMAL LOW (ref 4.22–5.81)
RDW: 16 % — ABNORMAL HIGH (ref 11.5–15.5)
RDW: 16.6 % — ABNORMAL HIGH (ref 11.5–15.5)
WBC: 3.1 10*3/uL — ABNORMAL LOW (ref 4.0–10.5)
WBC: 7 10*3/uL (ref 4.0–10.5)
nRBC: 0 % (ref 0.0–0.2)
nRBC: 0 % (ref 0.0–0.2)

## 2023-03-25 LAB — COMPREHENSIVE METABOLIC PANEL
ALT: 23 U/L (ref 0–44)
AST: 70 U/L — ABNORMAL HIGH (ref 15–41)
Albumin: 2.2 g/dL — ABNORMAL LOW (ref 3.5–5.0)
Alkaline Phosphatase: 111 U/L (ref 38–126)
Anion gap: 6 (ref 5–15)
BUN: 34 mg/dL — ABNORMAL HIGH (ref 6–20)
CO2: 18 mmol/L — ABNORMAL LOW (ref 22–32)
Calcium: 7.2 mg/dL — ABNORMAL LOW (ref 8.9–10.3)
Chloride: 112 mmol/L — ABNORMAL HIGH (ref 98–111)
Creatinine, Ser: 2.42 mg/dL — ABNORMAL HIGH (ref 0.61–1.24)
GFR, Estimated: 31 mL/min — ABNORMAL LOW (ref >60)
Glucose, Bld: 163 mg/dL — ABNORMAL HIGH (ref 70–99)
Potassium: 4.8 mmol/L (ref 3.5–5.1)
Sodium: 136 mmol/L (ref 135–145)
Total Bilirubin: 6 mg/dL — ABNORMAL HIGH (ref 0.3–1.2)
Total Protein: 5.3 g/dL — ABNORMAL LOW (ref 6.5–8.1)

## 2023-03-25 LAB — MAGNESIUM: Magnesium: 1.7 mg/dL (ref 1.7–2.4)

## 2023-03-25 SURGERY — ESOPHAGOGASTRODUODENOSCOPY (EGD) WITH PROPOFOL
Anesthesia: Monitor Anesthesia Care

## 2023-03-25 MED ORDER — CHLORDIAZEPOXIDE HCL 25 MG PO CAPS: 25.0000 mg | ORAL_CAPSULE | Freq: Every day | ORAL | Status: DC

## 2023-03-25 MED ORDER — CHLORDIAZEPOXIDE HCL 25 MG PO CAPS: 25.0000 mg | ORAL_CAPSULE | Freq: Three times a day (TID) | ORAL | Status: DC

## 2023-03-25 MED ORDER — CHLORDIAZEPOXIDE HCL 25 MG PO CAPS
25.0000 mg | ORAL_CAPSULE | Freq: Once | ORAL | Status: AC
  Administered 2023-03-25: 25 mg via ORAL
  Filled 2023-03-25: qty 1

## 2023-03-25 MED ORDER — CHLORDIAZEPOXIDE HCL 25 MG PO CAPS: 25.0000 mg | ORAL_CAPSULE | ORAL | Status: DC

## 2023-03-25 MED ORDER — PROPOFOL 500 MG/50ML IV EMUL
INTRAVENOUS | Status: DC | PRN
  Administered 2023-03-25: 125 ug/kg/min via INTRAVENOUS

## 2023-03-25 MED ORDER — CHLORDIAZEPOXIDE HCL 25 MG PO CAPS
25.0000 mg | ORAL_CAPSULE | Freq: Four times a day (QID) | ORAL | Status: DC
  Administered 2023-03-25: 25 mg via ORAL
  Filled 2023-03-25: qty 1

## 2023-03-25 MED ORDER — PANTOPRAZOLE SODIUM 40 MG PO TBEC
40.0000 mg | DELAYED_RELEASE_TABLET | Freq: Two times a day (BID) | ORAL | Status: DC
  Administered 2023-03-25: 40 mg via ORAL
  Filled 2023-03-25: qty 1

## 2023-03-25 MED ORDER — NADOLOL 20 MG PO TABS
20.0000 mg | ORAL_TABLET | Freq: Every day | ORAL | Status: DC
  Administered 2023-03-25: 20 mg via ORAL
  Filled 2023-03-25 (×2): qty 1

## 2023-03-25 MED ORDER — SODIUM CHLORIDE 0.9 % IV BOLUS
1000.0000 mL | Freq: Once | INTRAVENOUS | Status: AC
  Administered 2023-03-25: 1000 mL via INTRAVENOUS

## 2023-03-25 MED ORDER — PROPOFOL 500 MG/50ML IV EMUL
INTRAVENOUS | Status: AC
  Filled 2023-03-25: qty 50

## 2023-03-25 MED ORDER — SODIUM CHLORIDE 0.9 % IV BOLUS
500.0000 mL | Freq: Once | INTRAVENOUS | Status: AC
  Administered 2023-03-25: 500 mL via INTRAVENOUS

## 2023-03-25 MED ORDER — PROPOFOL 10 MG/ML IV BOLUS
INTRAVENOUS | Status: DC | PRN
  Administered 2023-03-25 (×3): 30 mg via INTRAVENOUS

## 2023-03-25 SURGICAL SUPPLY — 15 items

## 2023-03-25 NOTE — Significant Event (Addendum)
Rapid Response Event Note   Reason for Call :  Called by bedside RN Timikial at 2223 because patient was bleeding significant amount from his ileostomy pouch and had a sudden increased CIWA score.  Initial Focused Assessment:  Patient is oriented to self, but time, place, and situation are altered. Patient has had approximately 500-600 ml blood (some with large clot) from his ileostomy. VS: 98.8 F oral temp, BP 120/78 MAP 93, HR 85, RR 20, 99% O2 saturation on room air. Patient is without labored breathing and protecting airway. Lung sounds clear bilaterally. No other bleeding seen elsewhere. Patient is on Octreotide drip and has two patent peripheral IV sites.  Interventions:  Assessment as above. Contacted on call provider E. Anna Genre, NP via secure chat to update on situation. Orders given to administer 1 L NS bolus, Librium and Ativan as prescribed, and for labs (CBC, CMP, Mag). Ileostomy set up to drain into foley bag to gravity. Bedside monitor set up to measure serial BP and telemetry.  Plan of Care:  No change in level of care at this time per E. Anna Genre, NP. Nurse Timikial knows to monitor vitals, fall precautions, intake/output, and notify provider and rapid RN for any changes in condition.    Lamona Curl, RN  Addendum:  At approximately [phone], informed that patient's BP dropping. Reyes Ivan, NP gave orders to transfer him to ICU/SD, give Albumin, Levophed, obtain CT A/P. Patient will be transferred to room 1235 as soon as possible.

## 2023-03-25 NOTE — Transfer of Care (Signed)
Immediate Anesthesia Transfer of Care Note  Patient: Martin Anthony  Procedure(s) Performed: ESOPHAGOGASTRODUODENOSCOPY (EGD) WITH PROPOFOL ESOPHAGEAL BANDING ILEOSCOPY THROUGH STOMA  Patient Location: PACU and Endoscopy Unit  Anesthesia Type:MAC  Level of Consciousness: awake and sedated  Airway & Oxygen Therapy: Patient Spontanous Breathing and Patient connected to nasal cannula oxygen  Post-op Assessment: Report given to RN and Post -op Vital signs reviewed and stable  Post vital signs: Reviewed and stable  Last Vitals:  Vitals Value Taken Time  BP 132/68 03/25/23 1320  Temp    Pulse 85 03/25/23 1321  Resp 19 03/25/23 1321  SpO2 100 % 03/25/23 1321  Vitals shown include unfiled device data.  Last Pain:  Vitals:   03/25/23 1145  TempSrc: Temporal  PainSc: 10-Worst pain ever      Patients Stated Pain Goal: 5 (03/24/23 1412)  Complications: No notable events documented.

## 2023-03-25 NOTE — Op Note (Signed)
[facility] Patient Name: Martin Anthony Procedure Date: 03/25/2023 MRN: [id number] Attending MD: Kerin Salen , MD, [id number] Date of Birth: May 12, 1966 CSN: [id number] Age: 57 Admit Type: Inpatient Procedure:                Ileoscopy Indications:              History of right hemicolectomy, Personal history of                            malignant neoplasm of the colon, Gastrointestinal                            bleeding, dark and fresh blood noted in ileostomy                            bag Providers:                Kerin Salen, MD, Marge Duncans, RN, Beryle Beams,                            Technician, Geoffery Lyons, Technician Referring MD:             [facility] Medicines:                Monitored Anesthesia Care Complications:            No immediate complications. Estimated Blood Loss:     Estimated blood loss: none. Procedure:                Pre-Anesthesia Assessment:                           - Prior to the procedure, a History and Physical                            was performed, and patient medications and                            allergies were reviewed. The patient's tolerance of                            previous anesthesia was also reviewed. The risks                            and benefits of the procedure and the sedation                            options and risks were discussed with the patient.                            All questions were answered, and informed consent                            was obtained. Prior Anticoagulants: The patient has  taken no anticoagulant or antiplatelet agents. ASA                            Grade Assessment: III - A patient with severe                            systemic disease. After reviewing the risks and                            benefits, the patient was deemed in satisfactory                            condition to undergo the procedure.                           After  I obtained informed consent, the scope was                            passed under direct vision. Throughout the                            procedure, the patient's blood pressure, pulse, and                            oxygen saturations were monitored continuously. The                            [id number]-H190 ([id number]) Olympus endoscope was introduced                            through the ileostomy and advanced to the                            neo-terminal ileum. After I obtained informed                            consent, the scope was passed under direct vision.                            Throughout the procedure, the patient's blood                            pressure, pulse, and oxygen saturations were                            monitored continuously. The ileoscopy was performed                            without difficulty. The patient tolerated the                            procedure well. The quality of the bowel  preparation was adequate to identify polyps. Scope In: Scope Out: Findings:      The neo-terminal ileum appeared normal.      The scope was advanced from the ileostomy up to 50 cm into the terminal       ileum.      The mucosa appeared unremarakble.      Yellow stool was noted in the ileum.      There was no evidence of recent or active bleeding noted. Estimated       blood loss: none. Impression:               - The examined portion of the ileum was normal.                           - No specimens collected. Moderate Sedation:      Patient did not receive moderate sedation for this procedure, but       instead received monitored anesthesia care. Recommendation:           - Clear liquid diet for 6 hours as esophageal                            variceal banding was performed, thereafter ok to                            start regular diet today. Procedure Code(s):        --- Professional ---                           [phone], Ileoscopy, through  stoma; diagnostic,                            including collection of specimen(s) by brushing or                            washing, when performed (separate procedure) Diagnosis Code(s):        --- Professional ---                           Z90.49, Acquired absence of other specified parts                            of digestive tract                           Z85.038, Personal history of other malignant                            neoplasm of large intestine                           K92.2, Gastrointestinal hemorrhage, unspecified CPT copyright 2022 [employer]. All rights reserved. The codes documented in this report are preliminary and upon coder review may  be revised to meet current compliance requirements. Kerin Salen, MD 03/25/2023 1:22:44 PM This report has been signed electronically. Number of Addenda: 0

## 2023-03-25 NOTE — Op Note (Signed)
[facility] Patient Name: Martin Anthony Procedure Date: 03/25/2023 MRN: [id number] Attending MD: Kerin Salen , MD, [id number] Date of Birth: [birth year] CSN: [id number] Age: 57 Admit Type: Inpatient Procedure:                Upper GI endoscopy Indications:              Suspected upper gastrointestinal bleeding,                            Cirrhosis with suspected esophageal varices, dark                            and fresh blood in ileostomy bag Providers:                Kerin Salen, MD, Marge Duncans, RN, Beryle Beams,                            Technician, Geoffery Lyons, Technician Referring MD:             [facility] Medicines:                Monitored Anesthesia Care Complications:            No immediate complications. Estimated Blood Loss:     Estimated blood loss: none. Procedure:                Pre-Anesthesia Assessment:                           - Prior to the procedure, a History and Physical                            was performed, and patient medications and                            allergies were reviewed. The patient's tolerance of                            previous anesthesia was also reviewed. The risks                            and benefits of the procedure and the sedation                            options and risks were discussed with the patient.                            All questions were answered, and informed consent                            was obtained. Prior Anticoagulants: The patient has                            taken no anticoagulant or antiplatelet agents. ASA  Grade Assessment: III - A patient with severe                            systemic disease. After reviewing the risks and                            benefits, the patient was deemed in satisfactory                            condition to undergo the procedure.                           After obtaining informed consent, the endoscope was                             passed under direct vision. Throughout the                            procedure, the patient's blood pressure, pulse, and                            oxygen saturations were monitored continuously. The                            GIF-H190 ([id number]) Olympus endoscope was introduced                            through the mouth, and advanced to the second part                            of duodenum. The upper GI endoscopy was                            accomplished without difficulty. The patient                            tolerated the procedure well. Scope In: Scope Out: Findings:      Grade III varices were found in the middle third of the esophagus and in       the lower third of the esophagus. Four bands were successfully placed       with complete eradication, resulting in deflation of varices. There was       no bleeding during and at the end of the procedure.      Severe, diffuse portal hypertensive gastropathy was found in the entire       examined stomach. Easy oozing and mucosal friability was noted.      The cardia and gastric fundus were normal on retroflexion.      Localized severely congested mucosa without active bleeding and with no       stigmata of bleeding was found in the first portion of the duodenum.      The second portion of the duodenum was normal. Impression:               - Grade III esophageal varices. Completely  eradicated. Banded.                           - Portal hypertensive gastropathy.                           - Congested duodenal mucosa.                           - Normal second portion of the duodenum.                           - No specimens collected. Moderate Sedation:      Patient did not receive moderate sedation for this procedure, but       instead received monitored anesthesia care. Recommendation:           -Ileoscopy now.                           - Continue octreotide drip for 24 hours,  clear                            liquid diet for 6 hours and then can start regular                            diet thereafter. Procedure Code(s):        --- Professional ---                           [phone], Esophagogastroduodenoscopy, flexible,                            transoral; with band ligation of esophageal/gastric                            varices Diagnosis Code(s):        --- Professional ---                           K74.60, Unspecified cirrhosis of liver                           I85.10, Secondary esophageal varices without                            bleeding                           K76.6, Portal hypertension                           K31.89, Other diseases of stomach and duodenum CPT copyright 2022 American Medical Association. All rights reserved. The codes documented in this report are preliminary and upon coder review may  be revised to meet current compliance requirements. Kerin Salen, MD 03/25/2023 1:17:59 PM This report has been signed electronically. Number of Addenda: 0

## 2023-03-25 NOTE — Anesthesia Procedure Notes (Signed)
Procedure Name: MAC Date/Time: 03/25/2023 12:49 PM  Performed by: Elyn Peers, CRNAPre-anesthesia Checklist: Patient identified, Emergency Drugs available, Suction available, Patient being monitored and Timeout performed Oxygen Delivery Method: Nasal cannula Placement Confirmation: positive ETCO2

## 2023-03-25 NOTE — Plan of Care (Signed)
  Problem: Activity: Goal: Risk for activity intolerance will decrease Outcome: Progressing   Problem: Pain Managment: Goal: General experience of comfort will improve Outcome: Progressing   Problem: Skin Integrity: Goal: Risk for impaired skin integrity will decrease Outcome: Progressing   Problem: Education: Goal: Ability to identify signs and symptoms of gastrointestinal bleeding will improve Outcome: Progressing

## 2023-03-25 NOTE — Progress Notes (Signed)
PROGRESS NOTE    Martin Anthony  ZOX:[id number] DOB: September 14, 1965 DOA: 03/23/2023 PCP: Nelwyn Salisbury, MD   Brief Narrative: Martin Anthony is a 57 y.o. male with a history of alcoholism, cirrhosis, CKD stage II, portal hypertension, esophageal varices, colon cancer status post hemicolectomy.  Patient presented secondary to blood from ileostomy associated abdominal pain concerning for GI bleeding.  Patient found to have associated acute anemia requiring blood transfusion on admission.  [facility] gastroenterology was consulted for management.   Assessment and Plan:  Acute GI bleeding CTA negative for identification of source but was notable for inflammatory changes and wall-thickening  involving the second portion of the duodenum. Patient started empirically on Protonix IV. GI consulted. Patient has a history of esophageal varices and portal hypertension. -GI recommendations: IV Protonix, Octreotide, EGD today  Acute on chronic anemia Baseline hemoglobin of 9-11. Hemoglobin of 7.8 on admission. Patient has required 1 unit of PRBC to date. Hemoglobin down to 6.6 > 6.8 requiring a second unit of PRBC on 10/9. Hemoglobin of 8.1 today. -CBC in AM  Cirrhosis MELD Na of 24 on admission. Lasix and spironolactone held secondary to acute GI bleeding.  Upper abdominal pain Seems to be somewhat pleuritic in nature vs worsened deep inspiration. Unclear etiology. -Chest x-ray  Esophageal varies Portal hypertension -Resume home nadolol if blood pressure remains stable.  AKI on CKD 2 Baseline creatinine appears to be around 1.3. Creatinine of 1.75 with peak to 2. Creatinine initially down to 1.67 but has acutely worsened to 2.42 in setting of poor oral intake by patient. -500 mL normal saline via IV -Encourage oral fluid intake -BMP in AM  Alcohol abuse Continue CIWA.  Colon cancer S/p hemicolectomy. Under observation.  DVT prophylaxis: SCDs Code Status:   Code Status: Full Code Family  Communication: None at bedside Disposition Plan: Discharge home likely in 1-2 days pending stable hemoglobin, GI recommendations and improvement in AKI   Consultants:  [facility]  Procedures:  None  Antimicrobials: None    Subjective: Some upper abdominal pain when taking deep breaths. No other concerns.  Objective: BP (!) 186/71   Pulse 83   Temp 98.1 F (36.7 C) (Temporal)   Resp 16   Ht 6\' 4"  (1.93 m)   Wt 81.6 kg   SpO2 97%   BMI 21.91 kg/m   Examination:  General exam: Appears calm and comfortable Respiratory system: Clear to auscultation. Respiratory effort normal. Cardiovascular system: S1 & S2 heard, RRR. Gastrointestinal system: Abdomen is nondistended, soft and nontender. Normal bowel sounds heard. Brownish/green stool in ostomy bag. Central nervous system: Alert and oriented. No focal neurological deficits. Psychiatry: Judgement and insight appear normal. Mood & affect appropriate.    Data Reviewed: I have personally reviewed following labs and imaging studies  CBC Lab Results  Component Value Date   WBC 3.1 (L) 03/25/2023   RBC 2.31 (L) 03/25/2023   HGB 8.1 (L) 03/25/2023   HCT 26.0 (L) 03/25/2023   MCV 112.6 (H) 03/25/2023   MCH 35.1 (H) 03/25/2023   PLT 80 (L) 03/25/2023   MCHC 31.2 03/25/2023   RDW 16.6 (H) 03/25/2023   LYMPHSABS 0.3 (L) 03/23/2023   MONOABS 0.4 03/23/2023   EOSABS 0.0 03/23/2023   BASOSABS 0.0 03/23/2023     Last metabolic panel Lab Results  Component Value Date   NA 136 03/25/2023   K 4.8 03/25/2023   CL 112 (H) 03/25/2023   CO2 18 (L) 03/25/2023   BUN 34 (H)  03/25/2023   CREATININE 2.42 (H) 03/25/2023   GLUCOSE 163 (H) 03/25/2023   GFRNONAA 31 (L) 03/25/2023   GFRAA >60 01/22/2019   CALCIUM 7.2 (L) 03/25/2023   PHOS 3.1 03/24/2023   PROT 5.3 (L) 03/25/2023   ALBUMIN 2.2 (L) 03/25/2023   BILITOT 6.0 (H) 03/25/2023   ALKPHOS 111 03/25/2023   AST 70 (H) 03/25/2023   ALT 23 03/25/2023   ANIONGAP  6 03/25/2023    GFR: Estimated Creatinine Clearance: 39.8 mL/min (A) (by C-G formula based on SCr of 2.42 mg/dL (H)).  No results found for this or any previous visit (from the past 240 hour(s)).    Radiology Studies: CT Angio Abd/Pel W and/or Wo Contrast  Result Date: 03/23/2023 CLINICAL DATA:  Lower gastrointestinal hemorrhage EXAM: CTA ABDOMEN AND PELVIS WITHOUT AND WITH CONTRAST TECHNIQUE: Multidetector CT imaging of the abdomen and pelvis was performed using the standard protocol during bolus administration of intravenous contrast. Multiplanar reconstructed images and MIPs were obtained and reviewed to evaluate the vascular anatomy. RADIATION DOSE REDUCTION: This exam was performed according to the departmental dose-optimization program which includes automated exposure control, adjustment of the mA and/or kV according to patient size and/or use of iterative reconstruction technique. CONTRAST:  OMNIPAQUE IOHEXOL 350 MG/ML SOLN COMPARISON:  05/12/2021 FINDINGS: VASCULAR Aorta: Normal caliber aorta without aneurysm, dissection, vasculitis or significant stenosis. Mild atherosclerotic calcification Celiac: Patent without evidence of aneurysm, dissection, vasculitis or significant stenosis. SMA: Patent without evidence of aneurysm, dissection, vasculitis or significant stenosis. Renals: Both renal arteries are patent without evidence of aneurysm, dissection, vasculitis, fibromuscular dysplasia or significant stenosis. IMA: Patent without evidence of aneurysm, dissection, vasculitis or significant stenosis. Inflow: Patent without evidence of aneurysm, dissection, vasculitis or significant stenosis. Proximal Outflow: Bilateral common femoral and visualized portions of the superficial and profunda femoral arteries are patent without evidence of aneurysm, dissection, vasculitis or significant stenosis. Veins: Portosystemic collateralization is seen between the inferior mesenteric vein and left renal  vein. The abdominal venous vasculature is otherwise unremarkable. Review of the MIP images confirms the above findings. NON-VASCULAR Lower chest: No acute abnormality. Gastroesophageal varices noted. Moderate coronary artery calcification. Hypoattenuation of the cardiac blood pool is in keeping with at least moderate anemia. Hepatobiliary: Advanced cirrhosis. No enhancing intrahepatic mass. No intra or extrahepatic biliary ductal dilation. Gallbladder unremarkable. Pancreas: There is inflammatory stranding and wall thickening involving the second portion the duodenum asymmetrically, particularly involving the pancreatico duodenal groove. This may reflect changes of duodenal ulceration or duodenitis. The pancreas itself is unremarkable. Spleen: Moderate splenomegaly with the spleen measuring 17.8 cm in greatest dimension. No intrasplenic lesions are seen. Splenic vein is patent. Adrenals/Urinary Tract: The adrenal glands are unremarkable. The kidneys are normal in size and position. Bilateral nonobstructing renal calculi are identified measuring up to 7 mm within the lower pole the right kidney. No hydronephrosis. No ureteral calculi. The bladder is unremarkable. Stomach/Bowel: Moderate ascites. Surgical changes of proximal colectomy are identified with a stable margin seen involving the proximal transverse colon within the right upper quadrant. End ileostomy is seen within the right mid abdomen. Circumferential mucosal thickening involving the proximal jejunum just beyond the ligament of Treitz may relate to changes of an infectious or inflammatory enteritis, malabsorption, or portal enteritis. No evidence of obstruction. No free intraperitoneal gas. No active gastrointestinal hemorrhage identified., Lymphatic: No pathologic adenopathy within the abdomen and pelvis. Reproductive: Prostate is unremarkable. Other: Small right inguinal hernia containing ascitic fluid. Musculoskeletal: No acute bone abnormality. No  lytic or  blastic bone lesion. IMPRESSION: 1. No active gastrointestinal hemorrhage identified. 2. Advanced cirrhosis with portal venous hypertension including splenomegaly, gastroesophageal and inferior mesenteric-renal varices, and moderate ascites. 3. Inflammatory stranding and wall thickening involving the second portion the duodenum asymmetrically, particularly involving the pancreatico duodenal groove. This may reflect changes of duodenal ulceration or duodenitis. Correlation with endoscopy may be helpful for further evaluation. 4. Circumferential mucosal thickening involving the proximal jejunum just beyond the ligament of Treitz may relate to changes of an infectious or inflammatory enteritis, malabsorption, or portal enteritis. 5. Bilateral nonobstructing renal calculi. 6. Moderate coronary artery calcification. 7. Hypoattenuation of the cardiac blood pool in keeping with at least moderate anemia. Aortic Atherosclerosis (ICD10-I70.0). Electronically Signed   By: Helyn Numbers M.D.   On: 03/23/2023 20:54   CT HEAD WO CONTRAST ( )  Result Date: 03/23/2023 CLINICAL DATA:  Head trauma EXAM: CT HEAD WITHOUT CONTRAST CT CERVICAL SPINE WITHOUT CONTRAST TECHNIQUE: Multidetector CT imaging of the head and cervical spine was performed following the standard protocol without intravenous contrast. Multiplanar CT image reconstructions of the cervical spine were also generated. RADIATION DOSE REDUCTION: This exam was performed according to the departmental dose-optimization program which includes automated exposure control, adjustment of the mA and/or kV according to patient size and/or use of iterative reconstruction technique. COMPARISON:  CT brain 05/12/2021 FINDINGS: CT HEAD FINDINGS Brain: No acute territorial infarction, hemorrhage, or intracranial mass. Mild chronic small vessel ischemic changes of the white matter. Mild atrophy. Stable ventricle size. Vascular: No hyperdense vessels.  No unexpected  calcification Skull: Normal. Negative for fracture or focal lesion. Sinuses/Orbits: No acute finding. Other: None CT CERVICAL SPINE FINDINGS Alignment: Trace anterolisthesis C7 on T1. Facet alignment is maintained Skull base and vertebrae: No acute fracture. No primary bone lesion or focal pathologic process. Soft tissues and spinal canal: No prevertebral fluid or swelling. No visible canal hematoma. Disc levels: Moderate disc space narrowing C5-C6, C6-C7 and C7-T1. Facet degenerative changes. Bilateral foraminal narrowing C5-C6 and C6-C7 Upper chest: Negative. Other: None IMPRESSION: No CT evidence for acute intracranial abnormality. Atrophy and chronic small vessel ischemic changes of the white matter. Degenerative changes of the cervical spine. No acute osseous abnormality. Electronically Signed   By: Jasmine Pang M.D.   On: 03/23/2023 20:49   CT Cervical Spine Wo Contrast  Result Date: 03/23/2023 CLINICAL DATA:  Head trauma EXAM: CT HEAD WITHOUT CONTRAST CT CERVICAL SPINE WITHOUT CONTRAST TECHNIQUE: Multidetector CT imaging of the head and cervical spine was performed following the standard protocol without intravenous contrast. Multiplanar CT image reconstructions of the cervical spine were also generated. RADIATION DOSE REDUCTION: This exam was performed according to the departmental dose-optimization program which includes automated exposure control, adjustment of the mA and/or kV according to patient size and/or use of iterative reconstruction technique. COMPARISON:  CT brain 05/12/2021 FINDINGS: CT HEAD FINDINGS Brain: No acute territorial infarction, hemorrhage, or intracranial mass. Mild chronic small vessel ischemic changes of the white matter. Mild atrophy. Stable ventricle size. Vascular: No hyperdense vessels.  No unexpected calcification Skull: Normal. Negative for fracture or focal lesion. Sinuses/Orbits: No acute finding. Other: None CT CERVICAL SPINE FINDINGS Alignment: Trace anterolisthesis  C7 on T1. Facet alignment is maintained Skull base and vertebrae: No acute fracture. No primary bone lesion or focal pathologic process. Soft tissues and spinal canal: No prevertebral fluid or swelling. No visible canal hematoma. Disc levels: Moderate disc space narrowing C5-C6, C6-C7 and C7-T1. Facet degenerative changes. Bilateral foraminal narrowing C5-C6 and C6-C7 Upper chest: Negative.  Other: None IMPRESSION: No CT evidence for acute intracranial abnormality. Atrophy and chronic small vessel ischemic changes of the white matter. Degenerative changes of the cervical spine. No acute osseous abnormality. Electronically Signed   By: Jasmine Pang M.D.   On: 03/23/2023 20:49      LOS: 2 days    Jacquelin Hawking, MD [facility] 03/25/2023, 12:22 PM   If 7PM-7AM, please contact night-coverage [email or web address]

## 2023-03-25 NOTE — Anesthesia Preprocedure Evaluation (Addendum)
Anesthesia Evaluation  Patient identified by MRN, date of birth, ID band Patient awake    Reviewed: Allergy & Precautions, NPO status , Patient's Chart, lab work & pertinent test results  Airway Mallampati: I  TM Distance: >3 FB Neck ROM: Full    Dental  (+) Teeth Intact, Dental Advisory Given   Pulmonary former smoker   breath sounds clear to auscultation       Cardiovascular hypertension, Pt. on home beta blockers and Pt. on medications  Rhythm:Regular Rate:Normal     Neuro/Psych  PSYCHIATRIC DISORDERS Anxiety Depression     Neuromuscular disease    GI/Hepatic ,GERD  Medicated,,(+) Cirrhosis         Endo/Other  diabetes    Renal/GU Renal disease     Musculoskeletal negative musculoskeletal ROS (+)    Abdominal   Peds  Hematology   Anesthesia Other Findings   Reproductive/Obstetrics                             Anesthesia Physical Anesthesia Plan  ASA: 3  Anesthesia Plan: MAC   Post-op Pain Management: Tylenol PO (pre-op)*   Induction: Intravenous  PONV Risk Score and Plan: 0 and Propofol infusion  Airway Management Planned: Natural Airway  Additional Equipment: None  Intra-op Plan:   Post-operative Plan:   Informed Consent: I have reviewed the patients History and Physical, chart, labs and discussed the procedure including the risks, benefits and alternatives for the proposed anesthesia with the patient or authorized representative who has indicated his/her understanding and acceptance.       Plan Discussed with: CRNA  Anesthesia Plan Comments:        Anesthesia Quick Evaluation

## 2023-03-25 NOTE — Interval H&P Note (Signed)
History and Physical Interval Note: 56/male with cirrhosis, portal hypertension, right hemicolectomy and ileostomy for colon cancer with blood in ileostomy bad for EGD with possible banding and ileoscopy with propofol.  03/25/2023 12:12 PM  Martin Anthony  has presented today for EGD with possible banding and ileoscopy with propofol, with the diagnosis of Blood in ileostomy, cirrhosis, history of colon cancer, s/p resection.  The various methods of treatment have been discussed with the patient and family. After consideration of risks, benefits and other options for treatment, the patient has consented to  Procedure(s): ESOPHAGOGASTRODUODENOSCOPY (EGD) WITH PROPOFOL (N/A) ILEOSCOPY THROUGH STOMA (N/A) as a surgical intervention.  The patient's history has been reviewed, patient examined, no change in status, stable for surgery.  I have reviewed the patient's chart and labs.  Questions were answered to the patient's satisfaction.     Kerin Salen

## 2023-03-25 NOTE — TOC Initial Note (Signed)
Transition of Care [facility]) - Initial/Assessment Note    Patient Details  Name: Martin Anthony MRN: [id number] Date of Birth: [birth year]  Transition of Care [facility]) CM/SW Contact:    Larrie Kass, LCSW Phone Number: 03/25/2023, 1:58 PM  Clinical Narrative:                 CSW met with pt at bedside to discuss SDOH and substance use resources. Pt has declined substance resources. Pt reports being able to handle his drinking, and only do it in a social setting. CSW discussed SDOH's food insecurities, pt reports receiving food stamps that he shares with his daughter. CSW provided pt with a list of food pantries in the [place] area. Pt states he uses a cane at baseline and does not need any DME. Pt reported he will need transportation assistance at the time of d/c.    Expected Discharge Plan: Home/Self Care Barriers to Discharge: Continued Medical Work up   Patient Goals and CMS Choice Patient states their goals for this hospitalization and ongoing recovery are:: retrun home          Expected Discharge Plan and Services       Living arrangements for the past 2 months: Single Family Home                                      Prior Living Arrangements/Services Living arrangements for the past 2 months: Single Family Home Lives with:: Self, Adult Children Patient language and need for interpreter reviewed:: Yes Do you feel safe going back to the place where you live?: Yes      Need for Family Participation in Patient Care: No (Comment) Care giver support system in place?: No (comment) Current home services: DME Criminal Activity/Legal Involvement Pertinent to Current Situation/Hospitalization: No - Comment as needed  Activities of Daily Living   ADL Screening (condition at time of admission) Independently performs ADLs?: No Does the patient have a NEW difficulty with bathing/dressing/toileting/self-feeding that is expected to last >3 days?: No Does the patient  have a NEW difficulty with getting in/out of bed, walking, or climbing stairs that is expected to last >3 days?: No Does the patient have a NEW difficulty with communication that is expected to last >3 days?: No Is the patient deaf or have difficulty hearing?: No Does the patient have difficulty seeing, even when wearing glasses/contacts?: No Does the patient have difficulty concentrating, remembering, or making decisions?: No  Permission Sought/Granted                  Emotional Assessment Appearance:: Appears stated age Attitude/Demeanor/Rapport: Gracious, Engaged Affect (typically observed): Accepting Orientation: : Oriented to Self, Oriented to Place, Oriented to  Time, Oriented to Situation Alcohol / Substance Use: Alcohol Use Psych Involvement: No (comment)  Admission diagnosis:  Acute GI bleeding [K92.2] Gastrointestinal hemorrhage associated with gastroduodenitis [K29.91] Patient Active Problem List   Diagnosis Date Noted   Acute GI bleeding 03/23/2023   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   Cirrhosis with alcoholism (HCC) 12/05/2021   Type 2 diabetes mellitus with neurological complications (HCC) 12/05/2021   Primary adenocarcinoma of ascending colon (HCC) 12/05/2021   CKD (chronic kidney disease) stage 3, GFR 30-59 ml/min (HCC) 12/05/2021   Pancytopenia (HCC) 12/05/2021   Right inguinal hernia 12/05/2021   AKI (acute kidney injury) (HCC) 05/12/2021   Rhabdomyolysis 05/12/2021   Elevated LFTs 05/12/2021  Acute encephalopathy 05/12/2021   Ascites    Anemia 01/18/2019   Alcohol abuse 06/25/2015   Gout 10/03/2013   HYPERLIPIDEMIA 05/20/2007   Depression with anxiety 05/20/2007   Essential hypertension 05/20/2007   GERD 05/20/2007   PCP:  Nelwyn Salisbury, MD Pharmacy:   [facility]) ELECTRONIC - [place], [place] - [address] [place] [address] Phone: [phone] Fax: [phone]  [id number] [place], [place] [address] [place] [address] Phone: [phone] Fax: [phone]  [facility] [address] [place] [address] [place] [address] Phone: [phone] Fax: [phone]     Social Determinants of Health (SDOH) Social History: SDOH Screenings   Food Insecurity: Food Insecurity Present (03/23/2023)  Housing: Low Risk  (03/23/2023)  Transportation Needs: No Transportation Needs (03/23/2023)  Recent Concern: Transportation Needs - Unmet Transportation Needs (03/23/2023)  Utilities: Not At Risk (03/23/2023)  Alcohol Screen: High Risk (12/05/2021)  Depression (PHQ2-9): High Risk (02/02/2023)  Financial Resource Strain: Low Risk  (10/06/2022)   Received from [facility], [facility], [facility], [facility]  Physical Activity: Unknown (12/05/2021)  Social Connections: Unknown (08/11/2022)   Received from [facility], [facility]  Stress: Stress Concern Present (12/05/2021)  Tobacco Use: Medium Risk (03/25/2023)  Health Literacy: Adequate Health Literacy (11/26/2021)   Received from [facility] [place], [facility]  Recent Concern: Health Literacy - Inadequate Health Literacy (11/01/2021)   Received from [facility] [place]   SDOH Interventions:     Readmission Risk Interventions     No data to display

## 2023-03-25 NOTE — Anesthesia Postprocedure Evaluation (Signed)
Anesthesia Post Note  Patient: Martin Anthony  Procedure(s) Performed: ESOPHAGOGASTRODUODENOSCOPY (EGD) WITH PROPOFOL ESOPHAGEAL BANDING ILEOSCOPY THROUGH STOMA     Patient location during evaluation: PACU Anesthesia Type: MAC Level of consciousness: awake and alert Pain management: pain level controlled Vital Signs Assessment: post-procedure vital signs reviewed and stable Respiratory status: spontaneous breathing, nonlabored ventilation, respiratory function stable and patient connected to nasal cannula oxygen Cardiovascular status: stable and blood pressure returned to baseline Postop Assessment: no apparent nausea or vomiting Anesthetic complications: no  No notable events documented.  Last Vitals:  Vitals:   03/25/23 1321 03/25/23 1330  BP: 132/68 138/76  Pulse: 86 89  Resp: 19 17  Temp: 36.8 C   SpO2: 96% 100%    Last Pain:  Vitals:   03/25/23 1330  TempSrc:   PainSc: Asleep                 Shelton Silvas

## 2023-03-26 ENCOUNTER — Inpatient Hospital Stay (HOSPITAL_COMMUNITY): Payer: Medicaid Other

## 2023-03-26 ENCOUNTER — Encounter (HOSPITAL_COMMUNITY): Payer: Self-pay | Admitting: Family Medicine

## 2023-03-26 DIAGNOSIS — K7011 Alcoholic hepatitis with ascites: Secondary | ICD-10-CM

## 2023-03-26 DIAGNOSIS — K7030 Alcoholic cirrhosis of liver without ascites: Secondary | ICD-10-CM | POA: Diagnosis not present

## 2023-03-26 DIAGNOSIS — K922 Gastrointestinal hemorrhage, unspecified: Secondary | ICD-10-CM | POA: Diagnosis not present

## 2023-03-26 DIAGNOSIS — N179 Acute kidney failure, unspecified: Secondary | ICD-10-CM | POA: Diagnosis not present

## 2023-03-26 DIAGNOSIS — F10931 Alcohol use, unspecified with withdrawal delirium: Secondary | ICD-10-CM | POA: Diagnosis not present

## 2023-03-26 LAB — COMPREHENSIVE METABOLIC PANEL
ALT: 24 U/L (ref 0–44)
ALT: 23 U/L (ref 0–44)
AST: 79 U/L — ABNORMAL HIGH (ref 15–41)
AST: 73 U/L — ABNORMAL HIGH (ref 15–41)
Albumin: 2.1 g/dL — ABNORMAL LOW (ref 3.5–5.0)
Albumin: 2 g/dL — ABNORMAL LOW (ref 3.5–5.0)
Alkaline Phosphatase: 82 U/L (ref 38–126)
Alkaline Phosphatase: 80 U/L (ref 38–126)
Anion gap: 8 (ref 5–15)
Anion gap: 8 (ref 5–15)
BUN: 38 mg/dL — ABNORMAL HIGH (ref 6–20)
BUN: 39 mg/dL — ABNORMAL HIGH (ref 6–20)
CO2: 16 mmol/L — ABNORMAL LOW (ref 22–32)
CO2: 16 mmol/L — ABNORMAL LOW (ref 22–32)
Calcium: 6.7 mg/dL — ABNORMAL LOW (ref 8.9–10.3)
Calcium: 6.5 mg/dL — ABNORMAL LOW (ref 8.9–10.3)
Chloride: 108 mmol/L (ref 98–111)
Chloride: 107 mmol/L (ref 98–111)
Creatinine, Ser: 1.63 mg/dL — ABNORMAL HIGH (ref 0.61–1.24)
Creatinine, Ser: 2.56 mg/dL — ABNORMAL HIGH (ref 0.61–1.24)
GFR, Estimated: 49 mL/min — ABNORMAL LOW (ref >60)
GFR, Estimated: 29 mL/min — ABNORMAL LOW (ref >60)
Glucose, Bld: 137 mg/dL — ABNORMAL HIGH (ref 70–99)
Glucose, Bld: 198 mg/dL — ABNORMAL HIGH (ref 70–99)
Potassium: 4.7 mmol/L (ref 3.5–5.1)
Potassium: 4.4 mmol/L (ref 3.5–5.1)
Sodium: 132 mmol/L — ABNORMAL LOW (ref 135–145)
Sodium: 131 mmol/L — ABNORMAL LOW (ref 135–145)
Total Bilirubin: 8.8 mg/dL — ABNORMAL HIGH (ref 0.3–1.2)
Total Bilirubin: 10.3 mg/dL — ABNORMAL HIGH (ref 0.3–1.2)
Total Protein: 4.9 g/dL — ABNORMAL LOW (ref 6.5–8.1)
Total Protein: 4.8 g/dL — ABNORMAL LOW (ref 6.5–8.1)

## 2023-03-26 LAB — PREPARE RBC (CROSSMATCH)

## 2023-03-26 LAB — CBC
HCT: 20.3 % — ABNORMAL LOW (ref 39.0–52.0)
HCT: 13 % — ABNORMAL LOW (ref 39.0–52.0)
Hemoglobin: 6.4 g/dL — CL (ref 13.0–17.0)
Hemoglobin: 4.2 g/dL — CL (ref 13.0–17.0)
MCH: 34.8 pg — ABNORMAL HIGH (ref 26.0–34.0)
MCH: 34.4 pg — ABNORMAL HIGH (ref 26.0–34.0)
MCHC: 31.5 g/dL (ref 30.0–36.0)
MCHC: 32.3 g/dL (ref 30.0–36.0)
MCV: 110.3 fL — ABNORMAL HIGH (ref 80.0–100.0)
MCV: 106.6 fL — ABNORMAL HIGH (ref 80.0–100.0)
Platelets: 79 10*3/uL — ABNORMAL LOW (ref 150–400)
Platelets: 106 10*3/uL — ABNORMAL LOW (ref 150–400)
RBC: 1.84 MIL/uL — ABNORMAL LOW (ref 4.22–5.81)
RBC: 1.22 MIL/uL — ABNORMAL LOW (ref 4.22–5.81)
RDW: 16 % — ABNORMAL HIGH (ref 11.5–15.5)
RDW: 19.9 % — ABNORMAL HIGH (ref 11.5–15.5)
WBC: 5.6 10*3/uL (ref 4.0–10.5)
WBC: 8.5 10*3/uL (ref 4.0–10.5)
nRBC: 0 % (ref 0.0–0.2)
nRBC: 0 % (ref 0.0–0.2)

## 2023-03-26 LAB — PROTIME-INR
INR: 1.8 — ABNORMAL HIGH (ref 0.8–1.2)
Prothrombin Time: 21.3 s — ABNORMAL HIGH (ref 11.4–15.2)

## 2023-03-26 LAB — HEMOGLOBIN AND HEMATOCRIT, BLOOD
HCT: 21.2 % — ABNORMAL LOW (ref 39.0–52.0)
HCT: 23.5 % — ABNORMAL LOW (ref 39.0–52.0)
HCT: 27.4 % — ABNORMAL LOW (ref 39.0–52.0)
Hemoglobin: 7 g/dL — ABNORMAL LOW (ref 13.0–17.0)
Hemoglobin: 7.7 g/dL — ABNORMAL LOW (ref 13.0–17.0)
Hemoglobin: 8.8 g/dL — ABNORMAL LOW (ref 13.0–17.0)

## 2023-03-26 LAB — LACTIC ACID, PLASMA: Lactic Acid, Venous: 1.2 mmol/L (ref 0.5–1.9)

## 2023-03-26 LAB — APTT: aPTT: 33 s (ref 24–36)

## 2023-03-26 LAB — AMMONIA: Ammonia: 20 umol/L (ref 9–35)

## 2023-03-26 LAB — MRSA NEXT GEN BY PCR, NASAL: MRSA by PCR Next Gen: NOT DETECTED

## 2023-03-26 MED ORDER — SODIUM CHLORIDE 0.45 % IV SOLN: INTRAVENOUS | Status: DC

## 2023-03-26 MED ORDER — IOHEXOL 350 MG/ML SOLN
80.0000 mL | Freq: Once | INTRAVENOUS | Status: AC | PRN
  Administered 2023-03-26: 80 mL via INTRAVENOUS

## 2023-03-26 MED ORDER — INFLUENZA VIRUS VACC SPLIT PF (FLUZONE) 0.5 ML IM SUSY: 0.5000 mL | PREFILLED_SYRINGE | INTRAMUSCULAR | Status: DC | PRN

## 2023-03-26 MED ORDER — LORAZEPAM 2 MG/ML IJ SOLN: 0.0000 mg | INTRAMUSCULAR | Status: DC

## 2023-03-26 MED ORDER — SODIUM CHLORIDE 0.9 % IV SOLN: 250.0000 mL | INTRAVENOUS | Status: DC

## 2023-03-26 MED ORDER — SODIUM CHLORIDE (PF) 0.9 % IJ SOLN
INTRAMUSCULAR | Status: AC
  Filled 2023-03-26: qty 50

## 2023-03-26 MED ORDER — SODIUM CHLORIDE 0.9% IV SOLUTION: Freq: Once | INTRAVENOUS | Status: DC

## 2023-03-26 MED ORDER — ALBUMIN HUMAN 25 % IV SOLN
12.5000 g | Freq: Once | INTRAVENOUS | Status: AC
  Administered 2023-03-26: 12.5 g via INTRAVENOUS
  Filled 2023-03-26: qty 50

## 2023-03-26 MED ORDER — SODIUM CHLORIDE 0.9% IV SOLUTION: Freq: Once | INTRAVENOUS | Status: AC

## 2023-03-26 MED ORDER — THIAMINE HCL 100 MG/ML IJ SOLN
100.0000 mg | Freq: Every day | INTRAMUSCULAR | Status: AC
  Administered 2023-03-27 – 2023-03-29 (×3): 100 mg via INTRAVENOUS
  Filled 2023-03-26 (×3): qty 2

## 2023-03-26 MED ORDER — DEXMEDETOMIDINE HCL IN NACL 200 MCG/50ML IV SOLN
0.0000 ug/kg/h | INTRAVENOUS | Status: DC
  Administered 2023-03-26: 0.6 ug/kg/h via INTRAVENOUS
  Administered 2023-03-26: 0.4 ug/kg/h via INTRAVENOUS
  Administered 2023-03-26: 0.5 ug/kg/h via INTRAVENOUS
  Administered 2023-03-26: 0.6 ug/kg/h via INTRAVENOUS
  Administered 2023-03-27 (×2): 0.4 ug/kg/h via INTRAVENOUS
  Administered 2023-03-27: 0.8 ug/kg/h via INTRAVENOUS
  Administered 2023-03-27: 0.4 ug/kg/h via INTRAVENOUS
  Administered 2023-03-27: 0.8 ug/kg/h via INTRAVENOUS
  Administered 2023-03-28: 0.6 ug/kg/h via INTRAVENOUS
  Administered 2023-03-28: 0.4 ug/kg/h via INTRAVENOUS
  Administered 2023-03-28: 0.7 ug/kg/h via INTRAVENOUS
  Administered 2023-03-28: 0.5 ug/kg/h via INTRAVENOUS
  Filled 2023-03-26 (×8): qty 50
  Filled 2023-03-26: qty 100
  Filled 2023-03-26 (×3): qty 50

## 2023-03-26 MED ORDER — NICOTINE 7 MG/24HR TD PT24
7.0000 mg | MEDICATED_PATCH | Freq: Every day | TRANSDERMAL | Status: DC
  Administered 2023-03-26 – 2023-04-08 (×13): 7 mg via TRANSDERMAL
  Filled 2023-03-26 (×15): qty 1

## 2023-03-26 MED ORDER — NOREPINEPHRINE 4 MG/250ML-% IV SOLN
2.0000 ug/min | INTRAVENOUS | Status: DC
  Administered 2023-03-26: 2 ug/min via INTRAVENOUS
  Administered 2023-03-27: 4 ug/min via INTRAVENOUS
  Administered 2023-03-28: 3 ug/min via INTRAVENOUS
  Administered 2023-03-29: 2 ug/min via INTRAVENOUS
  Filled 2023-03-26 (×5): qty 250

## 2023-03-26 MED ORDER — PANTOPRAZOLE SODIUM 40 MG IV SOLR
40.0000 mg | Freq: Two times a day (BID) | INTRAVENOUS | Status: DC
  Administered 2023-03-26 – 2023-04-02 (×15): 40 mg via INTRAVENOUS
  Filled 2023-03-26 (×16): qty 10

## 2023-03-26 MED ORDER — FOLIC ACID 5 MG/ML IJ SOLN
1.0000 mg | Freq: Every day | INTRAMUSCULAR | Status: AC
  Administered 2023-03-26 – 2023-03-28 (×3): 1 mg via INTRAVENOUS
  Filled 2023-03-26 (×3): qty 0.2

## 2023-03-26 MED ORDER — SODIUM CHLORIDE 0.9 % IV SOLN: 1.0000 mg | Freq: Every day | INTRAVENOUS | Status: DC

## 2023-03-26 MED ORDER — LORAZEPAM 2 MG/ML IJ SOLN: 0.0000 mg | Freq: Three times a day (TID) | INTRAMUSCULAR | Status: DC

## 2023-03-26 MED ORDER — CHLORHEXIDINE GLUCONATE CLOTH 2 % EX PADS
6.0000 | MEDICATED_PAD | Freq: Every day | CUTANEOUS | Status: DC
  Administered 2023-03-27: 6 via TOPICAL

## 2023-03-26 MED ORDER — ORAL CARE MOUTH RINSE: 15.0000 mL | OROMUCOSAL | Status: DC | PRN

## 2023-03-26 NOTE — Progress Notes (Addendum)
TRH CROSS COVER NOTE  Patient Name: Martin Anthony   MRN: [id number]   Date of Birth/ Sex: [birth year] , male      Admission Date: 03/23/2023  Attending Provider: Narda Bonds, MD  Primary Diagnosis: Acute GI bleeding     BRIEF HPI:  Martin Anthony is a 57 y.o. male with a history of alcoholism, cirrhosis, CKD stage II, portal hypertension, esophageal varices, colon cancer status post hemicolectomy admitted to med surg unit with Acute GI Bleed.  Significant Hospital Events   10/08: Admitted to medsurg unit with Acute GI bleed s/p 1 unit PRBC. CT showed advanced cirrhosis w/ portal HTN and varices. 10/09: GI consulted, pt started on octreotide gtt, protonix with plan for EGD w/possible banding of varices and ileoscopy in the am. 10/10:S/P EGD with Esophageal banding Ileoscopy through stoma. Overnight rapid response called for significant bleeding through the stoma, vs stable, Bolus and labs obtained.BP improved 10/11: Patient became hemodynamically unstable with significant drop in BP and hgb . Transferred to ICU for possible vasopressors.  Significant Diagnostic Tests:  10/11: CT abdomen and pelvis>pending  Micro Data:  10/11: Blood culture x2> 1011: MRSA PCR>>   OBJECTIVE  Blood pressure (!) 107/39, pulse 72, temperature 99.1 F (37.3 C), temperature source Oral, resp. rate (!) 24, height 6\' 4"  (1.93 m), weight 81.6 kg, SpO2 100%.      Labs   CBC: Recent Labs  Lab 03/23/23 1930 03/23/23 1946 03/24/23 0634 03/24/23 0929 03/24/23 1202 03/24/23 1912 03/25/23 0014 03/25/23 2252  WBC 4.2   < > 2.4*  --  3.2* 3.7* 3.1* 7.0  NEUTROABS 3.4  --   --   --   --   --   --   --   HGB 7.8*   < > 6.6* 6.8* 7.3* 8.4* 8.1* 7.5*  HCT 23.8*   < > 20.6* 21.0* 22.1* 26.5* 26.0* 23.7*  MCV 108.7*   < > 106.7*  --  107.3* 109.1* 112.6* 111.3*  PLT 89*   < > 62*  --  77* 80* 80* 106*   < > = values in this interval not displayed.    Basic Metabolic Panel: Recent Labs  Lab 03/23/23 1930  03/23/23 1946 03/24/23 0055 03/25/23 0014 03/25/23 2252 03/26/23 0321  NA 136 141 137 136 133* 132*  K 4.4 4.8 4.3 4.8 4.5 4.7  CL 109 111 111 112* 110 108  CO2 18*  --  20* 18* 18* 16*  GLUCOSE 163* 158* 152* 163* 148* 137*  BUN 22* 19 23* 34* 37* 38*  CREATININE 1.75* 2.00* 1.67* 2.42* 2.50* 1.63*  CALCIUM 8.3*  --  7.7* 7.2* 6.8* 6.7*  MG  --   --  1.8  --  1.7  --   PHOS  --   --  3.1  --   --   --    GFR: Estimated Creatinine Clearance: 59.1 mL/min (A) (by C-G formula based on SCr of 1.63 mg/dL (H)). Recent Labs  Lab 03/24/23 1202 03/24/23 1912 03/25/23 0014 03/25/23 2252  WBC 3.2* 3.7* 3.1* 7.0    Liver Function Tests: Recent Labs  Lab 03/23/23 1930 03/24/23 0055 03/25/23 0014 03/26/23 0321  AST 97* 83* 70* 79*  ALT 30 27 23 24   ALKPHOS 180* 146* 111 82  BILITOT 5.4* 5.7* 6.0* 8.8*  PROT 6.1* 5.6* 5.3* 4.9*  ALBUMIN 2.4* 2.3* 2.2* 2.1*   No results for input(s): "LIPASE", "AMYLASE" in the last 168 hours. No results for  input(s): "AMMONIA" in the last 168 hours.  ABG    Component Value Date/Time   PHART 7.417 05/12/2021 0608   PCO2ART 20.5 (L) 05/12/2021 0608   PO2ART 127 (H) 05/12/2021 0608   HCO3 12.9 (L) 05/12/2021 0608   TCO2 17 (L) 03/23/2023 1946   ACIDBASEDEF 9.6 (H) 05/12/2021 0608   O2SAT 97.9 05/12/2021 0608     Coagulation Profile: Recent Labs  Lab 03/23/23 1930  INR 1.6*    ASSESSMENT & PLAN    #Acute GI Bleed  #Acute Blood Loss Anemia Due to above #Hemorrhagic Shock #Thrombocytopenia PMHx:Alcohol  Cirrhosis,  esophageal varices, portal htn S/p EGD 10/10: Grade III esophageal varices  s/p banding, Portal hypertensive gastropathy.Bleeding in ileostomy s/p ileoscopy through stoma -Concerns for varices bleeding -Octreotide gtt -Protonix 40 mg IV BID -GI following, appreciate input -Consider Empiric Ceftriaxone 1g for SBP prophylaxis -Monitor for S/Sx of bleeding -Trend CBC (H&H q6h) -Pressors for MAP goal>65 -SCD's for VTE  Prophylaxis (chemical ppx contraindicated) -Transfuse for Hgb <7  (has received 2 units pRBC's far ) -Discussed with GI on call Dr. Bosie Clos who recommends obtaining CT as above for further evalaution   Cirrhosis 2/2 Alcohol  SBP?  -Transferred to ICU -MELD 3.0: 30 at 03/25/2023 10:52 PM -MELD-Na: 29 at 03/25/2023 10:52 PM -Obtain blood cultures -Check Ammonia levels -Consider CTX for empiric SBP coverage. -Monitor fever curve, trend CBC, CMP, Lactate, procal -f/u CT Abd/pelvis -Precedex drip for agitation if fails Benzos -multivitamin, thiamine, and folic acid   Acute on Chronic Kidney Disease  Cardiorenal Syndrome?  NAGMA Hyponatremia-Improving Suspect ATN. Plan to give NS and albumin to replete intravascular volume. S/p 1000cc bolus NS this AM.  -Check Lactate, expect will be elevated in the setting of cirrhosis -Check ABG/VBG -Consult Nephrology if appropriate post IV contrast -Ensure adequate renal perfusion -Avoid nephrotoxic agents as able -Replace electrolytes as indicated -Albumin x1 dose administered -Strict I/Os -Trend CMP, Mg, Phos   At Risk for Glucose impairment -CBG's q4; while NPO -Target range of 140 to 180, avoid hypoglycemia -SSI -Follow ICU Hypo/Hyperglycemia protocol           Webb Silversmith, BSN,MSN, DNP, CCRN, FNP-C, AGACNP-BC Acute Care & Family Nurse Practitioner  [facility]  See Amion for personal pager PCCM on call pager [phone] until 7 am

## 2023-03-26 NOTE — Progress Notes (Addendum)
Patient transferred to the ICU for hypotension in the setting of suspected hemorrhagic shock from acute GI bleed (fresh blood in ileostomy bag) s/p EGD 10/10 showing Grade lll varices s/p banding and severe diffuse portal hypertensive gastropathy in the stomach noted easy oozing and mucosal friability. There is a significant drop in hemoglobin from 8.1~7.5~ to 6.4 this am. There is concerned for continued active bleeding. CT has been ordered since this morning unfortunately per nursing staff  there is only 1 CT tech for the whole hospital and 6 pending CTs in the ED which is prioritized. Per nursing staff there is currently no availability or capability of obtaining imaging but will get imaging done ASAP. We will continue resuscitative efforts with blood product, IVF, and pressors if needed. He is hemodynamically stable at the moment and protecting his airway. Discusses case with GI on call Dr. Bosie Clos and IR who recommends obtaining CT as ordered for further evaluation. See detailed cross cover progress notes   Webb Silversmith, DNP, CCRN, FNP-C, AGACNP-BC Acute Care & Family Nurse Practitioner  [facility]  See Amion for personal pager PCCM on call pager [phone] until 7 am

## 2023-03-26 NOTE — Consult Note (Signed)
NAME:  Martin Anthony, MRN:  [id number], DOB:  05-Mar-1966, LOS: 3 ADMISSION DATE:  03/23/2023, CONSULTATION DATE:  03/26/23 REFERRING MD:  Dr Mal Misty, CHIEF COMPLAINT:  agitated delirium, anemia   History of Present Illness:  57 year old man with history of alcoholism and alcoholic cirrhosis with portal hypertension and varices, CKD stage II, colon cancer status post hemicolectomy.  He was admitted 10/8 with abdominal pain and blood in his ileostomy.  Found to have acute anemia requiring transfusion.  He underwent EGD 10/10 with grade 3 esophageal varices that were banded, portal hypertensive gastropathy.  Continued on octreotide and IV Protonix.  He had recurrent GI bleeding postprocedure and a CT angio did not show any clear source.  He drinks about 1 pint of alcohol daily, began to show evidence for agitated delirium on 10/10 with increasing CIWA scores.  A Precedex infusion was started early 10/11.  Course further complicated by acute on chronic renal failure, SCr 2.56  Hgb 7.5 > 6.4 > 4.2 (question spurious) > 7.0.  PRBC ordered this morning after the 7.0 obtained.  INR 1.8  Pertinent  Medical History   Past Medical History:  Diagnosis Date   Anxiety    Ascites due to alcoholic cirrhosis (HCC)    Cirrhosis with alcoholism (HCC)    Depression    Diabetes mellitus without complication (HCC)    GERD (gastroesophageal reflux disease)    Gout    Hyperlipidemia    Hypertension    Primary adenocarcinoma of ascending colon (HCC)    Substance abuse (HCC)    alcoholism    Type 2 diabetes mellitus with diabetic neuropathy (HCC)     Significant Hospital Events: Including procedures, antibiotic start and stop dates in addition to other pertinent events     Interim History / Subjective:  Currently on Precedex 0.5, deeply sedated although he had agitated delirium this morning when he transition to the ICU.  Objective   Blood pressure (!) 105/53, pulse 61, temperature 98.1 F (36.7 C),  temperature source Axillary, resp. rate (!) 23, height 6\' 4"  (1.93 m), weight 85 kg, SpO2 99%.        Intake/Output Summary (Last 24 hours) at 03/26/2023 1318 Last data filed at 03/26/2023 1300 Gross per 24 hour  Intake 2030.05 ml  Output 1250 ml  Net 780.05 ml   Filed Weights   03/25/23 0500 03/25/23 1145 03/26/23 0500  Weight: 86.3 kg 81.6 kg 85 kg    Examination: General: Ill-appearing man laying in bed, currently sedated HENT: Edentulous, oropharynx somewhat dry Lungs: Clear bilaterally Cardiovascular: Regular, no murmur Abdomen: Ileostomy pink with blood-tinged stool present.  Active bowel sounds.  Tender to palpation all quadrants Extremities: No edema Skin: Jaundiced Neuro: Sedated, does arouse with stimulation.  Attempts to speak but garbled and unable to understand.  Did not follow commands. GU: Pure wick in place  Resolved Hospital Problem list     Assessment & Plan:   Acute upper GI bleeding due to esophageal varices and portal gastropathy.  Underwent banding 10/10 with apparent eradication of the varices. -Follow hemoglobin, next check post PRBC 1 unit -Protonix IV -Octreotide -Appreciate GI input.  Question whether he will need a relook EGD if bleeding continues versus angiography (relatively contraindicated given his renal function)  Acute agitated delirium due to alcohol withdrawal -Continue Precedex infusion and wean as able -Transition to CIWA scoring and intermittent dosing as he stabilizes -Minimize any sedating medications if possible -Restore adequate perfusion pressures -On rifaximin.  Consider lactulose  depending on course  Shock.  Suspect in part hemorrhagic shock and contribution of sedating medications superimposed on vasoplegia from his chronic liver disease.  Lactic acid reassuring 1.2 -Working to address GI bleeding as above -Volume resuscitation and PRBC resuscitation as indicated -Will work to wean his Precedex as a potential contributor  to his hypotension -Wean norepinephrine as able, goal MAP 65  Acute on chronic stage II renal failure, likely ATN from hypoperfusion Non-anion gap metabolic acidosis -Follow urine output and BMP -Restore adequate perfusion pressures -Renal dose medications and avoid any nephrotoxins -Depending on CO2 trend could consider bicarbonate infusion  Cirrhosis with ascites, esophageal varices and portal hypertension.  Currently his nadolol, spironolactone, Lasix are all on hold due to his hemodynamics. -Continue to hold Aldactone, Lasix, nadolol, carvedilol.  Consider restart as he stabilizes -Discriminant factor 35.9, not currently on steroids.  AST mildly elevated -Follow INR, follow CMP  History colon cancer -On observation  Depression -Home Zoloft on hold   Best Practice (right click and "Reselect all SmartList Selections" daily)   Diet/type: NPO DVT prophylaxis: SCD GI prophylaxis: PPI Lines: N/A Foley:  N/A Code Status:  full code Last date of multidisciplinary goals of care discussion [pending]  Labs   CBC: Recent Labs  Lab 03/23/23 1930 03/23/23 1946 03/24/23 1912 03/25/23 0014 03/25/23 2252 03/26/23 0424 03/26/23 0947 03/26/23 1046  WBC 4.2   < > 3.7* 3.1* 7.0 5.6 8.5  --   NEUTROABS 3.4  --   --   --   --   --   --   --   HGB 7.8*   < > 8.4* 8.1* 7.5* 6.4* 4.2* 7.0*  HCT 23.8*   < > 26.5* 26.0* 23.7* 20.3* 13.0* 21.2*  MCV 108.7*   < > 109.1* 112.6* 111.3* 110.3* 106.6*  --   PLT 89*   < > 80* 80* 106* 79* 106*  --    < > = values in this interval not displayed.    Basic Metabolic Panel: Recent Labs  Lab 03/24/23 0055 03/25/23 0014 03/25/23 2252 03/26/23 0321 03/26/23 0947  NA 137 136 133* 132* 131*  K 4.3 4.8 4.5 4.7 4.4  CL 111 112* 110 108 107  CO2 20* 18* 18* 16* 16*  GLUCOSE 152* 163* 148* 137* 198*  BUN 23* 34* 37* 38* 39*  CREATININE 1.67* 2.42* 2.50* 1.63* 2.56*  CALCIUM 7.7* 7.2* 6.8* 6.7* 6.5*  MG 1.8  --  1.7  --   --   PHOS 3.1  --    --   --   --    GFR: Estimated Creatinine Clearance: 39.2 mL/min (A) (by C-G formula based on SCr of 2.56 mg/dL (H)). Recent Labs  Lab 03/25/23 0014 03/25/23 2252 03/26/23 0424 03/26/23 0947  WBC 3.1* 7.0 5.6 8.5  LATICACIDVEN  --   --   --  1.2    Liver Function Tests: Recent Labs  Lab 03/23/23 1930 03/24/23 0055 03/25/23 0014 03/26/23 0321 03/26/23 0947  AST 97* 83* 70* 79* 73*  ALT 30 27 23 24 23   ALKPHOS 180* 146* 111 82 80  BILITOT 5.4* 5.7* 6.0* 8.8* 10.3*  PROT 6.1* 5.6* 5.3* 4.9* 4.8*  ALBUMIN 2.4* 2.3* 2.2* 2.1* 2.0*   No results for input(s): "LIPASE", "AMYLASE" in the last 168 hours. Recent Labs  Lab 03/26/23 0947  AMMONIA 20    ABG    Component Value Date/Time   PHART 7.417 05/12/2021 0608   PCO2ART 20.5 (L) 05/12/2021 1610  PO2ART 127 (H) 05/12/2021 0608   HCO3 12.9 (L) 05/12/2021 0608   TCO2 17 (L) 03/23/2023 1946   ACIDBASEDEF 9.6 (H) 05/12/2021 0608   O2SAT 97.9 05/12/2021 0608     Coagulation Profile: Recent Labs  Lab 03/23/23 1930 03/26/23 0947  INR 1.6* 1.8*    Cardiac Enzymes: No results for input(s): "CKTOTAL", "CKMB", "CKMBINDEX", "TROPONINI" in the last 168 hours.  HbA1C: Hgb A1c MFr Bld  Date/Time Value Ref Range Status  05/12/2021 05:22 AM 5.8 (H) 4.8 - 5.6 % Final    Comment:    (NOTE)         Prediabetes: 5.7 - 6.4         Diabetes: >6.4         Glycemic control for adults with diabetes: <7.0   01/18/2019 11:00 AM 5.3 4.8 - 5.6 % Final    Comment:    (NOTE) Pre diabetes:          5.7%-6.4% Diabetes:              >6.4% Glycemic control for   <7.0% adults with diabetes     CBG: No results for input(s): "GLUCAP" in the last 168 hours.  Review of Systems:   Unable to obtain  Past Medical History:  He,  has a past medical history of Anxiety, Ascites due to alcoholic cirrhosis (HCC), Cirrhosis with alcoholism (HCC), Depression, Diabetes mellitus without complication (HCC), GERD (gastroesophageal reflux  disease), Gout, Hyperlipidemia, Hypertension, Primary adenocarcinoma of ascending colon (HCC), Substance abuse (HCC), and Type 2 diabetes mellitus with diabetic neuropathy (HCC).   Surgical History:   Past Surgical History:  Procedure Laterality Date   BIOPSY  01/19/2019   Procedure: BIOPSY;  Surgeon: Jeani Hawking, MD;  Location: [facility];  Service: Endoscopy;;   COLON SURGERY  10/25/2021   right hemicolectomy with ostomy at [facility] in [place]   COLONOSCOPY N/A 01/19/2019   Procedure: COLONOSCOPY;  Surgeon: Jeani Hawking, MD;  Location: [facility];  Service: Endoscopy;  Laterality: N/A;   ESOPHAGOGASTRODUODENOSCOPY (EGD) WITH PROPOFOL N/A 01/19/2019   Procedure: ESOPHAGOGASTRODUODENOSCOPY (EGD) WITH PROPOFOL;  Surgeon: Jeani Hawking, MD;  Location: [facility];  Service: Endoscopy;  Laterality: N/A;   HEMOSTASIS CLIP PLACEMENT  01/19/2019   Procedure: HEMOSTASIS CLIP PLACEMENT;  Surgeon: Jeani Hawking, MD;  Location: [facility];  Service: Endoscopy;;   HERNIA REPAIR     umbilical    INGUINAL HERNIA REPAIR Bilateral    IR FLUORO GUIDE CV LINE RIGHT  05/13/2021   IR PARACENTESIS  05/19/2021   IR US GUIDE VASC ACCESS RIGHT  05/13/2021   POLYPECTOMY  01/19/2019   Procedure: POLYPECTOMY;  Surgeon: Jeani Hawking, MD;  Location: [facility];  Service: Endoscopy;;     Social History:   reports that he quit smoking about 21 years ago. His smoking use included cigarettes. He has never used smokeless tobacco. He reports current alcohol use of about 2.0 standard drinks of alcohol per week. He reports that he does not use drugs.   Family History:  His family history includes Diabetes in his father and maternal grandfather; Heart attack in his father; Hypertension in his father and mother; Pneumonia in his father.   Allergies Allergies  Allergen Reactions   Acetaminophen     States he was told not to take it.    Ibuprofen Other (See Comments)    Liver sensitivity     Home  Medications  Prior to Admission medications   Medication Sig Start Date End Date  Taking? Authorizing Provider  folic acid (FOLVITE) 1 MG tablet Take 1 tablet (1 mg total) by mouth daily. 05/21/21  Yes Leroy Sea, MD  furosemide (LASIX) 40 MG tablet TAKE 1 TABLET BY MOUTH EVERY DAY 01/25/23  Yes Nelwyn Salisbury, MD  gabapentin (NEURONTIN) 300 MG capsule Take 1 capsule (300 mg total) by mouth 3 (three) times daily. 02/02/23  Yes Nelwyn Salisbury, MD  Iron, Ferrous Sulfate, 325 (65 Fe) MG TABS Take 325 mg by mouth daily. 12/18/21  Yes Nelwyn Salisbury, MD  MAGNESIUM PO Take 1 tablet by mouth daily.   Yes [provider]  nadolol (CORGARD) 20 MG tablet Take 1 tablet (20 mg total) by mouth daily. 12/05/21  Yes Nelwyn Salisbury, MD  pantoprazole (PROTONIX) 40 MG tablet Take 1 tablet (40 mg total) by mouth daily. 05/21/21  Yes Leroy Sea, MD  rifaximin (XIFAXAN) 550 MG TABS tablet Take 1 tablet (550 mg total) by mouth 2 (two) times daily. 01/20/22  Yes Nelwyn Salisbury, MD  sertraline (ZOLOFT) 50 MG tablet Take 1 tablet (50 mg total) by mouth daily. 02/02/23  Yes Nelwyn Salisbury, MD  spironolactone (ALDACTONE) 25 MG tablet TAKE 1 TABLET (25 MG TOTAL) BY MOUTH DAILY AT 12 NOON. 01/25/23  Yes Nelwyn Salisbury, MD  thiamine 100 MG tablet Take 1 tablet (100 mg total) by mouth daily. 05/21/21  Yes Leroy Sea, MD  carvedilol (COREG) 3.125 MG tablet Take 3.125 mg by mouth 2 (two) times daily. Patient not taking: Reported on 03/24/2023    [provider]  ketoconazole (NIZORAL) 2 % cream Apply 1 Application topically 2 (two) times daily. Patient not taking: Reported on 03/24/2023 03/12/23   Nelwyn Salisbury, MD  sodium bicarbonate 650 MG tablet Take 1 tablet (650 mg total) by mouth 2 (two) times daily. Patient not taking: Reported on 03/24/2023 05/21/21   Leroy Sea, MD     Critical care time: 45 min     Levy Pupa, MD, PhD 03/26/2023, 1:18 PM [facility] [phone] or if no answer before 7:00PM call [phone] For any issues after 7:00PM please call eLink [phone]

## 2023-03-26 NOTE — Plan of Care (Addendum)
  Problem: Education: Goal: Knowledge of General Education information will improve Description: Including pain rating scale, medication(s)/side effects and non-pharmacologic comfort measures Outcome: Not Progressing   Problem: Health Behavior/Discharge Planning: Goal: Ability to manage health-related needs will improve Outcome: Not Progressing   Problem: Clinical Measurements: Goal: Ability to maintain clinical measurements within normal limits will improve Outcome: Not Progressing Goal: Diagnostic test results will improve Outcome: Not Progressing Goal: Cardiovascular complication will be avoided Outcome: Not Progressing

## 2023-03-26 NOTE — Progress Notes (Signed)
PT Cancellation Note  Patient Details Name: Martin Anthony MRN: [id number] DOB: 1965-09-08   Cancelled Treatment:    Reason Eval/Treat Not Completed: Medical issues which prohibited therapy, to get blood, transferred to ICU.  Blanchard Kelch PT Acute Rehabilitation Services Office [phone] Weekend [phone]    Rada Hay 03/26/2023, 7:28 AM

## 2023-03-26 NOTE — Progress Notes (Signed)
PROGRESS NOTE    Martin Anthony  NGE:[id number] DOB: [birth year] DOA: 03/23/2023 PCP: Nelwyn Salisbury, MD   Brief Narrative: Martin Anthony is a 57 y.o. male with a history of alcoholism, cirrhosis, CKD stage II, portal hypertension, esophageal varices, colon cancer status post hemicolectomy.  Patient presented secondary to blood from ileostomy associated abdominal pain concerning for GI bleeding.  Patient found to have associated acute anemia requiring blood transfusion on admission.  [facility] gastroenterology was consulted for management.   Assessment and Plan:  Acute GI bleeding CTA negative for identification of source but was notable for inflammatory changes and wall-thickening  involving the second portion of the duodenum. Patient started empirically on Protonix IV. GI consulted. Patient has a history of esophageal varices and portal hypertension. Upper endoscopy performed on 10/10 significant for grade III esophogeal varices that were completely eradicated/banded in addition to portal hypertensive gastropathy and congested duodenal mucosa. Patient with recurrent GI bleeding overnight. CT angio GI bleed without identified source for bleeding. -GI recommendations: IV Protonix, Octreotide; pending today  Acute on chronic anemia Acute blood loss anemia Hemorrhagic shock Baseline hemoglobin of 9-11. Hemoglobin of 7.8 on admission. Patient has required 1 unit of PRBC to date. Hemoglobin down to 6.6 > 6.8 requiring a second unit of PRBC on 10/9. Hemoglobin trended down again secondary to recurrent bleeding leading to hypotension requiring ICU admission, IV fluid resuscitation and PRBC transfusion. -CBC in AM -Transfuse another unit of PRBC; post-transfusion hemoglobin  Cirrhosis with ascites Secondary to alcohol. MELD Na of 24 on admission; up to 33. Maddrey's discriminant function of 35.9. Lasix and spironolactone held secondary to acute GI bleeding. -May need to consider steroid therapy; will  defer to GI  Upper abdominal pain Seems to be somewhat pleuritic in nature vs worsened deep inspiration. Unclear etiology. Chest x-ray non-specific but cannot exclude pneumonia. No associated fevers, leukocytosis or cough.  Esophageal varies Portal hypertension -Hold nadolol  Hypbilirubinemia Related to underlying cirrhosis. Bilirubin worsening.   AKI on CKD 2 Baseline creatinine appears to be around 1.3. Creatinine of 1.75 with peak to 2. Creatinine initially down to 1.67 but has acutely worsened to 2.42 in setting of poor oral intake by patient. Improved again with further IV fluids. -IV fluids -BMP in AM  Alcohol abuse Alcohol withdrawal Patient started on CIWA. Worsening withdrawal symptoms requiring initiation of Precedex infusion. -Continue Precedex, wean off as able; PCCM consult -Continue CIWA  Colon cancer S/p hemicolectomy. Under observation.  DVT prophylaxis: SCDs Code Status:   Code Status: Full Code Family Communication: None at bedside Disposition Plan: Discharge home likely in 3-5 days pending stable hemoglobin, GI recommendations and improvement in AKI   Consultants:  [facility] PCCM  Procedures:  10/10: Upper endoscopy/Ileoscopy  Antimicrobials: None    Subjective: Patient without specific concerns; trying to remove mittens. Recurrent GI bleeding overnight and transferred to the ICU.  Objective: BP 134/64   Pulse 74   Temp 98.3 F (36.8 C) (Oral)   Resp 17   Ht 6\' 4"  (1.93 m)   Wt 85 kg   SpO2 100%   BMI 22.81 kg/m   Examination:  General exam: Appears calm and comfortable Respiratory system: Clear to auscultation. Respiratory effort normal. Cardiovascular system: S1 & S2 heard, RRR. Gastrointestinal system: Abdomen is soft and non-tender. Normal bowel sounds heard. Central nervous system: Alert and oriented to self and place.   Data Reviewed: I have personally reviewed following labs and imaging studies  CBC Lab  Results  Component Value Date   WBC 5.6 03/26/2023   RBC 1.84 (L) 03/26/2023   HGB 6.4 (LL) 03/26/2023   HCT 20.3 (L) 03/26/2023   MCV 110.3 (H) 03/26/2023   MCH 34.8 (H) 03/26/2023   PLT 79 (L) 03/26/2023   MCHC 31.5 03/26/2023   RDW 16.0 (H) 03/26/2023   LYMPHSABS 0.3 (L) 03/23/2023   MONOABS 0.4 03/23/2023   EOSABS 0.0 03/23/2023   BASOSABS 0.0 03/23/2023     Last metabolic panel Lab Results  Component Value Date   NA 132 (L) 03/26/2023   K 4.7 03/26/2023   CL 108 03/26/2023   CO2 16 (L) 03/26/2023   BUN 38 (H) 03/26/2023   CREATININE 1.63 (H) 03/26/2023   GLUCOSE 137 (H) 03/26/2023   GFRNONAA 49 (L) 03/26/2023   GFRAA >60 01/22/2019   CALCIUM 6.7 (L) 03/26/2023   PHOS 3.1 03/24/2023   PROT 4.9 (L) 03/26/2023   ALBUMIN 2.1 (L) 03/26/2023   BILITOT 8.8 (H) 03/26/2023   ALKPHOS 82 03/26/2023   AST 79 (H) 03/26/2023   ALT 24 03/26/2023   ANIONGAP 8 03/26/2023    GFR: Estimated Creatinine Clearance: 61.6 mL/min (A) (by C-G formula based on SCr of 1.63 mg/dL (H)).  Recent Results (from the past 240 hour(s))  MRSA Next Gen by PCR, Nasal     Status: None   Collection Time: 03/26/23  2:47 AM   Specimen: Nasal Mucosa; Nasal Swab  Result Value Ref Range Status   MRSA by PCR Next Gen NOT DETECTED NOT DETECTED Final    Comment: (NOTE) The GeneXpert MRSA Assay (FDA approved for NASAL specimens only), is one component of a comprehensive MRSA colonization surveillance program. It is not intended to diagnose MRSA infection nor to guide or monitor treatment for MRSA infections. Test performance is not FDA approved in patients less than 66 years old. Performed at [facility], [address], [place], [place] [address]       Radiology Studies: DG CHEST PORT 1 VIEW  Result Date: 03/25/2023 CLINICAL DATA:  Dyspnea. EXAM: PORTABLE CHEST 1 VIEW COMPARISON:  Chest radiographs 05/19/2021 and 05/12/2021 FINDINGS: Cardiac silhouette and mediastinal  contours are within limits. Mildly decreased lung volumes. Left-greater-than-right basilar horizontal linear likely subsegmental atelectasis. No pleural effusion pneumothorax. No acute skeletal abnormality. IMPRESSION: Left-greater-than-right basilar horizontal linear likely subsegmental atelectasis. It is difficult to entirely exclude underlying left basilar pneumonia. Electronically Signed   By: Neita Garnet M.D.   On: 03/25/2023 15:02      LOS: 3 days    Jacquelin Hawking, MD [facility] 03/26/2023, 8:20 AM   If 7PM-7AM, please contact night-coverage [email or web address]

## 2023-03-26 NOTE — Progress Notes (Signed)
OT Cancellation Note  Patient Details Name: Martin Anthony MRN: [id number] DOB: 1966-06-01   Cancelled Treatment:    Reason Eval/Treat Not Completed: Medical issues which prohibited therapy (transferred to ICU. Plan to get blood. Will hold OT eval at this time. Will check in on patient tomorrow and evaluate if appropriate.)  Limmie Patricia, OTR/L,CBIS  Supplemental OT - [facility] and [facility] Secure Chat Preferred   03/26/2023, 8:41 AM

## 2023-03-26 NOTE — Progress Notes (Signed)
Martin Anthony 3:05 PM  Subjective: Patient now very sedated and  his hospital computer chart reviewed and his case discussed with my partner as well as the hospital doctor and his nurse and he is not obviously having active bleeding and his blood pressure is doing a little better  Objective: Vital signs currently stable no acute distress sleeping comfortably abdomen is soft nontender no pedal edema labs and CTA reviewed and endoscopy reviewed  Assessment: Multiple medical problems in patient with cirrhosis and varices and recurrent GI bleeding and history of colon cancer  Plan: If recurrent significant bleeding continues would consider a TIPS next and the question of possibly using steroids for alcoholic hepatitis was brought up by the hospital team but would not do that as long as having active GI bleeding and also would not use that if he plans to continue drinking and will check on tomorrow [facility]  office [phone] After 5PM or if no answer call [phone]

## 2023-03-26 NOTE — Progress Notes (Signed)
Assumed care of patient at approximately 0230. Patient mildly hypotensive, confused, and having difficulty following commands. Patient is oriented to self only. Patient noted to have small amount of dark red blood in drainage bag attached to colostomy. Patient is jaundice, scleral icterus, and covered in cuts and bruises. Patient is very restless, CIWA noted to be elevated, BP better, ativan given per CIWA.    0400: Difficulty getting patient to CT due to staffing and patient condition. Patient hemodynamically stable at this time. NP agreeable to holding off while patient stable.   1610: Blood redrawn for CBC. 1 unit PRBC started after blood draw.    0456: Hgb 6.4, Ouma, NP aware.    0600: Patient transported to CT. Patient confused and aggressive in CT. Ativan given.  0630: Attempted swallow screen, failed.   9604: Patient still aggressive, not redirectable. Patient attempting to chew on mitts. Ativan given and precedex started.

## 2023-03-26 NOTE — Progress Notes (Signed)
Vascular and Interventional Radiology  On Call Phone Note  Patient: Martin Anthony DOB: [birth year] Medical Record Number: [id number] Note Date/Time: 03/26/23 9:01 AM   Admitting Diagnosis: Acute GI bleeding [K92.2] Gastrointestinal hemorrhage associated with gastroduodenitis [K29.91]  1. Gastrointestinal hemorrhage associated with gastroduodenitis      Assessment  Plan: 57 y.o. year old male whom PM ICU APP reached out to VIR On Call Physician with concern for GIB. Briefly, comorbid w PMHx of decompensated EtOH cirrhosis and portal HTN, ileostomy bleed. Recent EGD and ileoscopy (03/25/23) w G3 Evs which were eradicated w banding.  Acute re-bleeding concern overnight. Repeat CTA requested, recent (03/23/23) was negative for hemorrhage.  CTA AP, 03/26/23 Imaging independently reviewed, demonstrating cirrhotic morphology of liver and small parastomal varices.  No acute GIB.    MELD 3.0: 30 at 03/25/2023 10:52 PM MELD-Na: 29 at 03/25/2023 10:52 PM Calculated from: Serum Creatinine: 2.50 mg/dL at 74/25/9563 87:56 PM Serum Sodium: 133 mmol/L at 03/25/2023 10:52 PM Total Bilirubin: 6.0 mg/dL at 43/32/9518 84:16 AM Serum Albumin: 2.2 g/dL at 60/63/0160 10:93 AM INR(ratio): 1.6 at 03/23/2023  7:30 PM Age at listing (hypothetical): 56 years Sex: Male at 03/25/2023 10:52 PM  Risk stratification Pre TIPS: MELD 29 (03/25/23). Child-Pugh class C, 11 pts - Severe hepatic dysfunction. [place] Index of Post-TIPS Survival (FIPS) = 1.90 1 month / 73.5%, 3 Months / 35.0%, 6 Months / 20.5%. High risk / Unfavorable. NH3; 59 (01/20/23)   Hepatic encephalopathy, subjective grading as described, Grade 1 (Altered, confused) TTE N/A   Recommendations:  *No acute VIR intervention at this time.  HIGH RISK for perioperative mortality with TIPS at this time. *VIR will be on standby for urgent / emergent TIPS should Pt acutely decompensate. *Ambulatory referral to VIR Clinic for TIPS evaluation, at  least one month post admission discharge *Repeat MELD labs and NH3 in 1 month   Thank you for allowing [name] to participate in the care of your Patient. Please contact the VIR Consult Team with questions, concerns, or if new issues arise.   As part of this Telephone encounter, no in-person exam was conducted.  The patient was physically located in [place] or a state in which I am permitted to provide care. The encounter was reasonable and appropriate under the circumstances given the patient's presentation at the time.  Roanna Banning, MD [facility]   Pager. [phone] Clinic. [phone]

## 2023-03-27 DIAGNOSIS — K7030 Alcoholic cirrhosis of liver without ascites: Secondary | ICD-10-CM | POA: Diagnosis not present

## 2023-03-27 DIAGNOSIS — K922 Gastrointestinal hemorrhage, unspecified: Secondary | ICD-10-CM | POA: Diagnosis not present

## 2023-03-27 DIAGNOSIS — F10931 Alcohol use, unspecified with withdrawal delirium: Secondary | ICD-10-CM | POA: Diagnosis not present

## 2023-03-27 DIAGNOSIS — K7011 Alcoholic hepatitis with ascites: Secondary | ICD-10-CM | POA: Diagnosis not present

## 2023-03-27 DIAGNOSIS — N179 Acute kidney failure, unspecified: Secondary | ICD-10-CM | POA: Diagnosis not present

## 2023-03-27 LAB — CBC
HCT: 27.1 % — ABNORMAL LOW (ref 39.0–52.0)
Hemoglobin: 8.8 g/dL — ABNORMAL LOW (ref 13.0–17.0)
MCH: 33.5 pg (ref 26.0–34.0)
MCHC: 32.5 g/dL (ref 30.0–36.0)
MCV: 103 fL — ABNORMAL HIGH (ref 80.0–100.0)
Platelets: 123 10*3/uL — ABNORMAL LOW (ref 150–400)
RBC: 2.63 MIL/uL — ABNORMAL LOW (ref 4.22–5.81)
RDW: 19.1 % — ABNORMAL HIGH (ref 11.5–15.5)
WBC: 9 10*3/uL (ref 4.0–10.5)
nRBC: 0 % (ref 0.0–0.2)

## 2023-03-27 LAB — BLOOD CULTURE ID PANEL (REFLEXED) - BCID2

## 2023-03-27 LAB — COMPREHENSIVE METABOLIC PANEL
ALT: 26 U/L (ref 0–44)
AST: 61 U/L — ABNORMAL HIGH (ref 15–41)
Albumin: 2.2 g/dL — ABNORMAL LOW (ref 3.5–5.0)
Alkaline Phosphatase: 94 U/L (ref 38–126)
Anion gap: 8 (ref 5–15)
BUN: 44 mg/dL — ABNORMAL HIGH (ref 6–20)
CO2: 16 mmol/L — ABNORMAL LOW (ref 22–32)
Calcium: 6.6 mg/dL — ABNORMAL LOW (ref 8.9–10.3)
Chloride: 110 mmol/L (ref 98–111)
Creatinine, Ser: 2.21 mg/dL — ABNORMAL HIGH (ref 0.61–1.24)
GFR, Estimated: 34 mL/min — ABNORMAL LOW (ref >60)
Glucose, Bld: 152 mg/dL — ABNORMAL HIGH (ref 70–99)
Potassium: 4.3 mmol/L (ref 3.5–5.1)
Sodium: 134 mmol/L — ABNORMAL LOW (ref 135–145)
Total Bilirubin: 12.4 mg/dL — ABNORMAL HIGH (ref 0.3–1.2)
Total Protein: 5.3 g/dL — ABNORMAL LOW (ref 6.5–8.1)

## 2023-03-27 LAB — HEMOGLOBIN AND HEMATOCRIT, BLOOD
HCT: 27.4 % — ABNORMAL LOW (ref 39.0–52.0)
Hemoglobin: 8.4 g/dL — ABNORMAL LOW (ref 13.0–17.0)

## 2023-03-27 LAB — LACTIC ACID, PLASMA: Lactic Acid, Venous: 1 mmol/L (ref 0.5–1.9)

## 2023-03-27 LAB — PROTIME-INR
INR: 1.8 — ABNORMAL HIGH (ref 0.8–1.2)
Prothrombin Time: 21.3 s — ABNORMAL HIGH (ref 11.4–15.2)

## 2023-03-27 MED ORDER — SODIUM CHLORIDE 0.9% FLUSH
10.0000 mL | Freq: Two times a day (BID) | INTRAVENOUS | Status: DC
  Administered 2023-03-27 – 2023-04-07 (×21): 10 mL via INTRAVENOUS

## 2023-03-27 MED ORDER — STERILE WATER FOR INJECTION IV SOLN
INTRAVENOUS | Status: AC
  Filled 2023-03-27: qty 150
  Filled 2023-03-27: qty 1000

## 2023-03-27 MED ORDER — SODIUM CHLORIDE 0.9 % IV SOLN
2.0000 g | Freq: Every day | INTRAVENOUS | Status: DC
  Administered 2023-03-27 – 2023-04-05 (×10): 2 g via INTRAVENOUS
  Filled 2023-03-27 (×12): qty 20

## 2023-03-27 NOTE — Plan of Care (Signed)
  Problem: Clinical Measurements: Goal: Ability to maintain clinical measurements within normal limits will improve Outcome: Progressing Goal: Diagnostic test results will improve Outcome: Progressing Goal: Respiratory complications will improve Outcome: Progressing Goal: Cardiovascular complication will be avoided Outcome: Progressing   Problem: Elimination: Goal: Will not experience complications related to bowel motility Outcome: Progressing   Problem: Education: Goal: Knowledge of General Education information will improve Description: Including pain rating scale, medication(s)/side effects and non-pharmacologic comfort measures Outcome: Not Progressing   Problem: Health Behavior/Discharge Planning: Goal: Ability to manage health-related needs will improve Outcome: Not Progressing   Problem: Clinical Measurements: Goal: Will remain free from infection Outcome: Not Progressing   Problem: Activity: Goal: Risk for activity intolerance will decrease Outcome: Not Progressing   Problem: Nutrition: Goal: Adequate nutrition will be maintained Outcome: Not Progressing   Problem: Coping: Goal: Level of anxiety will decrease Outcome: Not Progressing

## 2023-03-27 NOTE — Plan of Care (Signed)
  Problem: Clinical Measurements: Goal: Cardiovascular complication will be avoided Outcome: Progressing   Problem: Education: Goal: Knowledge of General Education information will improve Description: Including pain rating scale, medication(s)/side effects and non-pharmacologic comfort measures Outcome: Not Progressing   Problem: Health Behavior/Discharge Planning: Goal: Ability to manage health-related needs will improve Outcome: Not Progressing   Problem: Clinical Measurements: Goal: Ability to maintain clinical measurements within normal limits will improve Outcome: Not Progressing Goal: Diagnostic test results will improve Outcome: Not Progressing   Problem: Nutrition: Goal: Adequate nutrition will be maintained Outcome: Not Progressing   Problem: Coping: Goal: Level of anxiety will decrease Outcome: Not Progressing

## 2023-03-27 NOTE — Progress Notes (Signed)
Martin Anthony 9:09 AM  Subjective: Patient difficult to arouse but no signs of bleeding no obvious new problems  Objective: Vital signs stable afebrile abdomen is nontender maybe a touch more distended than yesterday no blood in his ostomy BUN slight increase creatinine decreased bili slight increase hemoglobin stable white count and platelet count okay INR the same  Assessment: GI blood loss status post banding in  patient with cirrhosis  Plan: Continue octreotide for a few more days call me as needed consider TIPS either electively or if repeat bleeding will check on tomorrow  [facility] E  office [phone] After 5PM or if no answer call [phone]

## 2023-03-27 NOTE — Progress Notes (Signed)
PHARMACY - PHYSICIAN COMMUNICATION CRITICAL VALUE ALERT - BLOOD CULTURE IDENTIFICATION (BCID)  Martin Anthony is an 57 y.o. male who presented to [facility] on 03/23/2023 with a chief complaint of  Blood from ileostomy, abdominal pain.   Assessment:   10/11 BCx: 1 out of 3 gpc in chains; BCID detected streptococcus species.  Name of physician (or Provider) ContactedCaleb Popp  Current antibiotics: none  Changes to prescribed antibiotics recommended:  Recommendations accepted by provider to initiate Rocephin 2 g IV q24h  Results for orders placed or performed during the hospital encounter of 03/23/23  Blood Culture ID Panel (Reflexed) (Collected: 03/26/2023  9:47 AM)  Result Value Ref Range   Enterococcus faecalis NOT DETECTED NOT DETECTED   Enterococcus Faecium NOT DETECTED NOT DETECTED   Listeria monocytogenes NOT DETECTED NOT DETECTED   Staphylococcus species NOT DETECTED NOT DETECTED   Staphylococcus aureus (BCID) NOT DETECTED NOT DETECTED   Staphylococcus epidermidis NOT DETECTED NOT DETECTED   Staphylococcus lugdunensis NOT DETECTED NOT DETECTED   Streptococcus species DETECTED (A) NOT DETECTED   Streptococcus agalactiae NOT DETECTED NOT DETECTED   Streptococcus pneumoniae NOT DETECTED NOT DETECTED   Streptococcus pyogenes NOT DETECTED NOT DETECTED   A.calcoaceticus-baumannii NOT DETECTED NOT DETECTED   Bacteroides fragilis NOT DETECTED NOT DETECTED   Enterobacterales NOT DETECTED NOT DETECTED   Enterobacter cloacae complex NOT DETECTED NOT DETECTED   Escherichia coli NOT DETECTED NOT DETECTED   Klebsiella aerogenes NOT DETECTED NOT DETECTED   Klebsiella oxytoca NOT DETECTED NOT DETECTED   Klebsiella pneumoniae NOT DETECTED NOT DETECTED   Proteus species NOT DETECTED NOT DETECTED   Salmonella species NOT DETECTED NOT DETECTED   Serratia marcescens NOT DETECTED NOT DETECTED   Haemophilus influenzae NOT DETECTED NOT DETECTED   Neisseria meningitidis NOT DETECTED NOT DETECTED    Pseudomonas aeruginosa NOT DETECTED NOT DETECTED   Stenotrophomonas maltophilia NOT DETECTED NOT DETECTED   Candida albicans NOT DETECTED NOT DETECTED   Candida auris NOT DETECTED NOT DETECTED   Candida glabrata NOT DETECTED NOT DETECTED   Candida krusei NOT DETECTED NOT DETECTED   Candida parapsilosis NOT DETECTED NOT DETECTED   Candida tropicalis NOT DETECTED NOT DETECTED   Cryptococcus neoformans/gattii NOT DETECTED NOT DETECTED    Lynden Ang, PharmD, BCPS 03/27/2023  9:39 AM

## 2023-03-27 NOTE — Progress Notes (Signed)
OT Cancellation Note  Patient Details Name: Martin Anthony MRN: [id number] DOB: [birth year]   Cancelled Treatment:    Reason Eval/Treat Not Completed: Medical issues which prohibited therapy Nurse asking therapy to hold off reporting that patient is too unstable. OT to continue to follow and check back on 10/13 as schedule will allow.  Rosalio Loud, MS Acute Rehabilitation Department Office# [phone]  03/27/2023, 1:44 PM

## 2023-03-27 NOTE — Progress Notes (Signed)
PROGRESS NOTE    RONDO KOEL  EAV:[id number] DOB: [birth year] DOA: 03/23/2023 PCP: Nelwyn Salisbury, MD   Brief Narrative: Martin Anthony is a 57 y.o. male with a history of alcoholism, cirrhosis, CKD stage II, portal hypertension, esophageal varices, colon cancer status post hemicolectomy.  Patient presented secondary to blood from ileostomy associated abdominal pain concerning for GI bleeding.  Patient found to have associated acute anemia requiring blood transfusion on admission.  [facility] gastroenterology was consulted for management. Patient underwent upper endoscopy with banding of varices. He suffered recurrent GI hemorrhaging with associated shock requiring vasopressor support and ICU admission. During workup, blood cultures also found to be positive for streptococcus. Alcohol withdrawal symptoms worsened requiring addition of Precedex infusion. PCCM consulted for persistent hypotension, complicated by agitation requiring Precedex.   Assessment and Plan:  Acute GI bleeding CTA negative for identification of source but was notable for inflammatory changes and wall-thickening  involving the second portion of the duodenum. Patient started empirically on Protonix IV. GI consulted. Patient has a history of esophageal varices and portal hypertension. Upper endoscopy performed on 10/10 significant for grade III esophogeal varices that were completely eradicated/banded in addition to portal hypertensive gastropathy and congested duodenal mucosa. Patient with recurrent GI bleeding overnight. CT angio GI bleed without identified source for bleeding. -GI recommendations: IV Protonix, Octreotide; pending today  Acute on chronic anemia Acute blood loss anemia Hemorrhagic shock Baseline hemoglobin of 9-11. Hemoglobin of 7.8 on admission. Patient has required 5 units of PRBC to date secondary to initial GI bleeding and recurrent GI bleeding.  Hemoglobin stable. -Daily CBC  Septic shock Patient with  positive blood cultures with BCID positive for streptococcus species which likely contributed to initial shock requiring ICU transfer. -Ceftriaxone -Follow-up culture data -Wean Levophed as able  Cirrhosis with ascites Esophageal varies Portal hypertension Secondary to alcohol. MELD Na of 24 on admission; up to 33. Maddrey's discriminant function of 35.9. Lasix and spironolactone held secondary to acute GI bleeding. GI recommendation to hold steroids while having GI bleeding. -Hold nadolol, spironolactone and lasix  Upper abdominal pain Seems to be somewhat pleuritic in nature vs worsened deep inspiration. Unclear etiology. Chest x-ray non-specific but cannot exclude pneumonia. No associated fevers, leukocytosis or cough.  Hyperbilirubinemia Related to underlying cirrhosis. Bilirubin worsening.   AKI on CKD 2 Baseline creatinine appears to be around 1.3. Creatinine of 1.75 with peak to 2.56 in setting of hemorrhagic shock -IV fluids -BMP in AM  Alcohol abuse Alcohol withdrawal Patient started on CIWA. Worsening withdrawal symptoms requiring initiation of Precedex infusion. -Continue Precedex, wean off as able; PCCM consulted -Continue CIWA  Colon cancer S/p hemicolectomy. Under observation.  DVT prophylaxis: SCDs Code Status:   Code Status: Full Code Family Communication: None at bedside Disposition Plan: Discharge home likely in 5+ days pending stable hemoglobin, GI recommendations, improvement of alcohol withdrawal, outpatient antibiotic regimen and improvement in AKI   Consultants:  [facility] PCCM  Procedures:  10/10: Upper endoscopy/Ileoscopy  Antimicrobials: Ceftriaxone    Subjective: Patient requiring increase in Levophed overnight in addition to Precedex. Continued agitation. Nursing has also noted decreased urine output with evidence of urinary retention. Foley catheter placed overnight.  Objective: BP (!) 100/53   Pulse (!) 57   Temp 97.8 F  (36.6 C) (Axillary)   Resp 16   Ht 6\' 4"  (1.93 m)   Wt 88.5 kg   SpO2 99%   BMI 23.75 kg/m   Examination:  General exam: Appears calm and  comfortable Respiratory system: Clear to auscultation. Respiratory effort normal. Cardiovascular system: S1 & S2 heard, mild bradycardia with regular rhythm. No murmurs, rubs, gallops or clicks. Gastrointestinal system: Abdomen is distended, soft and non-tender. Normal bowel sounds heard. Central nervous system: Obtunded. No focal neurological deficits.   Data Reviewed: I have personally reviewed following labs and imaging studies  CBC Lab Results  Component Value Date   WBC 9.0 03/27/2023   RBC 2.63 (L) 03/27/2023   HGB 8.8 (L) 03/27/2023   HCT 27.1 (L) 03/27/2023   MCV 103.0 (H) 03/27/2023   MCH 33.5 03/27/2023   PLT 123 (L) 03/27/2023   MCHC 32.5 03/27/2023   RDW 19.1 (H) 03/27/2023   LYMPHSABS 0.3 (L) 03/23/2023   MONOABS 0.4 03/23/2023   EOSABS 0.0 03/23/2023   BASOSABS 0.0 03/23/2023     Last metabolic panel Lab Results  Component Value Date   NA 134 (L) 03/27/2023   K 4.3 03/27/2023   CL 110 03/27/2023   CO2 16 (L) 03/27/2023   BUN 44 (H) 03/27/2023   CREATININE 2.21 (H) 03/27/2023   GLUCOSE 152 (H) 03/27/2023   GFRNONAA 34 (L) 03/27/2023   GFRAA >60 01/22/2019   CALCIUM 6.6 (L) 03/27/2023   PHOS 3.1 03/24/2023   PROT 5.3 (L) 03/27/2023   ALBUMIN 2.2 (L) 03/27/2023   BILITOT 12.4 (H) 03/27/2023   ALKPHOS 94 03/27/2023   AST 61 (H) 03/27/2023   ALT 26 03/27/2023   ANIONGAP 8 03/27/2023    GFR: Estimated Creatinine Clearance: 46.4 mL/min (A) (by C-G formula based on SCr of 2.21 mg/dL (H)).  Recent Results (from the past 240 hour(s))  MRSA Next Gen by PCR, Nasal     Status: None   Collection Time: 03/26/23  2:47 AM   Specimen: Nasal Mucosa; Nasal Swab  Result Value Ref Range Status   MRSA by PCR Next Gen NOT DETECTED NOT DETECTED Final    Comment: (NOTE) The GeneXpert MRSA Assay (FDA approved for NASAL  specimens only), is one component of a comprehensive MRSA colonization surveillance program. It is not intended to diagnose MRSA infection nor to guide or monitor treatment for MRSA infections. Test performance is not FDA approved in patients less than 83 years old. Performed at [facility], [address] [place], [place] [address]   Culture, blood (Routine X 2) w Reflex to ID Panel     Status: None (Preliminary result)   Collection Time: 03/26/23  9:47 AM   Specimen: BLOOD  Result Value Ref Range Status   Specimen Description   Final    BLOOD BLOOD RIGHT ARM Performed at [facility], [address] [place], [place] [address]    Special Requests   Final    BOTTLES DRAWN AEROBIC AND ANAEROBIC Blood Culture results may not be optimal due to an inadequate volume of blood received in culture bottles Performed at [facility], [address] [place], [place] [address]    Culture  Setup Time   Final    GRAM POSITIVE COCCI IN CHAINS ANAEROBIC BOTTLE ONLY Organism ID to follow CRITICAL RESULT CALLED TO, READ BACK BY AND VERIFIED WITH: C WINSOR,[email or web address]  03/27/23 MK Performed at [facility], [address] [place], [place] [address]    Culture GRAM POSITIVE COCCI IN CHAINS  Final   Report Status PENDING  Incomplete  Blood Culture ID Panel (Reflexed)     Status: Abnormal   Collection Time: 03/26/23  9:47 AM  Result  Value Ref Range Status   Enterococcus faecalis NOT DETECTED NOT DETECTED Final   Enterococcus Faecium NOT DETECTED NOT DETECTED Final   Listeria monocytogenes NOT DETECTED NOT DETECTED Final   Staphylococcus species NOT DETECTED NOT DETECTED Final   Staphylococcus aureus (BCID) NOT DETECTED NOT DETECTED Final   Staphylococcus epidermidis NOT DETECTED NOT DETECTED Final   Staphylococcus lugdunensis NOT DETECTED NOT DETECTED Final   Streptococcus species DETECTED (A) NOT DETECTED Final    Comment: Not  Enterococcus species, Streptococcus agalactiae, Streptococcus pyogenes, or Streptococcus pneumoniae. CRITICAL RESULT CALLED TO, READ BACK BY AND VERIFIED WITH: C WINSOR,[email or web address]  03/27/23 MK    Streptococcus agalactiae NOT DETECTED NOT DETECTED Final   Streptococcus pneumoniae NOT DETECTED NOT DETECTED Final   Streptococcus pyogenes NOT DETECTED NOT DETECTED Final   A.calcoaceticus-baumannii NOT DETECTED NOT DETECTED Final   Bacteroides fragilis NOT DETECTED NOT DETECTED Final   Enterobacterales NOT DETECTED NOT DETECTED Final   Enterobacter cloacae complex NOT DETECTED NOT DETECTED Final   Escherichia coli NOT DETECTED NOT DETECTED Final   Klebsiella aerogenes NOT DETECTED NOT DETECTED Final   Klebsiella oxytoca NOT DETECTED NOT DETECTED Final   Klebsiella pneumoniae NOT DETECTED NOT DETECTED Final   Proteus species NOT DETECTED NOT DETECTED Final   Salmonella species NOT DETECTED NOT DETECTED Final   Serratia marcescens NOT DETECTED NOT DETECTED Final   Haemophilus influenzae NOT DETECTED NOT DETECTED Final   Neisseria meningitidis NOT DETECTED NOT DETECTED Final   Pseudomonas aeruginosa NOT DETECTED NOT DETECTED Final   Stenotrophomonas maltophilia NOT DETECTED NOT DETECTED Final   Candida albicans NOT DETECTED NOT DETECTED Final   Candida auris NOT DETECTED NOT DETECTED Final   Candida glabrata NOT DETECTED NOT DETECTED Final   Candida krusei NOT DETECTED NOT DETECTED Final   Candida parapsilosis NOT DETECTED NOT DETECTED Final   Candida tropicalis NOT DETECTED NOT DETECTED Final   Cryptococcus neoformans/gattii NOT DETECTED NOT DETECTED Final    Comment: Performed at [facility], [address], [place], [place] [address]  Culture, blood (Routine X 2) w Reflex to ID Panel     Status: None (Preliminary result)   Collection Time: 03/26/23  9:50 AM   Specimen: BLOOD  Result Value Ref Range Status   Specimen Description   Final    BLOOD BLOOD RIGHT HAND Performed at  [facility], [address], [place], [place] [address]    Special Requests   Final    BOTTLES DRAWN AEROBIC ONLY Blood Culture adequate volume Performed at [facility], [address] [place], [place] [address]    Culture   Final    NO GROWTH < 12 HOURS Performed at [facility], [address], [place], [place] [address]    Report Status PENDING  Incomplete      Radiology Studies: CT ANGIO GI BLEED  Result Date: 03/26/2023 CLINICAL DATA:  GI bleeding. Evaluate bleeding at ileostomy site. Cirrhosis. EXAM: CTA ABDOMEN AND PELVIS WITHOUT AND WITH CONTRAST TECHNIQUE: Multidetector CT imaging of the abdomen and pelvis was performed using the standard protocol during bolus administration of intravenous contrast. Multiplanar reconstructed images and MIPs were obtained and reviewed to evaluate the vascular anatomy. RADIATION DOSE REDUCTION: This exam was performed according to the departmental dose-optimization program which includes automated exposure control, adjustment of the mA and/or kV according to patient size and/or use of iterative reconstruction technique. CONTRAST:  80mL OMNIPAQUE IOHEXOL 350 MG/ML SOLN COMPARISON:  03/23/2023 FINDINGS: VASCULAR Aorta: Atherosclerotic  disease in the abdominal aorta without aneurysm, dissection or significant stenosis. Evaluation of the arterial structures is limited due to timing of the study. Slightly delayed imaging of the arterial structures. Celiac: Celiac trunk is widely patent. Main branch vessels of the celiac trunk are patent. Prominent hepatic arteries may be secondary to the cirrhosis. SMA: Patent without evidence of aneurysm, dissection, vasculitis or significant stenosis. Renals: Both renal arteries are patent without evidence of aneurysm, dissection, vasculitis, fibromuscular dysplasia or significant stenosis. IMA: Patent without evidence of aneurysm, dissection, vasculitis or significant stenosis.  Inflow: Patent without evidence of aneurysm, dissection, vasculitis or significant stenosis. Proximal Outflow: Proximal femoral arteries are patent bilaterally. Veins: Main portal vein is patent but limited evaluation of the intrahepatic portal veins on this examination. Splenic vein is patent. Again noted is an enlarged inferior mesenteric vein with associated varices in the left abdomen. These left abdominal varices appear to be associated with an enlarged left gonadal vein and suggestive for portal-systemic shunt. There is concern for narrowing involving the proximal left renal vein. Normal appearance of the pelvic veins. Review of the MIP images confirms the above findings. NON-VASCULAR Lower chest: Atelectasis and volume loss in the left lower lobe. Hepatobiliary: Diffuse nodularity of the liver compatible with cirrhosis. Enlargement of the caudate lobe. No discrete liver lesion identified. No biliary dilatation. Gallbladder is within normal limits. Pancreas: Unremarkable. No pancreatic ductal dilatation or surrounding inflammatory changes. Spleen: Splenomegaly. Enlarged accessory spleen medial to the spleen. Adrenals/Urinary Tract: Normal adrenal glands. 6 mm stone in the right kidney pole. At least 2 small stones in the left kidney. No hydronephrosis. No suspicious renal lesion. Urinary bladder is unremarkable. High-density material in the urinary bladder likely related to previous contrast enhanced examination. Stomach/Bowel: Normal appearance of the stomach. Study has limitations from motion artifact and difficult to evaluate for wall thickening and inflammatory changes involving the second portion of the duodenum. Findings are similar to the prior examination. In addition, there is a concern for wall thickening involving the proximal jejunum. Please note that the proximal jejunum is along the right side of the abdomen which is an unusual configuration and this small bowel configuration has changed from the  examination on 05/12/2021 prior to the bowel surgery. There is some swirling of the central mesenteric vessels associated with this small bowel. There is some mesenteric edema associated with the bowel loops in the right abdomen. Postoperative changes with an ileostomy. No clear evidence for active GI bleeding. High-density material near the ileostomy is suggestive for surgical clips. Enlarged venous structures associated with the ileostomy compatible with the portal hypertension and these could be a source of bleeding. Evidence for right hemicolectomy. Lymphatic: No significant lymph node enlargement in the abdomen or pelvis. Reproductive: Prostate is unremarkable. Other: Small to moderate amount of abdominopelvic ascites. Ascites has slightly increased particularly in the right lower quadrant. Postoperative changes along the anterior abdominal wall. A right inguinal hernia containing ascites. Musculoskeletal: No acute bone abnormality. IMPRESSION: VASCULAR 1. Limited evaluation of the arterial structures due to the timing of the study. Poor opacification of the arterial structures. 2. Atherosclerotic disease in the abdominal aorta. Main visceral arteries are patent. No significant arterial stenosis. 3. Varices associated with the ileostomy. Active bleeding is not identified in this area. 4. Varices in the left abdomen associated with the portosystemic shunt. Cannot exclude narrowing in the proximal left renal vein. NON-VASCULAR 1. Cirrhosis with portal hypertension demonstrated by splenomegaly, ascites and abdominal varices. 2. Postsurgical bowel changes with an  ileostomy and probable right hemicolectomy. Again noted is an abnormal configuration of the proximal jejunum in the right abdomen. This is different than the preoperative images from 2022. There is concern for wall thickening involving the duodenum or proximal jejunum. Difficult to know if the wall thickening is associated with an inflammatory process or  secondary to the portal hypertension. The configuration of the bowel may represent expected postoperative changes but uncertain. 3. Right inguinal hernia containing ascites. 4. Nonobstructive bilateral renal calculi. Electronically Signed   By: Richarda Overlie M.D.   On: 03/26/2023 08:25   DG CHEST PORT 1 VIEW  Result Date: 03/25/2023 CLINICAL DATA:  Dyspnea. EXAM: PORTABLE CHEST 1 VIEW COMPARISON:  Chest radiographs 05/19/2021 and 05/12/2021 FINDINGS: Cardiac silhouette and mediastinal contours are within limits. Mildly decreased lung volumes. Left-greater-than-right basilar horizontal linear likely subsegmental atelectasis. No pleural effusion pneumothorax. No acute skeletal abnormality. IMPRESSION: Left-greater-than-right basilar horizontal linear likely subsegmental atelectasis. It is difficult to entirely exclude underlying left basilar pneumonia. Electronically Signed   By: Neita Garnet M.D.   On: 03/25/2023 15:02      LOS: 4 days    Jacquelin Hawking, MD [facility] 03/27/2023, 7:20 AM   If 7PM-7AM, please contact night-coverage [email or web address]

## 2023-03-27 NOTE — Progress Notes (Signed)
PT Cancellation Note  Patient Details Name: JASIN BRAZEL MRN: [id number] DOB: 07-Sep-1965   Cancelled Treatment:     Reason Eval/Treat Not Completed: Medical issues which prohibited therapy Nurse asking therapy to hold off reporting that patient is too unstable.  Will follow.   Roseana Rhine 03/27/2023, 3:24 PM

## 2023-03-27 NOTE — Progress Notes (Signed)
NAME:  Martin Anthony, MRN:  [id number], DOB:  1965-11-29, LOS: 4 ADMISSION DATE:  03/23/2023, CONSULTATION DATE:  03/26/23 REFERRING MD:  Dr Mal Misty, CHIEF COMPLAINT:  agitated delirium, anemia   History of Present Illness:  57 year old man with history of alcoholism and alcoholic cirrhosis with portal hypertension and varices, CKD stage II, colon cancer status post hemicolectomy.  He was admitted 10/8 with abdominal pain and blood in his ileostomy.  Found to have acute anemia requiring transfusion.  He underwent EGD 10/10 with grade 3 esophageal varices that were banded, portal hypertensive gastropathy.  Continued on octreotide and IV Protonix.  He had recurrent GI bleeding postprocedure and a CT angio did not show any clear source.  He drinks about 1 pint of alcohol daily, began to show evidence for agitated delirium on 10/10 with increasing CIWA scores.  A Precedex infusion was started early 10/11.  Course further complicated by acute on chronic renal failure, SCr 2.56  Hgb 7.5 > 6.4 > 4.2 (question spurious) > 7.0.  PRBC ordered this morning after the 7.0 obtained.  INR 1.8  Pertinent  Medical History   Past Medical History:  Diagnosis Date   Anxiety    Ascites due to alcoholic cirrhosis (HCC)    Cirrhosis with alcoholism (HCC)    Depression    Diabetes mellitus without complication (HCC)    GERD (gastroesophageal reflux disease)    Gout    Hyperlipidemia    Hypertension    Primary adenocarcinoma of ascending colon (HCC)    Substance abuse (HCC)    alcoholism    Type 2 diabetes mellitus with diabetic neuropathy (HCC)     Significant Hospital Events: Including procedures, antibiotic start and stop dates in addition to other pertinent events     Interim History / Subjective:  Hemoglobin trend 7.0 > 7.7 > 8.8 > 8.8 S Cr 2.56 > 2.21 I/O+ 2.5 L total, urine output 1740 cc last 24 hours Precedex transiently increased overnight, currently 0.4 Norepinephrine 4    Objective    Blood pressure (!) 100/53, pulse (!) 57, temperature 97.8 F (36.6 C), temperature source Axillary, resp. rate 16, height 6\' 4"  (1.93 m), weight 88.5 kg, SpO2 99%.        Intake/Output Summary (Last 24 hours) at 03/27/2023 0842 Last data filed at 03/27/2023 0748 Gross per 24 hour  Intake 2197.12 ml  Output 1880 ml  Net 317.12 ml   Filed Weights   03/25/23 1145 03/26/23 0500 03/27/23 0441  Weight: 81.6 kg 85 kg 88.5 kg    Examination: General: Jaundiced ill-appearing man laying in bed HENT: Edentulous, oropharynx dry Lungs: Clear bilaterally Cardiovascular: Regular, no murmur Abdomen: Ileostomy is pink with brown stool in the bag.  No blood Extremities: No edema Skin: Jaundiced Neuro: He is sedated.  He will try to sit up in rales with stimulation.  Attempts to speak but garbled.  Does not follow commands GU: Pure wick in place  Resolved Hospital Problem list     Assessment & Plan:   Acute upper GI bleeding due to esophageal varices and portal gastropathy.  Underwent banding 10/10 with apparent eradication of the varices. -Hemoglobin appears to be stabilized.  Continue to follow every 12 hours for now -Octreotide infusion -Protonix IV twice daily -Appreciate GI assistance.  Appears that hemoglobin may be stabilizing.  Angiography or CT angio recommended if he re-bleeds  Acute agitated delirium due to alcohol withdrawal -Wean Precedex as able -Plan to transition to CIWA scoring and intermittent  dose benzos as he stabilizes -Minimize other sedating medications that we will change mental status -Restore adequate perfusion pressors -Consider lactulose, on rifaximin  Shock.  Suspect in part hemorrhagic shock and contribution of sedating medications superimposed on vasoplegia from his chronic liver disease.  Lactic acid reassuring 1.2 -Stabilizing from GI blood loss -Work to wean Precedex as a bleed this is a contributor to his hypotension -Wean norepinephrine for goal MAP  65  GPC 1 of 2 blood cultures, strep species.  Suspect contaminant -Covering with ceftriaxone while we await follow-up cultures.  His MRSA nasal swab was negative  Acute on chronic stage II renal failure, likely ATN from hypoperfusion Non-anion gap metabolic acidosis -Follow urine output, BMP -Maintain adequate perfusion pressures -Start low-dose bicarbonate infusion 10/12 for non-anion gap metabolic acidosis -Renal dose medications  Cirrhosis with ascites, esophageal varices and portal hypertension.  Currently his nadolol, spironolactone, Lasix are all on hold due to his hemodynamics. -Continue to hold Aldactone, Lasix, nadolol, carvedilol.  Consider restart as he stabilizes -Discriminant factor 35.9, not currently on steroids.  AST mildly elevated -Follow CMP, INR  History colon cancer -On observation  Depression -Hold home Zoloft   Best Practice (right click and "Reselect all SmartList Selections" daily)   Diet/type: NPO DVT prophylaxis: SCD GI prophylaxis: PPI Lines: N/A Foley:  N/A Code Status:  full code Last date of multidisciplinary goals of care discussion [pending]  Labs   CBC: Recent Labs  Lab 03/23/23 1930 03/23/23 1946 03/25/23 0014 03/25/23 2252 03/26/23 0424 03/26/23 0947 03/26/23 1046 03/26/23 1408 03/26/23 2133 03/27/23 0253  WBC 4.2   < > 3.1* 7.0 5.6 8.5  --   --   --  9.0  NEUTROABS 3.4  --   --   --   --   --   --   --   --   --   HGB 7.8*   < > 8.1* 7.5* 6.4* 4.2* 7.0* 7.7* 8.8* 8.8*  HCT 23.8*   < > 26.0* 23.7* 20.3* 13.0* 21.2* 23.5* 27.4* 27.1*  MCV 108.7*   < > 112.6* 111.3* 110.3* 106.6*  --   --   --  103.0*  PLT 89*   < > 80* 106* 79* 106*  --   --   --  123*   < > = values in this interval not displayed.    Basic Metabolic Panel: Recent Labs  Lab 03/24/23 0055 03/25/23 0014 03/25/23 2252 03/26/23 0321 03/26/23 0947 03/27/23 0253  NA 137 136 133* 132* 131* 134*  K 4.3 4.8 4.5 4.7 4.4 4.3  CL 111 112* 110 108 107 110   CO2 20* 18* 18* 16* 16* 16*  GLUCOSE 152* 163* 148* 137* 198* 152*  BUN 23* 34* 37* 38* 39* 44*  CREATININE 1.67* 2.42* 2.50* 1.63* 2.56* 2.21*  CALCIUM 7.7* 7.2* 6.8* 6.7* 6.5* 6.6*  MG 1.8  --  1.7  --   --   --   PHOS 3.1  --   --   --   --   --    GFR: Estimated Creatinine Clearance: 46.4 mL/min (A) (by C-G formula based on SCr of 2.21 mg/dL (H)). Recent Labs  Lab 03/25/23 2252 03/26/23 0424 03/26/23 0947 03/27/23 0253  WBC 7.0 5.6 8.5 9.0  LATICACIDVEN  --   --  1.2 1.0    Liver Function Tests: Recent Labs  Lab 03/24/23 0055 03/25/23 0014 03/26/23 0321 03/26/23 0947 03/27/23 0253  AST 83* 70* 79* 73* 61*  ALT 27 23 24 23 26   ALKPHOS 146* 111 82 80 94  BILITOT 5.7* 6.0* 8.8* 10.3* 12.4*  PROT 5.6* 5.3* 4.9* 4.8* 5.3*  ALBUMIN 2.3* 2.2* 2.1* 2.0* 2.2*   No results for input(s): "LIPASE", "AMYLASE" in the last 168 hours. Recent Labs  Lab 03/26/23 0947  AMMONIA 20    ABG    Component Value Date/Time   PHART 7.417 05/12/2021 0608   PCO2ART 20.5 (L) 05/12/2021 0608   PO2ART 127 (H) 05/12/2021 0608   HCO3 12.9 (L) 05/12/2021 0608   TCO2 17 (L) 03/23/2023 1946   ACIDBASEDEF 9.6 (H) 05/12/2021 0608   O2SAT 97.9 05/12/2021 0608     Coagulation Profile: Recent Labs  Lab 03/23/23 1930 03/26/23 0947 03/27/23 0253  INR 1.6* 1.8* 1.8*    Cardiac Enzymes: No results for input(s): "CKTOTAL", "CKMB", "CKMBINDEX", "TROPONINI" in the last 168 hours.  HbA1C: Hgb A1c MFr Bld  Date/Time Value Ref Range Status  05/12/2021 05:22 AM 5.8 (H) 4.8 - 5.6 % Final    Comment:    (NOTE)         Prediabetes: 5.7 - 6.4         Diabetes: >6.4         Glycemic control for adults with diabetes: <7.0   01/18/2019 11:00 AM 5.3 4.8 - 5.6 % Final    Comment:    (NOTE) Pre diabetes:          5.7%-6.4% Diabetes:              >6.4% Glycemic control for   <7.0% adults with diabetes     CBG: No results for input(s): "GLUCAP" in the last 168 hours.    Critical care  time: 33 min     Levy Pupa, MD, PhD 03/27/2023, 8:42 AM [facility] [phone] or if no answer before 7:00PM call [phone] For any issues after 7:00PM please call eLink [phone]

## 2023-03-28 ENCOUNTER — Encounter (HOSPITAL_COMMUNITY): Payer: Self-pay | Admitting: Gastroenterology

## 2023-03-28 DIAGNOSIS — K922 Gastrointestinal hemorrhage, unspecified: Secondary | ICD-10-CM | POA: Diagnosis not present

## 2023-03-28 DIAGNOSIS — F10931 Alcohol use, unspecified with withdrawal delirium: Secondary | ICD-10-CM | POA: Diagnosis not present

## 2023-03-28 LAB — PHOSPHORUS: Phosphorus: 3.6 mg/dL (ref 2.5–4.6)

## 2023-03-28 LAB — COMPREHENSIVE METABOLIC PANEL
ALT: 24 U/L (ref 0–44)
AST: 60 U/L — ABNORMAL HIGH (ref 15–41)
Albumin: 2 g/dL — ABNORMAL LOW (ref 3.5–5.0)
Alkaline Phosphatase: 87 U/L (ref 38–126)
Anion gap: 10 (ref 5–15)
BUN: 52 mg/dL — ABNORMAL HIGH (ref 6–20)
CO2: 16 mmol/L — ABNORMAL LOW (ref 22–32)
Calcium: 6.4 mg/dL — CL (ref 8.9–10.3)
Chloride: 107 mmol/L (ref 98–111)
Creatinine, Ser: 2.29 mg/dL — ABNORMAL HIGH (ref 0.61–1.24)
GFR, Estimated: 33 mL/min — ABNORMAL LOW (ref >60)
Glucose, Bld: 149 mg/dL — ABNORMAL HIGH (ref 70–99)
Potassium: 4.2 mmol/L (ref 3.5–5.1)
Sodium: 133 mmol/L — ABNORMAL LOW (ref 135–145)
Total Bilirubin: 10.9 mg/dL — ABNORMAL HIGH (ref 0.3–1.2)
Total Protein: 5 g/dL — ABNORMAL LOW (ref 6.5–8.1)

## 2023-03-28 LAB — CBC
HCT: 28.1 % — ABNORMAL LOW (ref 39.0–52.0)
Hemoglobin: 8.9 g/dL — ABNORMAL LOW (ref 13.0–17.0)
MCH: 33.2 pg (ref 26.0–34.0)
MCHC: 31.7 g/dL (ref 30.0–36.0)
MCV: 104.9 fL — ABNORMAL HIGH (ref 80.0–100.0)
Platelets: 106 10*3/uL — ABNORMAL LOW (ref 150–400)
RBC: 2.68 MIL/uL — ABNORMAL LOW (ref 4.22–5.81)
RDW: 19 % — ABNORMAL HIGH (ref 11.5–15.5)
WBC: 5.8 10*3/uL (ref 4.0–10.5)
nRBC: 0 % (ref 0.0–0.2)

## 2023-03-28 LAB — MAGNESIUM: Magnesium: 1.8 mg/dL (ref 1.7–2.4)

## 2023-03-28 MED ORDER — CALCIUM GLUCONATE-NACL 2-0.675 GM/100ML-% IV SOLN
2.0000 g | Freq: Once | INTRAVENOUS | Status: AC
  Administered 2023-03-28: 2000 mg via INTRAVENOUS
  Filled 2023-03-28: qty 100

## 2023-03-28 MED ORDER — LORAZEPAM 2 MG/ML IJ SOLN: 2.0000 mg | Freq: Once | INTRAMUSCULAR | Status: AC

## 2023-03-28 MED ORDER — DEXMEDETOMIDINE HCL IN NACL 200 MCG/50ML IV SOLN
0.2000 ug/kg/h | INTRAVENOUS | Status: DC
  Administered 2023-03-28: 1 ug/kg/h via INTRAVENOUS
  Administered 2023-03-28: 1.1 ug/kg/h via INTRAVENOUS
  Administered 2023-03-29: 1.2 ug/kg/h via INTRAVENOUS
  Administered 2023-03-29: 1.1 ug/kg/h via INTRAVENOUS
  Administered 2023-03-29 (×2): 1.2 ug/kg/h via INTRAVENOUS
  Filled 2023-03-28: qty 100
  Filled 2023-03-28 (×2): qty 50
  Filled 2023-03-28: qty 100

## 2023-03-28 MED ORDER — CHLORHEXIDINE GLUCONATE CLOTH 2 % EX PADS
6.0000 | MEDICATED_PAD | Freq: Every day | CUTANEOUS | Status: DC
  Administered 2023-03-28 – 2023-03-29 (×2): 6 via TOPICAL

## 2023-03-28 MED ORDER — LORAZEPAM 2 MG/ML IJ SOLN
2.0000 mg | INTRAMUSCULAR | Status: DC | PRN
  Administered 2023-03-28 – 2023-03-31 (×2): 2 mg via INTRAVENOUS
  Filled 2023-03-28 (×2): qty 1

## 2023-03-28 MED ORDER — LORAZEPAM 2 MG/ML IJ SOLN
INTRAMUSCULAR | Status: AC
  Administered 2023-03-28: 2 mg via INTRAVENOUS
  Filled 2023-03-28: qty 1

## 2023-03-28 NOTE — Progress Notes (Signed)
Martin Anthony 11:11 AM  Subjective: Patient without signs of bleeding and able to be aroused today and case discussed with friend at bedside and he refuses to quit drinking and has no new complaints Objective: Vital signs stable afebrile abdomen is soft nontender patient lethargic still getting some pain medicine and sedation BUN slight increase creatinine slight increase but not as high as it was 2 days ago globin stable  Assessment: Alcoholic cirrhosis seemingly resolved GI bleeding  Plan: Probably wean octreotide tomorrow and try to wean pain medicine and sedation and okay with me to use lactulose as well as Xifaxan and might consider TIPS electively when better particularly if BUN and creatinine returned to normal or if needed for active bleeding and will asked rounding partner to check on tomorrow [facility]  office [phone] After 5PM or if no answer call [phone]

## 2023-03-28 NOTE — Progress Notes (Signed)
NAME:  Martin Anthony, MRN:  [id number], DOB:  [birth year], LOS: 5 ADMISSION DATE:  03/23/2023, CONSULTATION DATE:  03/26/23 REFERRING MD:  Dr Mal Misty, CHIEF COMPLAINT:  agitated delirium, anemia   History of Present Illness:  57 year old man with history of alcoholism and alcoholic cirrhosis with portal hypertension and varices, CKD stage II, colon cancer status post hemicolectomy.  He was admitted 10/8 with abdominal pain and blood in his ileostomy.  Found to have acute anemia requiring transfusion.  He underwent EGD 10/10 with grade 3 esophageal varices that were banded, portal hypertensive gastropathy.  Continued on octreotide and IV Protonix.  He had recurrent GI bleeding postprocedure and a CT angio did not show any clear source.  He drinks about 1 pint of alcohol daily, began to show evidence for agitated delirium on 10/10 with increasing CIWA scores.  A Precedex infusion was started early 10/11.  Course further complicated by acute on chronic renal failure, SCr 2.56  Hgb 7.5 > 6.4 > 4.2 (question spurious) > 7.0.  PRBC ordered this morning after the 7.0 obtained.  INR 1.8  Pertinent  Medical History   Past Medical History:  Diagnosis Date   Anxiety    Ascites due to alcoholic cirrhosis (HCC)    Cirrhosis with alcoholism (HCC)    Depression    Diabetes mellitus without complication (HCC)    GERD (gastroesophageal reflux disease)    Gout    Hyperlipidemia    Hypertension    Primary adenocarcinoma of ascending colon (HCC)    Substance abuse (HCC)    alcoholism    Type 2 diabetes mellitus with diabetic neuropathy (HCC)     Significant Hospital Events: Including procedures, antibiotic start and stop dates in addition to other pertinent events   Blood cultures 10/11 2 of 2 >> GPC chains, strep species >>   Interim History / Subjective:  Norepinephrine 4 > 2 Precedex 0.7 > 0.4 SCr 2.56 > 2.21 > 2.29 I/O+ 3.7 L total, urine output 995cc last 24 hours Blood cultures positive GPC  chains, ceftriaxone started 10/12     Objective   Blood pressure (!) 113/52, pulse (!) 55, temperature (!) 97.5 F (36.4 C), temperature source Axillary, resp. rate 12, height 6\' 4"  (1.93 m), weight 90.1 kg, SpO2 100%.        Intake/Output Summary (Last 24 hours) at 03/28/2023 0850 Last data filed at 03/28/2023 0728 Gross per 24 hour  Intake 2728.33 ml  Output 1545 ml  Net 1183.33 ml   Filed Weights   03/26/23 0500 03/27/23 0441 03/28/23 0500  Weight: 85 kg 88.5 kg 90.1 kg    Examination: General: Jaundiced ill-appearing man.  He has restraint mittens in place HENT: Edentulous, scleral icterus Lungs: Clear bilaterally Cardiovascular: Getter without a murmur Abdomen: Ostomy is pink, brown stool in the bag, no blood Extremities: No edema Skin: Jaundiced Neuro: More awake and appropriate today.  Still some impulsive behavior with mittens in place.  Able to speak and be understood.  Still disoriented.  He does follow commands GU: Pure wick in place  Resolved Hospital Problem list     Assessment & Plan:   Acute upper GI bleeding due to esophageal varices and portal gastropathy.  Underwent banding 10/10 with apparent eradication of the varices. -Continue to follow hemoglobin -Continue octreotide infusion -Protonix twice daily -Appreciate GI assistance.  Hemoglobin appears to be stabilizing.  Would need CT angiography if he shows evidence of rebleeding  Acute agitated delirium due to alcohol withdrawal -Continue  to wean Precedex as able -Plan to transition to CIWA scoring intermittent dosing benzos as he stabilizes -Minimize other sedating medications -Restore adequate perfusion pressures -Consider lactulose.  He is on rifaximin  Shock.  Suspect in part hemorrhagic shock and contribution of sedating medications superimposed on vasoplegia from his chronic liver disease.  Also possible component septic shock given bacteremia -Stabilizing from GI blood loss -Treating  underlying GPC bacteremia -Continue to wean Precedex, likely contributing some to his hypotension  GPC 2 of 2 blood cultures, strep species.  GPC now seen in both blood cultures, less likely a contaminant -Continue to cover with ceftriaxone pending speciation.  Holding off on vancomycin for now as his MRSA nasal swab was negative but low threshold to start if he declines anyway, if pressor needs to go up -Order transthoracic echocardiogram  Acute on chronic stage II renal failure, likely ATN from hypoperfusion Non-anion gap metabolic acidosis -Follow BMP and urine output.  Appears SCr plateauing 2.29 -Maintain adequate perfusion pressures -Ordered low-dose bicarbonate infusion 10/12 for his non-anion gap metabolic acidosis -Renal dose medications  Cirrhosis with ascites, esophageal varices and portal hypertension.  Currently his nadolol, spironolactone, Lasix are all on hold due to his hemodynamics. -Continue to hold Aldactone, Lasix, nadolol, carvedilol.  Consider restart as he stabilizes -Discriminant factor 35.9, not currently on steroids.  AST mildly elevated -Follow LFT and INR intermittently  History colon cancer -On observation  Depression -Holding home Zoloft for now.  Restart when he can take p.o.   Best Practice (right click and "Reselect all SmartList Selections" daily)   Diet/type: NPO DVT prophylaxis: SCD GI prophylaxis: PPI Lines: N/A Foley:  N/A Code Status:  full code Last date of multidisciplinary goals of care discussion [pending]  Labs   CBC: Recent Labs  Lab 03/23/23 1930 03/23/23 1946 03/25/23 2252 03/26/23 0424 03/26/23 0947 03/26/23 1046 03/26/23 1408 03/26/23 2133 03/27/23 0253 03/27/23 1557 03/28/23 0011  WBC 4.2   < > 7.0 5.6 8.5  --   --   --  9.0  --  5.8  NEUTROABS 3.4  --   --   --   --   --   --   --   --   --   --   HGB 7.8*   < > 7.5* 6.4* 4.2*   < > 7.7* 8.8* 8.8* 8.4* 8.9*  HCT 23.8*   < > 23.7* 20.3* 13.0*   < > 23.5* 27.4*  27.1* 27.4* 28.1*  MCV 108.7*   < > 111.3* 110.3* 106.6*  --   --   --  103.0*  --  104.9*  PLT 89*   < > 106* 79* 106*  --   --   --  123*  --  106*   < > = values in this interval not displayed.    Basic Metabolic Panel: Recent Labs  Lab 03/24/23 0055 03/25/23 0014 03/25/23 2252 03/26/23 0321 03/26/23 0947 03/27/23 0253 03/28/23 0011  NA 137   < > 133* 132* 131* 134* 133*  K 4.3   < > 4.5 4.7 4.4 4.3 4.2  CL 111   < > 110 108 107 110 107  CO2 20*   < > 18* 16* 16* 16* 16*  GLUCOSE 152*   < > 148* 137* 198* 152* 149*  BUN 23*   < > 37* 38* 39* 44* 52*  CREATININE 1.67*   < > 2.50* 1.63* 2.56* 2.21* 2.29*  CALCIUM 7.7*   < > 6.8*  6.7* 6.5* 6.6* 6.4*  MG 1.8  --  1.7  --   --   --  1.8  PHOS 3.1  --   --   --   --   --  3.6   < > = values in this interval not displayed.   GFR: Estimated Creatinine Clearance: 44.7 mL/min (A) (by C-G formula based on SCr of 2.29 mg/dL (H)). Recent Labs  Lab 03/26/23 0424 03/26/23 0947 03/27/23 0253 03/28/23 0011  WBC 5.6 8.5 9.0 5.8  LATICACIDVEN  --  1.2 1.0  --     Liver Function Tests: Recent Labs  Lab 03/25/23 0014 03/26/23 0321 03/26/23 0947 03/27/23 0253 03/28/23 0011  AST 70* 79* 73* 61* 60*  ALT 23 24 23 26 24   ALKPHOS 111 82 80 94 87  BILITOT 6.0* 8.8* 10.3* 12.4* 10.9*  PROT 5.3* 4.9* 4.8* 5.3* 5.0*  ALBUMIN 2.2* 2.1* 2.0* 2.2* 2.0*   No results for input(s): "LIPASE", "AMYLASE" in the last 168 hours. Recent Labs  Lab 03/26/23 0947  AMMONIA 20    ABG    Component Value Date/Time   PHART 7.417 05/12/2021 0608   PCO2ART 20.5 (L) 05/12/2021 0608   PO2ART 127 (H) 05/12/2021 0608   HCO3 12.9 (L) 05/12/2021 0608   TCO2 17 (L) 03/23/2023 1946   ACIDBASEDEF 9.6 (H) 05/12/2021 0608   O2SAT 97.9 05/12/2021 0608     Coagulation Profile: Recent Labs  Lab 03/23/23 1930 03/26/23 0947 03/27/23 0253  INR 1.6* 1.8* 1.8*    Cardiac Enzymes: No results for input(s): "CKTOTAL", "CKMB", "CKMBINDEX", "TROPONINI" in  the last 168 hours.  HbA1C: Hgb A1c MFr Bld  Date/Time Value Ref Range Status  05/12/2021 05:22 AM 5.8 (H) 4.8 - 5.6 % Final    Comment:    (NOTE)         Prediabetes: 5.7 - 6.4         Diabetes: >6.4         Glycemic control for adults with diabetes: <7.0   01/18/2019 11:00 AM 5.3 4.8 - 5.6 % Final    Comment:    (NOTE) Pre diabetes:          5.7%-6.4% Diabetes:              >6.4% Glycemic control for   <7.0% adults with diabetes     CBG: No results for input(s): "GLUCAP" in the last 168 hours.    Critical care time: 32 min     Levy Pupa, MD, PhD 03/28/2023, 8:50 AM [place] [phone] or if no answer before 7:00PM call [phone] For any issues after 7:00PM please call eLink [phone]

## 2023-03-28 NOTE — Progress Notes (Signed)
Patient experienced episode of increased agitation. Patient pulled off his leads and was getting up out of bed. Blood pressure dropped to 70s/40 most likely due to orthostatic hypotension. Pressors increased and sedation adjusted.

## 2023-03-28 NOTE — Plan of Care (Signed)
  Problem: Clinical Measurements: Goal: Will remain free from infection Outcome: Progressing Goal: Diagnostic test results will improve Outcome: Progressing Goal: Respiratory complications will improve Outcome: Progressing Goal: Cardiovascular complication will be avoided Outcome: Progressing   Problem: Activity: Goal: Risk for activity intolerance will decrease Outcome: Progressing   Problem: Nutrition: Goal: Adequate nutrition will be maintained Outcome: Progressing   Problem: Elimination: Goal: Will not experience complications related to bowel motility Outcome: Progressing Goal: Will not experience complications related to urinary retention Outcome: Progressing   Problem: Skin Integrity: Goal: Risk for impaired skin integrity will decrease Outcome: Progressing   Problem: Bowel/Gastric: Goal: Will show no signs and symptoms of gastrointestinal bleeding Outcome: Progressing   Problem: Fluid Volume: Goal: Will show no signs and symptoms of excessive bleeding Outcome: Progressing

## 2023-03-28 NOTE — Progress Notes (Signed)
eLink Physician-Brief Progress Note Patient Name: Martin Anthony DOB: 1965-07-28 MRN: [id number]   Date of Service  03/28/2023  HPI/Events of Note  Hypocalcemia on a.m. labs.  eICU Interventions  Replacement ordered.     Intervention Category Minor Interventions: Electrolytes abnormality - evaluation and management  Carilyn Goodpasture 03/28/2023, 1:32 AM

## 2023-03-28 NOTE — Plan of Care (Signed)
  Problem: Clinical Measurements: Goal: Diagnostic test results will improve Outcome: Progressing Goal: Respiratory complications will improve Outcome: Progressing Goal: Cardiovascular complication will be avoided Outcome: Progressing   Problem: Nutrition: Goal: Adequate nutrition will be maintained Outcome: Progressing   Problem: Elimination: Goal: Will not experience complications related to bowel motility Outcome: Progressing Goal: Will not experience complications related to urinary retention Outcome: Progressing   Problem: Education: Goal: Knowledge of General Education information will improve Description: Including pain rating scale, medication(s)/side effects and non-pharmacologic comfort measures Outcome: Not Progressing   Problem: Health Behavior/Discharge Planning: Goal: Ability to manage health-related needs will improve Outcome: Not Progressing   Problem: Clinical Measurements: Goal: Ability to maintain clinical measurements within normal limits will improve Outcome: Not Progressing Goal: Will remain free from infection Outcome: Not Progressing   Problem: Activity: Goal: Risk for activity intolerance will decrease Outcome: Not Progressing   Problem: Coping: Goal: Level of anxiety will decrease Outcome: Not Progressing

## 2023-03-28 NOTE — Progress Notes (Signed)
PT Cancellation Note  Patient Details Name: GIOVONI BUNCH MRN: [id number] DOB: 1965-10-08   Cancelled Treatment:     PT eval attempted but deferred this date - RN advises pt sedated 2* increasing agitation.  Will follow.   Rodel Glaspy 03/28/2023, 10:36 AM

## 2023-03-28 NOTE — Progress Notes (Signed)
OT Cancellation Note  Patient Details Name: Martin Anthony MRN: [id number] DOB: 1966/03/25   Cancelled Treatment:    Reason Eval/Treat Not Completed: Medical issues which prohibited therapy Patient had increased agitation with nurse asking for therapy to hold off at this time. OT to continue to follow and check back as schedule will allow.  Rosalio Loud, MS Acute Rehabilitation Department Office# [phone]  03/28/2023, 10:33 AM

## 2023-03-29 ENCOUNTER — Inpatient Hospital Stay (HOSPITAL_COMMUNITY): Payer: Medicaid Other

## 2023-03-29 DIAGNOSIS — R7881 Bacteremia: Secondary | ICD-10-CM | POA: Diagnosis not present

## 2023-03-29 DIAGNOSIS — K922 Gastrointestinal hemorrhage, unspecified: Secondary | ICD-10-CM | POA: Diagnosis not present

## 2023-03-29 LAB — TYPE AND SCREEN
ABO/RH(D): O POS
Antibody Screen: NEGATIVE
Unit division: 0
Unit division: 0
Unit division: 0
Unit division: 0
Unit division: 0
Unit division: 0
Unit division: 0

## 2023-03-29 LAB — BPAM RBC
Blood Product Expiration Date: 202411042359
Blood Product Expiration Date: 202411052359
Blood Product Expiration Date: 202411072359
Blood Product Expiration Date: 202411112359
Blood Product Unit Number: 202411112359
Blood Product Unit Number: 202411112359
ISSUE DATE / TIME: 202410081935
ISSUE DATE / TIME: 202410091308
ISSUE DATE / TIME: 202410110415
ISSUE DATE / TIME: 202410111039
ISSUE DATE / TIME: 202410131357
ISSUING PHYSICIAN: 202410111039
ISSUING PHYSICIAN: 202411112359
ISSUING PHYSICIAN: 202411112359
PRODUCT CODE: 202410140019
PRODUCT CODE: 202411112359
Unit Type and Rh: 202410121729
Unit Type and Rh: 202411112359
Unit Type and Rh: 202411112359
Unit Type and Rh: 5100
Unit Type and Rh: 5100
Unit Type and Rh: 5100
Unit Type and Rh: 5100
Unit Type and Rh: 5100
Unit Type and Rh: 5100
Unit Type and Rh: 5100
Unit Type and Rh: 9500

## 2023-03-29 LAB — CBC
HCT: 26.9 % — ABNORMAL LOW (ref 39.0–52.0)
Hemoglobin: 8.6 g/dL — ABNORMAL LOW (ref 13.0–17.0)
MCH: 33.7 pg (ref 26.0–34.0)
MCHC: 32 g/dL (ref 30.0–36.0)
MCV: 105.5 fL — ABNORMAL HIGH (ref 80.0–100.0)
Platelets: 94 10*3/uL — ABNORMAL LOW (ref 150–400)
RBC: 2.55 MIL/uL — ABNORMAL LOW (ref 4.22–5.81)
RDW: 19 % — ABNORMAL HIGH (ref 11.5–15.5)
WBC: 3.5 10*3/uL — ABNORMAL LOW (ref 4.0–10.5)
nRBC: 0 % (ref 0.0–0.2)

## 2023-03-29 LAB — BASIC METABOLIC PANEL
Anion gap: 5 (ref 5–15)
BUN: 51 mg/dL — ABNORMAL HIGH (ref 6–20)
CO2: 19 mmol/L — ABNORMAL LOW (ref 22–32)
Calcium: 6.8 mg/dL — ABNORMAL LOW (ref 8.9–10.3)
Chloride: 111 mmol/L (ref 98–111)
Creatinine, Ser: 2.06 mg/dL — ABNORMAL HIGH (ref 0.61–1.24)
GFR, Estimated: 37 mL/min — ABNORMAL LOW (ref >60)
Glucose, Bld: 150 mg/dL — ABNORMAL HIGH (ref 70–99)
Potassium: 4 mmol/L (ref 3.5–5.1)
Sodium: 135 mmol/L (ref 135–145)

## 2023-03-29 LAB — ECHOCARDIOGRAM COMPLETE
AR max vel: 1.96 cm2
AV Area VTI: 2.11 cm2
AV Area mean vel: 1.88 cm2
AV Mean grad: 4 mm[Hg]
AV Peak grad: 7 mm[Hg]
Ao pk vel: 1.32 m/s
Area-P 1/2: 3.4 cm2
Height: 76 in
S' Lateral: 3.3 cm
Weight: 3171.1 [oz_av]

## 2023-03-29 LAB — PHOSPHORUS: Phosphorus: 3.1 mg/dL (ref 2.5–4.6)

## 2023-03-29 LAB — MAGNESIUM: Magnesium: 2 mg/dL (ref 1.7–2.4)

## 2023-03-29 MED ORDER — CALCIUM GLUCONATE-NACL 1-0.675 GM/50ML-% IV SOLN
1.0000 g | Freq: Once | INTRAVENOUS | Status: AC
  Administered 2023-03-29: 1000 mg via INTRAVENOUS
  Filled 2023-03-29: qty 50

## 2023-03-29 MED ORDER — PHENOBARBITAL SODIUM 65 MG/ML IJ SOLN
65.0000 mg | Freq: Once | INTRAMUSCULAR | Status: AC
  Administered 2023-03-29: 65 mg via INTRAVENOUS
  Filled 2023-03-29: qty 1

## 2023-03-29 MED ORDER — ALBUMIN HUMAN 25 % IV SOLN
25.0000 g | Freq: Four times a day (QID) | INTRAVENOUS | Status: AC
  Administered 2023-03-29 – 2023-03-30 (×4): 25 g via INTRAVENOUS
  Filled 2023-03-29 (×4): qty 100

## 2023-03-29 MED ORDER — PHENOBARBITAL SODIUM 65 MG/ML IJ SOLN
65.0000 mg | Freq: Two times a day (BID) | INTRAMUSCULAR | Status: AC
  Administered 2023-03-29 – 2023-03-30 (×3): 65 mg via INTRAVENOUS
  Filled 2023-03-29 (×3): qty 1

## 2023-03-29 MED ORDER — MIDODRINE HCL 5 MG PO TABS
5.0000 mg | ORAL_TABLET | Freq: Three times a day (TID) | ORAL | Status: DC
  Administered 2023-03-29: 5 mg via ORAL
  Filled 2023-03-29: qty 1

## 2023-03-29 MED ORDER — LACTULOSE 10 GM/15ML PO SOLN
10.0000 g | Freq: Two times a day (BID) | ORAL | Status: DC
  Administered 2023-03-29 – 2023-04-02 (×10): 10 g via ORAL
  Filled 2023-03-29 (×10): qty 15

## 2023-03-29 MED ORDER — MIDODRINE HCL 5 MG PO TABS: 10.0000 mg | ORAL_TABLET | Freq: Three times a day (TID) | ORAL | Status: DC

## 2023-03-29 MED ORDER — MIDODRINE HCL 5 MG PO TABS
5.0000 mg | ORAL_TABLET | Freq: Once | ORAL | Status: AC
  Administered 2023-03-29: 5 mg via ORAL
  Filled 2023-03-29: qty 1

## 2023-03-29 NOTE — Plan of Care (Signed)
  Problem: Education: Goal: Knowledge of General Education information will improve Description: Including pain rating scale, medication(s)/side effects and non-pharmacologic comfort measures 03/29/2023 0544 by Krystal Eaton, RN Outcome: Not Progressing 03/29/2023 0543 by Krystal Eaton, RN Outcome: Progressing   Problem: Health Behavior/Discharge Planning: Goal: Ability to manage health-related needs will improve 03/29/2023 0544 by Krystal Eaton, RN Outcome: Not Progressing 03/29/2023 0543 by Krystal Eaton, RN Outcome: Progressing

## 2023-03-29 NOTE — Progress Notes (Signed)
PT Cancellation Note  Patient Details Name: Martin Anthony MRN: [id number] DOB: 16-Jul-1965   Cancelled Treatment:    Reason Eval/Treat Not Completed: Medical issues which prohibited therapy Blanchard Kelch PT Acute Rehabilitation Services Office [phone] Weekend [phone]   Rada Hay 03/29/2023, 11:50 AM

## 2023-03-29 NOTE — Progress Notes (Signed)
OT Cancellation Note  Patient Details Name: LANIS STORLIE MRN: [id number] DOB: [birth year]   Cancelled Treatment:    Reason Eval/Treat Not Completed: Medical issues which prohibited therapy Patient on increased sedation and with restraints in place at this time. OT to continue to follow and check back when patient is more medically stable.  Rosalio Loud, MS Acute Rehabilitation Department Office# [phone]  03/29/2023, 8:08 AM

## 2023-03-29 NOTE — Progress Notes (Signed)
[facility] Progress Note  SUBJECTIVE:   Interval history: Martin Anthony was seen and evaluated today at bedside. Resting comfortably in bed, has bilateral wrist restraints. Somewhat somnolent, though oriented to person, place and time. Does appear to be encephalopathic. Ileostomy evaluated, no signs of GI bleeding, Hgb stable. Pending evaluation with ECHO for bacteremia (may also be causing encephalopathy).   Past Medical History:  Diagnosis Date   Anxiety    Ascites due to alcoholic cirrhosis (HCC)    Cirrhosis with alcoholism (HCC)    Depression    Diabetes mellitus without complication (HCC)    GERD (gastroesophageal reflux disease)    Gout    Hyperlipidemia    Hypertension    Primary adenocarcinoma of ascending colon (HCC)    Substance abuse (HCC)    alcoholism    Type 2 diabetes mellitus with diabetic neuropathy [facility])    Past Surgical History:  Procedure Laterality Date   BIOPSY  01/19/2019   Procedure: BIOPSY;  Surgeon: Jeani Hawking, MD;  Location: [facility];  Service: Endoscopy;;   COLON SURGERY  10/25/2021   right hemicolectomy with ostomy at [facility] in [place]   COLONOSCOPY N/A 01/19/2019   Procedure: COLONOSCOPY;  Surgeon: Jeani Hawking, MD;  Location: [facility];  Service: Endoscopy;  Laterality: N/A;   ESOPHAGEAL BANDING  03/25/2023   Procedure: ESOPHAGEAL BANDING;  Surgeon: Kerin Salen, MD;  Location: [facility];  Service: Gastroenterology;;   ESOPHAGOGASTRODUODENOSCOPY (EGD) WITH PROPOFOL N/A 01/19/2019   Procedure: ESOPHAGOGASTRODUODENOSCOPY (EGD) WITH PROPOFOL;  Surgeon: Jeani Hawking, MD;  Location: [facility];  Service: Endoscopy;  Laterality: N/A;   ESOPHAGOGASTRODUODENOSCOPY (EGD) WITH PROPOFOL N/A 03/25/2023   Procedure: ESOPHAGOGASTRODUODENOSCOPY (EGD) WITH PROPOFOL;  Surgeon: Kerin Salen, MD;  Location: [facility];  Service: Gastroenterology;  Laterality: N/A;   HEMOSTASIS CLIP PLACEMENT  01/19/2019   Procedure: HEMOSTASIS CLIP  PLACEMENT;  Surgeon: Jeani Hawking, MD;  Location: [facility];  Service: Endoscopy;;   HERNIA REPAIR     umbilical    ILEOSCOPY N/A 03/25/2023   Procedure: ILEOSCOPY THROUGH STOMA;  Surgeon: Kerin Salen, MD;  Location: [facility];  Service: Gastroenterology;  Laterality: N/A;   INGUINAL HERNIA REPAIR Bilateral    IR FLUORO GUIDE CV LINE RIGHT  05/13/2021   IR PARACENTESIS  05/19/2021   IR US GUIDE VASC ACCESS RIGHT  05/13/2021   POLYPECTOMY  01/19/2019   Procedure: POLYPECTOMY;  Surgeon: Jeani Hawking, MD;  Location: [facility];  Service: Endoscopy;;   Current Facility-Administered Medications  Medication Dose Route Frequency Provider Last Rate Last Admin   albumin human 25 % solution 25 g  25 g Intravenous Q6H Lorin Glass, MD   Stopped at 03/29/23 1055   cefTRIAXone (ROCEPHIN) 2 g in sodium chloride 0.9 % 100 mL IVPB  2 g Intravenous Daily Narda Bonds, MD   Stopped at 03/29/23 1125   Chlorhexidine Gluconate Cloth 2 % PADS 6 each  6 each Topical Q2200 Leslye Peer, MD   6 each at 03/28/23 2209   dexmedetomidine (PRECEDEX) 200 MCG/50ML (4 mcg/mL) infusion  0.2-2.4 mcg/kg/hr (Adjusted) Intravenous Titrated Leslye Peer, MD   Stopped at 03/29/23 1005   HYDROmorphone (DILAUDID) injection 0.5-1 mg  0.5-1 mg Intravenous Q4H PRN Kerin Salen, MD   1 mg at 03/28/23 1028   influenza vac split trivalent PF (FLULAVAL) injection 0.5 mL  0.5 mL Intramuscular Prior to discharge Narda Bonds, MD       LORazepam (ATIVAN) injection 2 mg  2 mg Intravenous  Q4H PRN Leslye Peer, MD   2 mg at 03/28/23 2042   nicotine (NICODERM CQ - dosed in mg/24 hr) patch 7 mg  7 mg Transdermal Daily Steffanie Rainwater, MD   7 mg at 03/28/23 1007   norepinephrine (LEVOPHED) 4mg  in (0.016 mg/mL) premix infusion  2-10 mcg/min Intravenous Titrated Jimmye Norman, NP   Stopped at 03/29/23 1101   octreotide (SANDOSTATIN) 500 mcg in sodium chloride 0.9 % 250 mL (2 mcg/mL) infusion  50 mcg/hr  Intravenous Continuous Kerin Salen, MD 25 mL/hr at 03/29/23 1200 50 mcg/hr at 03/29/23 1200   ondansetron (ZOFRAN) tablet 4 mg  4 mg Oral Q6H PRN Kerin Salen, MD       Or   ondansetron [facility]) injection 4 mg  4 mg Intravenous Q6H PRN Kerin Salen, MD       Oral care mouth rinse  15 mL Mouth Rinse PRN Narda Bonds, MD       pantoprazole (PROTONIX) injection 40 mg  40 mg Intravenous Q12H Narda Bonds, MD   40 mg at 03/29/23 1056   PHENObarbital (LUMINAL) injection 65 mg  65 mg Intravenous BID Lorin Glass, MD   65 mg at 03/29/23 1057   rifaximin (XIFAXAN) tablet 550 mg  550 mg Oral BID Kerin Salen, MD   550 mg at 03/29/23 1051   sodium chloride flush (NS) 0.9 % injection 10 mL  10 mL Intravenous Q12H Leslye Peer, MD   10 mL at 03/29/23 1057   sodium chloride flush (NS) 0.9 % injection 3 mL  3 mL Intravenous Q12H Kerin Salen, MD   3 mL at 03/28/23 1008   Allergies as of 03/23/2023 - Review Complete 03/23/2023  Allergen Reaction Noted   Acetaminophen  01/20/2023   Ibuprofen Other (See Comments) 01/20/2023   Review of Systems:  Review of Systems  Reason unable to perform ROS: Unable to reliably assess secondary to encephalopathy.    OBJECTIVE:   Temp:  [96.1 F (35.6 C)-97.9 F (36.6 C)] 96.1 F (35.6 C) (10/14 1130) Pulse Rate:  [41-56] 49 (10/14 0915) Resp:  [13-19] 14 (10/14 0915) BP: (76-149)/(32-78) 118/62 (10/14 0900) SpO2:  [97 %-100 %] 100 % (10/14 0915) Weight:  [89.9 kg] 89.9 kg (10/14 0500) Last BM Date : 03/26/23 Physical Exam Constitutional:      General: He is not in acute distress.    Appearance: He is ill-appearing.  HENT:     Mouth/Throat:     Mouth: Mucous membranes are dry.  Eyes:     General: Scleral icterus present.  Cardiovascular:     Rate and Rhythm: Regular rhythm. Bradycardia present.  Pulmonary:     Effort: No respiratory distress.     Breath sounds: Normal breath sounds.  Abdominal:     General: Bowel sounds are normal. There is no  distension.     Palpations: Abdomen is soft.     Comments: Ileostomy appreciated with orange/brown liquid stool noted.  Skin:    General: Skin is warm and dry.     Coloration: Skin is jaundiced.  Neurological:     Mental Status: He is lethargic.  Psychiatric:        Behavior: Behavior is cooperative.     Labs: Recent Labs    03/27/23 0253 03/27/23 1557 03/28/23 0011 03/29/23 0246  WBC 9.0  --  5.8 3.5*  HGB 8.8* 8.4* 8.9* 8.6*  HCT 27.1* 27.4* 28.1* 26.9*  PLT 123*  --  106*  94*   BMET Recent Labs    03/27/23 0253 03/28/23 0011 03/29/23 0246  NA 134* 133* 135  K 4.3 4.2 4.0  CL 110 107 111  CO2 16* 16* 19*  GLUCOSE 152* 149* 150*  BUN 44* 52* 51*  CREATININE 2.21* 2.29* 2.06*  CALCIUM 6.6* 6.4* 6.8*   LFT Recent Labs    03/28/23 0011  PROT 5.0*  ALBUMIN 2.0*  AST 60*  ALT 24  ALKPHOS 87  BILITOT 10.9*   PT/INR Recent Labs    03/27/23 0253  LABPROT 21.3*  INR 1.8*   Diagnostic imaging: ECHOCARDIOGRAM COMPLETE  Result Date: 03/29/2023    ECHOCARDIOGRAM REPORT   Patient Name:   Martin Anthony Date of Exam: 03/29/2023 Medical Rec #:  [id number]    Height:       76.0 in Accession #:    [id number]   Weight:       198.2 lb Date of Birth:  [birth year]   BSA:          2.207 m Patient Age:    56 years     BP:           99/46 mmHg Patient Gender: M            HR:           51 bpm. Exam Location:  Inpatient Procedure: 2D Echo, Cardiac Doppler and Color Doppler Indications:    Bacteremia  History:        Patient has no prior history of Echocardiogram examinations.                 Risk Factors:Diabetes and Hypertension.  Sonographer:    Darlys Gales Referring Phys: [id number] ROBERT S BYRUM IMPRESSIONS  1. Left ventricular ejection fraction, by estimation, is 55 to 60%. The left ventricle has normal function. The left ventricle has no regional wall motion abnormalities. Left ventricular diastolic parameters are consistent with Grade I diastolic dysfunction (impaired relaxation).   2. Right ventricular systolic function is normal. The right ventricular size is normal. There is normal pulmonary artery systolic pressure. The estimated right ventricular systolic pressure is 25.5 mmHg.  3. The mitral valve is normal in structure. Trivial mitral valve regurgitation. No evidence of mitral stenosis.  4. The aortic valve is tricuspid. There is mild calcification of the aortic valve. Aortic valve regurgitation is trivial. No aortic stenosis is present.  5. The inferior vena cava is normal in size with <50% respiratory variability, suggesting right atrial pressure of 8 mmHg. FINDINGS  Left Ventricle: Left ventricular ejection fraction, by estimation, is 55 to 60%. The left ventricle has normal function. The left ventricle has no regional wall motion abnormalities. The left ventricular internal cavity size was normal in size. There is  no left ventricular hypertrophy. Left ventricular diastolic parameters are consistent with Grade I diastolic dysfunction (impaired relaxation). Right Ventricle: The right ventricular size is normal. No increase in right ventricular wall thickness. Right ventricular systolic function is normal. There is normal pulmonary artery systolic pressure. The tricuspid regurgitant velocity is 2.09 m/s, and  with an assumed right atrial pressure of 8 mmHg, the estimated right ventricular systolic pressure is 25.5 mmHg. Left Atrium: Left atrial size was normal in size. Right Atrium: Right atrial size was normal in size. Pericardium: There is no evidence of pericardial effusion. Mitral Valve: The mitral valve is normal in structure. Trivial mitral valve regurgitation. No evidence of mitral valve stenosis. Tricuspid Valve: The tricuspid valve is normal  in structure. Tricuspid valve regurgitation is trivial. Aortic Valve: The aortic valve is tricuspid. There is mild calcification of the aortic valve. Aortic valve regurgitation is trivial. No aortic stenosis is present. Aortic valve mean  gradient measures 4.0 mmHg. Aortic valve peak gradient measures 7.0 mmHg. Aortic valve area, by VTI measures 2.11 cm. Pulmonic Valve: The pulmonic valve was normal in structure. Pulmonic valve regurgitation is not visualized. Aorta: The aortic root is normal in size and structure. Venous: The inferior vena cava is normal in size with less than 50% respiratory variability, suggesting right atrial pressure of 8 mmHg. IAS/Shunts: No atrial level shunt detected by color flow Doppler.  LEFT VENTRICLE PLAX 2D LVIDd:         4.60 cm   Diastology LVIDs:         3.30 cm   LV e' medial:    8.92 cm/s LV PW:         1.10 cm   LV E/e' medial:  7.8 LV IVS:        1.30 cm   LV e' lateral:   9.68 cm/s LVOT diam:     2.00 cm   LV E/e' lateral: 7.2 LV SV:         78 LV SV Index:   35 LVOT Area:     3.14 cm  RIGHT VENTRICLE RV S prime:     11.90 cm/s TAPSE (M-mode): 3.3 cm LEFT ATRIUM             Index        RIGHT ATRIUM           Index LA Vol (A2C):   66.1 ml 29.95 ml/m  RA Area:     11.50 cm LA Vol (A4C):   55.3 ml 25.06 ml/m  RA Volume:   19.80 ml  8.97 ml/m LA Biplane Vol: 64.3 ml 29.14 ml/m  AORTIC VALVE AV Area (Vmax):    1.96 cm AV Area (Vmean):   1.88 cm AV Area (VTI):     2.11 cm AV Vmax:           132.00 cm/s AV Vmean:          97.200 cm/s AV VTI:            0.370 m AV Peak Grad:      7.0 mmHg AV Mean Grad:      4.0 mmHg LVOT Vmax:         82.20 cm/s LVOT Vmean:        58.200 cm/s LVOT VTI:          0.248 m LVOT/AV VTI ratio: 0.67  AORTA Ao Root diam: 3.70 cm MITRAL VALVE               TRICUSPID VALVE MV Area (PHT): 3.40 cm    TR Peak grad:   17.5 mmHg MV Decel Time: 223 msec    TR Vmax:        209.00 cm/s MV E velocity: 69.80 cm/s MV A velocity: 71.60 cm/s  SHUNTS MV E/A ratio:  0.97        Systemic VTI:  0.25 m                            Systemic Diam: 2.00 cm Dalton McleanMD Electronically signed by Wilfred Lacy Signature Date/Time: 03/29/2023/1:57:04 PM    Final     IMPRESSION: Blood per ileostomy,  resolved  -EGD  03/25/23 with band ligation x 4, remains on octreotide Altered mental status  -Ammonia wnl 03/26/23 Streptococcus anginosis bacteremia  -ID following Cirrhosis secondary to EtOH (recent presentation to ED on 01/20/23 with intoxication, positive alcohol level on presentation 03/23/23)  -Decompensated by esophageal varices s/p EGD above  -Decompensated by ascites, last paracentesis appears 05/19/2021 removed 1.3 L  -Decompensated by hepatic encephalopathy (multiple presentations in 2023 for HE)  -05/12/2021 HBsAg (-), HCV Ab (-), HAV Ab IgM (-)  -Follows with [facility], seen in office 07/16/22 and was abstinent from EtOH x 1 yr, seen in office on 08/31/22 for findings of liver mass noted on Abd [name] 06/2022 Colonoscopy 06/23/2021 with large friable ascending colon mass concerning for probable adenocarcinoma (path: well differentiated invasive adenocarcinoma) -History ascending colon polyp high grade dysplasia on COL 01/20/2019  -S/p right hemicolectomy and appendectomy on 10/25/2021   PLAN: -Suspect altered mental status multifactorial secondary to esophageal variceal bleeding & bacteremia & iatrogenic (Precedex), does have history of hepatic encephalopathy, would continue Rifaximin 550 mg PO Q12Hr and add lactulose 10 gm PO BID -Check limited abdominal ultrasound for ascites, if positive will request diagnostic paracentesis given bacteremia -Ok to wean octreotide, set to expire tomorrow at 1900 -Follow up infectious workup with ECHO -Would benefit from iron supplementation for portal hypertensive gastropathy treatment, though will defer in setting of infection -Would benefit from triple phase CT imaging of abdomen to characterize liver mass that was seen on ultrasound imaging in January 2024, though not seen on CT angiogram testing 03/23/23 and 03/26/23 -Complete cessation from EtOH -[facility] will follow   LOS: 6 days   Liliane Shi, [facility]  Gastroenterology

## 2023-03-29 NOTE — Progress Notes (Signed)
NAME:  Martin Anthony, MRN:  [id number], DOB:  07-07-1965, LOS: 6 ADMISSION DATE:  03/23/2023, CONSULTATION DATE:  03/26/23 REFERRING MD:  Dr Mal Misty, CHIEF COMPLAINT:  agitated delirium, anemia   History of Present Illness:  57 year old man with history of alcoholism and alcoholic cirrhosis with portal hypertension and varices, CKD stage II, colon cancer status post hemicolectomy.  He was admitted 10/8 with abdominal pain and blood in his ileostomy.  Found to have acute anemia requiring transfusion.  He underwent EGD 10/10 with grade 3 esophageal varices that were banded, portal hypertensive gastropathy.  Continued on octreotide and IV Protonix.  He had recurrent GI bleeding postprocedure and a CT angio did not show any clear source.  He drinks about 1 pint of alcohol daily, began to show evidence for agitated delirium on 10/10 with increasing CIWA scores.  A Precedex infusion was started early 10/11.  Course further complicated by acute on chronic renal failure, SCr 2.56  Hgb 7.5 > 6.4 > 4.2 (question spurious) > 7.0.  PRBC ordered this 10/11 after the 7.0 obtained.  INR 1.8  Pertinent  Medical History   Past Medical History:  Diagnosis Date   Anxiety    Ascites due to alcoholic cirrhosis (HCC)    Cirrhosis with alcoholism (HCC)    Depression    Diabetes mellitus without complication (HCC)    GERD (gastroesophageal reflux disease)    Gout    Hyperlipidemia    Hypertension    Primary adenocarcinoma of ascending colon (HCC)    Substance abuse (HCC)    alcoholism    Type 2 diabetes mellitus with diabetic neuropathy (HCC)     Significant Hospital Events: Including procedures, antibiotic start and stop dates in addition to other pertinent events   Blood cultures 10/11 2 of 2 >> GPC chains, strep species >>   Interim History / Subjective:  Norepinephrine 4 > 2 Precedex 0.7 > 0.4 SCr 2.56 > 2.21 > 2.29 I/O+ 3.7 L total, urine output 995cc last 24 hours Blood cultures positive GPC  chains, ceftriaxone started 10/12     Objective   Blood pressure (!) 98/48, pulse (!) 51, temperature (!) 97.5 F (36.4 C), temperature source Oral, resp. rate 13, height 6\' 4"  (1.93 m), weight 89.9 kg, SpO2 99%.        Intake/Output Summary (Last 24 hours) at 03/29/2023 0913 Last data filed at 03/29/2023 0800 Gross per 24 hour  Intake 1348.75 ml  Output 1220 ml  Net 128.75 ml   Filed Weights   03/27/23 0441 03/28/23 0500 03/29/23 0500  Weight: 88.5 kg 90.1 kg 89.9 kg    Examination: Dex 1> 0.7 General:  AoC ill appearing thin male lying in bed in NAD HEENT: MM pink/dry, pupils 2/r, + icterus Neuro: sedated, awakens to verbal, mumbled speech, follow simple commands, oriented to person, MAE weakly CV: rr, SB, no murmur PULM:  non labored, clear anteriorly, on room air GI: soft, bs+, NT, ostomy pink, minimal dark brown output, ND, foley  Extremities: warm/dry, no LE edema  Skin: jaundiced   Afebrile Labs reviewed> H/H 8.6/ 26.9, plts 106> 94, WBC 3.5, bicarb 19, BUN/ sCr 52/ 2.29> 51/2.06,  UOP 1.1L /24hrs Net +3.9  Resolved Hospital Problem list     Assessment & Plan:   Acute upper GI bleeding due to esophageal varices and portal gastropathy.  Underwent banding 10/10 with apparent eradication of the varices. Coagulapathy Acute on chronic Thrombocytopenia  - [facility] following, appreciate assistance - likely d/c  octreotide today and start lactulose and xifaxan when able to tolerate POs - PPI BID - H/H remains stable, trend CBC, transfuse for Hgb < 7 or hemodynamically significant bleeding - CTA if evidence of rebleed - outpt workup for possible TIPs   Acute agitated delirium due to alcohol withdrawal - reported last drink 10/7 per ER notes - cont to minimize precedex/ trend CIWA, if unable to wean off precedex, consider phenobarbital taper but day 6 off ETOH, should be minimizing - thiamine, folate - aspiration precautions  Shock, hemorrhagic and septic  shock, sedating meds likely contributing, possible superimposed on vasoplegia from his chronic liver disease.  Strep Anginosis bacteremia  - H/H remains stable - weaning NE for MAP goal > 65, on 1 mcg and nearly off.  Consider adding midodrine if unable to wean  - cont ceftriaxone for bacteremia  - echo ordered  - cont to minimize sedating meds   Acute on chronic stage II renal failure, likely ATN from hypoperfusion Non-anion gap metabolic acidosis, improving - UOP stable, sCr and bicarb improving - d/c foley when able - trend renal indices  - strict I/Os, daily wts - avoid nephrotoxins, renal dose meds, hemodynamic support as above  Cirrhosis with ascites, esophageal varices and portal hypertension.   - cont to hold nadolol, carvedilol, spironolactone, Lasix till off pressors - Discriminant factor 35.9, not currently on steroids.   -Follow LFT and INR intermittently  History colon cancer - f/u outpt  Depression - cont holding Zoloft, restart when able to take PO's safely    At risk for malnutrition - maximize nutrition when mental status improved   Best Practice (right click and "Reselect all SmartList Selections" daily)   Diet/type: NPO DVT prophylaxis: SCD GI prophylaxis: PPI Lines: N/A Foley:  yes, d/c soon Code Status:  full code Last date of multidisciplinary goals of care discussion [pending]  Pending 10/14, no family at bedside.  Labs   CBC: Recent Labs  Lab 03/23/23 1930 03/23/23 1946 03/26/23 0424 03/26/23 0947 03/26/23 1046 03/26/23 2133 03/27/23 0253 03/27/23 1557 03/28/23 0011 03/29/23 0246  WBC 4.2   < > 5.6 8.5  --   --  9.0  --  5.8 3.5*  NEUTROABS 3.4  --   --   --   --   --   --   --   --   --   HGB 7.8*   < > 6.4* 4.2*   < > 8.8* 8.8* 8.4* 8.9* 8.6*  HCT 23.8*   < > 20.3* 13.0*   < > 27.4* 27.1* 27.4* 28.1* 26.9*  MCV 108.7*   < > 110.3* 106.6*  --   --  103.0*  --  104.9* 105.5*  PLT 89*   < > 79* 106*  --   --  123*  --  106* 94*   <  > = values in this interval not displayed.    Basic Metabolic Panel: Recent Labs  Lab 03/24/23 0055 03/25/23 0014 03/25/23 2252 03/26/23 0321 03/26/23 0947 03/27/23 0253 03/28/23 0011 03/29/23 0246  NA 137   < > 133* 132* 131* 134* 133* 135  K 4.3   < > 4.5 4.7 4.4 4.3 4.2 4.0  CL 111   < > 110 108 107 110 107 111  CO2 20*   < > 18* 16* 16* 16* 16* 19*  GLUCOSE 152*   < > 148* 137* 198* 152* 149* 150*  BUN 23*   < > 37* 38* 39* 44*  52* 51*  CREATININE 1.67*   < > 2.50* 1.63* 2.56* 2.21* 2.29* 2.06*  CALCIUM 7.7*   < > 6.8* 6.7* 6.5* 6.6* 6.4* 6.8*  MG 1.8  --  1.7  --   --   --  1.8 2.0  PHOS 3.1  --   --   --   --   --  3.6 3.1   < > = values in this interval not displayed.   GFR: Estimated Creatinine Clearance: 49.7 mL/min (A) (by C-G formula based on SCr of 2.06 mg/dL (H)). Recent Labs  Lab 03/26/23 0947 03/27/23 0253 03/28/23 0011 03/29/23 0246  WBC 8.5 9.0 5.8 3.5*  LATICACIDVEN 1.2 1.0  --   --     Liver Function Tests: Recent Labs  Lab 03/25/23 0014 03/26/23 0321 03/26/23 0947 03/27/23 0253 03/28/23 0011  AST 70* 79* 73* 61* 60*  ALT 23 24 23 26 24   ALKPHOS 111 82 80 94 87  BILITOT 6.0* 8.8* 10.3* 12.4* 10.9*  PROT 5.3* 4.9* 4.8* 5.3* 5.0*  ALBUMIN 2.2* 2.1* 2.0* 2.2* 2.0*   No results for input(s): "LIPASE", "AMYLASE" in the last 168 hours. Recent Labs  Lab 03/26/23 0947  AMMONIA 20    ABG    Component Value Date/Time   PHART 7.417 05/12/2021 0608   PCO2ART 20.5 (L) 05/12/2021 0608   PO2ART 127 (H) 05/12/2021 0608   HCO3 12.9 (L) 05/12/2021 0608   TCO2 17 (L) 03/23/2023 1946   ACIDBASEDEF 9.6 (H) 05/12/2021 0608   O2SAT 97.9 05/12/2021 0608     Coagulation Profile: Recent Labs  Lab 03/23/23 1930 03/26/23 0947 03/27/23 0253  INR 1.6* 1.8* 1.8*    Cardiac Enzymes: No results for input(s): "CKTOTAL", "CKMB", "CKMBINDEX", "TROPONINI" in the last 168 hours.  HbA1C: Hgb A1c MFr Bld  Date/Time Value Ref Range Status  05/12/2021  05:22 AM 5.8 (H) 4.8 - 5.6 % Final    Comment:    (NOTE)         Prediabetes: 5.7 - 6.4         Diabetes: >6.4         Glycemic control for adults with diabetes: <7.0   01/18/2019 11:00 AM 5.3 4.8 - 5.6 % Final    Comment:    (NOTE) Pre diabetes:          5.7%-6.4% Diabetes:              >6.4% Glycemic control for   <7.0% adults with diabetes     CBG: No results for input(s): "GLUCAP" in the last 168 hours.    Critical care time: 33 min       Posey Boyer, MSN, AG-ACNP-BC [facility] 03/29/2023, 9:13 AM  See Amion for pager If no response to pager , please call [phone] until 7pm After 7:00 pm call Elink  [phone]

## 2023-03-30 DIAGNOSIS — F10931 Alcohol use, unspecified with withdrawal delirium: Secondary | ICD-10-CM | POA: Insufficient documentation

## 2023-03-30 DIAGNOSIS — R7881 Bacteremia: Secondary | ICD-10-CM | POA: Diagnosis not present

## 2023-03-30 DIAGNOSIS — K7031 Alcoholic cirrhosis of liver with ascites: Secondary | ICD-10-CM

## 2023-03-30 DIAGNOSIS — N171 Acute kidney failure with acute cortical necrosis: Secondary | ICD-10-CM

## 2023-03-30 DIAGNOSIS — D696 Thrombocytopenia, unspecified: Secondary | ICD-10-CM | POA: Insufficient documentation

## 2023-03-30 DIAGNOSIS — F10939 Alcohol use, unspecified with withdrawal, unspecified: Secondary | ICD-10-CM

## 2023-03-30 DIAGNOSIS — N182 Chronic kidney disease, stage 2 (mild): Secondary | ICD-10-CM

## 2023-03-30 DIAGNOSIS — R579 Shock, unspecified: Secondary | ICD-10-CM

## 2023-03-30 DIAGNOSIS — K922 Gastrointestinal hemorrhage, unspecified: Secondary | ICD-10-CM | POA: Diagnosis not present

## 2023-03-30 DIAGNOSIS — B955 Unspecified streptococcus as the cause of diseases classified elsewhere: Secondary | ICD-10-CM

## 2023-03-30 DIAGNOSIS — I8500 Esophageal varices without bleeding: Secondary | ICD-10-CM | POA: Insufficient documentation

## 2023-03-30 LAB — BODY FLUID CELL COUNT WITH DIFFERENTIAL
Lymphs, Fluid: 17 %
Monocyte-Macrophage-Serous Fluid: 68 % (ref 50–90)
Neutrophil Count, Fluid: 15 % (ref 0–25)
Total Nucleated Cell Count, Fluid: 140 uL (ref 0–1000)

## 2023-03-30 LAB — CBC WITH DIFFERENTIAL/PLATELET
Abs Immature Granulocytes: 0.01 10*3/uL (ref 0.00–0.07)
Basophils Absolute: 0 10*3/uL (ref 0.0–0.1)
Basophils Relative: 1 %
Eosinophils Absolute: 0.1 10*3/uL (ref 0.0–0.5)
Eosinophils Relative: 2 %
HCT: 21.7 % — ABNORMAL LOW (ref 39.0–52.0)
Hemoglobin: 7.2 g/dL — ABNORMAL LOW (ref 13.0–17.0)
Immature Granulocytes: 0 %
Lymphocytes Relative: 10 %
Lymphs Abs: 0.3 10*3/uL — ABNORMAL LOW (ref 0.7–4.0)
MCH: 34 pg (ref 26.0–34.0)
MCHC: 33.2 g/dL (ref 30.0–36.0)
MCV: 102.4 fL — ABNORMAL HIGH (ref 80.0–100.0)
Monocytes Absolute: 0.4 10*3/uL (ref 0.1–1.0)
Monocytes Relative: 13 %
Neutro Abs: 2.1 10*3/uL (ref 1.7–7.7)
Neutrophils Relative %: 74 %
Platelets: 75 10*3/uL — ABNORMAL LOW (ref 150–400)
RBC: 2.12 MIL/uL — ABNORMAL LOW (ref 4.22–5.81)
RDW: 18.8 % — ABNORMAL HIGH (ref 11.5–15.5)
WBC: 2.9 10*3/uL — ABNORMAL LOW (ref 4.0–10.5)
nRBC: 0 % (ref 0.0–0.2)

## 2023-03-30 LAB — CULTURE, BLOOD (ROUTINE X 2): Special Requests: ADEQUATE

## 2023-03-30 LAB — CBC
HCT: 23 % — ABNORMAL LOW (ref 39.0–52.0)
Hemoglobin: 7.2 g/dL — ABNORMAL LOW (ref 13.0–17.0)
MCH: 33 pg (ref 26.0–34.0)
MCHC: 31.3 g/dL (ref 30.0–36.0)
MCV: 105.5 fL — ABNORMAL HIGH (ref 80.0–100.0)
Platelets: 70 10*3/uL — ABNORMAL LOW (ref 150–400)
RBC: 2.18 MIL/uL — ABNORMAL LOW (ref 4.22–5.81)
RDW: 18.6 % — ABNORMAL HIGH (ref 11.5–15.5)
WBC: 2.2 10*3/uL — ABNORMAL LOW (ref 4.0–10.5)
nRBC: 0 % (ref 0.0–0.2)

## 2023-03-30 LAB — PROTIME-INR
INR: 1.9 — ABNORMAL HIGH (ref 0.8–1.2)
Prothrombin Time: 22.3 s — ABNORMAL HIGH (ref 11.4–15.2)

## 2023-03-30 LAB — COMPREHENSIVE METABOLIC PANEL
ALT: 23 U/L (ref 0–44)
AST: 64 U/L — ABNORMAL HIGH (ref 15–41)
Albumin: 2.6 g/dL — ABNORMAL LOW (ref 3.5–5.0)
Alkaline Phosphatase: 78 U/L (ref 38–126)
Anion gap: 7 (ref 5–15)
BUN: 47 mg/dL — ABNORMAL HIGH (ref 6–20)
CO2: 18 mmol/L — ABNORMAL LOW (ref 22–32)
Calcium: 7.1 mg/dL — ABNORMAL LOW (ref 8.9–10.3)
Chloride: 109 mmol/L (ref 98–111)
Creatinine, Ser: 2.11 mg/dL — ABNORMAL HIGH (ref 0.61–1.24)
GFR, Estimated: 36 mL/min — ABNORMAL LOW (ref >60)
Glucose, Bld: 120 mg/dL — ABNORMAL HIGH (ref 70–99)
Potassium: 3.7 mmol/L (ref 3.5–5.1)
Sodium: 134 mmol/L — ABNORMAL LOW (ref 135–145)
Total Bilirubin: 10.1 mg/dL — ABNORMAL HIGH (ref 0.3–1.2)
Total Protein: 5.3 g/dL — ABNORMAL LOW (ref 6.5–8.1)

## 2023-03-30 LAB — ALBUMIN, PLEURAL OR PERITONEAL FLUID: Albumin, Fluid: <1.5 g/dL

## 2023-03-30 LAB — PROTEIN, PLEURAL OR PERITONEAL FLUID: Total protein, fluid: <3 g/dL

## 2023-03-30 LAB — HEMOGLOBIN A1C
Hgb A1c MFr Bld: 5 % (ref 4.8–5.6)
Mean Plasma Glucose: 96.8 mg/dL

## 2023-03-30 LAB — LACTIC ACID, PLASMA: Lactic Acid, Venous: 1.1 mmol/L (ref 0.5–1.9)

## 2023-03-30 LAB — LIPASE, BLOOD: Lipase: 174 U/L — ABNORMAL HIGH (ref 11–51)

## 2023-03-30 MED ORDER — SERTRALINE HCL 50 MG PO TABS
50.0000 mg | ORAL_TABLET | Freq: Every day | ORAL | Status: DC
  Administered 2023-03-30 – 2023-04-07 (×9): 50 mg via ORAL
  Filled 2023-03-30 (×9): qty 1

## 2023-03-30 MED ORDER — GABAPENTIN 100 MG PO CAPS
100.0000 mg | ORAL_CAPSULE | Freq: Two times a day (BID) | ORAL | Status: DC
  Administered 2023-03-30 – 2023-04-02 (×7): 100 mg via ORAL
  Filled 2023-03-30 (×7): qty 1

## 2023-03-30 MED ORDER — MIDODRINE HCL 5 MG PO TABS
15.0000 mg | ORAL_TABLET | Freq: Three times a day (TID) | ORAL | Status: DC
  Administered 2023-03-30 – 2023-04-01 (×8): 15 mg via ORAL
  Filled 2023-03-30 (×8): qty 3

## 2023-03-30 MED ORDER — HYDROMORPHONE HCL 1 MG/ML IJ SOLN
0.5000 mg | INTRAMUSCULAR | Status: DC | PRN
  Administered 2023-03-30: 1 mg via INTRAVENOUS
  Administered 2023-03-30: 0.5 mg via INTRAVENOUS
  Administered 2023-03-30 – 2023-03-31 (×4): 1 mg via INTRAVENOUS
  Filled 2023-03-30 (×6): qty 1

## 2023-03-30 MED ORDER — FOLIC ACID 1 MG PO TABS
1.0000 mg | ORAL_TABLET | Freq: Every day | ORAL | Status: DC
  Administered 2023-03-30 – 2023-04-08 (×10): 1 mg via ORAL
  Filled 2023-03-30 (×10): qty 1

## 2023-03-30 MED ORDER — THIAMINE MONONITRATE 100 MG PO TABS
100.0000 mg | ORAL_TABLET | Freq: Every day | ORAL | Status: DC
  Administered 2023-03-30 – 2023-04-08 (×10): 100 mg via ORAL
  Filled 2023-03-30 (×11): qty 1

## 2023-03-30 NOTE — Evaluation (Signed)
Occupational Therapy Evaluation Patient Details Name: Martin Anthony MRN: [id number] DOB: 1965-07-28 Today's Date: 03/30/2023   History of Present Illness Patient is a 57 year old male who presented with blood in ileostomy and abdominal pain. Patient was admitted with acute anemia and AMS. Patient underwent upper endoscopy on 10/10 with band ligation. Patient suffered recurrent GI hemorrhaging with associated shock with patient transitioned to ICU with vasopressor support. PMH: EtOH abuse, cirrhosis,   Clinical Impression   Patient is a 58 year old male who was admitted for above. Patient reported living at home in multi level house with dog independently. Currently, patient has increased pain in abdomen and LUE leading to patient being max +2 for bed mobility with min A +2 to navigate into bathroom with increased time. Patient plans to d/c back home with friend support at time of d/c. Patient reported hearing his dog during session and asking if there were dogs in the hospital. At this time there were no dogs on ICU unit.  Patient will benefit from continued inpatient follow up therapy, <3 hours/day       If plan is discharge home, recommend the following: Two people to help with walking and/or transfers;A lot of help with bathing/dressing/bathroom;Assistance with cooking/housework;Direct supervision/assist for medications management;Assist for transportation;Help with stairs or ramp for entrance;Direct supervision/assist for financial management;Assistance with feeding    Functional Status Assessment  Patient has had a recent decline in their functional status and demonstrates the ability to make significant improvements in function in a reasonable and predictable amount of time.  Equipment Recommendations  None recommended by OT       Precautions / Restrictions Precautions Precautions: Fall Precaution Comments: abdominal, ostomy Restrictions Weight Bearing Restrictions: No       Mobility Bed Mobility Overal bed mobility: Needs Assistance Bed Mobility: Supine to Sit, Sit to Supine     Supine to sit: Max assist, +2 for physical assistance, +2 for safety/equipment Sit to supine: +2 for physical assistance, Max assist, +2 for safety/equipment   General bed mobility comments: cues for log rolling physical A to advance BLE out of bed and into bed with A for control of trunk positioning.            Balance Overall balance assessment: Mild deficits observed, not formally tested         ADL either performed or assessed with clinical judgement   ADL Overall ADL's : Needs assistance/impaired Eating/Feeding: Moderate assistance;Sitting Eating/Feeding Details (indicate cue type and reason): patient was sitting in bed attempting to self feed earlier on this date with difficulty with holding utensils and bringing to mouth. patient declined to use red foam reporting : i can eat normally" but having increased difficulty with getting utensil to mouth. patient decliend to have nursing or therapy assist with getting intake on this date. Grooming: Maximal assistance;Bed level   Upper Body Bathing: Bed level;Maximal assistance   Lower Body Bathing: Bed level;Total assistance   Upper Body Dressing : Bed level;Maximal assistance   Lower Body Dressing: Bed level;Total assistance   Toilet Transfer: Minimal assistance;+2 for physical assistance;+2 for safety/equipment;Ambulation;Rolling walker (2 wheels) Toilet Transfer Details (indicate cue type and reason): with increased time with patient asking for therapy to "push down on the walker so i know its locked" patient was educated that rolling walker dont have locks but therapy was going to hold it steady for him. patient reported " ive had them unlock on me before" then reported he has never had a  walker before. Toileting- Clothing Manipulation and Hygiene: Maximal assistance;Sit to/from stand Toileting - Clothing  Manipulation Details (indicate cue type and reason): min A for hygiene seated with A as well to collect toilet tissue from dispender while seated . TD to compelte hygiene in standing.             Vision   Vision Assessment?: No apparent visual deficits            Pertinent Vitals/Pain Pain Assessment Pain Assessment: 0-10 Pain Score: 8  Pain Location: abdomen with movement Pain Descriptors / Indicators: Grimacing, Moaning, Constant Pain Intervention(s): Limited activity within patient's tolerance, Monitored during session, Repositioned     Extremity/Trunk Assessment Upper Extremity Assessment Upper Extremity Assessment: LUE deficits/detail;Difficult to assess due to impaired cognition LUE Deficits / Details: patient reported having fallen down stairs 6 months prior to admission and he tore his rotator cuff. stated that insurance wont pay for surgery. was able to place arm up on walker to help get out of bed. did not formally test MMT with patients pain. elbow hand and wrist appeared to be WFL LUE: Unable to fully assess due to pain   Lower Extremity Assessment Lower Extremity Assessment: Defer to PT evaluation          Cognition Arousal: Alert Behavior During Therapy: Flat affect Overall Cognitive Status: Difficult to assess       General Comments: patient reported he is "disabled with encephalopathy" at baseline. patient was particular about all movements during session.                Home Living Family/patient expects to be discharged to:: Private residence Living Arrangements: Alone Available Help at Discharge: Family;Friend(s);Available PRN/intermittently Type of Home: House Home Access: Stairs to enter [employer] of Steps: 4 Entrance Stairs-Rails: Right;Left          Prior Functioning/Environment Prior Level of Function : Independent/Modified Independent               ADLs Comments: reported he lives at home with his dog.         OT Problem List: Decreased activity tolerance;Impaired balance (sitting and/or standing);Decreased coordination;Decreased safety awareness;Decreased knowledge of precautions;Decreased knowledge of use of DME or AE;Impaired UE functional use;Pain      OT Treatment/Interventions: Self-care/ADL training;Therapeutic exercise;DME and/or AE instruction;Therapeutic activities;Patient/family education;Balance training    OT Goals(Current goals can be found in the care plan section) Acute Rehab OT Goals Patient Stated Goal: to get home to dog OT Goal Formulation: Patient unable to participate in goal setting Time For Goal Achievement: 04/13/23 Potential to Achieve Goals: Fair  OT Frequency: Min 1X/week    Co-evaluation PT/OT/SLP Co-Evaluation/Treatment: Yes Reason for Co-Treatment: To address functional/ADL transfers;Necessary to address cognition/behavior during functional activity PT goals addressed during session: Mobility/safety with mobility OT goals addressed during session: ADL's and self-care      AM-PAC OT "6 Clicks" Daily Activity     Outcome Measure Help from another person eating meals?: A Lot Help from another person taking care of personal grooming?: A Lot Help from another person toileting, which includes using toliet, bedpan, or urinal?: Total Help from another person bathing (including washing, rinsing, drying)?: Total Help from another person to put on and taking off regular upper body clothing?: Total Help from another person to put on and taking off regular lower body clothing?: Total 6 Click Score: 8   End of Session Equipment Utilized During Treatment: Gait belt;Rolling walker (2 wheels) Nurse Communication: Other (comment) (patient  declining A with self feeding.)  Activity Tolerance: Patient tolerated treatment well Patient left: in bed;with call bell/phone within reach;with bed alarm set;Other (comment) (MD in room)  OT Visit Diagnosis: Unsteadiness on feet  (R26.81);Other abnormalities of gait and mobility (R26.89);Pain                Time: 5621-3086 OT Time Calculation (min): 22 min Charges:  OT General Charges $OT Visit: 1 Visit OT Evaluation $OT Eval Moderate Complexity: 1 Mod  Anjanae Woehrle OTR/L, MS Acute Rehabilitation Department Office# [phone]   Selinda Flavin 03/30/2023, 5:03 PM

## 2023-03-30 NOTE — Plan of Care (Signed)
  Problem: Education: Goal: Knowledge of General Education information will improve Description: Including pain rating scale, medication(s)/side effects and non-pharmacologic comfort measures 03/30/2023 0542 by Krystal Eaton, RN Outcome: Not Progressing 03/30/2023 0541 by Krystal Eaton, RN Outcome: Progressing   Problem: Health Behavior/Discharge Planning: Goal: Ability to manage health-related needs will improve 03/30/2023 0542 by Krystal Eaton, RN Outcome: Not Progressing 03/30/2023 0541 by Krystal Eaton, RN Outcome: Progressing

## 2023-03-30 NOTE — Assessment & Plan Note (Addendum)
Acute blood loss anemia S/p EGD on 03/25/23 with banding x 4 Recurrent bleeding on 10/11 but CTA showed only wall thickening and inflammatory change in duodenum, no extravasation.    Completed octreotide.  GI have signed off. - Continue PPI

## 2023-03-30 NOTE — Assessment & Plan Note (Signed)
BP normal today - Hold diuretics  - Resume nadolol - Hold midodrine

## 2023-03-30 NOTE — Assessment & Plan Note (Addendum)
Baseline hemoglobin of 9-11.  See above Hgb stable - Transfusion threshold 7 g/dL

## 2023-03-30 NOTE — Assessment & Plan Note (Signed)
Continue Zoloft 

## 2023-03-30 NOTE — Procedures (Addendum)
Paracentesis Procedure Note  Martin Anthony  [id number]  [birth year]  Date:03/30/23  Time:10:37 AM   Provider Performing:Brooke Lodema Hong    Procedure: diagnostic Paracentesis with imaging guidance (74259)  Indication(s) Ascites  Consent Risks of the procedure as well as the alternatives and risks of each were explained to the patient and/or caregiver.  Consent for the procedure was obtained and is signed in the bedside chart  Anesthesia Topical only with 1% lidocaine    Time Out Verified patient identification, verified procedure, site/side was marked, verified correct patient position, special equipment/implants available, medications/allergies/relevant history reviewed, required imaging and test results available.   Sterile Technique Maximal sterile technique including full sterile barrier drape, hand hygiene, sterile gown, sterile gloves, mask, hair covering, sterile ultrasound probe cover (if used).   Procedure Description Ultrasound used to identify appropriate peritoneal anatomy for placement and overlying skin marked.  Area of drainage cleaned and draped in sterile fashion on LLQ. Lidocaine was used to anesthetize the skin and subcutaneous tissue.  40 cc's of yellow appearing fluid was drained. Catheter then removed and bandaid applied to site.   Complications/Tolerance None; patient tolerated the procedure well.   EBL N/a   Specimen(s) Peritoneal fluid     Martin Boyer, MSN, AG-ACNP-BC [facility] 03/30/2023, 10:38 AM  See Amion for pager If no response to pager , please call [phone] until 7pm After 7:00 pm call Elink  [phone]

## 2023-03-30 NOTE — Progress Notes (Signed)
NAME:  Martin Anthony, MRN:  [id number], DOB:  March 18, 1966, LOS: 7 ADMISSION DATE:  03/23/2023, CONSULTATION DATE:  03/26/23 REFERRING MD:  Dr Mal Misty, CHIEF COMPLAINT:  agitated delirium, anemia   History of Present Illness:  57 year old man with history of alcoholism and alcoholic cirrhosis with portal hypertension and varices, CKD stage II, colon cancer status post hemicolectomy.  He was admitted 10/8 with abdominal pain and blood in his ileostomy.  Found to have acute anemia requiring transfusion.  He underwent EGD 10/10 with grade 3 esophageal varices that were banded, portal hypertensive gastropathy.  Continued on octreotide and IV Protonix.  He had recurrent GI bleeding postprocedure and a CT angio did not show any clear source.  He drinks about 1 pint of alcohol daily, began to show evidence for agitated delirium on 10/10 with increasing CIWA scores.  A Precedex infusion was started early 10/11.  Course further complicated by acute on chronic renal failure, SCr 2.56  Hgb 7.5 > 6.4 > 4.2 (question spurious) > 7.0.  PRBC ordered this 10/11 after the 7.0 obtained.  INR 1.8  Pertinent  Medical History   Past Medical History:  Diagnosis Date   Anxiety    Ascites due to alcoholic cirrhosis (HCC)    Cirrhosis with alcoholism (HCC)    Depression    Diabetes mellitus without complication (HCC)    GERD (gastroesophageal reflux disease)    Gout    Hyperlipidemia    Hypertension    Primary adenocarcinoma of ascending colon (HCC)    Substance abuse (HCC)    alcoholism    Type 2 diabetes mellitus with diabetic neuropathy (HCC)     Significant Hospital Events: Including procedures, antibiotic start and stop dates in addition to other pertinent events   Blood cultures 10/11 2 of 2 >> GPC chains, strep species >>   Interim History / Subjective:  Lots of epigastric pain. Bps remain borderline.  Objective   Blood pressure (!) 121/59, pulse 71, temperature 98 F (36.7 C), temperature source  Oral, resp. rate 18, height 6\' 4"  (1.93 m), weight 89.7 kg, SpO2 100%.        Intake/Output Summary (Last 24 hours) at 03/30/2023 0711 Last data filed at 03/30/2023 0600 Gross per 24 hour  Intake 559.88 ml  Output 1300 ml  Net -740.12 ml   Filed Weights   03/28/23 0500 03/29/23 0500 03/30/23 0500  Weight: 90.1 kg 89.9 kg 89.7 kg    Examination: Jaundiced no distress Exquisite epigastric tenderness Heart sounds regular Ext warm Ostomy looks pink/okay Moves to command More oriented/awake than yesterday  Bili improved All cell lines are down Cr up but stable  Resolved Hospital Problem list     Assessment & Plan:   Acute upper GI bleeding due to esophageal varices and portal gastropathy.  Underwent banding 10/10 with apparent eradication of the varices.  H/H down a bit, BUN downtrending arguing against ongoing active bleeding.  Ongoing epigastric pain. Coagulapathy of cirrhosis Pancytopenia- chronic issue EtOH w/d- improved; last drink ?10/7 Ongoing vasoplegia Acute on chronic stage II renal failure,  History colon cancer Depression Strep Anginosis bacteremia- check echo; TEE contraindicated in setting of varices; if any questionable findings would treat empirically for IE.   - Check lipase - Continue albumin - Hopefully can avoid restarting levophed - CBC recheck around noon - Octreotide/PPI per GI team - f/u echo - ceftriaxone, duration TBD - diagnostic paracentesis today  Best Practice (right click and "Reselect all SmartList Selections" daily)  Diet/type: regular DVT prophylaxis: SCD GI prophylaxis: PPI Lines: N/A Foley:  yes, d/c soon Code Status:  full code Last date of multidisciplinary goals of care discussion [pending]  Myrla Halsted MD PCCM

## 2023-03-30 NOTE — Assessment & Plan Note (Signed)
Baseline SCr appears to be ~ 1.3 Cr here 1.7 on admission, trended up to high 2s.   Cr stable today post-paracentesis - Trend Cr - Avoid hypotension and nephrotoxins

## 2023-03-30 NOTE — Assessment & Plan Note (Signed)
Acute metabolic encephalopathy Hepatic encephalopathy Patient developed confusion/disorientation after transfer to ICU that was likely a combination of alcohol withdrawal delirium and hepatic encephalopathy.  Weaned off Precedex.  Received a few doses of phenobarbital, but considerably better 10/15 and so this was stopped.  CIWA last 24 hours have been 2-28, mostly lower.   - Continue CIWA scoring - Start taper - Continue thiamine and folate - Continue lactulose

## 2023-03-30 NOTE — Assessment & Plan Note (Addendum)
Strep anginosis 2/3 bottles from 10/11 TTE unremarkable.   - Continue Rocephin  - Follow repeat blood culture - Consult ID, appreciate cares - Follow paracentesis culture, so far no growth - Can transition to cefadroxil, 1g BID EOT 10/29

## 2023-03-30 NOTE — Assessment & Plan Note (Deleted)
ikely ATN from hypoperfusion Non-anion gap metabolic acidosis, improving - UOP stable, sCr and bicarb improving - d/c foley when able - trend renal indices  - strict I/Os, daily wts - avoid nephrotoxins, renal dose meds, hemodynamic support as above

## 2023-03-30 NOTE — Assessment & Plan Note (Addendum)
Hemorrhagic and possibly septic.  Superimposed on vasoplegia of chronic liver disease.   Echo unremarkable. - Continue midodrine

## 2023-03-30 NOTE — Assessment & Plan Note (Signed)
S/p hemicolectomy. Under observation

## 2023-03-30 NOTE — Progress Notes (Signed)
Progress Note    Martin Anthony   VQQ:[id number]  DOB: December 29, 1965  DOA: 03/23/2023     7 PCP: Nelwyn Salisbury, MD  Initial CC: GIB  Hospital Course: Martin Anthony is a 57 y.o. male with a history of alcoholism, cirrhosis, CKD stage II/III, portal hypertension, esophageal varices, colon cancer status post hemicolectomy.  Patient presented secondary to blood from ileostomy associated abdominal pain concerning for GI bleeding.  Patient found to have associated acute anemia requiring blood transfusion on admission.   [facility] gastroenterology was consulted for management. Patient underwent upper endoscopy with banding of varices. He suffered recurrent GI hemorrhaging with associated shock requiring vasopressor support and ICU management. During workup, blood cultures also found to be positive for Strep anginosus.  Alcohol withdrawal symptoms worsened requiring addition of Precedex infusion. PCCM consulted for persistent hypotension, complicated by agitation requiring Precedex. He stabilized off of Precedex and was transitioned to phenobarbital taper and Ativan as needed.  Interval History:  Still confused and lethargic when seen this morning.  Otherwise seems to be slowly improving in general but still scoring high on CIWA.  Assessment and Plan: * Acute GI bleeding - CTA negative for identification of source but was notable for inflammatory changes and wall-thickening  involving the second portion of the duodenum. - s/p EGD 10/10 with 4 bands placed - octreotide course in place - continue protonix - Hgb still not stable but will continue to trend  Streptococcal bacteremia - strep anginosis 2/3 bottles from 10/11 - on rocephin   Shock (HCC)-resolved as of 03/30/2023 - hemorrhagic and septic shock, sedating meds likely contributing, possible superimposed on vasoplegia from his chronic liver disease.  - s/p ICU stay with vasopressor support; now off - remains on midodrine; wean as able - echo  completed 10/14, negative for veg  Alcohol withdrawal (HCC) - s/p precedex in ICU - s/p phenobarb; now on ativan PRN - continue CIWA  Alcoholic cirrhosis of liver with ascites (HCC) - MELD Na of 24 on admission - s/p paracentesis on 10/15; follow up fluid studies  Acute renal failure superimposed on stage 2 chronic kidney disease (HCC) - patient has history of CKD2. Baseline creat ~ 1.3 - patient presents with increase in creat >0.3 mg/dL above baseline or creat increase >1.5x baseline presumed to have occurred within past 7 days PTA - creat 1.75 on admission - etiology suspected volume depletion and acute blood loss -Creatinine has peaked at 2.56 - Continue trending renal function   Anemia - Baseline hemoglobin of 9-11. Hemoglobin of 7.8 on admission.  - see GIB  Esophageal varices (HCC) - s/p EGD on 03/25/23 with banding x 4  Thrombocytopenia (HCC) - Due to chronic alcohol use and underlying cirrhosis - Platelets relatively stable - Continue trending  Primary adenocarcinoma of ascending colon (HCC) S/p hemicolectomy. Under observation   Type 2 diabetes mellitus with neurological complications (HCC) - CBG controlled - repeat A1C  Essential hypertension - Meds on hold in setting of recent hypotension and shock - Continue midodrine  Depression with anxiety - Continue Zoloft   Old records reviewed in assessment of this patient  Antimicrobials: Rocephin 03/27/2023 >> current  DVT prophylaxis:  SCDs Start: 03/23/23 2143   Code Status:   Code Status: Full Code  Mobility Assessment (Last 72 Hours)     Mobility Assessment     Row Name 03/27/23 2000           Does patient have an order for bedrest or is patient  medically unstable No - Continue assessment       What is the highest level of mobility based on the progressive mobility assessment? Level 4 (Walks with assist in room) - Balance while marching in place and cannot step forward and back - Complete        Is the above level different from baseline mobility prior to current illness? Yes - Recommend PT order                Barriers to discharge: none yet Disposition Plan:  Pending PT/OT evals Status is: Inpt  Objective: Blood pressure (!) 107/47, pulse 63, temperature 98.2 F (36.8 C), temperature source Oral, resp. rate 12, height 6\' 4"  (1.93 m), weight 89.7 kg, SpO2 100%.  Examination:  Physical Exam Constitutional:      Comments: Confused, slow mentation; NAD  HENT:     Head: Normocephalic and atraumatic.     Mouth/Throat:     Mouth: Mucous membranes are moist.  Eyes:     General: Scleral icterus present.     Extraocular Movements: Extraocular movements intact.  Cardiovascular:     Rate and Rhythm: Normal rate and regular rhythm.  Pulmonary:     Effort: Pulmonary effort is normal. No respiratory distress.     Breath sounds: Normal breath sounds. No wheezing.  Abdominal:     General: Bowel sounds are normal.     Palpations: Abdomen is soft.     Comments: Right inguinal hernia appreciated and nontender and reducible   Musculoskeletal:        General: No swelling. Normal range of motion.     Cervical back: Normal range of motion and neck supple.  Skin:    General: Skin is warm and dry.     Coloration: Skin is jaundiced.      Consultants:  GI  Procedures:  10/10: EGD  Data Reviewed: Results for orders placed or performed during the hospital encounter of 03/23/23 (from the past 24 hour(s))  CBC     Status: Abnormal   Collection Time: 03/30/23  3:07 AM  Result Value Ref Range   WBC 2.2 (L) 4.0 - 10.5 K/uL   RBC 2.18 (L) 4.22 - 5.81 MIL/uL   Hemoglobin 7.2 (L) 13.0 - 17.0 g/dL   HCT 69.6 (L) 29.5 - 28.4 %   MCV 105.5 (H) 80.0 - 100.0 fL   MCH 33.0 26.0 - 34.0 pg   MCHC 31.3 30.0 - 36.0 g/dL   RDW 13.2 (H) 44.0 - 10.2 %   Platelets 70 (L) 150 - 400 K/uL   nRBC 0.0 0.0 - 0.2 %  Comprehensive metabolic panel     Status: Abnormal   Collection Time: 03/30/23   3:07 AM  Result Value Ref Range   Sodium 134 (L) 135 - 145 mmol/L   Potassium 3.7 3.5 - 5.1 mmol/L   Chloride 109 98 - 111 mmol/L   CO2 18 (L) 22 - 32 mmol/L   Glucose, Bld 120 (H) 70 - 99 mg/dL   BUN 47 (H) 6 - 20 mg/dL   Creatinine, Ser 7.25 (H) 0.61 - 1.24 mg/dL   Calcium 7.1 (L) 8.9 - 10.3 mg/dL   Total Protein 5.3 (L) 6.5 - 8.1 g/dL   Albumin 2.6 (L) 3.5 - 5.0 g/dL   AST 64 (H) 15 - 41 U/L   ALT 23 0 - 44 U/L   Alkaline Phosphatase 78 38 - 126 U/L   Total Bilirubin 10.1 (H) 0.3 - 1.2 mg/dL  GFR, Estimated 36 (L) >60 mL/min   Anion gap 7 5 - 15  Protime-INR     Status: Abnormal   Collection Time: 03/30/23  3:07 AM  Result Value Ref Range   Prothrombin Time 22.3 (H) 11.4 - 15.2 seconds   INR 1.9 (H) 0.8 - 1.2  Lipase, blood     Status: Abnormal   Collection Time: 03/30/23  3:07 AM  Result Value Ref Range   Lipase 174 (H) 11 - 51 U/L  Lactic acid, plasma     Status: None   Collection Time: 03/30/23  7:55 AM  Result Value Ref Range   Lactic Acid, Venous 1.1 0.5 - 1.9 mmol/L  Protein, Peritoneal Fluid     Status: None   Collection Time: 03/30/23 10:39 AM  Result Value Ref Range   Total protein, fluid <3.0 g/dL   Fluid Type-FTP Peritoneal   Albumin, Peritoneal Fluid     Status: None   Collection Time: 03/30/23 10:39 AM  Result Value Ref Range   Albumin, Fluid <1.5 g/dL   Fluid Type-FALB Peritoneal     I have reviewed pertinent nursing notes, vitals, labs, and images as necessary. I have ordered labwork to follow up on as indicated.  I have reviewed the last notes from staff over past 24 hours. I have discussed patient's care plan and test results with nursing staff, CM/SW, and other staff as appropriate.  Time spent: Greater than 50% of the 55 minute visit was spent in counseling/coordination of care for the patient as laid out in the A&P.   LOS: 7 days   Lewie Chamber, MD [facility] 03/30/2023, 11:53 AM

## 2023-03-30 NOTE — Assessment & Plan Note (Addendum)
Alcoholic hepatitis Bleeding esophageal varices Hepatic encephalopathy Thrombocytopenia Ascites MELD Na of 24 on admission.  Steroids held due to bacteremia, variceal bleeding  Ascites better after para.  Cultures no growth so far.  SBP ruled out with para x2. MELDNA 17 last Feb, 24 on admission, >30 now. Not a transplant candidate with active alcohol use.  - Alcohol cessation - Lactulose - Avoid diuretics for now - Plan to resume nadolol if BP remains stable tomorrow

## 2023-03-30 NOTE — Evaluation (Signed)
Physical Therapy Evaluation Patient Details Name: Martin Anthony MRN: [id number] DOB: [birth year] Today's Date: 03/30/2023  History of Present Illness  Patient is a 57 year old male who presented with blood in ileostomy and abdominal pain. Patient was admitted with acute anemia and AMS. Patient underwent upper endoscopy on 10/10 with band ligation. Patient suffered recurrent GI hemorrhaging with associated shock with patient transitioned to ICU with vasopressor support. PMH: EtOH abuse, cirrhosis,  Clinical Impression    Patientt admitted with above diagnosis.  Pt currently with functional limitations due to the deficits listed below (see PT Problem List). Pt will benefit from acute skilled PT to increase their independence and safety with mobility to allow discharge.     The patient  did ambulate with much effort and 2 persons for safety x 20' x 2. Patient lives alone, reports may have assist at Dc. .skileed unless progresses to return home.        If plan is discharge home, recommend the following: A lot of help with bathing/dressing/bathroom;A lot of help with walking and/or transfers;Help with stairs or ramp for entrance   Can travel by private vehicle   No    Equipment Recommendations Rolling walker (2 wheels)  Recommendations for Other Services       Functional Status Assessment Patient has had a recent decline in their functional status and demonstrates the ability to make significant improvements in function in a reasonable and predictable amount of time.     Precautions / Restrictions Precautions Precautions: Fall Precaution Comments: abdominal, ostomy Restrictions Weight Bearing Restrictions: No      Mobility  Bed Mobility   Bed Mobility: Supine to Sit, Sit to Supine     Supine to sit: Max assist, +2 for physical assistance, +2 for safety/equipment Sit to supine: +2 for physical assistance, Max assist, +2 for safety/equipment   General bed mobility comments: cues  for log rolling physical A to advance BLE out of bed and into bed with A for control of trunk positioning.    Transfers Overall transfer level: Needs assistance Equipment used: Rolling walker (2 wheels) Transfers: Sit to/from Stand Sit to Stand: Mod assist, +2 safety/equipment                Ambulation/Gait Ambulation/Gait assistance: Mod assist, +2 safety/equipment Gait Distance (Feet): 20 Feet (x 2) Assistive device: Rolling walker (2 wheels) Gait Pattern/deviations: Step-through pattern, Decreased step length - right, Decreased step length - left       General Gait Details: cues for safety and position in RW  Stairs            Wheelchair Mobility     Tilt Bed    Modified Rankin (Stroke Patients Only)       Balance                                             Pertinent Vitals/Pain Pain Assessment Pain Assessment: Faces Pain Location: abdomen with movement Pain Descriptors / Indicators: Grimacing, Moaning, Constant    Home Living Family/patient expects to be discharged to:: Private residence Living Arrangements: Alone Available Help at Discharge: Family;Friend(s);Available PRN/intermittently Type of Home: House Home Access: Stairs to enter Entrance Stairs-Rails: Right;Left Entrance Stairs-Number of Steps: 4            Prior Function Prior Level of Function : Independent/Modified Independent  ADLs Comments: reported he lives at home with his dog.     Extremity/Trunk Assessment   Upper Extremity Assessment Upper Extremity Assessment: Defer to OT evaluation LUE Deficits / Details: patient reported having fallen down stairs 6 months prior to admission and he tore his rotator cuff. stated that insurance wont pay for surgery. was able to place arm up on walker to help get out of bed. did not formally test MMT with patients pain. elbow hand and wrist appeared to be WFL LUE: Unable to fully assess due to pain     Lower Extremity Assessment Lower Extremity Assessment: Generalized weakness       Communication      Cognition Arousal: Alert Behavior During Therapy: Flat affect Overall Cognitive Status: Difficult to assess                                 General Comments: patient reported he is "disabled with encephalopathy" at baseline. patient was particular about all movements during session.        General Comments      Exercises     Assessment/Plan    PT Assessment Patient needs continued PT services  PT Problem List Decreased strength;Decreased mobility;Decreased activity tolerance;Decreased cognition;Decreased balance;Decreased knowledge of use of DME;Pain;Decreased safety awareness       PT Treatment Interventions DME instruction;Therapeutic exercise;Gait training;Functional mobility training;Therapeutic activities;Patient/family education    PT Goals (Current goals can be found in the Care Plan section)  Acute Rehab PT Goals Patient Stated Goal: home yo his dog PT Goal Formulation: With patient Time For Goal Achievement: 04/13/23 Potential to Achieve Goals: Fair    Frequency Min 1X/week     Co-evaluation PT/OT/SLP Co-Evaluation/Treatment: Yes Reason for Co-Treatment: To address functional/ADL transfers;Necessary to address cognition/behavior during functional activity PT goals addressed during session: Mobility/safety with mobility OT goals addressed during session: ADL's and self-care       AM-PAC PT "6 Clicks" Mobility  Outcome Measure Help needed turning from your back to your side while in a flat bed without using bedrails?: A Little Help needed moving from lying on your back to sitting on the side of a flat bed without using bedrails?: A Little Help needed moving to and from a bed to a chair (including a wheelchair)?: A Little Help needed standing up from a chair using your arms (e.g., wheelchair or bedside chair)?: A Lot Help needed to walk  in hospital room?: A Lot Help needed climbing 3-5 steps with a railing? : Total 6 Click Score: 14    End of Session Equipment Utilized During Treatment: Gait belt Activity Tolerance: Patient tolerated treatment well;Patient limited by fatigue Patient left: in bed;with call bell/phone within reach;with bed alarm set Nurse Communication: Mobility status PT Visit Diagnosis: Unsteadiness on feet (R26.81);Muscle weakness (generalized) (M62.81);Pain    Time: 4098-1191 PT Time Calculation (min) (ACUTE ONLY): 31 min   Charges:   PT Evaluation $PT Eval Low Complexity: 1 Low   PT General Charges $$ ACUTE PT VISIT: 1 Visit         Blanchard Kelch PT Acute Rehabilitation Services Office [phone] Weekend [phone]   Rada Hay 03/30/2023, 6:19 PM

## 2023-03-30 NOTE — Assessment & Plan Note (Deleted)
-   s/p EGD on 03/25/23 with banding x 4

## 2023-03-30 NOTE — Assessment & Plan Note (Signed)
A1c 5%, well controlled off meds.  No SS corrections needed

## 2023-03-30 NOTE — Progress Notes (Signed)
[facility] Progress Note  SUBJECTIVE:   Interval history: Martin Anthony was seen and evaluated today at bedside. Was ambulating to bathroom with therapy when initially presented to room. Evaluated patient when he had returned to bed. He noted that he has diffuse abdominal pain. He noted having burning type foot pain for which he takes gabapentin in the outpatient setting. He noted last EtOH use was roughly 1 week ago, he noted that he is a ''social drinker". He has some shortness of breath, no chest pain.   Past Medical History:  Diagnosis Date   Anxiety    Ascites due to alcoholic cirrhosis (HCC)    Cirrhosis with alcoholism (HCC)    Depression    Diabetes mellitus without complication (HCC)    GERD (gastroesophageal reflux disease)    Gout    Hyperlipidemia    Hypertension    Primary adenocarcinoma of ascending colon (HCC)    Substance abuse (HCC)    alcoholism    Type 2 diabetes mellitus with diabetic neuropathy [facility])    Past Surgical History:  Procedure Laterality Date   BIOPSY  01/19/2019   Procedure: BIOPSY;  Surgeon: Jeani Hawking, MD;  Location: [facility];  Service: Endoscopy;;   COLON SURGERY  10/25/2021   right hemicolectomy with ostomy at [facility] in [place]   COLONOSCOPY N/A 01/19/2019   Procedure: COLONOSCOPY;  Surgeon: Jeani Hawking, MD;  Location: [facility];  Service: Endoscopy;  Laterality: N/A;   ESOPHAGEAL BANDING  03/25/2023   Procedure: ESOPHAGEAL BANDING;  Surgeon: Kerin Salen, MD;  Location: [facility];  Service: Gastroenterology;;   ESOPHAGOGASTRODUODENOSCOPY (EGD) WITH PROPOFOL N/A 01/19/2019   Procedure: ESOPHAGOGASTRODUODENOSCOPY (EGD) WITH PROPOFOL;  Surgeon: Jeani Hawking, MD;  Location: [facility];  Service: Endoscopy;  Laterality: N/A;   ESOPHAGOGASTRODUODENOSCOPY (EGD) WITH PROPOFOL N/A 03/25/2023   Procedure: ESOPHAGOGASTRODUODENOSCOPY (EGD) WITH PROPOFOL;  Surgeon: Kerin Salen, MD;  Location: [facility];  Service:  Gastroenterology;  Laterality: N/A;   HEMOSTASIS CLIP PLACEMENT  01/19/2019   Procedure: HEMOSTASIS CLIP PLACEMENT;  Surgeon: Jeani Hawking, MD;  Location: [facility];  Service: Endoscopy;;   HERNIA REPAIR     umbilical    ILEOSCOPY N/A 03/25/2023   Procedure: ILEOSCOPY THROUGH STOMA;  Surgeon: Kerin Salen, MD;  Location: [facility];  Service: Gastroenterology;  Laterality: N/A;   INGUINAL HERNIA REPAIR Bilateral    IR FLUORO GUIDE CV LINE RIGHT  05/13/2021   IR PARACENTESIS  05/19/2021   IR US GUIDE VASC ACCESS RIGHT  05/13/2021   POLYPECTOMY  01/19/2019   Procedure: POLYPECTOMY;  Surgeon: Jeani Hawking, MD;  Location: [facility];  Service: Endoscopy;;   Current Facility-Administered Medications  Medication Dose Route Frequency Provider Last Rate Last Admin   cefTRIAXone (ROCEPHIN) 2 g in sodium chloride 0.9 % 100 mL IVPB  2 g Intravenous Daily Narda Bonds, MD   Stopped at 03/30/23 1017   Chlorhexidine Gluconate Cloth 2 % PADS 6 each  6 each Topical Q2200 Leslye Peer, MD   6 each at 03/29/23 2207   folic acid (FOLVITE) tablet 1 mg  1 mg Oral Daily Lorin Glass, MD   1 mg at 03/30/23 0940   gabapentin (NEURONTIN) capsule 100 mg  100 mg Oral BID Lorin Glass, MD   100 mg at 03/30/23 0940   HYDROmorphone (DILAUDID) injection 0.5-1 mg  0.5-1 mg Intravenous Q3H PRN Lorin Glass, MD   0.5 mg at 03/30/23 2130   influenza vac split trivalent PF (FLULAVAL) injection 0.5  mL  0.5 mL Intramuscular Prior to discharge Narda Bonds, MD       lactulose (CHRONULAC) 10 GM/15ML solution 10 g  10 g Oral BID Liliane Shi H, DO   10 g at 03/30/23 0940   LORazepam (ATIVAN) injection 2 mg  2 mg Intravenous Q4H PRN Leslye Peer, MD   2 mg at 03/28/23 2042   midodrine (PROAMATINE) tablet 15 mg  15 mg Oral TID WC Lorin Glass, MD   15 mg at 03/30/23 1259   nicotine (NICODERM CQ - dosed in mg/24 hr) patch 7 mg  7 mg Transdermal Daily Steffanie Rainwater, MD   7 mg at 03/30/23 0943    octreotide (SANDOSTATIN) 500 mcg in sodium chloride 0.9 % 250 mL (2 mcg/mL) infusion  25 mcg/hr Intravenous Continuous Liliane Shi H, DO 12.5 mL/hr at 03/30/23 1125 25 mcg/hr at 03/30/23 1125   ondansetron (ZOFRAN) tablet 4 mg  4 mg Oral Q6H PRN Kerin Salen, MD       Or   ondansetron [facility]) injection 4 mg  4 mg Intravenous Q6H PRN Kerin Salen, MD       Oral care mouth rinse  15 mL Mouth Rinse PRN Narda Bonds, MD       pantoprazole (PROTONIX) injection 40 mg  40 mg Intravenous Q12H Narda Bonds, MD   40 mg at 03/30/23 0936   rifaximin (XIFAXAN) tablet 550 mg  550 mg Oral BID Kerin Salen, MD   550 mg at 03/30/23 0940   sertraline (ZOLOFT) tablet 50 mg  50 mg Oral QHS Lorin Glass, MD       sodium chloride flush (NS) 0.9 % injection 10 mL  10 mL Intravenous Q12H Leslye Peer, MD   10 mL at 03/30/23 0940   sodium chloride flush (NS) 0.9 % injection 3 mL  3 mL Intravenous Q12H Kerin Salen, MD   3 mL at 03/30/23 0940   thiamine (VITAMIN B1) tablet 100 mg  100 mg Oral Daily Lorin Glass, MD   100 mg at 03/30/23 0940   Allergies as of 03/23/2023 - Review Complete 03/23/2023  Allergen Reaction Noted   Acetaminophen  01/20/2023   Ibuprofen Other (See Comments) 01/20/2023   Review of Systems:  Review of Systems  Respiratory:  Positive for shortness of breath.   Cardiovascular:  Negative for chest pain.  Gastrointestinal:  Positive for abdominal pain and nausea. Negative for vomiting.    OBJECTIVE:   Temp:  [96.3 F (35.7 C)-98.5 F (36.9 C)] 98.5 F (36.9 C) (10/15 1150) Pulse Rate:  [52-71] 63 (10/15 0800) Resp:  [10-25] 12 (10/15 0800) BP: (75-121)/(35-82) 107/47 (10/15 0800) SpO2:  [97 %-100 %] 100 % (10/15 0800) Weight:  [89.7 kg] 89.7 kg (10/15 0500) Last BM Date : 03/26/23 Physical Exam Constitutional:      General: He is not in acute distress.    Appearance: He is ill-appearing. He is not toxic-appearing or diaphoretic.  Cardiovascular:     Rate and  Rhythm: Normal rate and regular rhythm.  Pulmonary:     Effort: No respiratory distress.     Breath sounds: Normal breath sounds.  Abdominal:     General: Bowel sounds are normal. There is distension.     Palpations: Abdomen is soft.     Tenderness: There is abdominal tenderness.     Comments: Ostomy noted, stoma appeared healthy, clear/yellow fluid in collection bag, no blood.   Musculoskeletal:  Right lower leg: No edema.     Left lower leg: No edema.  Skin:    General: Skin is warm and dry.     Coloration: Skin is jaundiced.  Neurological:     Mental Status: He is alert.     Labs: Recent Labs    03/29/23 0246 03/30/23 0307 03/30/23 1144  WBC 3.5* 2.2* 2.9*  HGB 8.6* 7.2* 7.2*  HCT 26.9* 23.0* 21.7*  PLT 94* 70* 75*   BMET Recent Labs    03/28/23 0011 03/29/23 0246 03/30/23 0307  NA 133* 135 134*  K 4.2 4.0 3.7  CL 107 111 109  CO2 16* 19* 18*  GLUCOSE 149* 150* 120*  BUN 52* 51* 47*  CREATININE 2.29* 2.06* 2.11*  CALCIUM 6.4* 6.8* 7.1*   LFT Recent Labs    03/30/23 0307  PROT 5.3*  ALBUMIN 2.6*  AST 64*  ALT 23  ALKPHOS 78  BILITOT 10.1*   PT/INR Recent Labs    03/30/23 0307  LABPROT 22.3*  INR 1.9*   Diagnostic imaging: [name] ASCITES (ABDOMEN LIMITED)  Result Date: 03/29/2023 CLINICAL DATA:  Ascites.  Cirrhosis with alcoholism. EXAM: LIMITED ABDOMEN ULTRASOUND FOR ASCITES TECHNIQUE: Limited ultrasound survey for ascites was performed in all four abdominal quadrants. COMPARISON:  CT 03/26/2023 FINDINGS: Four quadrant survey images of the abdomen are obtained for evaluation of ascites. Moderate ascites is demonstrated in the right upper quadrant, right lower quadrant, and left lower quadrant. Incidental note of cirrhotic appearance to the liver. IMPRESSION: Moderate abdominal ascites is present. Electronically Signed   By: Burman Nieves M.D.   On: 03/29/2023 21:53   ECHOCARDIOGRAM COMPLETE  Result Date: 03/29/2023    ECHOCARDIOGRAM REPORT    Patient Name:   Martin Anthony Date of Exam: 03/29/2023 Medical Rec #:  [id number]    Height:       76.0 in Accession #:    [id number]   Weight:       198.2 lb Date of Birth:  [birth year]   BSA:          2.207 m Patient Age:    56 years     BP:           99/46 mmHg Patient Gender: M            HR:           51 bpm. Exam Location:  Inpatient Procedure: 2D Echo, Cardiac Doppler and Color Doppler Indications:    Bacteremia  History:        Patient has no prior history of Echocardiogram examinations.                 Risk Factors:Diabetes and Hypertension.  Sonographer:    Darlys Gales Referring Phys: [id number] ROBERT S BYRUM IMPRESSIONS  1. Left ventricular ejection fraction, by estimation, is 55 to 60%. The left ventricle has normal function. The left ventricle has no regional wall motion abnormalities. Left ventricular diastolic parameters are consistent with Grade I diastolic dysfunction (impaired relaxation).  2. Right ventricular systolic function is normal. The right ventricular size is normal. There is normal pulmonary artery systolic pressure. The estimated right ventricular systolic pressure is 25.5 mmHg.  3. The mitral valve is normal in structure. Trivial mitral valve regurgitation. No evidence of mitral stenosis.  4. The aortic valve is tricuspid. There is mild calcification of the aortic valve. Aortic valve regurgitation is trivial. No aortic stenosis is present.  5. The inferior vena cava is normal in  size with <50% respiratory variability, suggesting right atrial pressure of 8 mmHg. FINDINGS  Left Ventricle: Left ventricular ejection fraction, by estimation, is 55 to 60%. The left ventricle has normal function. The left ventricle has no regional wall motion abnormalities. The left ventricular internal cavity size was normal in size. There is  no left ventricular hypertrophy. Left ventricular diastolic parameters are consistent with Grade I diastolic dysfunction (impaired relaxation). Right Ventricle: The right  ventricular size is normal. No increase in right ventricular wall thickness. Right ventricular systolic function is normal. There is normal pulmonary artery systolic pressure. The tricuspid regurgitant velocity is 2.09 m/s, and  with an assumed right atrial pressure of 8 mmHg, the estimated right ventricular systolic pressure is 25.5 mmHg. Left Atrium: Left atrial size was normal in size. Right Atrium: Right atrial size was normal in size. Pericardium: There is no evidence of pericardial effusion. Mitral Valve: The mitral valve is normal in structure. Trivial mitral valve regurgitation. No evidence of mitral valve stenosis. Tricuspid Valve: The tricuspid valve is normal in structure. Tricuspid valve regurgitation is trivial. Aortic Valve: The aortic valve is tricuspid. There is mild calcification of the aortic valve. Aortic valve regurgitation is trivial. No aortic stenosis is present. Aortic valve mean gradient measures 4.0 mmHg. Aortic valve peak gradient measures 7.0 mmHg. Aortic valve area, by VTI measures 2.11 cm. Pulmonic Valve: The pulmonic valve was normal in structure. Pulmonic valve regurgitation is not visualized. Aorta: The aortic root is normal in size and structure. Venous: The inferior vena cava is normal in size with less than 50% respiratory variability, suggesting right atrial pressure of 8 mmHg. IAS/Shunts: No atrial level shunt detected by color flow Doppler.  LEFT VENTRICLE PLAX 2D LVIDd:         4.60 cm   Diastology LVIDs:         3.30 cm   LV e' medial:    8.92 cm/s LV PW:         1.10 cm   LV E/e' medial:  7.8 LV IVS:        1.30 cm   LV e' lateral:   9.68 cm/s LVOT diam:     2.00 cm   LV E/e' lateral: 7.2 LV SV:         78 LV SV Index:   35 LVOT Area:     3.14 cm  RIGHT VENTRICLE RV S prime:     11.90 cm/s TAPSE (M-mode): 3.3 cm LEFT ATRIUM             Index        RIGHT ATRIUM           Index LA Vol (A2C):   66.1 ml 29.95 ml/m  RA Area:     11.50 cm LA Vol (A4C):   55.3 ml 25.06 ml/m   RA Volume:   19.80 ml  8.97 ml/m LA Biplane Vol: 64.3 ml 29.14 ml/m  AORTIC VALVE AV Area (Vmax):    1.96 cm AV Area (Vmean):   1.88 cm AV Area (VTI):     2.11 cm AV Vmax:           132.00 cm/s AV Vmean:          97.200 cm/s AV VTI:            0.370 m AV Peak Grad:      7.0 mmHg AV Mean Grad:      4.0 mmHg LVOT Vmax:  82.20 cm/s LVOT Vmean:        58.200 cm/s LVOT VTI:          0.248 m LVOT/AV VTI ratio: 0.67  AORTA Ao Root diam: 3.70 cm MITRAL VALVE               TRICUSPID VALVE MV Area (PHT): 3.40 cm    TR Peak grad:   17.5 mmHg MV Decel Time: 223 msec    TR Vmax:        209.00 cm/s MV E velocity: 69.80 cm/s MV A velocity: 71.60 cm/s  SHUNTS MV E/A ratio:  0.97        Systemic VTI:  0.25 m                            Systemic Diam: 2.00 cm Dalton McleanMD Electronically signed by Wilfred Lacy Signature Date/Time: 03/29/2023/1:57:04 PM    Final     IMPRESSION: Blood per ileostomy, resolved             -EGD 03/25/23 with band ligation x 4, remains on octreotide Altered mental status, improved (appears to be waxing/waning)             -Ammonia wnl 03/26/23 Streptococcus anginosis bacteremia             -ID following Cirrhosis secondary to EtOH (recent presentation to ED on 01/20/23 with intoxication, positive alcohol level on presentation 03/23/23)             -Decompensated by esophageal varices s/p EGD above             -Decompensated by ascites, last paracentesis 03/30/23 removed 40 cc fluid             -Decompensated by hepatic encephalopathy (multiple presentations in 2023 for HE)             -05/12/2021 HBsAg (-), HCV Ab (-), HAV Ab IgM (-)             -Follows with [facility], seen in office 07/16/22 and was abstinent from EtOH x 1 yr, seen in office on 08/31/22 for findings of liver mass noted on Abd [name] 06/2022 Transaminase elevation in mixed pattern Colonoscopy 06/23/2021 with large friable ascending colon mass concerning for probable adenocarcinoma (path: well  differentiated invasive adenocarcinoma) -History ascending colon polyp high grade dysplasia on COL 01/20/2019             -S/p right hemicolectomy and appendectomy on 10/25/2021    PLAN: -Continue current management with lactulose and rifaximin, mental status appears improved today  -Total bilirubin has doubled since 03/23/23 (question alcoholic hepatitis? No steroids in setting of GIB and infection), no biliary dilatation seen on CT imaging 10/11 -No signs of GI blood loss, no blood in ostomy today  -Octreotide infusion will expire this evening  -Await fluid study results from paracentesis today  -Recommend low Na diet  -Recommend complete cessation of alcohol -[facility] will follow   LOS: 7 days   Liliane Shi, [facility]

## 2023-03-30 NOTE — TOC Progression Note (Signed)
Transition of Care [facility]) - Progression Note    Patient Details  Name: Martin Anthony MRN: [id number] Date of Birth: 13-Oct-1965  [facility] CM/SW Contact  Darleene Cleaver, [place] Phone Number: 03/30/2023, 3:46 PM  Clinical Narrative:     TOC continuing to follow patient's progress.  Currently no anticipated TOC needs.  SDOHs have been addressed and resources were provided.  Expected Discharge Plan: Home/Self Care Barriers to Discharge: Continued Medical Work up  Expected Discharge Plan and Services  Home     Living arrangements for the past 2 months: Single Family Home                                       Social Determinants of Health (SDOH) Interventions SDOH Screenings   Food Insecurity: Food Insecurity Present (03/23/2023)  Housing: Low Risk  (03/23/2023)  Transportation Needs: No Transportation Needs (03/23/2023)  Recent Concern: Transportation Needs - Unmet Transportation Needs (03/23/2023)  Utilities: Not At Risk (03/23/2023)  Alcohol Screen: High Risk (12/05/2021)  Depression (PHQ2-9): High Risk (02/02/2023)  Financial Resource Strain: Low Risk  (10/06/2022)   Received from [facility], [facility], [facility]  Physical Activity: Unknown (12/05/2021)  Social Connections: Unknown (08/11/2022)   Received from [facility], [facility]  Stress: Stress Concern Present (12/05/2021)  Tobacco Use: Medium Risk (03/25/2023)  Health Literacy: Adequate Health Literacy (11/26/2021)   Received from [facility] [place], [facility]  Recent Concern: Health Literacy - Inadequate Health Literacy (11/01/2021)   Received from [facility] [place]    Readmission Risk Interventions     No data to display

## 2023-03-31 ENCOUNTER — Inpatient Hospital Stay (HOSPITAL_COMMUNITY): Payer: Medicaid Other

## 2023-03-31 DIAGNOSIS — I8501 Esophageal varices with bleeding: Secondary | ICD-10-CM | POA: Diagnosis not present

## 2023-03-31 DIAGNOSIS — N171 Acute kidney failure with acute cortical necrosis: Secondary | ICD-10-CM | POA: Diagnosis not present

## 2023-03-31 DIAGNOSIS — R160 Hepatomegaly, not elsewhere classified: Secondary | ICD-10-CM | POA: Insufficient documentation

## 2023-03-31 DIAGNOSIS — R7881 Bacteremia: Secondary | ICD-10-CM | POA: Diagnosis not present

## 2023-03-31 DIAGNOSIS — F10931 Alcohol use, unspecified with withdrawal delirium: Secondary | ICD-10-CM | POA: Diagnosis not present

## 2023-03-31 DIAGNOSIS — E871 Hypo-osmolality and hyponatremia: Secondary | ICD-10-CM | POA: Insufficient documentation

## 2023-03-31 LAB — CBC WITH DIFFERENTIAL/PLATELET
Abs Immature Granulocytes: 0.02 10*3/uL (ref 0.00–0.07)
Basophils Absolute: 0 10*3/uL (ref 0.0–0.1)
Basophils Relative: 1 %
Eosinophils Absolute: 0.1 10*3/uL (ref 0.0–0.5)
Eosinophils Relative: 2 %
HCT: 22.8 % — ABNORMAL LOW (ref 39.0–52.0)
Hemoglobin: 7.3 g/dL — ABNORMAL LOW (ref 13.0–17.0)
Immature Granulocytes: 1 %
Lymphocytes Relative: 11 %
Lymphs Abs: 0.4 10*3/uL — ABNORMAL LOW (ref 0.7–4.0)
MCH: 33.3 pg (ref 26.0–34.0)
MCHC: 32 g/dL (ref 30.0–36.0)
MCV: 104.1 fL — ABNORMAL HIGH (ref 80.0–100.0)
Monocytes Absolute: 0.4 10*3/uL (ref 0.1–1.0)
Monocytes Relative: 13 %
Neutro Abs: 2.4 10*3/uL (ref 1.7–7.7)
Neutrophils Relative %: 72 %
Platelets: 78 10*3/uL — ABNORMAL LOW (ref 150–400)
RBC: 2.19 MIL/uL — ABNORMAL LOW (ref 4.22–5.81)
RDW: 19 % — ABNORMAL HIGH (ref 11.5–15.5)
WBC: 3.3 10*3/uL — ABNORMAL LOW (ref 4.0–10.5)
nRBC: 0 % (ref 0.0–0.2)

## 2023-03-31 LAB — BODY FLUID CELL COUNT WITH DIFFERENTIAL
Eos, Fluid: 0 %
Lymphs, Fluid: 26 %
Monocyte-Macrophage-Serous Fluid: 58 % (ref 50–90)
Neutrophil Count, Fluid: 16 % (ref 0–25)
Total Nucleated Cell Count, Fluid: 135 uL (ref 0–1000)

## 2023-03-31 LAB — PROTEIN, PLEURAL OR PERITONEAL FLUID: Total protein, fluid: <3 g/dL

## 2023-03-31 LAB — COMPREHENSIVE METABOLIC PANEL
ALT: 24 U/L (ref 0–44)
AST: 69 U/L — ABNORMAL HIGH (ref 15–41)
Albumin: 2.5 g/dL — ABNORMAL LOW (ref 3.5–5.0)
Alkaline Phosphatase: 84 U/L (ref 38–126)
Anion gap: 9 (ref 5–15)
BUN: 49 mg/dL — ABNORMAL HIGH (ref 6–20)
CO2: 18 mmol/L — ABNORMAL LOW (ref 22–32)
Calcium: 7.2 mg/dL — ABNORMAL LOW (ref 8.9–10.3)
Chloride: 108 mmol/L (ref 98–111)
Creatinine, Ser: 2.35 mg/dL — ABNORMAL HIGH (ref 0.61–1.24)
GFR, Estimated: 32 mL/min — ABNORMAL LOW (ref >60)
Glucose, Bld: 122 mg/dL — ABNORMAL HIGH (ref 70–99)
Potassium: 3.9 mmol/L (ref 3.5–5.1)
Sodium: 135 mmol/L (ref 135–145)
Total Bilirubin: 9.9 mg/dL — ABNORMAL HIGH (ref 0.3–1.2)
Total Protein: 5.3 g/dL — ABNORMAL LOW (ref 6.5–8.1)

## 2023-03-31 LAB — GRAM STAIN

## 2023-03-31 LAB — MAGNESIUM: Magnesium: 1.9 mg/dL (ref 1.7–2.4)

## 2023-03-31 LAB — ALBUMIN, PLEURAL OR PERITONEAL FLUID: Albumin, Fluid: <1.5 g/dL

## 2023-03-31 LAB — PHOSPHORUS: Phosphorus: 2.8 mg/dL (ref 2.5–4.6)

## 2023-03-31 MED ORDER — PHENOBARBITAL 32.4 MG PO TABS
64.8000 mg | ORAL_TABLET | Freq: Two times a day (BID) | ORAL | Status: AC
  Administered 2023-03-31 (×2): 64.8 mg via ORAL
  Filled 2023-03-31 (×2): qty 2

## 2023-03-31 MED ORDER — LIDOCAINE HCL 1 % IJ SOLN
INTRAMUSCULAR | Status: AC
  Filled 2023-03-31: qty 20

## 2023-03-31 MED ORDER — PHENOBARBITAL 32.4 MG PO TABS
32.4000 mg | ORAL_TABLET | Freq: Two times a day (BID) | ORAL | Status: DC
  Administered 2023-04-01 – 2023-04-02 (×3): 32.4 mg via ORAL
  Filled 2023-03-31 (×3): qty 1

## 2023-03-31 MED ORDER — OXYCODONE HCL 5 MG PO TABS: 5.0000 mg | ORAL_TABLET | ORAL | Status: DC | PRN

## 2023-03-31 MED ORDER — HYDROMORPHONE HCL 1 MG/ML IJ SOLN
0.5000 mg | Freq: Once | INTRAMUSCULAR | Status: AC
  Administered 2023-03-31: 0.5 mg via INTRAVENOUS
  Filled 2023-03-31: qty 1

## 2023-03-31 MED ORDER — ALBUMIN HUMAN 25 % IV SOLN
25.0000 g | Freq: Once | INTRAVENOUS | Status: AC
  Administered 2023-03-31: 25 g via INTRAVENOUS
  Filled 2023-03-31: qty 100

## 2023-03-31 MED ORDER — SIMETHICONE 80 MG PO CHEW
80.0000 mg | CHEWABLE_TABLET | Freq: Four times a day (QID) | ORAL | Status: DC | PRN
  Administered 2023-03-31 – 2023-04-07 (×2): 80 mg via ORAL
  Filled 2023-03-31 (×2): qty 1

## 2023-03-31 MED ORDER — OXYCODONE HCL 5 MG PO TABS
5.0000 mg | ORAL_TABLET | ORAL | Status: DC | PRN
  Administered 2023-03-31 – 2023-04-07 (×26): 5 mg via ORAL
  Filled 2023-03-31 (×26): qty 1

## 2023-03-31 NOTE — Progress Notes (Signed)
Progress Note   Patient: Martin Anthony BJY:[id number] DOB: [birth year] DOA: 03/23/2023     8 DOS: the patient was seen and examined on 03/31/2023 at 8:01 AM      Brief hospital course: Mr. Recla is a 57 y.o. M with colon CA s/p hemicolectomy and ostomy in place, alcohol dependence, cirrhosis, portal HTN, hx varices who presented with blood in ileostomy.  Patient admitted and underwent EGD with banding of varices.  Developed recurrent hemorrhage with shock requiring vasopressors and transfer to ICU.    While in the ICU, delirium developed requiring Precedex.  This was transitioned to phenobarbital and hemorrhage stabilized.        Assessment and Plan: * Esophageal varices with bleeding (HCC) Acute blood loss anemia S/p EGD on 03/25/23 with banding x 4 Recurrent bleeding on 10/11 but CTA showed only wall thickening and inflammatory change in duodenum, no extravasation.    Completed octreotide.  GI have signed off. - Continue PPI    Streptococcal bacteremia Strep anginosis 2/3 bottles from 10/11 TTE unremarkable.   - Continue Rocephin  - Repeat blood culture - ID involved    Alcohol withdrawal delirium (HCC) Acute metabolic encephalopathy Hepatic encephalopathy Patient developed confusion/disorientation after transfer to ICU that was likely a combination of alcohol withdrawal delirium and hepatic encephalopathy.  Weaned off Precedex.  Received a few doses of phenobarbital, but considerably better 10/15 and so this was stopped.  CIWA last 24 hours have been 2-28, mostly lower.   - Continue CIWA scoring - Start taper - Continue thiamine and folate - Continue lactulose    Acute renal failure superimposed on stage 2 chronic kidney disease (HCC) Baseline SCr appears to be ~ 1.3 Cr here 1.7 on admission, trended up to high 2s.   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  Still with large volume ascites. Steroids held due to bacteremia, variceal bleeding - Cautious paracentesis today   Hyponatremia    Liver mass - GI recommend nonurgent triple phase CT scan of the abdomen - Follow up with Heme-Onc Dr. Otis Peak  Shock [facility] [address]) Hemorrhagic and possibly septic.  Superimposed on vasoplegia of chronic liver disease.   Echo unremarkable. - Continue midodrine  Thrombocytopenia (HCC)    Primary adenocarcinoma of ascending colon (HCC) S/p hemicolectomy 2023.  pT3N0 stage II-III, no adjuvant chemo, in surveillance with CEA, CT abd/pelvis.  - Follow up with Dr. Otis Peak  Type 2 diabetes mellitus with neurological complications (HCC) A1c 5%, well controlled off meds.  No SS corrections needed   Anemia Baseline hemoglobin of 9-11.  See above Hgb stable - Transfusion threshold 7 g/dL  Essential hypertension - Hold nadolol, diuretics  - Continue midodrine  Depression with anxiety - Continue Zoloft          Subjective: Patient has a lot of epigastric discomfort, he still has distention of the abdomen and feels like he is fluid overloaded.  He feels uncomfortable and like the ascites is affecting his breathing.  He is oriented to self, place, but not very attentive, and sluggish.     Physical Exam: BP (!) 126/45   Pulse 71   Temp 98.7 F (37.1 C) (Oral)   Resp 18   Ht 6\' 4"  (1.93 m)  Wt 89.7 kg   SpO2 99%   BMI 24.07 kg/m   Adult male, jaundiced, lying in bed, sleeping, arouses but mostly talks with his eyes closed, appears weak and tired Heart rate normal, no murmurs, no pitting edema in the lower extremities Respiratory rate normal, lungs clear without rales or wheezes Abdomen distended with ascites, moderate guarding  throughout, no rigidity, no rebound Attention diminished, psychomotor slowing noted     Data Reviewed: Discussed with interventional radiology Sodium improved to normal on basic metabolic panel, creatinine stable at 2.3 Echocardiogram unremarkable Paracentesis showed no significant PMNs White blood cell count 3.3, hemoglobin 7.3, platelets 78 Total bilirubin 9.9, no change  Family Communication: None present    Disposition: Status is: Inpatient         Author: Alberteen Sam, MD 03/31/2023 12:43 PM  For on call review [email or web address].

## 2023-03-31 NOTE — Progress Notes (Signed)
[facility] Progress Note  SUBJECTIVE:   Interval history: Martin Anthony was seen and evaluated today at bedside.  Resting comfortably in bed, friend at bedside.  He notes having gas discomfort in his abdomen.  He has burning pain in his lower extremities.  No chest pain.  Rare shortness of breath.  Patient's friend at bedside taking care of his home while patient is inpatient, noted pint of alcohol at his home.  Past Medical History:  Diagnosis Date   Anxiety    Ascites due to alcoholic cirrhosis (HCC)    Cirrhosis with alcoholism (HCC)    Depression    Diabetes mellitus without complication (HCC)    GERD (gastroesophageal reflux disease)    Gout    Hyperlipidemia    Hypertension    Primary adenocarcinoma of ascending colon (HCC)    Substance abuse (HCC)    alcoholism    Type 2 diabetes mellitus with diabetic neuropathy [facility])    Past Surgical History:  Procedure Laterality Date   BIOPSY  01/19/2019   Procedure: BIOPSY;  Surgeon: Jeani Hawking, MD;  Location: [facility];  Service: Endoscopy;;   COLON SURGERY  10/25/2021   right hemicolectomy with ostomy at [facility] in [place]   COLONOSCOPY N/A 01/19/2019   Procedure: COLONOSCOPY;  Surgeon: Jeani Hawking, MD;  Location: [facility];  Service: Endoscopy;  Laterality: N/A;   ESOPHAGEAL BANDING  03/25/2023   Procedure: ESOPHAGEAL BANDING;  Surgeon: Kerin Salen, MD;  Location: [facility];  Service: Gastroenterology;;   ESOPHAGOGASTRODUODENOSCOPY (EGD) WITH PROPOFOL N/A 01/19/2019   Procedure: ESOPHAGOGASTRODUODENOSCOPY (EGD) WITH PROPOFOL;  Surgeon: Jeani Hawking, MD;  Location: [facility];  Service: Endoscopy;  Laterality: N/A;   ESOPHAGOGASTRODUODENOSCOPY (EGD) WITH PROPOFOL N/A 03/25/2023   Procedure: ESOPHAGOGASTRODUODENOSCOPY (EGD) WITH PROPOFOL;  Surgeon: Kerin Salen, MD;  Location: [facility];  Service: Gastroenterology;  Laterality: N/A;   HEMOSTASIS CLIP PLACEMENT  01/19/2019   Procedure: HEMOSTASIS  CLIP PLACEMENT;  Surgeon: Jeani Hawking, MD;  Location: [facility];  Service: Endoscopy;;   HERNIA REPAIR     umbilical    ILEOSCOPY N/A 03/25/2023   Procedure: ILEOSCOPY THROUGH STOMA;  Surgeon: Kerin Salen, MD;  Location: [facility];  Service: Gastroenterology;  Laterality: N/A;   INGUINAL HERNIA REPAIR Bilateral    IR FLUORO GUIDE CV LINE RIGHT  05/13/2021   IR PARACENTESIS  05/19/2021   IR US GUIDE VASC ACCESS RIGHT  05/13/2021   POLYPECTOMY  01/19/2019   Procedure: POLYPECTOMY;  Surgeon: Jeani Hawking, MD;  Location: [facility];  Service: Endoscopy;;   Current Facility-Administered Medications  Medication Dose Route Frequency Provider Last Rate Last Admin   cefTRIAXone (ROCEPHIN) 2 g in sodium chloride 0.9 % 100 mL IVPB  2 g Intravenous Daily Narda Bonds, MD 200 mL/hr at 03/31/23 1027 2 g at 03/31/23 1027   Chlorhexidine Gluconate Cloth 2 % PADS 6 each  6 each Topical Q2200 Leslye Peer, MD   6 each at 03/29/23 2207   folic acid (FOLVITE) tablet 1 mg  1 mg Oral Daily Lorin Glass, MD   1 mg at 03/31/23 1011   gabapentin (NEURONTIN) capsule 100 mg  100 mg Oral BID Lorin Glass, MD   100 mg at 03/31/23 1011   influenza vac split trivalent PF (FLULAVAL) injection 0.5 mL  0.5 mL Intramuscular Prior to discharge Narda Bonds, MD       lactulose (CHRONULAC) 10 GM/15ML solution 10 g  10 g Oral BID Lynann Bologna, DO  10 g at 03/31/23 1022   LORazepam (ATIVAN) injection 2 mg  2 mg Intravenous Q4H PRN Leslye Peer, MD   2 mg at 03/31/23 0236   midodrine (PROAMATINE) tablet 15 mg  15 mg Oral TID WC Lorin Glass, MD   15 mg at 03/31/23 1610   nicotine (NICODERM CQ - dosed in mg/24 hr) patch 7 mg  7 mg Transdermal Daily Steffanie Rainwater, MD   7 mg at 03/31/23 1021   ondansetron (ZOFRAN) tablet 4 mg  4 mg Oral Q6H PRN Kerin Salen, MD       Or   ondansetron [facility]) injection 4 mg  4 mg Intravenous Q6H PRN Kerin Salen, MD       Oral care mouth rinse  15 mL Mouth  Rinse PRN Narda Bonds, MD       oxyCODONE (Oxy IR/ROXICODONE) immediate release tablet 5 mg  5 mg Oral Q4H PRN Alberteen Sam, MD   5 mg at 03/31/23 0823   pantoprazole (PROTONIX) injection 40 mg  40 mg Intravenous Q12H Narda Bonds, MD   40 mg at 03/31/23 1013   rifaximin (XIFAXAN) tablet 550 mg  550 mg Oral BID Kerin Salen, MD   550 mg at 03/31/23 1011   sertraline (ZOLOFT) tablet 50 mg  50 mg Oral QHS Lorin Glass, MD   50 mg at 03/30/23 2108   sodium chloride flush (NS) 0.9 % injection 10 mL  10 mL Intravenous Q12H Leslye Peer, MD   10 mL at 03/31/23 1015   sodium chloride flush (NS) 0.9 % injection 3 mL  3 mL Intravenous Q12H Kerin Salen, MD   3 mL at 03/31/23 1022   thiamine (VITAMIN B1) tablet 100 mg  100 mg Oral Daily Lorin Glass, MD   100 mg at 03/31/23 1011   Allergies as of 03/23/2023 - Review Complete 03/23/2023  Allergen Reaction Noted   Acetaminophen  01/20/2023   Ibuprofen Other (See Comments) 01/20/2023   Review of Systems:  Review of Systems  Respiratory:  Positive for shortness of breath.   Cardiovascular:  Negative for chest pain.  Gastrointestinal:  Positive for abdominal pain. Negative for blood in stool.    OBJECTIVE:   Temp:  [98 F (36.7 C)-98.6 F (37 C)] 98.4 F (36.9 C) (10/16 0800) Pulse Rate:  [60-96] 69 (10/16 1000) Resp:  [7-33] 12 (10/16 1000) BP: (94-170)/(33-115) 120/47 (10/16 1000) SpO2:  [97 %-100 %] 97 % (10/16 1000) Last BM Date : 03/30/23 Physical Exam Constitutional:      General: He is not in acute distress.    Appearance: He is ill-appearing. He is not toxic-appearing or diaphoretic.  Cardiovascular:     Rate and Rhythm: Normal rate and regular rhythm.  Pulmonary:     Effort: No respiratory distress.     Breath sounds: Normal breath sounds.  Musculoskeletal:     Right lower leg: Edema (Trace) present.     Left lower leg: No edema.  Skin:    General: Skin is warm and dry.     Coloration: Skin is jaundiced.   Neurological:     Mental Status: He is alert.     Comments: Oriented to person and time, not reliably oriented to place     Labs: Recent Labs    03/30/23 0307 03/30/23 1144 03/31/23 0307  WBC 2.2* 2.9* 3.3*  HGB 7.2* 7.2* 7.3*  HCT 23.0* 21.7* 22.8*  PLT 70* 75* 78*  BMET Recent Labs    03/29/23 0246 03/30/23 0307 03/31/23 0307  NA 135 134* 135  K 4.0 3.7 3.9  CL 111 109 108  CO2 19* 18* 18*  GLUCOSE 150* 120* 122*  BUN 51* 47* 49*  CREATININE 2.06* 2.11* 2.35*  CALCIUM 6.8* 7.1* 7.2*   LFT Recent Labs    03/31/23 0307  PROT 5.3*  ALBUMIN 2.5*  AST 69*  ALT 24  ALKPHOS 84  BILITOT 9.9*   PT/INR Recent Labs    03/30/23 0307  LABPROT 22.3*  INR 1.9*   Diagnostic imaging: [name] ASCITES (ABDOMEN LIMITED)  Result Date: 03/29/2023 CLINICAL DATA:  Ascites.  Cirrhosis with alcoholism. EXAM: LIMITED ABDOMEN ULTRASOUND FOR ASCITES TECHNIQUE: Limited ultrasound survey for ascites was performed in all four abdominal quadrants. COMPARISON:  CT 03/26/2023 FINDINGS: Four quadrant survey images of the abdomen are obtained for evaluation of ascites. Moderate ascites is demonstrated in the right upper quadrant, right lower quadrant, and left lower quadrant. Incidental note of cirrhotic appearance to the liver. IMPRESSION: Moderate abdominal ascites is present. Electronically Signed   By: Burman Nieves M.D.   On: 03/29/2023 21:53   ECHOCARDIOGRAM COMPLETE  Result Date: 03/29/2023    ECHOCARDIOGRAM REPORT   Patient Name:   Martin Anthony Date of Exam: 03/29/2023 Medical Rec #:  [id number]    Height:       76.0 in Accession #:    [id number]   Weight:       198.2 lb Date of Birth:  1965-10-18   BSA:          2.207 m Patient Age:    56 years     BP:           99/46 mmHg Patient Gender: M            HR:           51 bpm. Exam Location:  Inpatient Procedure: 2D Echo, Cardiac Doppler and Color Doppler Indications:    Bacteremia  History:        Patient has no prior history of  Echocardiogram examinations.                 Risk Factors:Diabetes and Hypertension.  Sonographer:    Darlys Gales Referring Phys: [id number] ROBERT S BYRUM IMPRESSIONS  1. Left ventricular ejection fraction, by estimation, is 55 to 60%. The left ventricle has normal function. The left ventricle has no regional wall motion abnormalities. Left ventricular diastolic parameters are consistent with Grade I diastolic dysfunction (impaired relaxation).  2. Right ventricular systolic function is normal. The right ventricular size is normal. There is normal pulmonary artery systolic pressure. The estimated right ventricular systolic pressure is 25.5 mmHg.  3. The mitral valve is normal in structure. Trivial mitral valve regurgitation. No evidence of mitral stenosis.  4. The aortic valve is tricuspid. There is mild calcification of the aortic valve. Aortic valve regurgitation is trivial. No aortic stenosis is present.  5. The inferior vena cava is normal in size with <50% respiratory variability, suggesting right atrial pressure of 8 mmHg. FINDINGS  Left Ventricle: Left ventricular ejection fraction, by estimation, is 55 to 60%. The left ventricle has normal function. The left ventricle has no regional wall motion abnormalities. The left ventricular internal cavity size was normal in size. There is  no left ventricular hypertrophy. Left ventricular diastolic parameters are consistent with Grade I diastolic dysfunction (impaired relaxation). Right Ventricle: The right ventricular size is normal. No increase in  right ventricular wall thickness. Right ventricular systolic function is normal. There is normal pulmonary artery systolic pressure. The tricuspid regurgitant velocity is 2.09 m/s, and  with an assumed right atrial pressure of 8 mmHg, the estimated right ventricular systolic pressure is 25.5 mmHg. Left Atrium: Left atrial size was normal in size. Right Atrium: Right atrial size was normal in size. Pericardium: There is no  evidence of pericardial effusion. Mitral Valve: The mitral valve is normal in structure. Trivial mitral valve regurgitation. No evidence of mitral valve stenosis. Tricuspid Valve: The tricuspid valve is normal in structure. Tricuspid valve regurgitation is trivial. Aortic Valve: The aortic valve is tricuspid. There is mild calcification of the aortic valve. Aortic valve regurgitation is trivial. No aortic stenosis is present. Aortic valve mean gradient measures 4.0 mmHg. Aortic valve peak gradient measures 7.0 mmHg. Aortic valve area, by VTI measures 2.11 cm. Pulmonic Valve: The pulmonic valve was normal in structure. Pulmonic valve regurgitation is not visualized. Aorta: The aortic root is normal in size and structure. Venous: The inferior vena cava is normal in size with less than 50% respiratory variability, suggesting right atrial pressure of 8 mmHg. IAS/Shunts: No atrial level shunt detected by color flow Doppler.  LEFT VENTRICLE PLAX 2D LVIDd:         4.60 cm   Diastology LVIDs:         3.30 cm   LV e' medial:    8.92 cm/s LV PW:         1.10 cm   LV E/e' medial:  7.8 LV IVS:        1.30 cm   LV e' lateral:   9.68 cm/s LVOT diam:     2.00 cm   LV E/e' lateral: 7.2 LV SV:         78 LV SV Index:   35 LVOT Area:     3.14 cm  RIGHT VENTRICLE RV S prime:     11.90 cm/s TAPSE (M-mode): 3.3 cm LEFT ATRIUM             Index        RIGHT ATRIUM           Index LA Vol (A2C):   66.1 ml 29.95 ml/m  RA Area:     11.50 cm LA Vol (A4C):   55.3 ml 25.06 ml/m  RA Volume:   19.80 ml  8.97 ml/m LA Biplane Vol: 64.3 ml 29.14 ml/m  AORTIC VALVE AV Area (Vmax):    1.96 cm AV Area (Vmean):   1.88 cm AV Area (VTI):     2.11 cm AV Vmax:           132.00 cm/s AV Vmean:          97.200 cm/s AV VTI:            0.370 m AV Peak Grad:      7.0 mmHg AV Mean Grad:      4.0 mmHg LVOT Vmax:         82.20 cm/s LVOT Vmean:        58.200 cm/s LVOT VTI:          0.248 m LVOT/AV VTI ratio: 0.67  AORTA Ao Root diam: 3.70 cm MITRAL VALVE                TRICUSPID VALVE MV Area (PHT): 3.40 cm    TR Peak grad:   17.5 mmHg MV Decel Time: 223 msec    TR  Vmax:        209.00 cm/s MV E velocity: 69.80 cm/s MV A velocity: 71.60 cm/s  SHUNTS MV E/A ratio:  0.97        Systemic VTI:  0.25 m                            Systemic Diam: 2.00 cm Dalton McleanMD Electronically signed by Wilfred Lacy Signature Date/Time: 03/29/2023/1:57:04 PM    Final     IMPRESSION: Blood per ileostomy, resolved             -EGD 03/25/23 with band ligation x 4, octreotide has been weaned Altered mental status, improved Streptococcus anginosis bacteremia, source undetermined Cirrhosis secondary to EtOH (recent presentation to ED on 01/20/23 with intoxication, positive alcohol level on presentation 03/23/23)             -Decompensated by esophageal varices s/p EGD above             -Decompensated by ascites, last paracentesis 03/30/23 removed 40 cc fluid, negative for SBP             -Decompensated by hepatic encephalopathy (multiple presentations in 2023 for HE)             -05/12/2021 HBsAg (-), HCV Ab (-), HAV Ab IgM (-)             -Follows with [facility], seen in office 07/16/22 and was abstinent from EtOH x 1 yr, seen in office on 08/31/22 for findings of liver mass noted on Abd [name] 06/2022 Transaminase elevation in mixed pattern Colonoscopy 06/23/2021 with large friable ascending colon mass concerning for probable adenocarcinoma (path: well differentiated invasive adenocarcinoma) -History ascending colon polyp high grade dysplasia on COL 01/20/2019             -S/p right hemicolectomy and appendectomy on 10/25/2021   PLAN: -Continue current therapies with lactulose and rifaximin, this appears to be improving his mentation somewhat -No further GI bleeding -Start simethicone for gaseous pain -Diagnostic paracentesis completed yesterday negative for SBP -Creatinine appears to be rising, his baseline creatinine may be in the 1-2 range -May be  component of alcoholic hepatitis at play given elevated bilirubin, no steroid therapy given recent GI bleed and bacteremia -Recommend triple phase CT imaging of his abdomen in the near future given findings of liver mass noted on abdominal ultrasound in January 2024 (this was not evidenced on CT angiogram imaging for current admission), can be completed inpatient or outpatient -Strict and complete cessation of alcohol -Recommend outpatient follow-up with his primary hepatologist -[facility] will sign off and be available for any questions that arise   LOS: 8 days   Liliane Shi, [facility]

## 2023-03-31 NOTE — Assessment & Plan Note (Addendum)
Na stable

## 2023-03-31 NOTE — Progress Notes (Signed)
[facility]    Date of Admission:  03/23/2023   Total days of inpatient antibiotics 5        Reason for Consult: Bacteremia    Principal Problem:   Esophageal varices with bleeding (HCC) Active Problems:   Depression with anxiety   Essential hypertension   Anemia   Acute renal failure superimposed on stage 2 chronic kidney disease (HCC)   Alcoholic cirrhosis of liver with ascites (HCC)   Type 2 diabetes mellitus with neurological complications (HCC)   Primary adenocarcinoma of ascending colon (HCC)   Streptococcal bacteremia   Alcohol withdrawal delirium (HCC)   Thrombocytopenia (HCC)   Shock (HCC)   Liver mass   Hyponatremia   Assessment: 57 year old male with history of alcoholic cirrhosis, CKD stage III, cancer of descending colon status post hemicolectomy admitted with acute GI bleed found to have: #Strep anginosus bacteremia - Patient was admitted with GI bleed.  CT initially did not show findings of acute GI bleed but-show cirrhosis and portal hypertension.  GI was engaged patient underwent EGD on 10/10 esophageal varices were banded.  Hospital course were complicated by decompensated cystitis paracentesis on 10/15 negative for SBP.  Found to have hepatic encephalopathy as well, acute hepatitis panel was negative.  He follows with [facility] and noted to be abstinent from alcohol for about a year.  Colonoscopy 06/23/2021 showed large friable ascending colon mass concerning for probable adenocarcinoma. - ID was engaged as patient admitted on 10/8 and underwent EGD on 10/10.  Blood cultures on 10/11 grew 2/2 strep anginosus.  Patient is on ceftriaxone. - It was noted that patient is not a candidate for EGD given esophageal varices. -Suspect source of bacteremia secondary to translocation from GI bleed.  No recent dental procedures, in fact teeth without any concern of dental caries.  No consistent cellulitis or phlebitis noted. Recommendations:   -Continue ceftriaxone - Repeat blood cultures - Follow paracentesis cultures. - TTE did not note vegetation. Microbiology:   Antibiotics: Ceftriaxone 10/12-present   Cultures: Blood 10/11 2/2 strep anginosus Peritoneal fluid on 10/15 no growth Urine  Other   HPI: Martin Anthony is a 57 y.o. male with past medical history of alcoholism, cirrhosis, CKD stage III to and cancer of the descending colon status post hemicolectomy initially presented with abdominal pain and blood from ileostomy.  On arrival patient had a creatinine of 1.75, hemoglobin 7.8 and platelets 89K.  CT was negative for active bleed but notable for Martin Anthony cirrhosis and complication of portal hypertension.  Patient admitted for acute GI bleed.  GI was engaged and patient underwent EGD on 10/10 which noted gait 3 esophageal varices which were banded, portal hypertensive gastropathy.  He was started on rifaximin and consider TIPS electively when better.  Diagnostic paracentesis was done in the setting of ascites, negative for SBP.   Review of Systems: Review of Systems  All other systems reviewed and are negative.   Past Medical History:  Diagnosis Date   Anxiety    Ascites due to alcoholic cirrhosis (HCC)    Cirrhosis with alcoholism (HCC)    Depression    Diabetes mellitus without complication (HCC)    GERD (gastroesophageal reflux disease)    Gout    Hyperlipidemia    Hypertension    Primary adenocarcinoma of ascending colon (HCC)    Substance abuse (HCC)    alcoholism    Type 2 diabetes mellitus with diabetic neuropathy (HCC)  Social History   Tobacco Use   Smoking status: Former    Current packs/day: 0.00    Types: Cigarettes    Quit date: 08/11/2001    Years since quitting: 21.6   Smokeless tobacco: Never  Substance Use Topics   Alcohol use: Yes    Alcohol/week: 2.0 standard drinks of alcohol    Types: 2 Standard drinks or equivalent per week   Drug use: No    Family History  Problem  Relation Age of Onset   Hypertension Mother    Diabetes Father    Heart attack Father    Pneumonia Father    Hypertension Father    Diabetes Maternal Grandfather    Scheduled Meds:  Chlorhexidine Gluconate Cloth  6 each Topical Q2200   folic acid  1 mg Oral Daily   gabapentin  100 mg Oral BID   lactulose  10 g Oral BID   midodrine  15 mg Oral TID WC   nicotine  7 mg Transdermal Daily   pantoprazole (PROTONIX) IV  40 mg Intravenous Q12H   rifaximin  550 mg Oral BID   sertraline  50 mg Oral QHS   sodium chloride flush  10 mL Intravenous Q12H   sodium chloride flush  3 mL Intravenous Q12H   thiamine  100 mg Oral Daily   Continuous Infusions:  cefTRIAXone (ROCEPHIN)  IV Stopped (03/31/23 1057)   PRN Meds:.influenza vac split trivalent PF, ondansetron **OR** ondansetron (ZOFRAN) IV, mouth rinse, oxyCODONE, simethicone Allergies  Allergen Reactions   Acetaminophen     States he was told not to take it.    Ibuprofen Other (See Comments)    Liver sensitivity    OBJECTIVE: Blood pressure (!) 126/45, pulse 71, temperature 98.7 F (37.1 C), temperature source Oral, resp. rate 18, height 6\' 4"  (1.93 m), weight 89.7 kg, SpO2 99%.  Physical Exam Constitutional:      General: He is not in acute distress.    Appearance: He is normal weight. He is not toxic-appearing.  HENT:     Head: Normocephalic and atraumatic.     Right Ear: External ear normal.     Left Ear: External ear normal.     Nose: No congestion or rhinorrhea.     Mouth/Throat:     Mouth: Mucous membranes are moist.     Pharynx: Oropharynx is clear.  Eyes:     General: Scleral icterus present.        Right eye: No discharge.     Extraocular Movements: Extraocular movements intact.     Pupils: Pupils are equal, round, and reactive to light.  Cardiovascular:     Rate and Rhythm: Normal rate and regular rhythm.     Heart sounds: No murmur heard.    No friction rub. No gallop.  Pulmonary:     Effort: Pulmonary  effort is normal.     Breath sounds: Normal breath sounds.  Abdominal:     General: Abdomen is flat. Bowel sounds are normal.     Palpations: Abdomen is soft.  Musculoskeletal:        General: No swelling. Normal range of motion.     Cervical back: Normal range of motion and neck supple.  Skin:    General: Skin is warm and dry.     Coloration: Skin is jaundiced.  Neurological:     General: No focal deficit present.     Mental Status: He is oriented to person, place, and time.  Psychiatric:  Mood and Affect: Mood normal.     Lab Results Lab Results  Component Value Date   WBC 3.3 (L) 03/31/2023   HGB 7.3 (L) 03/31/2023   HCT 22.8 (L) 03/31/2023   MCV 104.1 (H) 03/31/2023   PLT 78 (L) 03/31/2023    Lab Results  Component Value Date   CREATININE 2.35 (H) 03/31/2023   BUN 49 (H) 03/31/2023   NA 135 03/31/2023   K 3.9 03/31/2023   CL 108 03/31/2023   CO2 18 (L) 03/31/2023    Lab Results  Component Value Date   ALT 24 03/31/2023   AST 69 (H) 03/31/2023   ALKPHOS 84 03/31/2023   BILITOT 9.9 (H) 03/31/2023       Danelle Earthly, MD [facility] 03/31/2023, 12:54 PM   I have personally spent 82 minutes involved in face-to-face and non-face-to-face activities for this patient on the day of the visit. Professional time spent includes the following activities: Preparing to see the patient (review of tests), Obtaining and/or reviewing separately obtained history (admission/discharge record), Performing a medically appropriate examination and/or evaluation , Ordering medications/tests/procedures, referring and communicating with other health care professionals, Documenting clinical information in the EMR, Independently interpreting results (not separately reported), Communicating results to the patient/family/caregiver, Counseling and educating the patient/family/caregiver and Care coordination (not separately reported).

## 2023-03-31 NOTE — Plan of Care (Signed)
Problem: Pain Managment: Goal: General experience of comfort will improve Outcome: Not Progressing

## 2023-03-31 NOTE — Procedures (Signed)
Ultrasound-guided diagnostic and therapeutic paracentesis performed yielding 3.5 liters  of golden yellow fluid. No immediate complications. A portion of the fluid was sent to the lab for preordered studies. EBL none.

## 2023-03-31 NOTE — Assessment & Plan Note (Addendum)
MRI abdomen in April suggested this was a hemangioma, no follow up recommended by patient's Oncologist.  Here, he has had two CTAs of the abdomen and two ultrasounds that were not able to visualize the lesion.   - GI have recommended nonurgent triple phase CT scan of the abdomen - Follow up with Heme-Onc

## 2023-03-31 NOTE — Plan of Care (Signed)

## 2023-04-01 DIAGNOSIS — I8501 Esophageal varices with bleeding: Secondary | ICD-10-CM | POA: Diagnosis not present

## 2023-04-01 DIAGNOSIS — R7881 Bacteremia: Secondary | ICD-10-CM | POA: Diagnosis not present

## 2023-04-01 DIAGNOSIS — I10 Essential (primary) hypertension: Secondary | ICD-10-CM

## 2023-04-01 DIAGNOSIS — F418 Other specified anxiety disorders: Secondary | ICD-10-CM

## 2023-04-01 DIAGNOSIS — F10931 Alcohol use, unspecified with withdrawal delirium: Secondary | ICD-10-CM | POA: Diagnosis not present

## 2023-04-01 DIAGNOSIS — E871 Hypo-osmolality and hyponatremia: Secondary | ICD-10-CM

## 2023-04-01 DIAGNOSIS — R160 Hepatomegaly, not elsewhere classified: Secondary | ICD-10-CM

## 2023-04-01 DIAGNOSIS — N171 Acute kidney failure with acute cortical necrosis: Secondary | ICD-10-CM | POA: Diagnosis not present

## 2023-04-01 DIAGNOSIS — D508 Other iron deficiency anemias: Secondary | ICD-10-CM

## 2023-04-01 LAB — COMPREHENSIVE METABOLIC PANEL
ALT: 25 U/L (ref 0–44)
AST: 59 U/L — ABNORMAL HIGH (ref 15–41)
Albumin: 2.6 g/dL — ABNORMAL LOW (ref 3.5–5.0)
Alkaline Phosphatase: 80 U/L (ref 38–126)
Anion gap: 9 (ref 5–15)
BUN: 38 mg/dL — ABNORMAL HIGH (ref 6–20)
CO2: 18 mmol/L — ABNORMAL LOW (ref 22–32)
Calcium: 7.7 mg/dL — ABNORMAL LOW (ref 8.9–10.3)
Chloride: 110 mmol/L (ref 98–111)
Creatinine, Ser: 2.14 mg/dL — ABNORMAL HIGH (ref 0.61–1.24)
GFR, Estimated: 36 mL/min — ABNORMAL LOW (ref >60)
Glucose, Bld: 149 mg/dL — ABNORMAL HIGH (ref 70–99)
Potassium: 3.7 mmol/L (ref 3.5–5.1)
Sodium: 137 mmol/L (ref 135–145)
Total Bilirubin: 9.1 mg/dL — ABNORMAL HIGH (ref 0.3–1.2)
Total Protein: 5.2 g/dL — ABNORMAL LOW (ref 6.5–8.1)

## 2023-04-01 LAB — CBC WITH DIFFERENTIAL/PLATELET
Abs Immature Granulocytes: 0.02 K/uL (ref 0.00–0.07)
Basophils Absolute: 0 K/uL (ref 0.0–0.1)
Basophils Relative: 1 %
Eosinophils Absolute: 0.1 K/uL (ref 0.0–0.5)
Eosinophils Relative: 2 %
HCT: 22.6 % — ABNORMAL LOW (ref 39.0–52.0)
Hemoglobin: 7.3 g/dL — ABNORMAL LOW (ref 13.0–17.0)
Immature Granulocytes: 1 %
Lymphocytes Relative: 13 %
Lymphs Abs: 0.3 K/uL — ABNORMAL LOW (ref 0.7–4.0)
MCH: 34.1 pg — ABNORMAL HIGH (ref 26.0–34.0)
MCHC: 32.3 g/dL (ref 30.0–36.0)
MCV: 105.6 fL — ABNORMAL HIGH (ref 80.0–100.0)
Monocytes Absolute: 0.4 K/uL (ref 0.1–1.0)
Monocytes Relative: 15 %
Neutro Abs: 1.5 K/uL — ABNORMAL LOW (ref 1.7–7.7)
Neutrophils Relative %: 68 %
Platelets: 72 K/uL — ABNORMAL LOW (ref 150–400)
RBC: 2.14 MIL/uL — ABNORMAL LOW (ref 4.22–5.81)
RDW: 18.7 % — ABNORMAL HIGH (ref 11.5–15.5)
WBC: 2.3 K/uL — ABNORMAL LOW (ref 4.0–10.5)
nRBC: 0 % (ref 0.0–0.2)

## 2023-04-01 LAB — PHOSPHORUS: Phosphorus: 2.5 mg/dL (ref 2.5–4.6)

## 2023-04-01 LAB — MAGNESIUM: Magnesium: 2.1 mg/dL (ref 1.7–2.4)

## 2023-04-01 MED ORDER — NADOLOL 20 MG PO TABS
20.0000 mg | ORAL_TABLET | Freq: Every day | ORAL | Status: DC
  Administered 2023-04-02 – 2023-04-08 (×7): 20 mg via ORAL
  Filled 2023-04-01 (×7): qty 1

## 2023-04-01 NOTE — Progress Notes (Signed)
Physical Therapy Treatment Patient Details Name: Martin Anthony MRN: [id number] DOB: [birth year] Today's Date: 04/01/2023   History of Present Illness Patient is a 57 year old male who presented with blood in ileostomy and abdominal pain. Patient was admitted with acute anemia and AMS. Patient underwent upper endoscopy on 10/10 with band ligation. Patient suffered recurrent GI hemorrhaging with associated shock with patient transitioned to ICU with vasopressor support. PMH: EtOH abuse, cirrhosis,    PT Comments   The patient is more alert, less confused, is  reporting has had hallucinations (? In ICU).  Patient adamant about going home. Gait unsteady using RW, but improved today.will be mostly home alone with a  dog. Lives in a 2 level home but does have  bed and bath on first level, steps to enter . Will need practice on steps. Continue PT for  mobility.     If plan is discharge home, recommend the following: A little help with walking and/or transfers;A little help with bathing/dressing/bathroom;Assistance with cooking/housework;Assist for transportation;Help with stairs or ramp for entrance   Can travel by private vehicle        Equipment Recommendations  Rolling walker (2 wheels)    Recommendations for Other Services       Precautions / Restrictions Precautions Precautions: Fall Precaution Comments: colostomy, right foot neuropathy     Mobility  Bed Mobility   Bed Mobility: Supine to Sit, Sit to Supine     Supine to sit: Modified independent (Device/Increase time) Sit to supine: Supervision        Transfers Overall transfer level: Needs assistance Equipment used: Rolling walker (2 wheels) Transfers: Sit to/from Stand Sit to Stand: Contact guard assist           General transfer comment: extra time to rise.    Ambulation/Gait Ambulation/Gait assistance: Min assist Gait Distance (Feet): 50 Feet Assistive device: Rolling walker (2 wheels) Gait  Pattern/deviations: Step-through pattern, Decreased step length - right, Decreased step length - left, Trunk flexed Gait velocity: decr     General Gait Details: noted  tremors in legs with ambulation   Stairs             Wheelchair Mobility     Tilt Bed    Modified Rankin (Stroke Patients Only)       Balance Overall balance assessment: Needs assistance Sitting-balance support: Feet supported, No upper extremity supported Sitting balance-Leahy Scale: Good     Standing balance support: Bilateral upper extremity supported, During functional activity, Reliant on assistive device for balance Standing balance-Leahy Scale: Poor                              Cognition Arousal: Alert Behavior During Therapy: Flat affect Overall Cognitive Status: Impaired/Different from baseline Area of Impairment: Orientation                 Orientation Level: Time             General Comments: much improved MS, follows directions well,        Exercises      General Comments        Pertinent Vitals/Pain Pain Assessment Pain Assessment: Faces Faces Pain Scale: Hurts whole lot Pain Location: abdomen with movement Pain Descriptors / Indicators: Grimacing, Moaning, Constant Pain Intervention(s): Patient requesting pain meds-RN notified    Home Living  Prior Function            PT Goals (current goals can now be found in the care plan section) Acute Rehab PT Goals Patient Stated Goal: home to his dog Progress towards PT goals: Progressing toward goals    Frequency    Min 1X/week      PT Plan      Co-evaluation              AM-PAC PT "6 Clicks" Mobility   Outcome Measure  Help needed turning from your back to your side while in a flat bed without using bedrails?: A Little Help needed moving from lying on your back to sitting on the side of a flat bed without using bedrails?: A Little Help needed  moving to and from a bed to a chair (including a wheelchair)?: A Little Help needed standing up from a chair using your arms (e.g., wheelchair or bedside chair)?: A Little Help needed to walk in hospital room?: A Little Help needed climbing 3-5 steps with a railing? : Total 6 Click Score: 16    End of Session Equipment Utilized During Treatment: Gait belt Activity Tolerance: Patient tolerated treatment well Patient left: in bed;with call bell/phone within reach;with family/visitor present Training and development officer informed that bed alarm did not set, visitor in room) Nurse Communication: Mobility status PT Visit Diagnosis: Unsteadiness on feet (R26.81);Muscle weakness (generalized) (M62.81);Pain     Time: 1610-9604 PT Time Calculation (min) (ACUTE ONLY): 24 min  Charges:    $Gait Training: 23-37 mins PT General Charges $$ ACUTE PT VISIT: 1 Visit                     Blanchard Kelch PT Acute Rehabilitation Services Office [phone] Weekend [phone]    Rada Hay 04/01/2023, 12:27 PM

## 2023-04-01 NOTE — Progress Notes (Signed)
Progress Note   Patient: Martin Anthony:[id number] DOB: June 27, 1965 DOA: 03/23/2023     9 DOS: the patient was seen and examined on 04/01/2023 at 9:45 AM      Brief hospital course: Martin Anthony is a 57 y.o. M with colon CA s/p hemicolectomy and ostomy in place, alcohol dependence, cirrhosis, portal HTN, hx varices who presented with variceal bleed and developed delirium.     Assessment and Plan: * Esophageal varices with bleeding (HCC) Acute blood loss anemia S/p EGD on 03/25/23 with banding x 4 Recurrent bleeding on 10/11 but CTA showed only wall thickening and inflammatory change in duodenum, no extravasation.    Completed octreotide.  GI have signed off.  No reported bleeding - Continue PPI    Streptococcal bacteremia Strep anginosis 2/3 bottles from 10/11 TTE unremarkable.   - Continue Rocephin  - Follow repeat blood culture - Consult ID, appreciate cares - Follow paracentesis culture, so far no growth     Alcohol withdrawal delirium (HCC) Acute metabolic encephalopathy Hepatic encephalopathy Patient developed confusion/disorientation after transfer to ICU that was likely a combination of alcohol withdrawal delirium and hepatic encephalopathy.  Weaned off Precedex.   Started phenobarbital taper, using lower dose, two more days   Improving - Continue CIWA scoring - Continue phenobarbital taper - Continue thiamine and folate - Continue lactulose - Repeat ammonia (normal on 10/11)   Acute renal failure superimposed on stage 2 chronic kidney disease (HCC) Baseline SCr appears to be ~ 1.3 Cr here 1.7 on admission, trended up to high 2s.   Cr stable today post-paracentesis - Trend Cr - Avoid hypotension and nephrotoxins  Alcoholic cirrhosis of liver with ascites (HCC) Alcoholic hepatitis Bleeding esophageal varices Hepatic encephalopathy Thrombocytopenia Ascites MELD Na of 24 on admission. Still with large volume ascites. Steroids held due to bacteremia,  variceal bleeding  Ascites better after para.  Cultures no growth so far.  SBP ruled out with para x2. - Alcohol cessation - Lactulose - Avoid diuretics for now - Plan to resume nadolol if BP remains stable tomorrow    Hyponatremia Na stable   Liver mass - GI recommend nonurgent triple phase CT scan of the abdomen - Follow up with Heme-Onc Dr. Otis Peak  Shock Martin Anthony) Hemorrhagic and possibly septic.  Superimposed on vasoplegia of chronic liver disease.   Echo unremarkable.  Now resolved.     Thrombocytopenia (HCC)    Primary adenocarcinoma of ascending colon (HCC) S/p hemicolectomy 2023.  pT3N0 stage II-III, no adjuvant chemo, in surveillance with [facility], CT abd/pelvis.  - Follow up with Dr. Otis Peak  Type 2 diabetes mellitus with neurological complications (HCC) A1c 5%, well controlled off meds.  No SS corrections needed   Anemia Baseline hemoglobin of 9-11.  See above Hgb stable - Transfusion threshold 7 g/dL  Essential hypertension BP normal today - Hold nadolol, diuretics  - Continue midodrine  Depression with anxiety - Continue Zoloft          Subjective: Patient's appetite is better, has had no further bleeding.  He remains somewhat disorganized in his speech and confused, but oriented to self, while still on hospital, and month.  He has a rough idea of what happened to him, although his answers are somewhat garbled.  No agitation, no seizure     Physical Exam: BP 127/70 (BP Location: Left Arm)   Pulse 69   Temp 98.2 F (36.8 C) (Oral)   Resp 19   Ht 6\' 4"  (1.93 m)   Wt  81.9 kg   SpO2 97%   BMI 21.98 kg/m   Adult male, sitting up, eating breakfast RRR, no murmurs, no peripheral edema Respiratory rate normal, lungs clear without rales or wheezes Abdomen soft, no significant ascites, guards throughout, diffusely tender Psychomotor slowing is noted, answers are somewhat confused, but overall he is oriented, and responds appropriately to questions, and is  goal oriented, upper extremity strength weak but symmetric    Data Reviewed: Basic metabolic panel shows creatinine stable at 2.1, total bilirubin unchanged at 9.1 CBC shows platelets slightly up to 72,000, hemoglobin 7.3  Family Communication: None present    Disposition: Status is: Inpatient         Author: Alberteen Sam, MD 04/01/2023 2:15 PM  For on call review [email or web address].

## 2023-04-01 NOTE — Plan of Care (Signed)
Problem: Clinical Measurements: Goal: Ability to maintain clinical measurements within normal limits will improve Outcome: Progressing   Problem: Coping: Goal: Level of anxiety will decrease Outcome: Progressing

## 2023-04-02 DIAGNOSIS — R7881 Bacteremia: Secondary | ICD-10-CM | POA: Diagnosis not present

## 2023-04-02 DIAGNOSIS — N171 Acute kidney failure with acute cortical necrosis: Secondary | ICD-10-CM | POA: Diagnosis not present

## 2023-04-02 DIAGNOSIS — I8501 Esophageal varices with bleeding: Secondary | ICD-10-CM | POA: Diagnosis not present

## 2023-04-02 DIAGNOSIS — F10931 Alcohol use, unspecified with withdrawal delirium: Secondary | ICD-10-CM | POA: Diagnosis not present

## 2023-04-02 LAB — PHOSPHORUS: Phosphorus: 2.3 mg/dL — ABNORMAL LOW (ref 2.5–4.6)

## 2023-04-02 LAB — BODY FLUID CULTURE W GRAM STAIN: Culture: NO GROWTH

## 2023-04-02 LAB — CBC WITH DIFFERENTIAL/PLATELET
Abs Immature Granulocytes: 0.01 10*3/uL (ref 0.00–0.07)
Basophils Absolute: 0 10*3/uL (ref 0.0–0.1)
Basophils Relative: 1 %
Eosinophils Absolute: 0.1 10*3/uL (ref 0.0–0.5)
Eosinophils Relative: 3 %
HCT: 22.4 % — ABNORMAL LOW (ref 39.0–52.0)
Hemoglobin: 7.1 g/dL — ABNORMAL LOW (ref 13.0–17.0)
Immature Granulocytes: 1 %
Lymphocytes Relative: 15 %
Lymphs Abs: 0.3 10*3/uL — ABNORMAL LOW (ref 0.7–4.0)
MCH: 33.5 pg (ref 26.0–34.0)
MCHC: 31.7 g/dL (ref 30.0–36.0)
MCV: 105.7 fL — ABNORMAL HIGH (ref 80.0–100.0)
Monocytes Absolute: 0.3 10*3/uL (ref 0.1–1.0)
Monocytes Relative: 17 %
Neutro Abs: 1.1 10*3/uL — ABNORMAL LOW (ref 1.7–7.7)
Neutrophils Relative %: 63 %
Platelets: 64 10*3/uL — ABNORMAL LOW (ref 150–400)
RBC: 2.12 MIL/uL — ABNORMAL LOW (ref 4.22–5.81)
RDW: 18.5 % — ABNORMAL HIGH (ref 11.5–15.5)
WBC: 1.8 10*3/uL — ABNORMAL LOW (ref 4.0–10.5)
nRBC: 0 % (ref 0.0–0.2)

## 2023-04-02 LAB — COMPREHENSIVE METABOLIC PANEL
ALT: 22 U/L (ref 0–44)
AST: 61 U/L — ABNORMAL HIGH (ref 15–41)
Albumin: 2.5 g/dL — ABNORMAL LOW (ref 3.5–5.0)
Alkaline Phosphatase: 95 U/L (ref 38–126)
Anion gap: 6 (ref 5–15)
BUN: 29 mg/dL — ABNORMAL HIGH (ref 6–20)
CO2: 20 mmol/L — ABNORMAL LOW (ref 22–32)
Calcium: 7.7 mg/dL — ABNORMAL LOW (ref 8.9–10.3)
Chloride: 109 mmol/L (ref 98–111)
Creatinine, Ser: 1.88 mg/dL — ABNORMAL HIGH (ref 0.61–1.24)
GFR, Estimated: 42 mL/min — ABNORMAL LOW (ref >60)
Glucose, Bld: 130 mg/dL — ABNORMAL HIGH (ref 70–99)
Potassium: 3.9 mmol/L (ref 3.5–5.1)
Sodium: 135 mmol/L (ref 135–145)
Total Bilirubin: 10.2 mg/dL — ABNORMAL HIGH (ref 0.3–1.2)
Total Protein: 5.2 g/dL — ABNORMAL LOW (ref 6.5–8.1)

## 2023-04-02 LAB — AMMONIA: Ammonia: 42 umol/L — ABNORMAL HIGH (ref 9–35)

## 2023-04-02 LAB — MAGNESIUM: Magnesium: 2.1 mg/dL (ref 1.7–2.4)

## 2023-04-02 LAB — PATHOLOGIST SMEAR REVIEW

## 2023-04-02 MED ORDER — PANTOPRAZOLE SODIUM 40 MG PO TBEC
40.0000 mg | DELAYED_RELEASE_TABLET | Freq: Two times a day (BID) | ORAL | Status: DC
  Administered 2023-04-02 – 2023-04-08 (×12): 40 mg via ORAL
  Filled 2023-04-02 (×12): qty 1

## 2023-04-02 MED ORDER — GABAPENTIN 100 MG PO CAPS
100.0000 mg | ORAL_CAPSULE | Freq: Three times a day (TID) | ORAL | Status: DC
  Administered 2023-04-02 – 2023-04-08 (×17): 100 mg via ORAL
  Filled 2023-04-02 (×18): qty 1

## 2023-04-02 NOTE — Plan of Care (Signed)
[facility] Tonsillectomy/Adenoidectomy, Postoperative PEDS care plan entered in error.

## 2023-04-02 NOTE — Progress Notes (Signed)
[facility]    Date of Admission:  03/23/2023   Total days of inpatient antibiotics 5        Reason for Consult: Bacteremia    Principal Problem:   Esophageal varices with bleeding (HCC) Active Problems:   Depression with anxiety   Essential hypertension   Anemia   Acute renal failure superimposed on stage 2 chronic kidney disease (HCC)   Alcoholic cirrhosis of liver with ascites (HCC)   Type 2 diabetes mellitus with neurological complications (HCC)   Primary adenocarcinoma of ascending colon (HCC)   Streptococcal bacteremia   Alcohol withdrawal delirium (HCC)   Thrombocytopenia (HCC)   Shock (HCC)   Liver mass   Hyponatremia   Assessment: 57 year old male with history of alcoholic cirrhosis, CKD stage III, cancer of descending colon status post hemicolectomy admitted with acute GI bleed found to have: #Strep anginosus bacteremia - Patient was admitted with GI bleed.  CT initially did not show findings of acute GI bleed but-show cirrhosis and portal hypertension.  GI was engaged patient underwent EGD on 10/10 esophageal varices were banded.  Hospital course were complicated by decompensated cystitis paracentesis on 10/15 negative for SBP, Cx NG.  Found to have hepatic encephalopathy as well, acute hepatitis panel was negative.  He follows with [facility] and noted to be abstinent from alcohol for about a year.  Colonoscopy 06/23/2021 showed large friable ascending colon mass concerning for probable adenocarcinoma. - ID was engaged as patient admitted on 10/8 and underwent EGD on 10/10.  Blood cultures on 10/11 grew 2/2 strep anginosus.  Patient is on ceftriaxone. - It was noted that patient is not a candidate for EGD given esophageal varices. -Suspect source of bacteremia secondary to translocation from GI bleed.  No recent dental procedures, in fact teeth without any concern of dental caries.  No consistent cellulitis or phlebitis noted. TTE did not  note vegetation as blood Cx cleared and have likely source of bacteremia, will for go TEE at this time.  Recommendations:  -Continue ceftriaxone, anticipate 2 weeks of antibiotics form negative Cx on 10/16 EOT10/29. Sens for strep sent to [facility] so may not be back till next week. Please call ID once sens are back for PO transition options if pt is discharged prior to completeion of abx course.  - Repeat blood cultures from 10/16 NGx2d   ID will sign off Microbiology:   Antibiotics: Ceftriaxone 10/12-present   Cultures: Blood 10/11 2/2 strep anginosus 10/16 NG Peritoneal fluid on 10/15 no growth   Subjective: in bed, eating breakfast. Overnight:afebrile, wbc 1.8k   Review of Systems: Review of Systems  All other systems reviewed and are negative.   Past Medical History:  Diagnosis Date   Anxiety    Ascites due to alcoholic cirrhosis (HCC)    Cirrhosis with alcoholism (HCC)    Depression    Diabetes mellitus without complication (HCC)    GERD (gastroesophageal reflux disease)    Gout    Hyperlipidemia    Hypertension    Primary adenocarcinoma of ascending colon (HCC)    Substance abuse (HCC)    alcoholism    Type 2 diabetes mellitus with diabetic neuropathy (HCC)     Social History   Tobacco Use   Smoking status: Former    Current packs/day: 0.00    Types: Cigarettes    Quit date: 08/11/2001    Years since quitting: 21.6   Smokeless tobacco: Never  Substance Use Topics   Alcohol use: Yes    Alcohol/week: 2.0 standard drinks of alcohol    Types: 2 Standard drinks or equivalent per week   Drug use: No    Family History  Problem Relation Age of Onset   Hypertension Mother    Diabetes Father    Heart attack Father    Pneumonia Father    Hypertension Father    Diabetes Maternal Grandfather    Scheduled Meds:  Chlorhexidine Gluconate Cloth  6 each Topical Q2200   folic acid  1 mg Oral Daily   gabapentin  100 mg Oral BID   lactulose  10 g Oral BID    nadolol  20 mg Oral Daily   nicotine  7 mg Transdermal Daily   pantoprazole (PROTONIX) IV  40 mg Intravenous Q12H   phenobarbital  32.4 mg Oral BID   rifaximin  550 mg Oral BID   sertraline  50 mg Oral QHS   sodium chloride flush  10 mL Intravenous Q12H   sodium chloride flush  3 mL Intravenous Q12H   thiamine  100 mg Oral Daily   Continuous Infusions:  cefTRIAXone (ROCEPHIN)  IV 2 g (04/02/23 0921)   PRN Meds:.influenza vac split trivalent PF, ondansetron **OR** ondansetron (ZOFRAN) IV, mouth rinse, oxyCODONE, simethicone Allergies  Allergen Reactions   Acetaminophen     States he was told not to take it.    Ibuprofen Other (See Comments)    Liver sensitivity    OBJECTIVE: Blood pressure (!) 114/56, pulse 63, temperature 98.6 F (37 C), temperature source Oral, resp. rate 18, height 6\' 4"  (1.93 m), weight 81.9 kg, SpO2 99%.  Physical Exam Constitutional:      General: He is not in acute distress.    Appearance: He is normal weight. He is not toxic-appearing.  HENT:     Head: Normocephalic and atraumatic.     Right Ear: External ear normal.     Left Ear: External ear normal.     Nose: No congestion or rhinorrhea.     Mouth/Throat:     Mouth: Mucous membranes are moist.     Pharynx: Oropharynx is clear.  Eyes:     General: Scleral icterus present.        Right eye: No discharge.     Extraocular Movements: Extraocular movements intact.     Pupils: Pupils are equal, round, and reactive to light.  Cardiovascular:     Rate and Rhythm: Normal rate and regular rhythm.     Heart sounds: No murmur heard.    No friction rub. No gallop.  Pulmonary:     Effort: Pulmonary effort is normal.     Breath sounds: Normal breath sounds.  Abdominal:     General: Abdomen is flat. Bowel sounds are normal.     Palpations: Abdomen is soft.  Musculoskeletal:        General: No swelling. Normal range of motion.     Cervical back: Normal range of motion and neck supple.  Skin:     General: Skin is warm and dry.     Coloration: Skin is jaundiced.  Neurological:     General: No focal deficit present.     Mental Status: He is oriented to person, place, and time.  Psychiatric:        Mood and Affect: Mood normal.     Lab Results Lab Results  Component Value Date   WBC 1.8 (L) 04/02/2023   HGB 7.1 (L) 04/02/2023  HCT 22.4 (L) 04/02/2023   MCV 105.7 (H) 04/02/2023   PLT 64 (L) 04/02/2023    Lab Results  Component Value Date   CREATININE 1.88 (H) 04/02/2023   BUN 29 (H) 04/02/2023   NA 135 04/02/2023   K 3.9 04/02/2023   CL 109 04/02/2023   CO2 20 (L) 04/02/2023    Lab Results  Component Value Date   ALT 22 04/02/2023   AST 61 (H) 04/02/2023   ALKPHOS 95 04/02/2023   BILITOT 10.2 (H) 04/02/2023       Danelle Earthly, MD [facility] 04/02/2023, 11:31 AM   I have personally spent 52 minutes involved in face-to-face and non-face-to-face activities for this patient on the day of the visit. Professional time spent includes the following activities: Preparing to see the patient (review of tests), Obtaining and/or reviewing separately obtained history (admission/discharge record), Performing a medically appropriate examination and/or evaluation , Ordering medications/tests/procedures, referring and communicating with other health care professionals, Documenting clinical information in the EMR, Independently interpreting results (not separately reported), Communicating results to the patient/family/caregiver, Counseling and educating the patient/family/caregiver and Care coordination (not separately reported).

## 2023-04-02 NOTE — TOC CM/SW Note (Signed)
30 Day PASRR Note   Patient Details  Name: Martin Anthony Date of Birth: [birth year]   Transition of Care [facility]) CM/SW Contact:    Lanier Clam, RN Phone Number: 04/02/2023, 2:08 PM  To Whom It May Concern:  Please be advised that this patient will require a short-term nursing home stay - anticipated 30 days or less for rehabilitation and strengthening.   The plan is for return home.

## 2023-04-02 NOTE — TOC Progression Note (Addendum)
Transition of Care [facility]) - Progression Note    Patient Details  Name: Martin Anthony MRN: [id number] Date of Birth: 1966/02/10  [facility]) CM/SW Contact  Dashun Borre, Olegario Messier, RN Phone Number: 04/02/2023, 2:10 PM  Clinical Narrative: faxed out await pasrr & bed offers. -4p-Pasrr added to fl2   [phone] [id number].    Expected Discharge Plan: Skilled Nursing Facility Barriers to Discharge: Continued Medical Work up  Expected Discharge Plan and Services       Living arrangements for the past 2 months: Single Family Home                                       Social Determinants of Health (SDOH) Interventions SDOH Screenings   Food Insecurity: Food Insecurity Present (03/23/2023)  Housing: Low Risk  (03/23/2023)  Transportation Needs: No Transportation Needs (03/23/2023)  Recent Concern: Transportation Needs - Unmet Transportation Needs (03/23/2023)  Utilities: Not At Risk (03/23/2023)  Alcohol Screen: High Risk (12/05/2021)  Depression (PHQ2-9): High Risk (02/02/2023)  Financial Resource Strain: Low Risk  (10/06/2022)   Received from [facility], [facility], [facility], [facility] [place]  Physical Activity: Unknown (12/05/2021)  Social Connections: Unknown (08/11/2022)   Received from [facility], [facility]  Stress: Stress Concern Present (12/05/2021)  Tobacco Use: Medium Risk (03/25/2023)  Health Literacy: Adequate Health Literacy (11/26/2021)   Received from [facility] [place], [facility]  Recent Concern: Health Literacy - Inadequate Health Literacy (11/01/2021)   Received from [facility] [place]    Readmission Risk Interventions     No data to display

## 2023-04-02 NOTE — [place] FL2 (Signed)
[place] MEDICAID FL2 LEVEL OF CARE FORM     IDENTIFICATION  Patient Name: Martin Anthony Birthdate: 18-May-1966 Sex: male Admission Date (Current Location): 03/23/2023  [facility] and [place] Number:  Producer, television/film/video and Address:  [facility],  [address] [place], [place] [address]      Provider Number: [id number]  Attending Physician Name and Address:  Alberteen Sam, *  Relative Name and Phone Number:  Johnhenry Grisso [phone]    Current Level of Care: Hospital Recommended Level of Care: Skilled Nursing Facility Prior Approval Number:    Date Approved/Denied:   PASRR Number:    Discharge Plan: SNF    Current Diagnoses: Patient Active Problem List   Diagnosis Date Noted   Liver mass 03/31/2023   Hyponatremia 03/31/2023   Streptococcal bacteremia 03/30/2023   Alcohol withdrawal delirium (HCC) 03/30/2023   Thrombocytopenia (HCC) 03/30/2023   Shock (HCC) 03/30/2023   Esophageal varices with bleeding (HCC) 03/23/2023   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   Alcoholic cirrhosis of liver with ascites (HCC) 12/05/2021   Type 2 diabetes mellitus with neurological complications (HCC) 12/05/2021   Primary adenocarcinoma of ascending colon (HCC) 12/05/2021   CKD (chronic kidney disease) stage 3, GFR 30-59 ml/min (HCC) 12/05/2021   Pancytopenia (HCC) 12/05/2021   Right inguinal hernia 12/05/2021   Acute renal failure superimposed on stage 2 chronic kidney disease (HCC) 05/12/2021   Rhabdomyolysis 05/12/2021   Elevated LFTs 05/12/2021   Acute encephalopathy 05/12/2021   Ascites    Anemia 01/18/2019   Alcohol abuse 06/25/2015   Gout 10/03/2013   HYPERLIPIDEMIA 05/20/2007   Depression with anxiety 05/20/2007   Essential hypertension 05/20/2007   GERD 05/20/2007    Orientation RESPIRATION BLADDER Height & Weight     Self, Time, Situation, Place  Normal Continent Weight: 81.9 kg Height:  6\' 4"  (193 cm)  BEHAVIORAL SYMPTOMS/MOOD  NEUROLOGICAL BOWEL NUTRITION STATUS      Ileostomy Diet (Regular)  AMBULATORY STATUS COMMUNICATION OF NEEDS Skin   Limited Assist Verbally Normal                       Personal Care Assistance Level of Assistance  Bathing, Feeding, Dressing Bathing Assistance: Limited assistance Feeding assistance: Limited assistance Dressing Assistance: Limited assistance     Functional Limitations Info  Sight, Hearing, Speech Sight Info: Adequate Hearing Info: Adequate Speech Info: Adequate    SPECIAL CARE FACTORS FREQUENCY  PT (By licensed PT), OT (By licensed OT)     PT Frequency: 5x week OT Frequency: 5x week            Contractures Contractures Info: Not present    Additional Factors Info  Code Status, Allergies, Psychotropic Code Status Info: Full Allergies Info: Acetominophen;ibuprofen Psychotropic Info: zoloft         Current Medications (04/02/2023):  This is the current hospital active medication list Current Facility-Administered Medications  Medication Dose Route Frequency Provider Last Rate Last Admin   cefTRIAXone (ROCEPHIN) 2 g in sodium chloride 0.9 % 100 mL IVPB  2 g Intravenous Daily Danelle Earthly, MD 200 mL/hr at 04/02/23 0921 2 g at 04/02/23 0921   Chlorhexidine Gluconate Cloth 2 % PADS 6 each  6 each Topical Q2200 Leslye Peer, MD   6 each at 03/29/23 2207   folic acid (FOLVITE) tablet 1 mg  1 mg Oral Daily Lorin Glass, MD   1 mg at 04/02/23 0906   gabapentin (NEURONTIN) capsule  100 mg  100 mg Oral TID Alberteen Sam, MD       influenza vac split trivalent PF (FLULAVAL) injection 0.5 mL  0.5 mL Intramuscular Prior to discharge Narda Bonds, MD       lactulose (CHRONULAC) 10 GM/15ML solution 10 g  10 g Oral BID Liliane Shi H, DO   10 g at 04/02/23 0908   nadolol (CORGARD) tablet 20 mg  20 mg Oral Daily Alberteen Sam, MD   20 mg at 04/02/23 2841   nicotine (NICODERM CQ - dosed in mg/24 hr) patch 7 mg  7 mg Transdermal Daily  Steffanie Rainwater, MD   7 mg at 04/02/23 0912   ondansetron (ZOFRAN) tablet 4 mg  4 mg Oral Q6H PRN Kerin Salen, MD       Or   ondansetron [facility]) injection 4 mg  4 mg Intravenous Q6H PRN Kerin Salen, MD       Oral care mouth rinse  15 mL Mouth Rinse PRN Narda Bonds, MD       oxyCODONE (Oxy IR/ROXICODONE) immediate release tablet 5 mg  5 mg Oral Q4H PRN Alberteen Sam, MD   5 mg at 04/02/23 0908   pantoprazole (PROTONIX) EC tablet 40 mg  40 mg Oral BID Alberteen Sam, MD       rifaximin [facility]) tablet 550 mg  550 mg Oral BID Kerin Salen, MD   550 mg at 04/02/23 3244   sertraline (ZOLOFT) tablet 50 mg  50 mg Oral QHS Lorin Glass, MD   50 mg at 04/01/23 2220   simethicone (MYLICON) chewable tablet 80 mg  80 mg Oral Q6H PRN Lynann Bologna, DO   80 mg at 03/31/23 1212   sodium chloride flush (NS) 0.9 % injection 10 mL  10 mL Intravenous Q12H Leslye Peer, MD   10 mL at 04/02/23 0102   sodium chloride flush (NS) 0.9 % injection 3 mL  3 mL Intravenous Q12H Kerin Salen, MD   3 mL at 04/02/23 7253   thiamine (VITAMIN B1) tablet 100 mg  100 mg Oral Daily Lorin Glass, MD   100 mg at 04/02/23 6644     Discharge Medications: Please see discharge summary for a list of discharge medications.  Relevant Imaging Results:  Relevant Lab Results:   Additional Information SS#[id number], Olegario Messier, [place]

## 2023-04-02 NOTE — Progress Notes (Signed)
Progress Note   Patient: Martin Anthony YQM:[id number] DOB: [birth year] DOA: 03/23/2023     10 DOS: the patient was seen and examined on 04/02/2023 at 11:21AM      Brief hospital course: Mr. Ewin is a 57 y.o. M with colon CA s/p hemicolectomy and ostomy in place, alcohol dependence, cirrhosis, portal HTN, hx varices who presented with variceal bleeding and delirium     Assessment and Plan: * Esophageal varices with bleeding (HCC) Acute blood loss anemia -Continue PPI    Streptococcal bacteremia -Continue Rocephin, plan for 2 weeks, sensitivities pending     Alcohol withdrawal delirium (HCC) Acute metabolic encephalopathy Hepatic encephalopathy Improving, ammonia level - Continue lactulose and rifaximin - Stop phenobarbital - Continue thiamine and folate   Acute renal failure superimposed on stage 2 chronic kidney disease (HCC) Creatinine slightly better today  Alcoholic cirrhosis of liver with ascites (HCC) Alcoholic hepatitis Bleeding esophageal varices Hepatic encephalopathy Thrombocytopenia Ascites Resume nadolol - Hold diuretics until renal function improves more - Continue lactulose - Alcohol cessation  Liver mass Primary adenocarcinoma of ascending colon (HCC) - Follow up with Dr. Otis Peak  Anemia Hgb trending down - Trend Hgb  Essential hypertension BP stable, midodrine stopped - Hold diuretics  - Continue nadolol  Depression with anxiety - Continue Zoloft          Subjective: Patient is gradually improving.  He is still quite weak, and somewhat confused and sleepy.  But overall improving.  Nursing concerns.  No further bleeding.  Still has vague abdominal discomfort, SBP has been ruled out.     Physical Exam: BP (!) 98/56 (BP Location: Right Arm)   Pulse 63   Temp 98 F (36.7 C) (Oral)   Resp 20   Ht 6\' 4"  (1.93 m)   Wt 81.9 kg   SpO2 100%   BMI 21.98 kg/m   Adult male, lying in bed, jaundiced, sleepy RRR, no murmurs, no  peripheral edema Respiratory rate normal, lungs clear without rales or wheezes Abdomen tender to palpation, with voluntary guarding, but no rigidity, rebound Attention diminished, affect blunted, psychomotor slowing noted, space symmetric, speech fluent, moves upper extremities with generalized weakness but symmetric strength    Data Reviewed: Patient metabolic panel shows creatinine down to 1.8 Total bilirubin up to 10.2 Hemoglobin down to 7.1, platelets down to 64 Discussed with infectious disease  Family Communication: Patient declined    Disposition: Status is: Inpatient         Author: Alberteen Sam, MD 04/02/2023 1:49 PM  For on call review [email or web address].

## 2023-04-02 NOTE — Progress Notes (Signed)
Physical Therapy Treatment Patient Details Name: Martin Anthony MRN: [id number] DOB: 12/31/1965 Today's Date: 04/02/2023   History of Present Illness Patient is a 57 year old male who presented with blood in ileostomy and abdominal pain. Patient was admitted with acute anemia and AMS. Patient underwent upper endoscopy on 10/10 with band ligation. Patient suffered recurrent GI hemorrhaging with associated shock with patient transitioned to ICU with vasopressor support. PMH: EtOH abuse, cirrhosis,    PT Comments  Pt needing increased time to complete tasks. Pt slowly comes to sitting EOB,  verbal cues for sequencing and bedrail use to assist. Pt powers to stand from elevated bed and toilet with min A, increased effort from low toilet using rocking momentum, verbal cues for hand placement to assist and LE activation for all transfers. Pt with unsteady gait to/from restroom, using RW with minimal step progression and intermittent shuffling, pt with significant LOB posterior on return to recliner needing mod A to recover. Pt appears to lack insight to deficits, when discussed unsteadiness and near fall pt states "not that bad". Pt lunch tray present so sitting up in recliner with chair alarm on at EOS.   If plan is discharge home, recommend the following: A little help with walking and/or transfers;A little help with bathing/dressing/bathroom;Assistance with cooking/housework;Assist for transportation;Help with stairs or ramp for entrance   Can travel by private vehicle     Yes  Equipment Recommendations  Rolling walker (2 wheels)    Recommendations for Other Services       Precautions / Restrictions Precautions Precautions: Fall Precaution Comments: colostomy, right foot neuropathy Restrictions Weight Bearing Restrictions: No     Mobility  Bed Mobility   Bed Mobility: Supine to Sit     Supine to sit: Supervision     General bed mobility comments: increased time to come to sitting  EOB, no physical assist, verbal cues for log roll and bedrail to assist    Transfers Overall transfer level: Needs assistance Equipment used: Rolling walker (2 wheels) Transfers: Sit to/from Stand Sit to Stand: Min assist, From elevated surface           General transfer comment: min A to power up from elevated EOB and toilet, heavy verbal cues for LE activation and hand placement, BLE Braced against front of bed    Ambulation/Gait Ambulation/Gait assistance: Min assist, Mod assist Gait Distance (Feet): 15 Feet (x2) Assistive device: Rolling walker (2 wheels) Gait Pattern/deviations: Step-through pattern, Trunk flexed, Decreased stride length Gait velocity: decreased     General Gait Details: slow steps with intermittent shuffling step progression, trunk flexed forward to and from restroom; on return from restroom pt with LOB posterior needing mod A to recover and regain balance, pt lacking insight to deficits stating he could have caught himself   Optometrist     Tilt Bed    Modified Rankin (Stroke Patients Only)       Balance Overall balance assessment: Needs assistance Sitting-balance support: Feet supported, No upper extremity supported Sitting balance-Leahy Scale: Good     Standing balance support: Bilateral upper extremity supported, During functional activity, Reliant on assistive device for balance Standing balance-Leahy Scale: Poor                              Cognition Arousal: Alert Behavior During Therapy: Flat affect Overall Cognitive Status: No family/caregiver present  to determine baseline cognitive functioning                   Orientation Level: Time             General Comments: pt follows commands, appears to lack insight to deficits        Exercises      General Comments        Pertinent Vitals/Pain Pain Assessment Pain Assessment: Faces Faces Pain Scale: Hurts little  more Pain Location: abdomen Pain Descriptors / Indicators: Grimacing, Guarding Pain Intervention(s): Limited activity within patient's tolerance, Monitored during session, Repositioned    Home Living                          Prior Function            PT Goals (current goals can now be found in the care plan section) Acute Rehab PT Goals Patient Stated Goal: home to his dog PT Goal Formulation: With patient Time For Goal Achievement: 04/13/23 Potential to Achieve Goals: Fair Progress towards PT goals: Progressing toward goals    Frequency    Min 1X/week      PT Plan      Co-evaluation              AM-PAC PT "6 Clicks" Mobility   Outcome Measure  Help needed turning from your back to your side while in a flat bed without using bedrails?: A Little Help needed moving from lying on your back to sitting on the side of a flat bed without using bedrails?: A Little Help needed moving to and from a bed to a chair (including a wheelchair)?: A Little Help needed standing up from a chair using your arms (e.g., wheelchair or bedside chair)?: A Little Help needed to walk in hospital room?: A Little Help needed climbing 3-5 steps with a railing? : Total 6 Click Score: 16    End of Session Equipment Utilized During Treatment: Gait belt Activity Tolerance: Patient tolerated treatment well Patient left: in chair;with call bell/phone within reach;with chair alarm set Nurse Communication: Mobility status;Other (comment) (ostomy emptied) PT Visit Diagnosis: Unsteadiness on feet (R26.81);Muscle weakness (generalized) (M62.81);Pain     Time: 1352-1418 PT Time Calculation (min) (ACUTE ONLY): 26 min  Charges:    $Gait Training: 8-22 mins $Therapeutic Activity: 8-22 mins PT General Charges $$ ACUTE PT VISIT: 1 Visit                     Tori Malachi Kinzler PT, DPT 04/02/23, 2:49 PM

## 2023-04-03 DIAGNOSIS — R7881 Bacteremia: Secondary | ICD-10-CM | POA: Diagnosis not present

## 2023-04-03 DIAGNOSIS — F10931 Alcohol use, unspecified with withdrawal delirium: Secondary | ICD-10-CM | POA: Diagnosis not present

## 2023-04-03 DIAGNOSIS — N171 Acute kidney failure with acute cortical necrosis: Secondary | ICD-10-CM | POA: Diagnosis not present

## 2023-04-03 DIAGNOSIS — I8501 Esophageal varices with bleeding: Secondary | ICD-10-CM | POA: Diagnosis not present

## 2023-04-03 LAB — COMPREHENSIVE METABOLIC PANEL
ALT: 25 U/L (ref 0–44)
AST: 65 U/L — ABNORMAL HIGH (ref 15–41)
Albumin: 2.4 g/dL — ABNORMAL LOW (ref 3.5–5.0)
Alkaline Phosphatase: 98 U/L (ref 38–126)
Anion gap: 8 (ref 5–15)
BUN: 29 mg/dL — ABNORMAL HIGH (ref 6–20)
CO2: 17 mmol/L — ABNORMAL LOW (ref 22–32)
Calcium: 8 mg/dL — ABNORMAL LOW (ref 8.9–10.3)
Chloride: 109 mmol/L (ref 98–111)
Creatinine, Ser: 1.69 mg/dL — ABNORMAL HIGH (ref 0.61–1.24)
GFR, Estimated: 47 mL/min — ABNORMAL LOW (ref >60)
Glucose, Bld: 127 mg/dL — ABNORMAL HIGH (ref 70–99)
Potassium: 4.4 mmol/L (ref 3.5–5.1)
Sodium: 134 mmol/L — ABNORMAL LOW (ref 135–145)
Total Bilirubin: 10.9 mg/dL — ABNORMAL HIGH (ref 0.3–1.2)
Total Protein: 5.1 g/dL — ABNORMAL LOW (ref 6.5–8.1)

## 2023-04-03 LAB — CBC WITH DIFFERENTIAL/PLATELET
Abs Immature Granulocytes: 0.02 10*3/uL (ref 0.00–0.07)
Basophils Absolute: 0 10*3/uL (ref 0.0–0.1)
Basophils Relative: 1 %
Eosinophils Absolute: 0.1 10*3/uL (ref 0.0–0.5)
Eosinophils Relative: 4 %
HCT: 22.5 % — ABNORMAL LOW (ref 39.0–52.0)
Hemoglobin: 7.3 g/dL — ABNORMAL LOW (ref 13.0–17.0)
Immature Granulocytes: 1 %
Lymphocytes Relative: 16 %
Lymphs Abs: 0.3 10*3/uL — ABNORMAL LOW (ref 0.7–4.0)
MCH: 33.8 pg (ref 26.0–34.0)
MCHC: 32.4 g/dL (ref 30.0–36.0)
MCV: 104.2 fL — ABNORMAL HIGH (ref 80.0–100.0)
Monocytes Absolute: 0.2 10*3/uL (ref 0.1–1.0)
Monocytes Relative: 14 %
Neutro Abs: 1.1 10*3/uL — ABNORMAL LOW (ref 1.7–7.7)
Neutrophils Relative %: 64 %
Platelets: 61 10*3/uL — ABNORMAL LOW (ref 150–400)
RBC: 2.16 MIL/uL — ABNORMAL LOW (ref 4.22–5.81)
RDW: 18.6 % — ABNORMAL HIGH (ref 11.5–15.5)
WBC: 1.7 10*3/uL — ABNORMAL LOW (ref 4.0–10.5)
nRBC: 0 % (ref 0.0–0.2)

## 2023-04-03 LAB — MAGNESIUM: Magnesium: 2.1 mg/dL (ref 1.7–2.4)

## 2023-04-03 LAB — PHOSPHORUS: Phosphorus: 2.4 mg/dL — ABNORMAL LOW (ref 2.5–4.6)

## 2023-04-03 MED ORDER — LACTULOSE 10 GM/15ML PO SOLN
30.0000 g | Freq: Two times a day (BID) | ORAL | Status: AC
  Administered 2023-04-03 – 2023-04-04 (×4): 30 g via ORAL
  Filled 2023-04-03 (×4): qty 45

## 2023-04-03 MED ORDER — FERROUS SULFATE 325 (65 FE) MG PO TABS
325.0000 mg | ORAL_TABLET | ORAL | Status: DC
  Administered 2023-04-03 – 2023-04-07 (×3): 325 mg via ORAL
  Filled 2023-04-03 (×4): qty 1

## 2023-04-03 NOTE — Progress Notes (Signed)
CSW has reviewed chart at this time there are no bed offers. TOC will have to follow up with facilites on Monday who is considering  pending assessment. TOC will continue to follow.

## 2023-04-03 NOTE — TOC Progression Note (Signed)
Transition of Care [facility] - Progression Note    Patient Details  Name: Martin Anthony MRN: [id number] Date of Birth: 04-03-1966  Transition of Care [facility] CM/SW Contact  Georgie Chard, LCSW Phone Number: 04/03/2023, 12:18 PM  Clinical Narrative:    CSW spoke to Connell from [facility] at this time the liaison is off this weekend. The Liaison who will asses the patient for both [facility] buildings will be Rickard Rhymes who's number is [phone]. Danford Bad reports that Dillion can asses the patient on 04/05/23. TOC to follow up Monday to see a time to visit the patient. TOC will continue to follow.    Expected Discharge Plan: Skilled Nursing Facility Barriers to Discharge: Continued Medical Work up  Expected Discharge Plan and Services       Living arrangements for the past 2 months: Single Family Home                                       Social Determinants of Health (SDOH) Interventions SDOH Screenings   Food Insecurity: Food Insecurity Present (03/23/2023)  Housing: Low Risk  (03/23/2023)  Transportation Needs: No Transportation Needs (03/23/2023)  Recent Concern: Transportation Needs - Unmet Transportation Needs (03/23/2023)  Utilities: Not At Risk (03/23/2023)  Alcohol Screen: High Risk (12/05/2021)  Depression (PHQ2-9): High Risk (02/02/2023)  Financial Resource Strain: Low Risk  (10/06/2022)   Received from [facility], [facility], [facility] [place]  Physical Activity: Unknown (12/05/2021)  Social Connections: Unknown (08/11/2022)   Received from [facility], [facility]  Stress: Stress Concern Present (12/05/2021)  Tobacco Use: Medium Risk (03/25/2023)  Health Literacy: Adequate Health Literacy (11/26/2021)   Received from [facility] [place], [facility]  Recent Concern: Health Literacy - Inadequate Health Literacy (11/01/2021)   Received from [facility] [place]    Readmission Risk  Interventions     No data to display

## 2023-04-03 NOTE — Plan of Care (Signed)
  Problem: Nutrition: Goal: Adequate nutrition will be maintained Outcome: Progressing   Problem: Safety: Goal: Ability to remain free from injury will improve Outcome: Progressing   Problem: Skin Integrity: Goal: Risk for impaired skin integrity will decrease Outcome: Progressing   

## 2023-04-03 NOTE — Progress Notes (Signed)
Resumed care from morning RN. Agree with assessment and will continue with plan of care.

## 2023-04-03 NOTE — Progress Notes (Signed)
Physical Therapy Treatment Patient Details Name: Martin Anthony MRN: [id number] DOB: 1965-07-30 Today's Date: 04/03/2023   History of Present Illness Patient is a 57 year old male who presented with blood in ileostomy and abdominal pain. Patient was admitted with acute anemia and AMS. Patient underwent upper endoscopy on 10/10 with band ligation. Patient suffered recurrent GI hemorrhaging with associated shock with patient transitioned to ICU with vasopressor support. PMH: EtOH abuse, cirrhosis,    PT Comments  Pt assisted with using bathroom and then ambulated in hallway.  Pt continues to require cues for safety and min assist at times for balance.  Pt does not appear safe to d/c home alone (although he would like to return home to his dog, "Chilidog").  Current d/c plan remains appropriate.  Patient will benefit from continued inpatient follow up therapy, <3 hours/day.     If plan is discharge home, recommend the following: A little help with walking and/or transfers;A little help with bathing/dressing/bathroom;Assistance with cooking/housework;Assist for transportation;Help with stairs or ramp for entrance   Can travel by private vehicle     Yes  Equipment Recommendations  Rolling walker (2 wheels)    Recommendations for Other Services       Precautions / Restrictions Precautions Precautions: Fall Precaution Comments: colostomy, right foot neuropathy Restrictions Weight Bearing Restrictions: No     Mobility  Bed Mobility Overal bed mobility: Needs Assistance Bed Mobility: Supine to Sit     Supine to sit: Supervision          Transfers Overall transfer level: Needs assistance Equipment used: None Transfers: Sit to/from Stand Sit to Stand: Min assist           General transfer comment: pt had been sitting to use urinal and bed alarmed so pt assisted with safe rise to standing and RW provided; cues for hand placement upon sitting     Ambulation/Gait Ambulation/Gait assistance: Min assist Gait Distance (Feet): 100 Feet (x2) Assistive device: Rolling walker (2 wheels) Gait Pattern/deviations: Step-through pattern, Trunk flexed, Decreased stride length       General Gait Details: verbal cues for RW positioning and posture; occasional LOB typically posteriorly requiring min assist   Stairs             Wheelchair Mobility     Tilt Bed    Modified Rankin (Stroke Patients Only)       Balance Overall balance assessment: Needs assistance, History of Falls         Standing balance support: Bilateral upper extremity supported, During functional activity, Reliant on assistive device for balance Standing balance-Leahy Scale: Poor                              Cognition Arousal: Alert Behavior During Therapy: Flat affect Overall Cognitive Status: No family/caregiver present to determine baseline cognitive functioning                                 General Comments: pt follows commands, appears to lack insight to deficits; pt does not recall PT yesterday; confirms he does have issues with memory        Exercises      General Comments        Pertinent Vitals/Pain Pain Assessment Pain Assessment: No/denies pain Pain Location: abdomen Pain Intervention(s): Premedicated before session, Repositioned, Monitored during session    Home Living  Prior Function            PT Goals (current goals can now be found in the care plan section) Progress towards PT goals: Progressing toward goals    Frequency    Min 1X/week      PT Plan      Co-evaluation              AM-PAC PT "6 Clicks" Mobility   Outcome Measure  Help needed turning from your back to your side while in a flat bed without using bedrails?: A Little Help needed moving from lying on your back to sitting on the side of a flat bed without using bedrails?: A  Little Help needed moving to and from a bed to a chair (including a wheelchair)?: A Little Help needed standing up from a chair using your arms (e.g., wheelchair or bedside chair)?: A Little Help needed to walk in hospital room?: A Little Help needed climbing 3-5 steps with a railing? : A Lot 6 Click Score: 17    End of Session Equipment Utilized During Treatment: Gait belt Activity Tolerance: Patient tolerated treatment well Patient left: in chair;with call bell/phone within reach;with chair alarm set;with nursing/sitter in room   PT Visit Diagnosis: Unsteadiness on feet (R26.81);Muscle weakness (generalized) (M62.81);Difficulty in walking, not elsewhere classified (R26.2)     Time: 2440-1027 PT Time Calculation (min) (ACUTE ONLY): 20 min  Charges:    $Gait Training: 8-22 mins PT General Charges $$ ACUTE PT VISIT: 1 Visit                    Paulino Door, DPT Physical Therapist Acute Rehabilitation Services Office: [phone]  Janan Halter [place] 04/03/2023, 4:18 PM

## 2023-04-03 NOTE — Plan of Care (Signed)
  Problem: Education: Goal: Knowledge of General Education information will improve Description: Including pain rating scale, medication(s)/side effects and non-pharmacologic comfort measures Outcome: Progressing   Problem: Clinical Measurements: Goal: Diagnostic test results will improve Outcome: Progressing   Problem: Activity: Goal: Risk for activity intolerance will decrease Outcome: Progressing   Problem: Pain Managment: Goal: General experience of comfort will improve Outcome: Progressing   

## 2023-04-03 NOTE — Progress Notes (Signed)
Progress Note   Patient: Martin Anthony ZOX:[id number] DOB: 04/22/1966 DOA: 03/23/2023     11 DOS: the patient was seen and examined on 04/03/2023 at 10:31AM      Brief hospital course: Mr. Singleton is a 57 y.o. M with colon CA s/p hemicolectomy and ostomy in place, alcohol dependence, cirrhosis, portal HTN, hx varices who presented with variceal bleeding and delirium.    Assessment and Plan: * Esophageal varices with bleeding (HCC) Acute blood loss anemia Hemoglobin is stabilized - Start oral iron - Continue PPI - Continue nadolol - Follow hemoglobin every other day    Streptococcal bacteremia -Continue Rocephin, EOT 10/29     Alcohol withdrawal delirium (HCC) Acute metabolic encephalopathy Hepatic encephalopathy Improving. - Phenobarbital stopped - Continue thiamine and folate - Titrate up lactulose - Continue rifaximin   Acute renal failure superimposed on stage 2 chronic kidney disease (HCC) Creatinine trended down to 1.5  Alcoholic cirrhosis of liver with ascites (HCC) Alcoholic hepatitis Bleeding esophageal varices Hepatic encephalopathy Thrombocytopenia Ascites - Continue lactulose and rifaximin - Outpatient GI follow up - Continue nadolol - Avoid diuretics  Liver mass MRI abdomen in April suggested this was a hemangioma, no follow up recommended by patient's Oncologist.  Here, he has had two CTAs of the abdomen and two ultrasounds that were not able to visualize the lesion.   - GI have recommended nonurgent triple phase CT scan of the abdomen - Follow up with Heme-Onc  Essential hypertension BP normal - Hold diuretics - Midodrine stopped - Continue nadolol   Depression with anxiety - Continue Zoloft          Subjective: Patient feels tired, generalized malaise, abdominal pain.  No vomiting, mentation is slightly better.     Physical Exam: BP 125/68 (BP Location: Right Arm)   Pulse 67   Temp 97.7 F (36.5 C) (Oral)   Resp 20    Ht 6\' 4"  (1.93 m)   Wt 86.2 kg   SpO2 100%   BMI 23.13 kg/m   Adult male, lying in bed, jaundiced, sleepy RRR, no murmurs, no peripheral edema Respiratory rate normal, lungs clear without rales or wheezes Abdomen tender to palpation, with voluntary guarding, but no rigidity, rebound Attention diminished, affect blunted, psychomotor slowing noted, space symmetric, speech fluent, moves upper extremities with generalized weakness but symmetric strength  Data Reviewed: Creatinine down to 1.7, total bilirubin up to 10.9 CBC shows hemoglobin 7.3 and stable Platelets 61 and stable  Family Communication: Patient has asked me not to call family    Disposition: Status is: Inpatient         Author: Alberteen Sam, MD 04/03/2023 2:43 PM  For on call review [email or web address].

## 2023-04-04 DIAGNOSIS — I8501 Esophageal varices with bleeding: Secondary | ICD-10-CM | POA: Diagnosis not present

## 2023-04-04 DIAGNOSIS — N171 Acute kidney failure with acute cortical necrosis: Secondary | ICD-10-CM | POA: Diagnosis not present

## 2023-04-04 DIAGNOSIS — F10931 Alcohol use, unspecified with withdrawal delirium: Secondary | ICD-10-CM | POA: Diagnosis not present

## 2023-04-04 DIAGNOSIS — R7881 Bacteremia: Secondary | ICD-10-CM | POA: Diagnosis not present

## 2023-04-04 LAB — SUSCEPTIBILITY, AER + ANAEROB

## 2023-04-04 LAB — SUSCEPTIBILITY RESULT

## 2023-04-04 NOTE — Progress Notes (Signed)
Progress Note   Patient: Martin Anthony JXB:[id number] DOB: September 21, 1965 DOA: 03/23/2023     12 DOS: the patient was seen and examined on 04/04/2023 at 9:20 AM      Brief hospital course: Mr. Murad is a 57 y.o. M with colon CA s/p hemicolectomy and ostomy in place, alcohol dependence, cirrhosis, portal HTN, hx varices who presented with blood in ileostomy.  Patient admitted and underwent EGD with banding of varices.  Developed recurrent hemorrhage with shock requiring vasopressors and transfer to ICU.    While in the ICU, delirium developed requiring Precedex.  This was transitioned to phenobarbital and hemorrhage stabilized.    Subsequent hospital course complicated by persistent encephalopathy, recurrent ascites     Assessment and Plan: * Esophageal varices with bleeding (HCC) Acute blood loss anemia -Repeat CBC - Continue iron - Continue PPI, nadolol - H&H every other day    Streptococcal bacteremia -Continue Rocephin, EOT 10/29     Alcohol withdrawal delirium (HCC) Acute metabolic encephalopathy Hepatic encephalopathy Still somewhat encephalopathic and slowed - Continue lactulose and rifaximin - Continue thiamine and folate   Acute renal failure superimposed on stage 2 chronic kidney disease (HCC) Improved - Check creatinine tomorrow - Resume diuretics in the next couple days if creatinine stable  Alcoholic cirrhosis of liver with ascites (HCC) Alcoholic hepatitis Bleeding esophageal varices Hepatic encephalopathy Thrombocytopenia Ascites -Continue lactulose and rifaximin - Outpatient GI follow-up - Continue nadolol - Resume diuretics in the next couple days  Essential hypertension BP normal today - Hold diuretics  - Continue nadolol - Hold midodrine  Depression with anxiety - Continue Zoloft          Subjective: Patient still has generalized malaise, mild encephalopathy.  He is refusing his lactulose at times because it makes him  nauseated.  He still has abdominal pain and his abdominal swelling seems to be getting worse     Physical Exam: BP 109/62 (BP Location: Right Arm)   Pulse 61   Temp 98.5 F (36.9 C) (Oral)   Resp 14   Ht 6\' 4"  (1.93 m)   Wt 86.2 kg   SpO2 98%   BMI 23.13 kg/m   Thin jaundiced adult male, lying in bed, appears listless and tired RRR, no murmurs, no peripheral edema Respiratory rate normal, lungs clear without rales or wheezes Abdomen more distended with more ascites today, diffusely guarding, slightly tender throughout, nothing focal, nothing localizing Attentive to questions, but psychomotor slowing noted, face symmetric, speech fluent, seems somewhat forgetful    Data Reviewed: No new labs  Family Communication: Patient has asked me not to speak with him till he has had time to talk with them    Disposition: Status is: Inpatient         Author: Alberteen Sam, MD 04/04/2023 1:30 PM  For on call review [email or web address].

## 2023-04-04 NOTE — Progress Notes (Signed)
Occupational Therapy Treatment Patient Details Name: Martin Anthony MRN: [id number] DOB: May 10, 1966 Today's Date: 04/04/2023   History of present illness Patient is a 57 year old male who presented with blood in ileostomy and abdominal pain. Patient was admitted with acute anemia and AMS. Patient underwent upper endoscopy on 10/10 with band ligation. Patient suffered recurrent GI hemorrhaging with associated shock with patient transitioned to ICU with vasopressor support. PMH: EtOH abuse, cirrhosis,   OT comments  Pt reports not feeling well today, but agreeable to OOB to sink for one grooming activity. Cues for log roll technique, but no physical assist for bed mobility. Stood and ambulated with min assist to sink for oral care. Declined ambulation down hall, but agreed to walking in room around bed prior to return to supine. Pt continues to demonstrate impaired cognition, educated in use of TV remote with pt able to return demonstration. Patient will benefit from continued inpatient follow up therapy, <3 hours/day.       If plan is discharge home, recommend the following:  A lot of help with bathing/dressing/bathroom;Assistance with cooking/housework;Direct supervision/assist for medications management;Assist for transportation;Help with stairs or ramp for entrance;Direct supervision/assist for financial management;Assistance with feeding;A little help with walking and/or transfers   Equipment Recommendations  Other (comment) (defer to next venue)    Recommendations for Other Services      Precautions / Restrictions Precautions Precautions: Fall Precaution Comments: colostomy, right foot neuropathy Restrictions Weight Bearing Restrictions: No       Mobility Bed Mobility Overal bed mobility: Needs Assistance Bed Mobility: Rolling, Sidelying to Sit, Sit to Sidelying Rolling: Supervision Sidelying to sit: Supervision     Sit to sidelying: Min assist General bed mobility comments:  HOB flat, use of rail, cues for log roll technique to minimize abdominal pain    Transfers Overall transfer level: Needs assistance Equipment used: Rolling walker (2 wheels) Transfers: Sit to/from Stand Sit to Stand: Min assist           General transfer comment: cues for hand placement, assist to rise and steady     Balance Overall balance assessment: Needs assistance, History of Falls Sitting-balance support: Feet supported, No upper extremity supported Sitting balance-Leahy Scale: Good     Standing balance support: Bilateral upper extremity supported, During functional activity, Reliant on assistive device for balance Standing balance-Leahy Scale: Poor Standing balance comment: min assist without UB support when standing at sink                           ADL either performed or assessed with clinical judgement   ADL Overall ADL's : Needs assistance/impaired     Grooming: Minimal assistance;Standing;Oral care                               Functional mobility during ADLs: Minimal assistance;Rolling walker (2 wheels) General ADL Comments: Educated in how to use TV remote, able to return demo.    Extremity/Trunk Assessment              Vision       Perception     Praxis      Cognition Arousal: Alert Behavior During Therapy: Flat affect Overall Cognitive Status: Impaired/Different from baseline Area of Impairment: Following commands, Problem solving, Memory                     Memory: Decreased short-term  memory       Problem Solving: Slow processing, Decreased initiation, Difficulty sequencing, Requires verbal cues General Comments: pt aware of his memory deficits, but unable to identify compensatory strategies, educated        Exercises      Shoulder Instructions       General Comments      Pertinent Vitals/ Pain       Pain Assessment Pain Assessment: Faces Faces Pain Scale: Hurts little more Pain  Location: abdomen Pain Descriptors / Indicators: Grimacing, Guarding, Sore Pain Intervention(s): Monitored during session, Repositioned  Home Living                                          Prior Functioning/Environment              Frequency  Min 1X/week        Progress Toward Goals  OT Goals(current goals can now be found in the care plan section)  Progress towards OT goals: Progressing toward goals  Acute Rehab OT Goals OT Goal Formulation: Patient unable to participate in goal setting Time For Goal Achievement: 04/13/23 Potential to Achieve Goals: Fair  Plan      Co-evaluation                 AM-PAC OT "6 Clicks" Daily Activity     Outcome Measure   Help from another person eating meals?: None Help from another person taking care of personal grooming?: A Little Help from another person toileting, which includes using toliet, bedpan, or urinal?: A Lot Help from another person bathing (including washing, rinsing, drying)?: A Lot Help from another person to put on and taking off regular upper body clothing?: A Little Help from another person to put on and taking off regular lower body clothing?: A Lot 6 Click Score: 16    End of Session Equipment Utilized During Treatment: Gait belt;Rolling walker (2 wheels)  OT Visit Diagnosis: Unsteadiness on feet (R26.81);Other abnormalities of gait and mobility (R26.89);Pain   Activity Tolerance Patient limited by fatigue   Patient Left in bed;with call bell/phone within reach;with bed alarm set   Nurse Communication          Time: 1000-1023 OT Time Calculation (min): 23 min  Charges: OT General Charges $OT Visit: 1 Visit OT Treatments $Self Care/Home Management : 23-37 mins  Berna Spare, OTR/L Acute Rehabilitation Services Office: [phone]   Evern Bio 04/04/2023, 10:30 AM

## 2023-04-05 ENCOUNTER — Inpatient Hospital Stay (HOSPITAL_COMMUNITY): Payer: Medicaid Other

## 2023-04-05 ENCOUNTER — Other Ambulatory Visit: Payer: Self-pay | Admitting: Family Medicine

## 2023-04-05 DIAGNOSIS — I8501 Esophageal varices with bleeding: Secondary | ICD-10-CM | POA: Diagnosis not present

## 2023-04-05 DIAGNOSIS — N171 Acute kidney failure with acute cortical necrosis: Secondary | ICD-10-CM | POA: Diagnosis not present

## 2023-04-05 DIAGNOSIS — R109 Unspecified abdominal pain: Secondary | ICD-10-CM | POA: Insufficient documentation

## 2023-04-05 DIAGNOSIS — R7881 Bacteremia: Secondary | ICD-10-CM | POA: Diagnosis not present

## 2023-04-05 DIAGNOSIS — F10931 Alcohol use, unspecified with withdrawal delirium: Secondary | ICD-10-CM | POA: Diagnosis not present

## 2023-04-05 LAB — CBC
HCT: 23.7 % — ABNORMAL LOW (ref 39.0–52.0)
Hemoglobin: 7.4 g/dL — ABNORMAL LOW (ref 13.0–17.0)
MCH: 33.3 pg (ref 26.0–34.0)
MCHC: 31.2 g/dL (ref 30.0–36.0)
MCV: 106.8 fL — ABNORMAL HIGH (ref 80.0–100.0)
Platelets: 55 10*3/uL — ABNORMAL LOW (ref 150–400)
RBC: 2.22 MIL/uL — ABNORMAL LOW (ref 4.22–5.81)
RDW: 18.2 % — ABNORMAL HIGH (ref 11.5–15.5)
WBC: 2.4 10*3/uL — ABNORMAL LOW (ref 4.0–10.5)
nRBC: 0 % (ref 0.0–0.2)

## 2023-04-05 LAB — CULTURE, BLOOD (ROUTINE X 2)
Culture: NO GROWTH
Culture: NO GROWTH
Special Requests: ADEQUATE
Special Requests: ADEQUATE

## 2023-04-05 LAB — COMPREHENSIVE METABOLIC PANEL
ALT: 25 U/L (ref 0–44)
AST: 55 U/L — ABNORMAL HIGH (ref 15–41)
Albumin: 2.4 g/dL — ABNORMAL LOW (ref 3.5–5.0)
Alkaline Phosphatase: 101 U/L (ref 38–126)
Anion gap: 6 (ref 5–15)
BUN: 34 mg/dL — ABNORMAL HIGH (ref 6–20)
CO2: 17 mmol/L — ABNORMAL LOW (ref 22–32)
Calcium: 8.2 mg/dL — ABNORMAL LOW (ref 8.9–10.3)
Chloride: 108 mmol/L (ref 98–111)
Creatinine, Ser: 2.16 mg/dL — ABNORMAL HIGH (ref 0.61–1.24)
GFR, Estimated: 35 mL/min — ABNORMAL LOW (ref >60)
Glucose, Bld: 157 mg/dL — ABNORMAL HIGH (ref 70–99)
Potassium: 4.6 mmol/L (ref 3.5–5.1)
Sodium: 131 mmol/L — ABNORMAL LOW (ref 135–145)
Total Bilirubin: 9.7 mg/dL — ABNORMAL HIGH (ref 0.3–1.2)
Total Protein: 5.5 g/dL — ABNORMAL LOW (ref 6.5–8.1)

## 2023-04-05 LAB — AMMONIA: Ammonia: 27 umol/L (ref 9–35)

## 2023-04-05 LAB — PROTIME-INR
INR: 2 — ABNORMAL HIGH (ref 0.8–1.2)
Prothrombin Time: 22.5 s — ABNORMAL HIGH (ref 11.4–15.2)

## 2023-04-05 MED ORDER — ALBUMIN HUMAN 25 % IV SOLN
12.5000 g | Freq: Once | INTRAVENOUS | Status: AC
  Administered 2023-04-05: 12.5 g via INTRAVENOUS
  Filled 2023-04-05: qty 50

## 2023-04-05 MED ORDER — LIDOCAINE HCL 1 % IJ SOLN
INTRAMUSCULAR | Status: AC
  Filled 2023-04-05: qty 20

## 2023-04-05 NOTE — Progress Notes (Signed)
Mobility Specialist - Progress Note   04/05/23 1626  Mobility  Activity Ambulated with assistance to bathroom;Ambulated with assistance in hallway  Level of Assistance Standby assist, set-up cues, supervision of patient - no hands on  Assistive Device Front wheel walker  Distance Ambulated (ft) 200 ft  Range of Motion/Exercises Active  Activity Response Tolerated well  Mobility Referral Yes  $Mobility charge 1 Mobility  Mobility Specialist Start Time (ACUTE ONLY) 1400  Mobility Specialist Stop Time (ACUTE ONLY) 1426  Mobility Specialist Time Calculation (min) (ACUTE ONLY) 26 min   Pt was found in bed and agreeable to ambulate after bathroom use. Pt grew fatigued with session. Pt returned to bed with all needs met. Call bell in reach. RN notified.  Billey Chang Mobility Specialist

## 2023-04-05 NOTE — TOC Progression Note (Addendum)
Transition of Care [facility]) - Progression Note    Patient Details  Name: Martin Anthony MRN: [id number] Date of Birth: 12-17-1965  [facility]) CM/SW Contact  Anija Brickner, Olegario Messier, RN Phone Number: 04/05/2023, 11:31 AM  Clinical Narrative: Rep from [facility] Dylan will do onsite visit to review patient for acceptance.MD/Nsg updated.   -3:29p-[facility] rep Dylan made bed offer-patient still deciding if agree or d/c home-await decision-MD updated.    Expected Discharge Plan: Skilled Nursing Facility Barriers to Discharge: Continued Medical Work up  Expected Discharge Plan and Services       Living arrangements for the past 2 months: Single Family Home                                       Social Determinants of Health (SDOH) Interventions SDOH Screenings   Food Insecurity: Food Insecurity Present (03/23/2023)  Housing: Low Risk  (03/23/2023)  Transportation Needs: No Transportation Needs (03/23/2023)  Recent Concern: Transportation Needs - Unmet Transportation Needs (03/23/2023)  Utilities: Not At Risk (03/23/2023)  Alcohol Screen: High Risk (12/05/2021)  Depression (PHQ2-9): High Risk (02/02/2023)  Financial Resource Strain: Low Risk  (10/06/2022)   Received from [facility], [facility], [facility]  [place]  Physical Activity: Unknown (12/05/2021)  Social Connections: Unknown (08/11/2022)   Received from [facility], [facility]  Stress: Stress Concern Present (12/05/2021)  Tobacco Use: Medium Risk (03/25/2023)  Health Literacy: Adequate Health Literacy (11/26/2021)   Received from [facility] [place], [facility]  Recent Concern: Health Literacy - Inadequate Health Literacy (11/01/2021)   Received from [facility] [place]    Readmission Risk Interventions     No data to display

## 2023-04-05 NOTE — Progress Notes (Signed)
Mobility Specialist - Progress Note   04/05/23 0959  Mobility  Activity Ambulated with assistance to bathroom  Level of Assistance Contact guard assist, steadying assist  Assistive Device Front wheel walker  Distance Ambulated (ft) 20 ft  Range of Motion/Exercises Active  Activity Response Tolerated well  Mobility Referral Yes  $Mobility charge 1 Mobility  Mobility Specialist Start Time (ACUTE ONLY) 0950  Mobility Specialist Stop Time (ACUTE ONLY) 0959  Mobility Specialist Time Calculation (min) (ACUTE ONLY) 9 min   Pt was found in bed an agreeable to ambulate. Assisted to bathroom prior to hallway ambulation. After finished in bathroom transporter to room fro pt. Pt at EOS was left in bed with transporter.   Billey Chang Mobility Specialist

## 2023-04-05 NOTE — Procedures (Signed)
PROCEDURE SUMMARY:  Successful image-guided paracentesis from the LLQ abdomen.  Yielded 2.5 liters of clear yellow fluid.  No immediate complications.  EBL: zero Patient tolerated well.   Specimen was not sent for labs.  Please see imaging section of Epic for full dictation.  Ardith Dark NP-C 04/05/2023 11:43 AM

## 2023-04-05 NOTE — Plan of Care (Signed)
  Problem: Education: Goal: Knowledge of General Education information will improve Description: Including pain rating scale, medication(s)/side effects and non-pharmacologic comfort measures Outcome: Progressing   Problem: Clinical Measurements: Goal: Will remain free from infection Outcome: Progressing   Problem: Activity: Goal: Risk for activity intolerance will decrease Outcome: Progressing   Problem: Nutrition: Goal: Adequate nutrition will be maintained Outcome: Progressing   Problem: Coping: Goal: Level of anxiety will decrease Outcome: Progressing   Problem: Pain Managment: Goal: General experience of comfort will improve Outcome: Progressing   

## 2023-04-05 NOTE — Progress Notes (Signed)
Progress Note   Patient: Martin Anthony UJW:[id number] DOB: 05-13-1966 DOA: 03/23/2023     13 DOS: the patient was seen and examined on 04/05/2023 at 2:20PM      Brief hospital course: Mr. Sabharwal is a 57 y.o. M with colon CA s/p hemicolectomy and ostomy in place, alcohol dependence, cirrhosis, portal HTN, hx varices who presented with variceal bleed and delirium     Assessment and Plan: * Esophageal varices with bleeding (HCC) Acute blood loss anemia Hemoglobin stable - Continue iron, PPI, nadolol    Streptococcal bacteremia -Continue Rocephin     Alcohol withdrawal delirium (HCC) Acute metabolic encephalopathy Hepatic encephalopathy -Continue rifaximin, thiamine, folate - Continue lactulose for now, discussed with GI   Acute renal failure superimposed on stage 2 chronic kidney disease (HCC) Creatinine trended back up to 2.1 today -Give albumin after paracentesis today  Alcoholic cirrhosis of liver with ascites (HCC) Alcoholic hepatitis Bleeding esophageal varices Hepatic encephalopathy Thrombocytopenia Ascites MELDNA 17 last Feb, >30 here.  Not a transplant candidate with active alcohol use.   -Continue rifaximin - Continue nadolol - Paracentesis today with post paracentesis albumin - Resume diuretics if creatinine stable tomorrow - Needs GI follow up   Abdominal pain This has been persistent, diffuse.    No fever, nothing focal.  CT no admission and CT GI bleed on 10/10 showed duodenitis (from peptic ulcer disease, observed on EGD) and some mesenteric findings that are unclear, probably due to portal HTN.  Cytology on one ascites tap this admission was negative for malignancy (given hx colon CA).  SBP ruled out.   - Check AFP - Obtain US to rule out liver mass or PVT    Primary adenocarcinoma of ascending colon (HCC) S/p hemicolectomy 2023.  pT3N0 stage II-III, no adjuvant chemo, in surveillance with [facility], CT abd/pelvis.  - Follow up with Dr. Otis Peak  Type 2  diabetes mellitus with neurological complications (HCC) A1c 5%, well controlled off meds.  No SS corrections needed   Anemia Baseline hemoglobin of 9-11.  See above Hgb stable - Transfusion threshold 7 g/dL  Essential hypertension BP normal today - Hold diuretics  - Continue nadolol - Hold midodrine  Depression with anxiety - Continue Zoloft  Alcoholic neuropathy - Continue gabapentin        Subjective: Patient still has abdominal pain, his mentation slightly better, but still he is very forgetful.  No blood in his ostomy.     Physical Exam: BP 120/66 (BP Location: Right Arm)   Pulse 64   Temp 99 F (37.2 C) (Oral)   Resp 16   Ht 6\' 4"  (1.93 m)   Wt 84.9 kg   SpO2 100%   BMI 22.78 kg/m   Jaundiced adult male, appears debilitated RRR, no murmurs, no peripheral edema Respiratory rate normal, lungs clear without rales or wheezes Abdomen with improved ascites, nonfocal tenderness palpation, ostomy in place Psychomotor slowing is mild, short-term memory seems impaired, face symmetric, speech fluent, strength appears weak but symmetric  Data Reviewed: Patient metabolic panel shows sodium 131, potassium normal, creatinine up to 2.1, bicarb 17, total bilirubin 9.7, no change INR 2 Platelets 55, hemoglobin 7.4, no change  Family Communication: nONE, patient has asked that I not    Disposition: Status is: Inpatient         Author: Alberteen Sam, MD 04/05/2023 5:12 PM  For on call review [email or web address].

## 2023-04-05 NOTE — Assessment & Plan Note (Signed)
This has been persistent, diffuse.    No fever, nothing focal.  CT no admission and CT GI bleed on 10/10 showed duodenitis (from peptic ulcer disease, observed on EGD) and some mesenteric findings that are unclear, probably due to portal HTN.  Cytology on one ascites tap this admission was negative for malignancy (given hx colon CA).  SBP ruled out.   - Check AFP - Obtain US to rule out liver mass or PVT

## 2023-04-06 ENCOUNTER — Inpatient Hospital Stay (HOSPITAL_COMMUNITY): Payer: Medicaid Other

## 2023-04-06 DIAGNOSIS — F10931 Alcohol use, unspecified with withdrawal delirium: Secondary | ICD-10-CM | POA: Diagnosis not present

## 2023-04-06 DIAGNOSIS — R7881 Bacteremia: Secondary | ICD-10-CM | POA: Diagnosis not present

## 2023-04-06 DIAGNOSIS — N171 Acute kidney failure with acute cortical necrosis: Secondary | ICD-10-CM | POA: Diagnosis not present

## 2023-04-06 DIAGNOSIS — I8501 Esophageal varices with bleeding: Secondary | ICD-10-CM | POA: Diagnosis not present

## 2023-04-06 LAB — CBC
HCT: 25 % — ABNORMAL LOW (ref 39.0–52.0)
Hemoglobin: 8 g/dL — ABNORMAL LOW (ref 13.0–17.0)
MCH: 33.8 pg (ref 26.0–34.0)
MCHC: 32 g/dL (ref 30.0–36.0)
MCV: 105.5 fL — ABNORMAL HIGH (ref 80.0–100.0)
Platelets: 61 10*3/uL — ABNORMAL LOW (ref 150–400)
RBC: 2.37 MIL/uL — ABNORMAL LOW (ref 4.22–5.81)
RDW: 17.9 % — ABNORMAL HIGH (ref 11.5–15.5)
WBC: 3.2 10*3/uL — ABNORMAL LOW (ref 4.0–10.5)
nRBC: 0 % (ref 0.0–0.2)

## 2023-04-06 LAB — PROTIME-INR
INR: 1.9 — ABNORMAL HIGH (ref 0.8–1.2)
Prothrombin Time: 22.2 s — ABNORMAL HIGH (ref 11.4–15.2)

## 2023-04-06 LAB — COMPREHENSIVE METABOLIC PANEL
ALT: 26 U/L (ref 0–44)
AST: 70 U/L — ABNORMAL HIGH (ref 15–41)
Albumin: 2.5 g/dL — ABNORMAL LOW (ref 3.5–5.0)
Alkaline Phosphatase: 107 U/L (ref 38–126)
Anion gap: 9 (ref 5–15)
BUN: 32 mg/dL — ABNORMAL HIGH (ref 6–20)
CO2: 16 mmol/L — ABNORMAL LOW (ref 22–32)
Calcium: 8.5 mg/dL — ABNORMAL LOW (ref 8.9–10.3)
Chloride: 107 mmol/L (ref 98–111)
Creatinine, Ser: 2.27 mg/dL — ABNORMAL HIGH (ref 0.61–1.24)
GFR, Estimated: 33 mL/min — ABNORMAL LOW (ref >60)
Glucose, Bld: 136 mg/dL — ABNORMAL HIGH (ref 70–99)
Potassium: 4.7 mmol/L (ref 3.5–5.1)
Sodium: 132 mmol/L — ABNORMAL LOW (ref 135–145)
Total Bilirubin: 9.6 mg/dL — ABNORMAL HIGH (ref 0.3–1.2)
Total Protein: 6 g/dL — ABNORMAL LOW (ref 6.5–8.1)

## 2023-04-06 LAB — VITAMIN B12: Vitamin B-12: 2405 pg/mL — ABNORMAL HIGH (ref 180–914)

## 2023-04-06 MED ORDER — MIDODRINE HCL 5 MG PO TABS
2.5000 mg | ORAL_TABLET | Freq: Three times a day (TID) | ORAL | Status: DC
Start: 2023-04-07 — End: 2023-04-08
  Administered 2023-04-07 – 2023-04-08 (×4): 2.5 mg via ORAL
  Filled 2023-04-06 (×4): qty 1

## 2023-04-06 MED ORDER — CEFADROXIL 500 MG PO CAPS
1000.0000 mg | ORAL_CAPSULE | Freq: Two times a day (BID) | ORAL | Status: DC
  Administered 2023-04-06 – 2023-04-08 (×5): 1000 mg via ORAL
  Filled 2023-04-06 (×5): qty 2

## 2023-04-06 MED ORDER — SPIRONOLACTONE 12.5 MG HALF TABLET
12.5000 mg | ORAL_TABLET | Freq: Every day | ORAL | Status: DC
  Administered 2023-04-06 – 2023-04-08 (×3): 12.5 mg via ORAL
  Filled 2023-04-06 (×3): qty 1

## 2023-04-06 MED ORDER — FUROSEMIDE 20 MG PO TABS
20.0000 mg | ORAL_TABLET | Freq: Every day | ORAL | Status: DC
  Administered 2023-04-06 – 2023-04-08 (×3): 20 mg via ORAL
  Filled 2023-04-06 (×3): qty 1

## 2023-04-06 NOTE — Progress Notes (Signed)
Occupational Therapy Treatment Patient Details Name: Martin Anthony MRN: [id number] DOB: [birth year] Today's Date: 04/06/2023   History of present illness Patient is a 57 year old male who presented with blood in ileostomy and abdominal pain. Patient was admitted with acute anemia and AMS. Patient underwent upper endoscopy on 10/10 with band ligation. Patient suffered recurrent GI hemorrhaging with associated shock with patient transitioned to ICU with vasopressor support. PMH: EtOH abuse, cirrhosis,   OT comments  Pt was motivated towards therapy. He was instructed on compensatory strategies for lower body dressing. The session further emphasized functional strengthening, safety education during progressive activity, and increasing functional activity. He required SBA to CGA for out of bed transfers using a RW, as well as for ambulating using the RW. He reported feelings of slight generalized weakness and increased pain and colostomy site. He is making gradual progress. Continue OT plan of care.       If plan is discharge home, recommend the following:  Help with stairs or ramp for entrance;Assist for transportation;Assistance with cooking/housework;A little help with bathing/dressing/bathroom   Equipment Recommendations  None recommended by OT    Recommendations for Other Services      Precautions / Restrictions Precautions Precautions: Fall Precaution Comments: colostomy, right foot neuropathy Restrictions Weight Bearing Restrictions: No       Mobility Bed Mobility Overal bed mobility: Needs Assistance Bed Mobility: Supine to Sit     Supine to sit: Supervision          Transfers Overall transfer level: Needs assistance Equipment used: Rolling walker (2 wheels) Transfers: Sit to/from Stand Sit to Stand: Contact guard assist                     ADL either performed or assessed with clinical judgement   ADL Overall ADL's : Needs  assistance/impaired Eating/Feeding: Set up;Sitting Eating/Feeding Details (indicate cue type and reason): pt observed self feeding in  bed upon OT entering his room Grooming: Set up;Sitting           Upper Body Dressing : Set up;Sitting   Lower Body Dressing: Contact guard assist Lower Body Dressing Details (indicate cue type and reason): OT instructed pt on implementing the figure 4 technique for lower body dressing. He performed teachback seated EOB, doffing and donning socks. Toilet Transfer: Contact guard assist;Ambulation;Rolling walker (2 wheels);Grab Paramedic Details (indicate cue type and reason): based on clinical judgement                           Cognition Arousal: Alert Behavior During Therapy: WFL for tasks assessed/performed Overall Cognitive Status: Within Functional Limits for tasks assessed                      Pertinent Vitals/ Pain       Pain Assessment Pain Assessment: 0-10 Pain Score: 7  Pain Location: colostomy site Pain Intervention(s): Limited activity within patient's tolerance, Monitored during session         Frequency  Min 1X/week        Progress Toward Goals  OT Goals(current goals can now be found in the care plan section)  Progress towards OT goals: Progressing toward goals  Acute Rehab OT Goals Patient Stated Goal: to get better and return home OT Goal Formulation: With patient Time For Goal Achievement: 04/13/23 Potential to Achieve Goals: Good  Plan         AM-PAC  OT "6 Clicks" Daily Activity     Outcome Measure   Help from another person eating meals?: None Help from another person taking care of personal grooming?: A Little Help from another person toileting, which includes using toliet, bedpan, or urinal?: A Little Help from another person bathing (including washing, rinsing, drying)?: A Little Help from another person to put on and taking off regular upper body clothing?: A  Little Help from another person to put on and taking off regular lower body clothing?: A Little 6 Click Score: 19    End of Session Equipment Utilized During Treatment: Rolling walker (2 wheels)  OT Visit Diagnosis: Unsteadiness on feet (R26.81);Other abnormalities of gait and mobility (R26.89);Pain   Activity Tolerance Patient tolerated treatment well   Patient Left in bed;with call bell/phone within reach   Nurse Communication Mobility status        Time: 1710-1732 OT Time Calculation (min): 22 min  Charges: OT General Charges $OT Visit: 1 Visit OT Treatments $Therapeutic Activity: 8-22 mins   Reuben Likes 04/06/2023, 5:43 PM

## 2023-04-06 NOTE — Progress Notes (Signed)
Progress Note   Patient: Martin Anthony ZOX:[id number] DOB: 1966/05/19 DOA: 03/23/2023     14 DOS: the patient was seen and examined on 04/06/2023 at 10:45AM and 2:15PM      Brief hospital course: [name] is a 57 y.o. M with advanced cirrhosis, colon CA s/p hemicolectomy in 2023 and ostomy in place, persistent alcohol dependence, portal HTN, hx varices who presented with blood in ileostomy.  Patient admitted and underwent EGD with banding of varices.  Developed recurrent hemorrhage with shock requiring vasopressors and transfer to ICU.    While in the ICU, delirium developed requiring Precedex.  This was transitioned to phenobarbital and hemorrhage stabilized.    Subsequent hospital course complicated by persistent encephalopathy, recurrent ascites, streptococcal bacteremia     Assessment and Plan: * Esophageal varices with bleeding (HCC) Acute blood loss anemia S/p EGD on 03/25/23 with banding x 4  Did have recurrent bleeding on 10/11 but CTA showed only wall thickening and inflammatory change in duodenum, no extravasation.    Completed octreotide.  GI have signed off.  Globin has remained stable and is trending up - Continue iron, PPI, nadolol     Streptococcal bacteremia Found to have strep anginosis 2/3 bottles from 10/11 TTE unremarkable.  You were consulted and recommended 2 weeks therapy, to finish with cefadroxil - Continue cefadroxil, 1g BID EOT 10/29   Alcohol withdrawal delirium (HCC) Acute metabolic encephalopathy Hepatic encephalopathy Patient developed confusion/disorientation after transfer to ICU that was likely a combination of alcohol withdrawal delirium and hepatic encephalopathy.   Weaned off Precedex.  Completed phenobarbital taper.  His encephalopathy is improved, ammonia is down to normal, but he has some residual memory loss and cognitive impairment, which may be his baseline hepatic encephalopathy or may be cognitive impairment from alcohol and  may not resolve.  At present, I am concerned about his safety alone.  I believe he is decisional, but at present he is refusing to allow [name] to speak with mother, sister or friends to start to plan for discharge.  - Continue thiamine and folate - PT/OT - TOC consult for SNF - Palliative Care consult for goals of care    Chronic kidney disease stage IV AKI ruled out Baseline SCr appears to be ~ 1.3 when he is off diuretics, but >2 when he is on diuretics (e.g. last Feb when seen by his hepatologist in [place]). Cr here 1.7 on admission, trending up and down between 1.7 and 2.5  I suspect his baseline with diuretics will end up being in the 2+ range.    Alcoholic cirrhosis of liver with ascites (HCC) Alcoholic hepatitis Bleeding esophageal varices Hepatic encephalopathy Thrombocytopenia MELD Na of 24 on admission.  Steroids held due to bacteremia, variceal bleeding.  MELDNa was 17 at last hepatology appointment, but he resumed drinking some time after that.  He has poor insight into his advancing liver disease and shared with me that he knows he should stop "although I'd like to be the kind of person who is able to drink at Thanksgiving or Christmas".  MELD has waxed and waned, but the last few days is more like 32.  I have shared with him that he has a high risk of death, in the 25% range, but certain death if he drinks.  His memory is poor and he is somewhat in denial.  Not a transplant candidate with active alcohol and cancer. - Continue rifaximin - Continue nadolol - Low sodium diet - Alcohol cessation - Consult  Palliative care; hospice would be appropriate - If survives, will need local GI follow up with [facility] at discharge   Ascites Cultures no growth so far.  SBP ruled out with para x2.  Has needed paracentesis x2 during this admission for ascites.  Given that, will need to resume diuretics and tolerate higher Cr. - Resume Lasix and spironolactone  - Resume low dose  midodrine with diuretics - Close monitoring Cr    Abdominal pain This has been persistent, diffuse.    No fever, nothing focal.  CT no admission and CT GI bleed on 10/10 showed duodenitis (from peptic ulcer disease, observed on EGD) and some mesenteric findings that are unclear, probably due to portal HTN.    Cytology on one ascites tap this admission was negative for malignancy (given hx colon CA).  SBP ruled out.  US liver today ruled out enlarging mass, ruled out PVT - Follow AFP - Avoid excessive opiates  Hyponatremia Na stable   Liver mass Noted to have mass on imaging with [facility] last spring.  MRI abdomen in April suggested this was a hemangioma, no follow up recommended by patient's Oncologist.    Here, he has had two CTAs of the abdomen and two ultrasounds that were not able to visualize the lesion.   - GI have recommended nonurgent triple phase CT scan of the abdomen if renal function allows and if goals of care are consistent    Pancytopenia Thrombocytopenia Due to alcohol, counts stable today. - Alcohol cessation    Shock (HCC) Hemorrhagic and possibly septic.  Superimposed on vasoplegia of chronic liver disease.   Echo unremarkable.  Now resolved.      Primary adenocarcinoma of ascending colon (HCC) S/p hemicolectomy 2023.  pT3N0 stage II-III, no adjuvant chemo, in surveillance with [facility], CT abd/pelvis.  - Will need local Heme-Onc follow up after discharge  Type 2 diabetes mellitus with neurological complications (HCC) A1c 5%, well controlled off meds.  No SS corrections needed   Anemia Baseline hemoglobin of 9-11.    Hgb stable - Transfusion threshold 7 g/dL  Essential hypertension BP low normal - Monitor BP on diuretics - Continue nadolol - Resume midodrine  Depression with anxiety - Continue Zoloft          Subjective: Patient remains forgetful, weak, with abdominal pain.  He has had no vomiting, is tolerating diet well.  His  thinking is no clear than yesterday.  He said no fever, no vomiting, no bleeding anywhere.     Physical Exam: BP (!) 100/52 (BP Location: Right Arm) Comment: primary nurse aware  Pulse (!) 59 Comment: primary nurse aware  Temp 98.3 F (36.8 C) (Oral)   Resp 16   Ht 6\' 4"  (1.93 m)   Wt 84 kg   SpO2 100%   BMI 22.54 kg/m   Thin, chronically ill-appearing, jaundiced adult male, lying in bed, interactive and appropriate RRR, no murmurs, no peripheral edema Respiratory rate normal, lungs clear without rales or wheezes Abdomen with ileostomy in place, mild diffuse tenderness to palpation, no ascites at this time Attentive to questions, oriented to person, place, and time, but very forgetful, mild psychomotor slowing, very weak.  Able to stand, but shuffling gait with a walker     Data Reviewed: Abdomen ultrasound reviewed, unremarkable B12 replete Hemoglobin up to 8 Platelets 61,000 INR 1.9 Creatinine 2.2 Bicarb 16 Total bilirubin 9.6, no change   Family Communication: Patient has refused to allow [name] to call  Disposition: Status is: Inpatient Patient with decompensated liver failure, variceal bleed  Bleeding stopped, but overall, his liver failure seems to be worsening, and he has very poor prognosis.  Is unclear at this time if he can go home alone, or will consent to go to SNF.  We will enlist palliative care to continue goals of care discussion with him as he may need hospice.        Author: Alberteen Sam, MD 04/06/2023 7:23 PM  For on call review [email or web address].

## 2023-04-06 NOTE — Progress Notes (Signed)
Physical Therapy Treatment Patient Details Name: DAMION JAUCH MRN: [id number] DOB: [birth year] Today's Date: 04/06/2023   History of Present Illness Patient is a 57 year old male who presented with blood in ileostomy and abdominal pain. Patient was admitted with acute anemia and AMS. Patient underwent upper endoscopy on 10/10 with band ligation. Patient suffered recurrent GI hemorrhaging with associated shock with patient transitioned to ICU with vasopressor support. PMH: EtOH abuse, cirrhosis,    PT Comments  Pt resting upon arrival, agreeable to ambulate with therapist. Pt amb 200 ft with RW, needing 1 standing rest break halfway. Elevated RW to improve pt's upright posture, no overt LOB, needing increased time with turns. Pt reports chronic pain, denies dizziness or SOB with ambulation. Pt returns to supine upon return to room, all needs in reach, and encouraged to amb again today with nursing as able. Pt vague regarding home support, reports home alone with dog.    If plan is discharge home, recommend the following: A little help with walking and/or transfers;A little help with bathing/dressing/bathroom;Assistance with cooking/housework;Assist for transportation;Help with stairs or ramp for entrance   Can travel by private vehicle     Yes  Equipment Recommendations  Rolling walker (2 wheels)    Recommendations for Other Services       Precautions / Restrictions Precautions Precautions: Fall Precaution Comments: colostomy, right foot neuropathy Restrictions Weight Bearing Restrictions: No     Mobility  Bed Mobility Overal bed mobility: Needs Assistance Bed Mobility: Sit to Supine, Sidelying to Sit   Sidelying to sit: Supervision, HOB elevated, Used rails   Sit to supine: Supervision   General bed mobility comments: elevated HOB, supv to upright into sitting and return to supine, no assist    Transfers Overall transfer level: Needs assistance Equipment used: Rolling  walker (2 wheels) Transfers: Sit to/from Stand Sit to Stand: Contact guard assist           General transfer comment: pt powers to stand from EOB x2 reps with BUE assisting, CGA, able to rise from regular set bed which is short for pt's height    Ambulation/Gait Ambulation/Gait assistance: Contact guard assist Gait Distance (Feet): 200 Feet Assistive device: Rolling walker (2 wheels) Gait Pattern/deviations: Step-through pattern, Decreased stride length Gait velocity: decreased     General Gait Details: elevated RW and pt able to amb with more upright posture, reports "chronic" pain without anything new, step through gait pattern with decreased cadence, no overt LOB, increased time with turns   Optometrist     Tilt Bed    Modified Rankin (Stroke Patients Only)       Balance Overall balance assessment: Needs assistance, History of Falls Sitting-balance support: Feet supported, No upper extremity supported Sitting balance-Leahy Scale: Good     Standing balance support: Bilateral upper extremity supported, During functional activity, Reliant on assistive device for balance Standing balance-Leahy Scale: Fair Standing balance comment: static able to release BUE, dynamic with RW                            Cognition Arousal: Alert Behavior During Therapy: WFL for tasks assessed/performed Overall Cognitive Status: Within Functional Limits for tasks assessed  General Comments: pt pleasant, follows commands        Exercises      General Comments        Pertinent Vitals/Pain Pain Assessment Pain Assessment: Faces Faces Pain Scale: Hurts a little bit Pain Location: generalized Pain Descriptors / Indicators: Discomfort Pain Intervention(s): Monitored during session, Repositioned    Home Living                          Prior Function            PT Goals  (current goals can now be found in the care plan section) Acute Rehab PT Goals Patient Stated Goal: home to his dog PT Goal Formulation: With patient Time For Goal Achievement: 04/13/23 Potential to Achieve Goals: Fair Progress towards PT goals: Progressing toward goals    Frequency    Min 1X/week      PT Plan      Co-evaluation              AM-PAC PT "6 Clicks" Mobility   Outcome Measure  Help needed turning from your back to your side while in a flat bed without using bedrails?: A Little Help needed moving from lying on your back to sitting on the side of a flat bed without using bedrails?: A Little Help needed moving to and from a bed to a chair (including a wheelchair)?: A Little Help needed standing up from a chair using your arms (e.g., wheelchair or bedside chair)?: A Little Help needed to walk in hospital room?: A Little Help needed climbing 3-5 steps with a railing? : A Lot 6 Click Score: 17    End of Session Equipment Utilized During Treatment: Gait belt Activity Tolerance: Patient tolerated treatment well Patient left: in bed;with call bell/phone within reach;with bed alarm set Nurse Communication: Mobility status PT Visit Diagnosis: Unsteadiness on feet (R26.81);Muscle weakness (generalized) (M62.81);Difficulty in walking, not elsewhere classified (R26.2)     Time: 0102-7253 PT Time Calculation (min) (ACUTE ONLY): 15 min  Charges:    $Gait Training: 8-22 mins PT General Charges $$ ACUTE PT VISIT: 1 Visit                     Tori Ryliegh Mcduffey PT, DPT 04/06/23, 2:38 PM

## 2023-04-06 NOTE — Plan of Care (Signed)
°  Problem: Clinical Measurements: °Goal: Diagnostic test results will improve °Outcome: Progressing °  °Problem: Activity: °Goal: Risk for activity intolerance will decrease °Outcome: Progressing °  °Problem: Coping: °Goal: Level of anxiety will decrease °Outcome: Progressing °  °Problem: Pain Managment: °Goal: General experience of comfort will improve °Outcome: Progressing °  °

## 2023-04-07 DIAGNOSIS — K2991 Gastroduodenitis, unspecified, with bleeding: Secondary | ICD-10-CM

## 2023-04-07 DIAGNOSIS — R1084 Generalized abdominal pain: Secondary | ICD-10-CM

## 2023-04-07 DIAGNOSIS — Z515 Encounter for palliative care: Secondary | ICD-10-CM | POA: Diagnosis not present

## 2023-04-07 DIAGNOSIS — Z7189 Other specified counseling: Secondary | ICD-10-CM

## 2023-04-07 DIAGNOSIS — I8501 Esophageal varices with bleeding: Secondary | ICD-10-CM | POA: Diagnosis not present

## 2023-04-07 LAB — COMPREHENSIVE METABOLIC PANEL
ALT: 25 U/L (ref 0–44)
AST: 71 U/L — ABNORMAL HIGH (ref 15–41)
Albumin: 2.5 g/dL — ABNORMAL LOW (ref 3.5–5.0)
Alkaline Phosphatase: 117 U/L (ref 38–126)
Anion gap: 7 (ref 5–15)
BUN: 37 mg/dL — ABNORMAL HIGH (ref 6–20)
CO2: 17 mmol/L — ABNORMAL LOW (ref 22–32)
Calcium: 8.4 mg/dL — ABNORMAL LOW (ref 8.9–10.3)
Chloride: 105 mmol/L (ref 98–111)
Creatinine, Ser: 2.24 mg/dL — ABNORMAL HIGH (ref 0.61–1.24)
GFR, Estimated: 34 mL/min — ABNORMAL LOW (ref >60)
Glucose, Bld: 122 mg/dL — ABNORMAL HIGH (ref 70–99)
Potassium: 4.8 mmol/L (ref 3.5–5.1)
Sodium: 129 mmol/L — ABNORMAL LOW (ref 135–145)
Total Bilirubin: 9.4 mg/dL — ABNORMAL HIGH (ref 0.3–1.2)
Total Protein: 5.9 g/dL — ABNORMAL LOW (ref 6.5–8.1)

## 2023-04-07 LAB — CBC
HCT: 24.6 % — ABNORMAL LOW (ref 39.0–52.0)
Hemoglobin: 8 g/dL — ABNORMAL LOW (ref 13.0–17.0)
MCH: 33.9 pg (ref 26.0–34.0)
MCHC: 32.5 g/dL (ref 30.0–36.0)
MCV: 104.2 fL — ABNORMAL HIGH (ref 80.0–100.0)
Platelets: 71 10*3/uL — ABNORMAL LOW (ref 150–400)
RBC: 2.36 MIL/uL — ABNORMAL LOW (ref 4.22–5.81)
RDW: 17.4 % — ABNORMAL HIGH (ref 11.5–15.5)
WBC: 4.3 10*3/uL (ref 4.0–10.5)
nRBC: 0 % (ref 0.0–0.2)

## 2023-04-07 LAB — AMMONIA: Ammonia: 43 umol/L — ABNORMAL HIGH (ref 9–35)

## 2023-04-07 LAB — CULTURE, BLOOD (ROUTINE X 2)

## 2023-04-07 LAB — AFP TUMOR MARKER: AFP, Serum, Tumor Marker: 3.3 ng/mL (ref 0.0–8.4)

## 2023-04-07 LAB — PROTIME-INR
INR: 1.8 — ABNORMAL HIGH (ref 0.8–1.2)
Prothrombin Time: 21.5 s — ABNORMAL HIGH (ref 11.4–15.2)

## 2023-04-07 NOTE — TOC Progression Note (Signed)
Transition of Care [facility]) - Progression Note    Patient Details  Name: Martin Anthony MRN: [id number] Date of Birth: Oct 17, 1965  [facility]) CM/SW Contact  Marybell Robards, Olegario Messier, RN Phone Number: 04/07/2023, 12:12 PM  Clinical Narrative:  Spoke to patient about bed offer for [facility] in [place]-patient declines prefers home. Informed of no HHC services d/t insurance. He understands & agrees to home. He will try to find his own transport home. MD updated.     Expected Discharge Plan: Home/Self Care Barriers to Discharge: Continued Medical Work up  Expected Discharge Plan and Services       Living arrangements for the past 2 months: Single Family Home                                       Social Determinants of Health (SDOH) Interventions SDOH Screenings   Food Insecurity: Food Insecurity Present (03/23/2023)  Housing: Low Risk  (03/23/2023)  Transportation Needs: No Transportation Needs (03/23/2023)  Recent Concern: Transportation Needs - Unmet Transportation Needs (03/23/2023)  Utilities: Not At Risk (03/23/2023)  Alcohol Screen: High Risk (12/05/2021)  Depression (PHQ2-9): High Risk (02/02/2023)  Financial Resource Strain: Low Risk  (10/06/2022)   Received from [facility], [facility], [facility], [facility] [place] [place]  Physical Activity: Unknown (12/05/2021)  Social Connections: Unknown (08/11/2022)   Received from [facility], [facility]  Stress: Stress Concern Present (12/05/2021)  Tobacco Use: Medium Risk (03/25/2023)  Health Literacy: Adequate Health Literacy (11/26/2021)   Received from [facility] [place], [facility]  Recent Concern: Health Literacy - Inadequate Health Literacy (11/01/2021)   Received from [facility] [place]    Readmission Risk Interventions     No data to display

## 2023-04-07 NOTE — Plan of Care (Signed)
  Problem: Activity: ?Goal: Risk for activity intolerance will decrease ?Outcome: Progressing ?  ?Problem: Pain Managment: ?Goal: General experience of comfort will improve ?Outcome: Progressing ?  ?Problem: Safety: ?Goal: Ability to remain free from injury will improve ?Outcome: Progressing ?  ?

## 2023-04-07 NOTE — Progress Notes (Signed)
PROGRESS NOTE    Martin Anthony  ZOX:[id number] DOB: Jul 31, 1965 DOA: 03/23/2023 PCP: Nelwyn Salisbury, MD   Brief Narrative:  Mr. Schaul is a 57 y.o. M with advanced cirrhosis, colon CA s/p hemicolectomy in 2023 and ostomy in place, persistent alcohol dependence, portal HTN, hx varices who presented with blood in ileostomy.   Patient admitted and underwent EGD with banding of varices.  Developed recurrent hemorrhage with shock requiring vasopressors and transfer to ICU.     While in the ICU, delirium developed requiring Precedex.  This was transitioned to phenobarbital and hemorrhage stabilized.     Subsequent hospital course complicated by persistent encephalopathy, recurrent ascites, streptococcal bacteremia  Assessment & Plan:   Principal Problem:   Esophageal varices with bleeding (HCC) Active Problems:   Streptococcal bacteremia   Alcohol withdrawal delirium (HCC)   Acute renal failure superimposed on stage 2 chronic kidney disease (HCC)   Alcoholic cirrhosis of liver with ascites (HCC)   Depression with anxiety   Essential hypertension   Anemia   Type 2 diabetes mellitus with neurological complications (HCC)   Primary adenocarcinoma of ascending colon (HCC)   Thrombocytopenia (HCC)   Shock (HCC)   Liver mass   Hyponatremia   Abdominal pain  Esophageal varices with bleeding (HCC) Acute blood loss anemia S/p EGD on 03/25/23 with banding x 4  Did have recurrent bleeding on 10/11 but CTA showed only wall thickening and inflammatory change in duodenum, no extravasation.    Completed octreotide.  GI have signed off.  Hemoglobin stable. - Continue iron, PPI, nadolol    Streptococcal bacteremia Found to have strep anginosis 2/3 bottles from 10/11 TTE unremarkable.  ID consulted and recommended 2 weeks therapy, to finish with cefadroxil 1g BID EOT 10/29   Alcohol withdrawal delirium (HCC) Acute metabolic encephalopathy Hepatic encephalopathy Patient developed  confusion/disorientation after transfer to ICU that was likely a combination of alcohol withdrawal delirium and hepatic encephalopathy.   Weaned off Precedex.  Completed phenobarbital taper. His encephalopathy is improved, ammonia is down to normal, but he has some residual memory loss and cognitive impairment, which may be his baseline hepatic encephalopathy or may be cognitive impairment from alcohol and may not resolve.  However during my evaluation, patient is fully alert and oriented today.  PT OT consulted, recommendation for SNF.  Chronic kidney disease stage IV AKI ruled out Baseline SCr appears to be ~ 1.3 when he is off diuretics, but >2 when he is on diuretics (e.g. last Feb when seen by his hepatologist in [place]). Cr here 1.7 on admission, trending up and down between 1.7 and 2.5 I suspect his baseline with diuretics will end up being in the 2+ range, if so, he is at his baseline.   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  Steroids held due to bacteremia, variceal bleeding. MELDNa was 17 at last hepatology appointment, but he resumed drinking some time after that.  He has poor insight into his advancing liver disease and shared with Dr. Maryfrances Bunnell that he knows he should stop "although I'd like to be the kind of person who is able to drink at Thanksgiving or Christmas".   Not a transplant candidate with active alcohol and cancer. - Continue rifaximin - Continue nadolol - Low sodium diet - Alcohol cessation - Palliative care consulted.   Ascites/portal hypertension/abdominal pain Cultures no growth so far.  SBP ruled out with para x2.  Repeat liver Doppler was  done 04/06/2023 which confirmed portal hypertension.  Mild ascites.  Only complains of abdominal pain but that is chronic for him.  On examination, abdomen is slightly distended, soft and essentially nontender and not tense at all.  No suspicion of  SBP. - Resumed Lasix and spironolactone  - Resumed low dose midodrine with diuretics - Close monitoring crt Cytology on one ascites tap this admission was negative for malignancy (given hx colon CA).  SBP ruled out.  PVT ruled out. - Follow AFP - Avoid excessive opiates   Hyponatremia Na stable    Liver mass Noted to have mass on imaging with [facility] [facility] last spring.  MRI abdomen in April suggested this was a hemangioma, no follow up recommended by patient's Oncologist.     Here, he has had two CTAs of the abdomen and two ultrasounds that were not able to visualize the lesion.   - GI have recommended nonurgent triple phase CT scan of the abdomen if renal function allows and if goals of care are consistent    Pancytopenia Thrombocytopenia Due to alcohol, counts stable today. - Alcohol cessation.   Shock (HCC) Hemorrhagic and possibly septic.  Superimposed on vasoplegia of chronic liver disease.   Echo unremarkable.  Now resolved.      Primary adenocarcinoma of ascending colon (HCC) S/p hemicolectomy 2023.  pT3N0 stage II-III, no adjuvant chemo, in surveillance with [facility], CT abd/pelvis.  - Will need local Heme-Onc follow up after discharge   Type 2 diabetes mellitus with neurological complications (HCC) A1c 5%, well controlled off meds.  No SS corrections needed    Anemia Baseline hemoglobin of 9-11.    Hgb stable - Transfusion threshold 7 g/dL   Essential hypertension BP low normal - Monitor BP on diuretics - Continue nadolol - Resume midodrine   Depression with anxiety - Continue Zoloft  DVT prophylaxis: SCDs Start: 03/23/23 2143   Code Status: Full Code  Family Communication:  None present at bedside.  Plan of care discussed with patient in length and he/she verbalized understanding and agreed with it.  Status is: Inpatient Remains inpatient appropriate because: Patient needs to go to SNF, he is declining SNF at the moment.  He thinks he would like to be  discharged home tomorrow.   Estimated body mass index is 22.81 kg/m as calculated from the following:   Height as of this encounter: 6\' 4"  (1.93 m).   Weight as of this encounter: 85 kg.    Nutritional Assessment: Body mass index is 22.81 kg/m.Marland Kitchen Seen by dietician.  I agree with the assessment and plan as outlined below: Nutrition Status:        . Skin Assessment: I have examined the patient's skin and I agree with the wound assessment as performed by the wound care RN as outlined below:    Consultants:  None at the moment.  Procedures:  As above  Antimicrobials:  Anti-infectives (From admission, onward)    Start     Dose/Rate Route Frequency Ordered Stop   04/06/23 1045  cefadroxil (DURICEF) capsule 1,000 mg        1,000 mg Oral 2 times daily 04/06/23 0946 04/14/23 0959   03/27/23 0900  cefTRIAXone (ROCEPHIN) 2 g in sodium chloride 0.9 % 100 mL IVPB  Status:  Discontinued        2 g 200 mL/hr over 30 Minutes Intravenous Daily 03/27/23 0836 04/06/23 0946   03/24/23 2200  rifaximin (XIFAXAN) tablet 550 mg  550 mg Oral 2 times daily 03/24/23 1531           Subjective: Patient seen and examined.  Complains of chronic abdominal pain.  No other complaint.  Objective: Vitals:   04/06/23 1938 04/07/23 0502 04/07/23 0529 04/07/23 0850  BP: 115/70 (!) 98/56  104/63  Pulse: (!) 59 (!) 58  69  Resp: 18 17    Temp: 98.4 F (36.9 C) 98.6 F (37 C)    TempSrc: Oral Oral    SpO2: 100% 100%  100%  Weight:   85 kg   Height:        Intake/Output Summary (Last 24 hours) at 04/07/2023 1046 Last data filed at 04/07/2023 0916 Gross per 24 hour  Intake 360 ml  Output 181 ml  Net 179 ml   Filed Weights   04/05/23 0546 04/06/23 0601 04/07/23 0529  Weight: 84.9 kg 84 kg 85 kg    Examination:  General exam: Appears calm and comfortable, appears icteric Respiratory system: Clear to auscultation. Respiratory effort normal. Cardiovascular system: S1 & S2 heard,  RRR. No JVD, murmurs, rubs, gallops or clicks. No pedal edema. Gastrointestinal system: Abdomen is slightly distended, soft and nontender. No organomegaly or masses felt. Normal bowel sounds heard. Central nervous system: Alert and oriented. No focal neurological deficits. Extremities: Symmetric 5 x 5 power. Skin: No rashes, lesions or ulcers Psychiatry: Judgement and insight appear normal. Mood & affect appropriate.    Data Reviewed: I have personally reviewed following labs and imaging studies  CBC: Recent Labs  Lab 04/01/23 0453 04/02/23 0506 04/03/23 0515 04/05/23 0443 04/06/23 0550 04/07/23 0522  WBC 2.3* 1.8* 1.7* 2.4* 3.2* 4.3  NEUTROABS 1.5* 1.1* 1.1*  --   --   --   HGB 7.3* 7.1* 7.3* 7.4* 8.0* 8.0*  HCT 22.6* 22.4* 22.5* 23.7* 25.0* 24.6*  MCV 105.6* 105.7* 104.2* 106.8* 105.5* 104.2*  PLT 72* 64* 61* 55* 61* 71*   Basic Metabolic Panel: Recent Labs  Lab 04/01/23 0453 04/02/23 0506 04/03/23 0515 04/05/23 0443 04/06/23 0550 04/07/23 0522  NA 137 135 134* 131* 132* 129*  K 3.7 3.9 4.4 4.6 4.7 4.8  CL 110 109 109 108 107 105  CO2 18* 20* 17* 17* 16* 17*  GLUCOSE 149* 130* 127* 157* 136* 122*  BUN 38* 29* 29* 34* 32* 37*  CREATININE 2.14* 1.88* 1.69* 2.16* 2.27* 2.24*  CALCIUM 7.7* 7.7* 8.0* 8.2* 8.5* 8.4*  MG 2.1 2.1 2.1  --   --   --   PHOS 2.5 2.3* 2.4*  --   --   --    GFR: Estimated Creatinine Clearance: 44.8 mL/min (A) (by C-G formula based on SCr of 2.24 mg/dL (H)). Liver Function Tests: Recent Labs  Lab 04/02/23 0506 04/03/23 0515 04/05/23 0443 04/06/23 0550 04/07/23 0522  AST 61* 65* 55* 70* 71*  ALT 22 25 25 26 25   ALKPHOS 95 98 101 107 117  BILITOT 10.2* 10.9* 9.7* 9.6* 9.4*  PROT 5.2* 5.1* 5.5* 6.0* 5.9*  ALBUMIN 2.5* 2.4* 2.4* 2.5* 2.5*   No results for input(s): "LIPASE", "AMYLASE" in the last 168 hours. Recent Labs  Lab 04/02/23 0506 04/05/23 0443 04/07/23 0522  AMMONIA 42* 27 43*   Coagulation Profile: Recent Labs  Lab  04/05/23 0443 04/06/23 0550 04/07/23 0522  INR 2.0* 1.9* 1.8*   Cardiac Enzymes: No results for input(s): "CKTOTAL", "CKMB", "CKMBINDEX", "TROPONINI" in the last 168 hours. BNP (last 3 results) No results for input(s): "PROBNP" in the last  8760 hours. HbA1C: No results for input(s): "HGBA1C" in the last 72 hours. CBG: No results for input(s): "GLUCAP" in the last 168 hours. Lipid Profile: No results for input(s): "CHOL", "HDL", "LDLCALC", "TRIG", "CHOLHDL", "LDLDIRECT" in the last 72 hours. Thyroid Function Tests: No results for input(s): "TSH", "T4TOTAL", "FREET4", "T3FREE", "THYROIDAB" in the last 72 hours. Anemia Panel: Recent Labs    04/06/23 0550  VITAMINB12 2,405*   Sepsis Labs: No results for input(s): "PROCALCITON", "LATICACIDVEN" in the last 168 hours.  Recent Results (from the past 240 hour(s))  Peritoneal fluid culture w Gram Stain     Status: None   Collection Time: 03/30/23 10:39 AM   Specimen: Peritoneal Washings; Peritoneal Fluid  Result Value Ref Range Status   Specimen Description   Final    PERITONEAL Performed at [facility] [address], [place], [place] [address]    Special Requests   Final    NONE Performed at [facility], [address], [place], [place] [address]    Gram Stain   Final    FEW WBC PRESENT, PREDOMINANTLY MONONUCLEAR NO ORGANISMS SEEN    Culture   Final    NO GROWTH 3 DAYS Performed at [facility], [address] [place], [place] [address]    Report Status 04/02/2023 FINAL  Final  Culture, blood (Routine X 2) w Reflex to ID Panel     Status: None   Collection Time: 03/31/23 10:16 AM   Specimen: BLOOD RIGHT ARM  Result Value Ref Range Status   Specimen Description   Final    BLOOD RIGHT ARM Performed at [facility], [address], [place], [place] [address]    Special Requests   Final    BOTTLES DRAWN AEROBIC AND ANAEROBIC Blood Culture adequate volume Performed at  [facility], [address] [place], [place] [address]    Culture   Final    NO GROWTH 5 DAYS Performed at [facility], [address] [place], [place] [address]    Report Status 04/05/2023 FINAL  Final  Culture, blood (Routine X 2) w Reflex to ID Panel     Status: None   Collection Time: 03/31/23 10:16 AM   Specimen: BLOOD LEFT HAND  Result Value Ref Range Status   Specimen Description   Final    BLOOD LEFT HAND Performed at [facility], [address], [place], [place] [address]    Special Requests   Final    BOTTLES DRAWN AEROBIC AND ANAEROBIC Blood Culture adequate volume Performed at [facility], [address], [place], [place] [address]    Culture   Final    NO GROWTH 5 DAYS Performed at [facility], [address], [place], [place] [address]    Report Status 04/05/2023 FINAL  Final  Gram stain     Status: None   Collection Time: 03/31/23  5:00 PM   Specimen: PATH Cytology Peritoneal fluid  Result Value Ref Range Status   Specimen Description   Final    PERITONEAL Performed at [facility], [address] [place], [place] [address]    Special Requests   Final    NONE Performed at [facility], [address] [place], [place] [address]    Gram Stain   Final    WBC PRESENT, PREDOMINANTLY MONONUCLEAR NO ORGANISMS SEEN CYTOSPIN SMEAR Performed at [facility], [address], [place], [place] [address]    Report Status 03/31/2023 FINAL  Final     Radiology Studies: US ABDOMEN LIMITED WITH LIVER DOPPLER  Result Date: 04/06/2023 CLINICAL DATA:  Cirrhosis and epigastric pain. EXAM: DUPLEX ULTRASOUND OF LIVER TECHNIQUE: Color and duplex Doppler ultrasound was performed to evaluate the hepatic in-flow and out-flow vessels. COMPARISON:  None Available. FINDINGS: Liver: Cirrhotic morphology of the liver without visualized focal mass lesion or biliary ductal  dilatation. Main Portal Vein size: 1.7 cm Portal Vein Velocities Main Prox:  17 cm/sec Main Mid: 19 cm/sec Main Dist:  11 cm/sec Right: 17 cm/sec Left: 18 cm/sec Hepatic Vein Velocities Right:  55 cm/sec Middle:  42 cm/sec Left:  60 cm/sec IVC: Present and patent with normal respiratory phasicity. Hepatic Artery Velocity:  255 cm/sec Splenic Vein Velocity:  54 cm/sec Spleen: 22 cm x 8.4 cm x 21 cm with a total volume of 1,990 cm^3 (411 cm^3 is upper limit normal) Portal Vein Occlusion/Thrombus: No Splenic Vein Occlusion/Thrombus: No Ascites: Ascites visualized. Varices: None Reduced portal vein velocities are consistent with underlying portal hypertension. Portal venous waveforms and color Doppler demonstrate to and fro flow with predominantly reversed flow. No visualized portal vein thrombus. Hepatic vein waveforms are unremarkable. Hepatic artery waveform is unremarkable. IMPRESSION: 1. Evidence of portal hypertension by duplex ultrasound. 2. To and fro portal venous flow with predominantly reversed flow. No evidence of portal vein thrombus. 3. Significant splenomegaly. 4. Ascites. Electronically Signed   By: Irish Lack M.D.   On: 04/06/2023 11:55   US Paracentesis  Result Date: 04/05/2023 INDICATION: Left lower quadrant paracentesis for ascites EXAM: ULTRASOUND GUIDED left lower quadrant PARACENTESIS MEDICATIONS: None. COMPLICATIONS: None immediate. PROCEDURE: Informed written consent was obtained from the patient after a discussion of the risks, benefits and alternatives to treatment. A timeout was performed prior to the initiation of the procedure. Initial ultrasound scanning demonstrates a large amount of ascites within the right lower abdominal quadrant. The right lower abdomen was prepped and draped in the usual sterile fashion. 1% lidocaine was used for local anesthesia. Following this, a 19 gauge, 7-cm, Yueh catheter was introduced. An ultrasound image was saved for documentation purposes. The  paracentesis was performed. The catheter was removed and a dressing was applied. The patient tolerated the procedure well without immediate post procedural complication. FINDINGS: A total of approximately 2.5 L of clear yellow fluid was removed. Samples were not sent to laboratory IMPRESSION: Successful ultrasound-guided paracentesis yielding 2.5 liters of peritoneal fluid. Performed by Ardith Dark NP-C Electronically Signed   By: Gilmer Mor D.O.   On: 04/05/2023 12:56    Scheduled Meds:  cefadroxil  1,000 mg Oral BID   Chlorhexidine Gluconate Cloth  6 each Topical Q2200   ferrous sulfate  325 mg Oral QODAY   folic acid  1 mg Oral Daily   furosemide  20 mg Oral Daily   gabapentin  100 mg Oral TID   midodrine  2.5 mg Oral TID WC   nadolol  20 mg Oral Daily   nicotine  7 mg Transdermal Daily   pantoprazole  40 mg Oral BID   rifaximin  550 mg Oral BID   sertraline  50 mg Oral QHS   sodium chloride flush  10 mL Intravenous Q12H   sodium chloride flush  3 mL Intravenous Q12H   spironolactone  12.5 mg Oral Daily   thiamine  100 mg Oral Daily   Continuous Infusions:   LOS: 15 days   Leea Rambeau  Zyana Amaro, MD [facility]  04/07/2023, 10:46 AM   *Please note that this is a verbal dictation therefore any spelling or grammatical errors are due to the "[employer]agon [employer] One" system interpretation.  Please page via Amion and do not message via secure chat for urgent patient care matters. Secure chat can be used for non urgent patient care matters.  How to contact the [facility] Attending or Consulting provider 7A - 7P or covering provider during after hours 7P -7A, for this patient?  Check the care team in [facility] and look for a) attending/consulting [facility] provider listed and b) the [facility] team listed. Page or secure chat 7A-7P. Log into [email or web address] and use [place]'s universal password to access. If you do not have the password, please contact the hospital operator. Locate the [facility] provider you are  looking for under [facility] and page to a number that you can be directly reached. If you still have difficulty reaching the provider, please page the [facility] (Director on Call) for the Hospitalists listed on amion for assistance.

## 2023-04-07 NOTE — Consult Note (Signed)
Palliative Care Consult Note                                  Date: 04/07/2023   Patient Name: Martin Anthony  DOB: 11/17/1965  MRN: [id number]  Age / Sex: 57 y.o., male  PCP: Nelwyn Salisbury, MD Referring Physician: Hughie Closs, MD  Reason for Consultation: Establishing goals of care  HPI/Patient Profile: 57 y.o. male  with past medical history of advanced cirrhosis, colon CA s/p hemicolectomy in 2023 and ostomy in place, persistent alcohol dependence, portal HTN, hx varices who presented with blood in ileostomy. He was admitted on 03/23/2023 with a esophageal varices with bleeding, acute blood loss anemia, streptococcal bacteremia, alcohol withdrawal delirium, hepatic encephalopathy, chronic kidney disease stage IV, alcoholic cirrhosis (end-stage) with ascites, alcoholic hepatitis, portal hypertension, and others.   Palliative medicine was consulted for GOC conversations.  Past Medical History:  Diagnosis Date   Anxiety    Ascites due to alcoholic cirrhosis (HCC)    Cirrhosis with alcoholism (HCC)    Depression    Diabetes mellitus without complication (HCC)    GERD (gastroesophageal reflux disease)    Gout    Hyperlipidemia    Hypertension    Primary adenocarcinoma of ascending colon (HCC)    Substance abuse (HCC)    alcoholism    Type 2 diabetes mellitus with diabetic neuropathy (HCC)     Subjective:   This NP Wynne Dust reviewed medical records, received report from team, assessed the patient and then meet at the patient's bedside to discuss diagnosis, prognosis, GOC, EOL wishes disposition and options.  I met with the patient at the bedside, no family was present.   We meet to discuss diagnosis prognosis, GOC, EOL wishes, disposition and options. Concept of Palliative Care was introduced as specialized medical care for people and their families living with serious illness.  If focuses on providing relief from the symptoms and  stress of a serious illness.  The goal is to improve quality of life for both the patient and the family. Values and goals of care important to patient and family were attempted to be elicited.  Created space and opportunity for patient  and family to explore thoughts and feelings regarding current medical situation   Natural trajectory and current clinical status were discussed. Questions and concerns addressed. Patient  encouraged to call with questions or concerns.    Patient/Family Understanding of Illness: He understands that his kidneys are bad because of his liver.  He has serious tumors in his colon that were removed and resulted in an ileostomy bag.  He knows he has terminal liver disease.  He was told not to drink and if he drank anyway then he would likely be dead within 6 months.  He knows he has confusion, which we named "encephalopathy".  He also knows he had varices in his belly that bled.  We spent a substantial time to time further clarifying details about his clinical status and prognosis.  Life Review: He has a son and a daughter.  His elderly parents live near him.  Patient Values: Quality of life, family  Goals: To try to live, to try to quit drinking, promote quality of life  Today's Discussion: In addition to discussions described above we had substantial discussion of various topics.  He notes overtly that he is at peace with that at some point.  He states that he  has to figure out what to do with his stuff when it is time for him to die.  He also states if he knew that he would would live this long he would have taken better care of his body.  We discussed his family history of alcohol and substance abuse noting that his grandfather and his uncle both died at the age of 47.    We discussed the propensity for substance abuse to run in families and that substance abuse including alcoholism as a disease, but it does not release him from responsibility to try to combat this.   He notes that he intends to stop drinking, but we excepted this is very hard for people with alcoholism to do.  He equates it to "I intend to win the lottery 2, but that is also not very easy."  We also talked about the childhood influence on his alcoholism.  He states that he saw his family constantly drinking get-togethers and figured that she is what people do.  This seems to be somewhat of a learned behavior that has been propagated by genetic predisposition.  We had a very frank conversation about his prognosis given his MELD score.  I provided blood statistics about survivability.  We discussed that he can improve the chances of living longer if he takes care of his self, abstains from alcohol, stays active, and eat well.  I also stressed the need to follow-up with his outpatient providers for continued liver care and cancer surveillance.  He asked very insightful and open questions about what to do as his disease progresses.  We discussed that at some point we will facing end-of-life situation.  At that time we often discussed comfort care or hospice care with a focus on comfort, peace, dignity if we are at a point that we cannot "fix the problems anymore".  He seems to accept this.  We spent substantial time talking about goals of care and advance care planning.  We talked about DNR and how it can be appropriate in patients with multiple chronic illnesses that are unlikely to recover from a resuscitation event or at least recover to a good quality of life.  We also discussed about making decisions and having discussions with family around desire such as feeding tube, prolonged intubation, etc.  He excepted these are conversations that should be had, although he notes they are difficult to have.  I provided "difficult choices for loving people" booklet to aid in these conversations.  Overall I feel the visit went well and he has a good understanding of how serious his illnesses.  I hope that he will  continue to have these conversations with the family and even potentially complete advance care planning documents at some point.  We note that it appears he will be discharged tomorrow but if he gets readmitted then palliative medicine will likely be reconsulted to come speak with him further.  At some point when he is at the end of life we will have more frank discussions about comfort care.  In the meantime we will continue to promote having open discussions with family given the severity of his illness.  I provided emotional and general support through therapeutic listening, empathy, sharing of stories, and other techniques. I answered all questions and addressed all concerns to the best of my ability.  Review of Systems  Constitutional:  Positive for fatigue.  Respiratory:  Negative for cough and shortness of breath.   Cardiovascular:  Negative for chest  pain.  Gastrointestinal:  Positive for abdominal pain (chronic). Negative for nausea and vomiting.  Neurological:  Positive for weakness.    Objective:   Primary Diagnoses: Present on Admission:  Esophageal varices with bleeding (HCC)  Primary adenocarcinoma of ascending colon (HCC)  Alcoholic cirrhosis of liver with ascites (HCC)  Acute renal failure superimposed on stage 2 chronic kidney disease (HCC)  Type 2 diabetes mellitus with neurological complications (HCC)  Essential hypertension  Depression with anxiety  Anemia   Physical Exam Vitals and nursing note reviewed.  Constitutional:      General: He is not in acute distress.    Appearance: He is ill-appearing.  Cardiovascular:     Rate and Rhythm: Normal rate.     Heart sounds: No murmur heard. Pulmonary:     Effort: Pulmonary effort is normal. No respiratory distress.     Breath sounds: No wheezing or rhonchi.  Abdominal:     General: There is distension (mild).     Palpations: Abdomen is soft.     Tenderness: There is abdominal tenderness (with palpation, mild).   Skin:    General: Skin is warm and dry.  Neurological:     Mental Status: He is alert and oriented to person, place, and time.  Psychiatric:        Mood and Affect: Mood normal.        Behavior: Behavior normal.     Vital Signs:  BP (!) 110/59 (BP Location: Right Arm)   Pulse (!) 58   Temp 98.6 F (37 C) (Oral)   Resp 16   Ht 6\' 4"  (1.93 m)   Wt 85 kg   SpO2 99%   BMI 22.81 kg/m   Palliative Assessment/Data: 50-60%    Advanced Care Planning:   Existing Vynca/ACP Documentation: None  Primary Decision Maker: PATIENT  Code Status/Advance Care Planning: Full code  A discussion was had today regarding advanced directives. Concepts specific to code status, artifical feeding and hydration, continued IV antibiotics and rehospitalization was had.  The difference between a aggressive medical intervention path and a palliative comfort care path for this patient at this time was had.   Decisions/Changes to ACP: None today  Assessment & Plan:   Impression: 57 year old male with acute presentation of chronic comorbidities as described above.  In short, he has end-stage liver disease and presented with complication of a variceal bleed and required banding x 4.  History of colon cancer stage II-III locally advanced status post surgical hemicolectomy not requiring adjuvant chemotherapy.  He is currently on a plan for monitoring with [facility] and CT.  He is not a transplant candidate given history of cancer and ongoing alcohol use.  He has been having encouraged to abstain from alcohol.  After frank discussion today I think he fully understands how serious his health is.  We encouraged ongoing discussions with family on advance care planning to complete appropriate documents.  Per the patient, anticipate discharge tomorrow.  Overall long-term prognosis is poor.  SUMMARY OF RECOMMENDATIONS   Remain full code and full scope Continue discussions around advance care planning and goals of  care Insist on alcohol abstinence Encourage discussions with family about wishes Outpatient follow-up as per recommendations Palliative medicine will follow-up tomorrow if the patient is still admitted  Symptom Management:  Per primary team PMT is available to assist as needed  Prognosis:  Unable to determine  Discharge Planning:  To Be Determined   Discussed with: Patient, medical team, nursing team  Thank you for allowing [name] to participate in the care of EDRIN SAMPLEY PMT will continue to support holistically.  Time Total: 75 min  Detailed review of medical records (labs, imaging, vital signs), medically appropriate exam, discussed with treatment team, counseling and education to patient, family, & staff, documenting clinical information, medication management, coordination of care  Signed by: Wynne Dust, NP Palliative Medicine Team  Team Phone # [phone] (Nights/Weekends)  04/07/2023, 2:40 PM

## 2023-04-07 NOTE — Progress Notes (Signed)
PT Cancellation Note  Patient Details Name: HUXTON WAND MRN: [id number] DOB: 10-Jan-1966   Cancelled Treatment:    Reason Eval/Treat Not Completed: Pt arrived earlier in day and palliative medicine team in room with pt. PT returned later in the evening and patient declined, pt stated just got dinner and that he is planning on going home tomorrow.   Johnny Bridge, PT Acute Rehab  Jacqualyn Posey 04/07/2023, 5:53 PM

## 2023-04-08 DIAGNOSIS — K2991 Gastroduodenitis, unspecified, with bleeding: Secondary | ICD-10-CM | POA: Diagnosis not present

## 2023-04-08 DIAGNOSIS — I8511 Secondary esophageal varices with bleeding: Secondary | ICD-10-CM | POA: Diagnosis not present

## 2023-04-08 DIAGNOSIS — Z515 Encounter for palliative care: Secondary | ICD-10-CM | POA: Diagnosis not present

## 2023-04-08 DIAGNOSIS — Z7189 Other specified counseling: Secondary | ICD-10-CM | POA: Diagnosis not present

## 2023-04-08 MED ORDER — MIDODRINE HCL 2.5 MG PO TABS: 2.5000 mg | ORAL_TABLET | Freq: Three times a day (TID) | ORAL | 0 refills | Status: DC

## 2023-04-08 MED ORDER — CEFADROXIL 500 MG PO CAPS: 1000.0000 mg | ORAL_CAPSULE | Freq: Two times a day (BID) | ORAL | 0 refills | Status: AC

## 2023-04-08 MED ORDER — NICOTINE 14 MG/24HR TD PT24: 14.0000 mg | MEDICATED_PATCH | Freq: Every day | TRANSDERMAL | 0 refills | Status: DC

## 2023-04-08 MED ORDER — GABAPENTIN 100 MG PO CAPS: 100.0000 mg | ORAL_CAPSULE | Freq: Three times a day (TID) | ORAL | 0 refills | Status: DC

## 2023-04-08 MED ORDER — SPIRONOLACTONE 25 MG PO TABS: 12.5000 mg | ORAL_TABLET | Freq: Every day | ORAL | 0 refills | Status: DC

## 2023-04-08 MED ORDER — FUROSEMIDE 20 MG PO TABS: 20.0000 mg | ORAL_TABLET | Freq: Every day | ORAL | 0 refills | Status: DC

## 2023-04-08 NOTE — Progress Notes (Addendum)
AVS reviewed w/ patient who verbalized an understanding- Flu vaccine refused at this time. Pt has no other questions. PIV removed as documented. Pt to wait in room to await ride home.

## 2023-04-08 NOTE — TOC Transition Note (Signed)
Transition of Care [place]) - CM/SW Discharge Note   Patient Details  Name: Martin Anthony MRN: [id number] Date of Birth: 1965-07-17  [facility] CM/SW Contact:  Lanier Clam, RN Phone Number: 04/08/2023, 11:41 AM   Clinical Narrative: Patient declined ST SNF;Has insurance that no HHC agency can accept-all No responses-[facility].MD notified. No further CM needs.      Final next level of care: Home/Self Care Barriers to Discharge: No Barriers Identified   Patient Goals and CMS Choice CMS Medicare.gov Compare Post Acute Care list provided to:: Patient Choice offered to / list presented to : Patient  Discharge Placement                         Discharge Plan and Services Additional resources added to the After Visit Summary for     Discharge Planning Services: CM Consult Post Acute Care Choice: Resumption of Svcs/PTA Provider                    HH Arranged: Refused SNF HH Agency: Other - See comment (No HHC agency to accept-response NO-[facility], [facility])        Social Determinants of Health (SDOH) Interventions SDOH Screenings   Food Insecurity: Food Insecurity Present (03/23/2023)  Housing: Low Risk  (03/23/2023)  Transportation Needs: No Transportation Needs (03/23/2023)  Recent Concern: Transportation Needs - Unmet Transportation Needs (03/23/2023)  Utilities: Not At Risk (03/23/2023)  Alcohol Screen: High Risk (12/05/2021)  Depression (PHQ2-9): High Risk (02/02/2023)  Financial Resource Strain: Low Risk  (10/06/2022)   Received from [facility], [facility], [facility]  Physical Activity: Unknown (12/05/2021)  Social Connections: Unknown (08/11/2022)   Received from [facility], [facility]  Stress: Stress Concern Present (12/05/2021)  Tobacco Use: Medium Risk (03/25/2023)  Health Literacy: Adequate  Health Literacy (11/26/2021)   Received from [facility] [place], [facility]  Recent Concern: Health Literacy - Inadequate Health Literacy (11/01/2021)   Received from [facility] [place]     Readmission Risk Interventions     No data to display

## 2023-04-08 NOTE — Progress Notes (Signed)
Daily Progress Note   Patient Name: Martin Anthony       Date: 04/08/2023 DOB: May 02, 1966  Age: 57 y.o. MRN#: [id number] Attending Physician: Hughie Closs, MD Primary Care Physician: Nelwyn Salisbury, MD Admit Date: 03/23/2023 Length of Stay: 16 days  Reason for Consultation/Follow-up: Establishing goals of care  HPI/Patient Profile:   57 y.o. male  with past medical history of advanced cirrhosis, colon CA s/p hemicolectomy in 2023 and ostomy in place, persistent alcohol dependence, portal HTN, hx varices who presented with blood in ileostomy. He was admitted on 03/23/2023 with a esophageal varices with bleeding, acute blood loss anemia, streptococcal bacteremia, alcohol withdrawal delirium, hepatic encephalopathy, chronic kidney disease stage IV, alcoholic cirrhosis (end-stage) with ascites, alcoholic hepatitis, portal hypertension, and others.    Palliative medicine was consulted for GOC conversations.  Subjective:   Subjective: Chart Reviewed. Updates received. Patient Assessed. Created space and opportunity for patient  and family to explore thoughts and feelings regarding current medical situation.  Today's Discussion: Today saw the patient at bedside, he states that he has been discharged later today.  His friend Orlie Pollen is coming to pick him up.  He denies nausea or vomiting.  He notes his abdominal pain is very minimal as he is noticeably less bloated today.  He attributes this to the diuretics.  He is ambulating okay and feels safe to go home.  He had more questions about his pathophysiology related to liver disease which I took time to answer.  We also discussed discharge plans and he states he understands he should follow-up with his PCP in 1 to 2 weeks.  He does have a liver doctor in [place] and has an appointment upcoming.  He also sees an oncologist in [place] but this is generally only at follow-up intervals of once a year for surveillance.  He plans to keep his physicians in  [place] as he is there substantially.  We again discussed encouragement on alcohol cessation to promote most amount of time.  I provided a copy of a blank MOST form to aid him in discussions with his family about his wishes.  He expressed appreciation.  I wish him the best and provided emotional and general support through therapeutic listening, empathy, sharing of stories, and other techniques. I answered all questions and addressed all concerns to the best of my ability.  Review of Systems  Constitutional:  Negative for fatigue.  Respiratory:  Negative for shortness of breath.   Cardiovascular:  Negative for chest pain.  Gastrointestinal:  Positive for abdominal distention (Much improved) and abdominal pain (Minimal, significantly improved). Negative for nausea and vomiting.    Objective:   Vital Signs:  BP 119/63 (BP Location: Right Arm)   Pulse (!) 59   Temp 98.6 F (37 C) (Oral)   Resp 17   Ht 6\' 4"  (1.93 m)   Wt 85 kg   SpO2 99%   BMI 22.81 kg/m   Physical Exam: Physical Exam Vitals and nursing note reviewed.  Constitutional:      General: He is not in acute distress.    Appearance: He is ill-appearing. He is not toxic-appearing.  HENT:     Head: Normocephalic and atraumatic.  Cardiovascular:     Rate and Rhythm: Normal rate.  Pulmonary:     Effort: Pulmonary effort is normal. No respiratory distress.  Abdominal:     General: There is distension (Mild/minimal).     Palpations: Abdomen is soft.  Skin:    General: Skin  is warm and dry.     Coloration: Skin is jaundiced.  Neurological:     General: No focal deficit present.     Mental Status: He is alert and oriented to person, place, and time.  Psychiatric:        Mood and Affect: Mood normal.        Behavior: Behavior normal.     Palliative Assessment/Data: 70%    Existing Vynca/ACP Documentation: None  Assessment & Plan:   Impression: Present on Admission:  Esophageal varices with bleeding  (HCC)  Primary adenocarcinoma of ascending colon (HCC)  Alcoholic cirrhosis of liver with ascites (HCC)  Acute renal failure superimposed on stage 2 chronic kidney disease (HCC)  Type 2 diabetes mellitus with neurological complications (HCC)  Essential hypertension  Depression with anxiety  Anemia  57 year old male with acute presentation of chronic comorbidities as described above. In short, he has end-stage liver disease and presented with complication of a variceal bleed and required banding x 4. History of colon cancer stage II-III locally advanced status post surgical hemicolectomy not requiring adjuvant chemotherapy. He is currently on a plan for monitoring with [facility] and CT. He is not a transplant candidate given history of cancer and ongoing alcohol use. He has been having encouraged to abstain from alcohol. After frank discussion today I think he fully understands how serious his health is. We encouraged ongoing discussions with family on advance care planning to complete appropriate documents. Per the patient, anticipate discharge tomorrow. Overall long-term prognosis is poor.   SUMMARY OF RECOMMENDATIONS   Remain full code and full scope Insist on alcohol abstinence Encourage discussions with family about wishes Encouraged outpatient follow-up as per recommendations Anticipate discharge today  Symptom Management:  Per primary team PMT is available to assist as needed  Code Status: Full code  Prognosis: Unable to determine  Discharge Planning:  Home  Discussed with: Patient, medical team, nursing team  Thank you for allowing [name] to participate in the care of Wilburn Cornelia PMT will continue to support holistically.  Time Total: 40 min  Detailed review of medical records (labs, imaging, vital signs), medically appropriate exam, discussed with treatment team, counseling and education to patient, family, & staff, documenting clinical information, medication management,  coordination of care  Wynne Dust, NP Palliative Medicine Team  Team Phone # [phone] (Nights/Weekends)  02/11/2021, 8:17 AM

## 2023-04-08 NOTE — Plan of Care (Signed)
  Problem: Pain Managment: Goal: General experience of comfort will improve Outcome: Progressing   Problem: Education: Goal: Ability to identify signs and symptoms of gastrointestinal bleeding will improve Outcome: Progressing   Problem: Safety: Goal: Non-violent Restraint(s) Outcome: Progressing

## 2023-04-08 NOTE — Discharge Summary (Addendum)
Physician Discharge Summary  Martin Anthony ION:[id number] DOB: 21-Mar-1966 DOA: 03/23/2023  PCP: Nelwyn Salisbury, MD  Admit date: 03/23/2023 Discharge date: 04/08/2023 30 Day Unplanned Readmission Risk Score    Flowsheet Row ED to Hosp-Admission (Current) from 03/23/2023 in [facility] 4TH FLOOR PROGRESSIVE CARE AND UROLOGY  30 Day Unplanned Readmission Risk Score (%) 37.99 Filed at 04/08/2023 0801       This score is the patient's risk of an unplanned readmission within 30 days of being discharged (0 -100%). The score is based on dignosis, age, lab data, medications, orders, and past utilization.   Low:  0-14.9   Medium: 15-21.9   High: 22-29.9   Extreme: 30 and above          Admitted From: Home Disposition: Home  Recommendations for Outpatient Follow-up:  Follow up with PCP in 1-2 weeks Please obtain BMP/CBC in one week Follow-up with hepatology/gastroenterology in 2 to 4 weeks. Follow-up with hematology/oncology in 2 to 4 weeks for follow-up of colon cancer. Please follow up with your PCP on the following pending results: Unresulted Labs (From admission, onward)    None         Home Health: None, per TOC "no HHC that any agency will accept d/t insurance, & patient aware he had the option of going to a ST SNF & he declined"   Equipment/Devices: None  Discharge Condition: Stable CODE STATUS: Full code Diet recommendation: Low-sodium  Subjective: Seen and examined.  Feeling well.  No complaints.  Agrees with discharging home today and he is happy about that.  Brief/Interim Summary: Martin Anthony is a 57 y.o. M with advanced cirrhosis, colon CA s/p hemicolectomy in 2023 and ostomy in place, persistent alcohol dependence, portal HTN, hx varices who presented with blood in ileostomy.  Patient admitted and underwent EGD with banding of varices.  Developed recurrent hemorrhage with shock requiring vasopressors and transfer to ICU.    While in the ICU, delirium developed requiring  Precedex.  This was transitioned to phenobarbital and hemorrhage stabilized.    Subsequent hospital course complicated by persistent encephalopathy, recurrent ascites, streptococcal bacteremia.  Details below.   Esophageal varices with bleeding (HCC) Acute blood loss anemia S/p EGD on 03/25/23 with banding x 4  Did have recurrent bleeding on 10/11 but CTA showed only wall thickening and inflammatory change in duodenum, no extravasation.    Completed octreotide.  GI have signed off.  Hemoglobin stable. - Continue iron, PPI, nadolol    Streptococcal bacteremia Found to have strep anginosis 2/3 bottles from 10/11 TTE unremarkable.  ID consulted and recommended 2 weeks therapy, to finish with cefadroxil 1g BID EOT 10/29   Alcohol withdrawal delirium (HCC) Acute metabolic encephalopathy Hepatic encephalopathy Patient developed confusion/disorientation after transfer to ICU that was likely a combination of alcohol withdrawal delirium and hepatic encephalopathy.   Weaned off Precedex.  Completed phenobarbital taper. His encephalopathy is improved, ammonia is down to normal, patient has remained alert and oriented for last 2 days that I have seen.  He was seen by PT OT and they recommended SNF however patient declined SNF.  Thus he is going to be discharged home today with home health however he is refusing home health as well.  Lengthy discussion with the patient where I advised him regarding quitting alcohol completely.   Chronic kidney disease stage IV AKI ruled out Baseline SCr appears to be ~ 1.3 when he is off diuretics, but >2 when he is on diuretics (e.g. last Feb  Physician Discharge Summary  Martin Anthony ION:[id number] DOB: 21-Mar-1966 DOA: 03/23/2023  PCP: Nelwyn Salisbury, MD  Admit date: 03/23/2023 Discharge date: 04/08/2023 30 Day Unplanned Readmission Risk Score    Flowsheet Row ED to Hosp-Admission (Current) from 03/23/2023 in [facility] 4TH FLOOR PROGRESSIVE CARE AND UROLOGY  30 Day Unplanned Readmission Risk Score (%) 37.99 Filed at 04/08/2023 0801       This score is the patient's risk of an unplanned readmission within 30 days of being discharged (0 -100%). The score is based on dignosis, age, lab data, medications, orders, and past utilization.   Low:  0-14.9   Medium: 15-21.9   High: 22-29.9   Extreme: 30 and above          Admitted From: Home Disposition: Home  Recommendations for Outpatient Follow-up:  Follow up with PCP in 1-2 weeks Please obtain BMP/CBC in one week Follow-up with hepatology/gastroenterology in 2 to 4 weeks. Follow-up with hematology/oncology in 2 to 4 weeks for follow-up of colon cancer. Please follow up with your PCP on the following pending results: Unresulted Labs (From admission, onward)    None         Home Health: None, per TOC "no HHC that any agency will accept d/t insurance, & patient aware he had the option of going to a ST SNF & he declined"   Equipment/Devices: None  Discharge Condition: Stable CODE STATUS: Full code Diet recommendation: Low-sodium  Subjective: Seen and examined.  Feeling well.  No complaints.  Agrees with discharging home today and he is happy about that.  Brief/Interim Summary: Martin Anthony is a 57 y.o. M with advanced cirrhosis, colon CA s/p hemicolectomy in 2023 and ostomy in place, persistent alcohol dependence, portal HTN, hx varices who presented with blood in ileostomy.  Patient admitted and underwent EGD with banding of varices.  Developed recurrent hemorrhage with shock requiring vasopressors and transfer to ICU.    While in the ICU, delirium developed requiring  Precedex.  This was transitioned to phenobarbital and hemorrhage stabilized.    Subsequent hospital course complicated by persistent encephalopathy, recurrent ascites, streptococcal bacteremia.  Details below.   Esophageal varices with bleeding (HCC) Acute blood loss anemia S/p EGD on 03/25/23 with banding x 4  Did have recurrent bleeding on 10/11 but CTA showed only wall thickening and inflammatory change in duodenum, no extravasation.    Completed octreotide.  GI have signed off.  Hemoglobin stable. - Continue iron, PPI, nadolol    Streptococcal bacteremia Found to have strep anginosis 2/3 bottles from 10/11 TTE unremarkable.  ID consulted and recommended 2 weeks therapy, to finish with cefadroxil 1g BID EOT 10/29   Alcohol withdrawal delirium (HCC) Acute metabolic encephalopathy Hepatic encephalopathy Patient developed confusion/disorientation after transfer to ICU that was likely a combination of alcohol withdrawal delirium and hepatic encephalopathy.   Weaned off Precedex.  Completed phenobarbital taper. His encephalopathy is improved, ammonia is down to normal, patient has remained alert and oriented for last 2 days that I have seen.  He was seen by PT OT and they recommended SNF however patient declined SNF.  Thus he is going to be discharged home today with home health however he is refusing home health as well.  Lengthy discussion with the patient where I advised him regarding quitting alcohol completely.   Chronic kidney disease stage IV AKI ruled out Baseline SCr appears to be ~ 1.3 when he is off diuretics, but >2 when he is on diuretics (e.g. last Feb  immediate post procedural complication. Patient received post-procedure intravenous albumin; see nursing notes for details. FINDINGS: A total of approximately 3.5 liters of golden yellow fluid was removed. Samples were sent to the laboratory as requested by the clinical team. IMPRESSION: Successful ultrasound-guided diagnostic and therapeutic paracentesis yielding 3.5 liters of peritoneal fluid. PLAN: If the patient eventually requires >/=2 paracenteses in a 30 day period, candidacy for formal evaluation by the [facility] will be assessed. Performed by: Artemio Aly Electronically Signed   By: Acquanetta Belling M.D.   On: 04/01/2023 08:20   [name] ASCITES (ABDOMEN LIMITED)  Result Date: 03/29/2023 CLINICAL DATA:  Ascites.  Cirrhosis with alcoholism. EXAM: LIMITED ABDOMEN ULTRASOUND FOR ASCITES TECHNIQUE: Limited ultrasound survey for ascites was performed in all four abdominal quadrants. COMPARISON:  CT 03/26/2023 FINDINGS: Four quadrant survey images of the abdomen are obtained for evaluation of ascites. Moderate ascites is demonstrated in the right upper quadrant, right lower quadrant, and left lower quadrant. Incidental note of cirrhotic appearance to the liver. IMPRESSION: Moderate abdominal ascites is present. Electronically Signed   By: Burman Nieves M.D.   On: 03/29/2023 21:53   ECHOCARDIOGRAM COMPLETE  Result Date: 03/29/2023     ECHOCARDIOGRAM REPORT   Patient Name:   Martin Anthony Date of Exam: 03/29/2023 Medical Rec #:  [id number]    Height:       76.0 in Accession #:    [id number]   Weight:       198.2 lb Date of Birth:  03/28/1966   BSA:          2.207 m Patient Age:    56 years     BP:           99/46 mmHg Patient Gender: M            HR:           51 bpm. Exam Location:  Inpatient Procedure: 2D Echo, Cardiac Doppler and Color Doppler Indications:    Bacteremia  History:        Patient has no prior history of Echocardiogram examinations.                 Risk Factors:Diabetes and Hypertension.  Sonographer:    Darlys Gales Referring Phys: 13 ROBERT S BYRUM IMPRESSIONS  1. Left ventricular ejection fraction, by estimation, is 55 to 60%. The left ventricle has normal function. The left ventricle has no regional wall motion abnormalities. Left ventricular diastolic parameters are consistent with Grade I diastolic dysfunction (impaired relaxation).  2. Right ventricular systolic function is normal. The right ventricular size is normal. There is normal pulmonary artery systolic pressure. The estimated right ventricular systolic pressure is 25.5 mmHg.  3. The mitral valve is normal in structure. Trivial mitral valve regurgitation. No evidence of mitral stenosis.  4. The aortic valve is tricuspid. There is mild calcification of the aortic valve. Aortic valve regurgitation is trivial. No aortic stenosis is present.  5. The inferior vena cava is normal in size with <50% respiratory variability, suggesting right atrial pressure of 8 mmHg. FINDINGS  Left Ventricle: Left ventricular ejection fraction, by estimation, is 55 to 60%. The left ventricle has normal function. The left ventricle has no regional wall motion abnormalities. The left ventricular internal cavity size was normal in size. There is  no left ventricular hypertrophy. Left ventricular diastolic parameters are consistent with Grade I diastolic dysfunction (impaired relaxation). Right  Ventricle: The right ventricular size is normal. No increase  immediate post procedural complication. Patient received post-procedure intravenous albumin; see nursing notes for details. FINDINGS: A total of approximately 3.5 liters of golden yellow fluid was removed. Samples were sent to the laboratory as requested by the clinical team. IMPRESSION: Successful ultrasound-guided diagnostic and therapeutic paracentesis yielding 3.5 liters of peritoneal fluid. PLAN: If the patient eventually requires >/=2 paracenteses in a 30 day period, candidacy for formal evaluation by the [facility] will be assessed. Performed by: Artemio Aly Electronically Signed   By: Acquanetta Belling M.D.   On: 04/01/2023 08:20   [name] ASCITES (ABDOMEN LIMITED)  Result Date: 03/29/2023 CLINICAL DATA:  Ascites.  Cirrhosis with alcoholism. EXAM: LIMITED ABDOMEN ULTRASOUND FOR ASCITES TECHNIQUE: Limited ultrasound survey for ascites was performed in all four abdominal quadrants. COMPARISON:  CT 03/26/2023 FINDINGS: Four quadrant survey images of the abdomen are obtained for evaluation of ascites. Moderate ascites is demonstrated in the right upper quadrant, right lower quadrant, and left lower quadrant. Incidental note of cirrhotic appearance to the liver. IMPRESSION: Moderate abdominal ascites is present. Electronically Signed   By: Burman Nieves M.D.   On: 03/29/2023 21:53   ECHOCARDIOGRAM COMPLETE  Result Date: 03/29/2023     ECHOCARDIOGRAM REPORT   Patient Name:   Martin Anthony Date of Exam: 03/29/2023 Medical Rec #:  [id number]    Height:       76.0 in Accession #:    [id number]   Weight:       198.2 lb Date of Birth:  03/28/1966   BSA:          2.207 m Patient Age:    56 years     BP:           99/46 mmHg Patient Gender: M            HR:           51 bpm. Exam Location:  Inpatient Procedure: 2D Echo, Cardiac Doppler and Color Doppler Indications:    Bacteremia  History:        Patient has no prior history of Echocardiogram examinations.                 Risk Factors:Diabetes and Hypertension.  Sonographer:    Darlys Gales Referring Phys: 13 ROBERT S BYRUM IMPRESSIONS  1. Left ventricular ejection fraction, by estimation, is 55 to 60%. The left ventricle has normal function. The left ventricle has no regional wall motion abnormalities. Left ventricular diastolic parameters are consistent with Grade I diastolic dysfunction (impaired relaxation).  2. Right ventricular systolic function is normal. The right ventricular size is normal. There is normal pulmonary artery systolic pressure. The estimated right ventricular systolic pressure is 25.5 mmHg.  3. The mitral valve is normal in structure. Trivial mitral valve regurgitation. No evidence of mitral stenosis.  4. The aortic valve is tricuspid. There is mild calcification of the aortic valve. Aortic valve regurgitation is trivial. No aortic stenosis is present.  5. The inferior vena cava is normal in size with <50% respiratory variability, suggesting right atrial pressure of 8 mmHg. FINDINGS  Left Ventricle: Left ventricular ejection fraction, by estimation, is 55 to 60%. The left ventricle has normal function. The left ventricle has no regional wall motion abnormalities. The left ventricular internal cavity size was normal in size. There is  no left ventricular hypertrophy. Left ventricular diastolic parameters are consistent with Grade I diastolic dysfunction (impaired relaxation). Right  Ventricle: The right ventricular size is normal. No increase  immediate post procedural complication. Patient received post-procedure intravenous albumin; see nursing notes for details. FINDINGS: A total of approximately 3.5 liters of golden yellow fluid was removed. Samples were sent to the laboratory as requested by the clinical team. IMPRESSION: Successful ultrasound-guided diagnostic and therapeutic paracentesis yielding 3.5 liters of peritoneal fluid. PLAN: If the patient eventually requires >/=2 paracenteses in a 30 day period, candidacy for formal evaluation by the [facility] will be assessed. Performed by: Artemio Aly Electronically Signed   By: Acquanetta Belling M.D.   On: 04/01/2023 08:20   [name] ASCITES (ABDOMEN LIMITED)  Result Date: 03/29/2023 CLINICAL DATA:  Ascites.  Cirrhosis with alcoholism. EXAM: LIMITED ABDOMEN ULTRASOUND FOR ASCITES TECHNIQUE: Limited ultrasound survey for ascites was performed in all four abdominal quadrants. COMPARISON:  CT 03/26/2023 FINDINGS: Four quadrant survey images of the abdomen are obtained for evaluation of ascites. Moderate ascites is demonstrated in the right upper quadrant, right lower quadrant, and left lower quadrant. Incidental note of cirrhotic appearance to the liver. IMPRESSION: Moderate abdominal ascites is present. Electronically Signed   By: Burman Nieves M.D.   On: 03/29/2023 21:53   ECHOCARDIOGRAM COMPLETE  Result Date: 03/29/2023     ECHOCARDIOGRAM REPORT   Patient Name:   Martin Anthony Date of Exam: 03/29/2023 Medical Rec #:  [id number]    Height:       76.0 in Accession #:    [id number]   Weight:       198.2 lb Date of Birth:  03/28/1966   BSA:          2.207 m Patient Age:    56 years     BP:           99/46 mmHg Patient Gender: M            HR:           51 bpm. Exam Location:  Inpatient Procedure: 2D Echo, Cardiac Doppler and Color Doppler Indications:    Bacteremia  History:        Patient has no prior history of Echocardiogram examinations.                 Risk Factors:Diabetes and Hypertension.  Sonographer:    Darlys Gales Referring Phys: 13 ROBERT S BYRUM IMPRESSIONS  1. Left ventricular ejection fraction, by estimation, is 55 to 60%. The left ventricle has normal function. The left ventricle has no regional wall motion abnormalities. Left ventricular diastolic parameters are consistent with Grade I diastolic dysfunction (impaired relaxation).  2. Right ventricular systolic function is normal. The right ventricular size is normal. There is normal pulmonary artery systolic pressure. The estimated right ventricular systolic pressure is 25.5 mmHg.  3. The mitral valve is normal in structure. Trivial mitral valve regurgitation. No evidence of mitral stenosis.  4. The aortic valve is tricuspid. There is mild calcification of the aortic valve. Aortic valve regurgitation is trivial. No aortic stenosis is present.  5. The inferior vena cava is normal in size with <50% respiratory variability, suggesting right atrial pressure of 8 mmHg. FINDINGS  Left Ventricle: Left ventricular ejection fraction, by estimation, is 55 to 60%. The left ventricle has normal function. The left ventricle has no regional wall motion abnormalities. The left ventricular internal cavity size was normal in size. There is  no left ventricular hypertrophy. Left ventricular diastolic parameters are consistent with Grade I diastolic dysfunction (impaired relaxation). Right  Ventricle: The right ventricular size is normal. No increase  immediate post procedural complication. Patient received post-procedure intravenous albumin; see nursing notes for details. FINDINGS: A total of approximately 3.5 liters of golden yellow fluid was removed. Samples were sent to the laboratory as requested by the clinical team. IMPRESSION: Successful ultrasound-guided diagnostic and therapeutic paracentesis yielding 3.5 liters of peritoneal fluid. PLAN: If the patient eventually requires >/=2 paracenteses in a 30 day period, candidacy for formal evaluation by the [facility] will be assessed. Performed by: Artemio Aly Electronically Signed   By: Acquanetta Belling M.D.   On: 04/01/2023 08:20   [name] ASCITES (ABDOMEN LIMITED)  Result Date: 03/29/2023 CLINICAL DATA:  Ascites.  Cirrhosis with alcoholism. EXAM: LIMITED ABDOMEN ULTRASOUND FOR ASCITES TECHNIQUE: Limited ultrasound survey for ascites was performed in all four abdominal quadrants. COMPARISON:  CT 03/26/2023 FINDINGS: Four quadrant survey images of the abdomen are obtained for evaluation of ascites. Moderate ascites is demonstrated in the right upper quadrant, right lower quadrant, and left lower quadrant. Incidental note of cirrhotic appearance to the liver. IMPRESSION: Moderate abdominal ascites is present. Electronically Signed   By: Burman Nieves M.D.   On: 03/29/2023 21:53   ECHOCARDIOGRAM COMPLETE  Result Date: 03/29/2023     ECHOCARDIOGRAM REPORT   Patient Name:   Martin Anthony Date of Exam: 03/29/2023 Medical Rec #:  [id number]    Height:       76.0 in Accession #:    [id number]   Weight:       198.2 lb Date of Birth:  03/28/1966   BSA:          2.207 m Patient Age:    56 years     BP:           99/46 mmHg Patient Gender: M            HR:           51 bpm. Exam Location:  Inpatient Procedure: 2D Echo, Cardiac Doppler and Color Doppler Indications:    Bacteremia  History:        Patient has no prior history of Echocardiogram examinations.                 Risk Factors:Diabetes and Hypertension.  Sonographer:    Darlys Gales Referring Phys: 13 ROBERT S BYRUM IMPRESSIONS  1. Left ventricular ejection fraction, by estimation, is 55 to 60%. The left ventricle has normal function. The left ventricle has no regional wall motion abnormalities. Left ventricular diastolic parameters are consistent with Grade I diastolic dysfunction (impaired relaxation).  2. Right ventricular systolic function is normal. The right ventricular size is normal. There is normal pulmonary artery systolic pressure. The estimated right ventricular systolic pressure is 25.5 mmHg.  3. The mitral valve is normal in structure. Trivial mitral valve regurgitation. No evidence of mitral stenosis.  4. The aortic valve is tricuspid. There is mild calcification of the aortic valve. Aortic valve regurgitation is trivial. No aortic stenosis is present.  5. The inferior vena cava is normal in size with <50% respiratory variability, suggesting right atrial pressure of 8 mmHg. FINDINGS  Left Ventricle: Left ventricular ejection fraction, by estimation, is 55 to 60%. The left ventricle has normal function. The left ventricle has no regional wall motion abnormalities. The left ventricular internal cavity size was normal in size. There is  no left ventricular hypertrophy. Left ventricular diastolic parameters are consistent with Grade I diastolic dysfunction (impaired relaxation). Right  Ventricle: The right ventricular size is normal. No increase  Physician Discharge Summary  Martin Anthony ION:[id number] DOB: 21-Mar-1966 DOA: 03/23/2023  PCP: Nelwyn Salisbury, MD  Admit date: 03/23/2023 Discharge date: 04/08/2023 30 Day Unplanned Readmission Risk Score    Flowsheet Row ED to Hosp-Admission (Current) from 03/23/2023 in [facility] 4TH FLOOR PROGRESSIVE CARE AND UROLOGY  30 Day Unplanned Readmission Risk Score (%) 37.99 Filed at 04/08/2023 0801       This score is the patient's risk of an unplanned readmission within 30 days of being discharged (0 -100%). The score is based on dignosis, age, lab data, medications, orders, and past utilization.   Low:  0-14.9   Medium: 15-21.9   High: 22-29.9   Extreme: 30 and above          Admitted From: Home Disposition: Home  Recommendations for Outpatient Follow-up:  Follow up with PCP in 1-2 weeks Please obtain BMP/CBC in one week Follow-up with hepatology/gastroenterology in 2 to 4 weeks. Follow-up with hematology/oncology in 2 to 4 weeks for follow-up of colon cancer. Please follow up with your PCP on the following pending results: Unresulted Labs (From admission, onward)    None         Home Health: None, per TOC "no HHC that any agency will accept d/t insurance, & patient aware he had the option of going to a ST SNF & he declined"   Equipment/Devices: None  Discharge Condition: Stable CODE STATUS: Full code Diet recommendation: Low-sodium  Subjective: Seen and examined.  Feeling well.  No complaints.  Agrees with discharging home today and he is happy about that.  Brief/Interim Summary: Martin Anthony is a 57 y.o. M with advanced cirrhosis, colon CA s/p hemicolectomy in 2023 and ostomy in place, persistent alcohol dependence, portal HTN, hx varices who presented with blood in ileostomy.  Patient admitted and underwent EGD with banding of varices.  Developed recurrent hemorrhage with shock requiring vasopressors and transfer to ICU.    While in the ICU, delirium developed requiring  Precedex.  This was transitioned to phenobarbital and hemorrhage stabilized.    Subsequent hospital course complicated by persistent encephalopathy, recurrent ascites, streptococcal bacteremia.  Details below.   Esophageal varices with bleeding (HCC) Acute blood loss anemia S/p EGD on 03/25/23 with banding x 4  Did have recurrent bleeding on 10/11 but CTA showed only wall thickening and inflammatory change in duodenum, no extravasation.    Completed octreotide.  GI have signed off.  Hemoglobin stable. - Continue iron, PPI, nadolol    Streptococcal bacteremia Found to have strep anginosis 2/3 bottles from 10/11 TTE unremarkable.  ID consulted and recommended 2 weeks therapy, to finish with cefadroxil 1g BID EOT 10/29   Alcohol withdrawal delirium (HCC) Acute metabolic encephalopathy Hepatic encephalopathy Patient developed confusion/disorientation after transfer to ICU that was likely a combination of alcohol withdrawal delirium and hepatic encephalopathy.   Weaned off Precedex.  Completed phenobarbital taper. His encephalopathy is improved, ammonia is down to normal, patient has remained alert and oriented for last 2 days that I have seen.  He was seen by PT OT and they recommended SNF however patient declined SNF.  Thus he is going to be discharged home today with home health however he is refusing home health as well.  Lengthy discussion with the patient where I advised him regarding quitting alcohol completely.   Chronic kidney disease stage IV AKI ruled out Baseline SCr appears to be ~ 1.3 when he is off diuretics, but >2 when he is on diuretics (e.g. last Feb  immediate post procedural complication. Patient received post-procedure intravenous albumin; see nursing notes for details. FINDINGS: A total of approximately 3.5 liters of golden yellow fluid was removed. Samples were sent to the laboratory as requested by the clinical team. IMPRESSION: Successful ultrasound-guided diagnostic and therapeutic paracentesis yielding 3.5 liters of peritoneal fluid. PLAN: If the patient eventually requires >/=2 paracenteses in a 30 day period, candidacy for formal evaluation by the [facility] will be assessed. Performed by: Artemio Aly Electronically Signed   By: Acquanetta Belling M.D.   On: 04/01/2023 08:20   [name] ASCITES (ABDOMEN LIMITED)  Result Date: 03/29/2023 CLINICAL DATA:  Ascites.  Cirrhosis with alcoholism. EXAM: LIMITED ABDOMEN ULTRASOUND FOR ASCITES TECHNIQUE: Limited ultrasound survey for ascites was performed in all four abdominal quadrants. COMPARISON:  CT 03/26/2023 FINDINGS: Four quadrant survey images of the abdomen are obtained for evaluation of ascites. Moderate ascites is demonstrated in the right upper quadrant, right lower quadrant, and left lower quadrant. Incidental note of cirrhotic appearance to the liver. IMPRESSION: Moderate abdominal ascites is present. Electronically Signed   By: Burman Nieves M.D.   On: 03/29/2023 21:53   ECHOCARDIOGRAM COMPLETE  Result Date: 03/29/2023     ECHOCARDIOGRAM REPORT   Patient Name:   Martin Anthony Date of Exam: 03/29/2023 Medical Rec #:  [id number]    Height:       76.0 in Accession #:    [id number]   Weight:       198.2 lb Date of Birth:  03/28/1966   BSA:          2.207 m Patient Age:    56 years     BP:           99/46 mmHg Patient Gender: M            HR:           51 bpm. Exam Location:  Inpatient Procedure: 2D Echo, Cardiac Doppler and Color Doppler Indications:    Bacteremia  History:        Patient has no prior history of Echocardiogram examinations.                 Risk Factors:Diabetes and Hypertension.  Sonographer:    Darlys Gales Referring Phys: 13 ROBERT S BYRUM IMPRESSIONS  1. Left ventricular ejection fraction, by estimation, is 55 to 60%. The left ventricle has normal function. The left ventricle has no regional wall motion abnormalities. Left ventricular diastolic parameters are consistent with Grade I diastolic dysfunction (impaired relaxation).  2. Right ventricular systolic function is normal. The right ventricular size is normal. There is normal pulmonary artery systolic pressure. The estimated right ventricular systolic pressure is 25.5 mmHg.  3. The mitral valve is normal in structure. Trivial mitral valve regurgitation. No evidence of mitral stenosis.  4. The aortic valve is tricuspid. There is mild calcification of the aortic valve. Aortic valve regurgitation is trivial. No aortic stenosis is present.  5. The inferior vena cava is normal in size with <50% respiratory variability, suggesting right atrial pressure of 8 mmHg. FINDINGS  Left Ventricle: Left ventricular ejection fraction, by estimation, is 55 to 60%. The left ventricle has normal function. The left ventricle has no regional wall motion abnormalities. The left ventricular internal cavity size was normal in size. There is  no left ventricular hypertrophy. Left ventricular diastolic parameters are consistent with Grade I diastolic dysfunction (impaired relaxation). Right  Ventricle: The right ventricular size is normal. No increase  immediate post procedural complication. Patient received post-procedure intravenous albumin; see nursing notes for details. FINDINGS: A total of approximately 3.5 liters of golden yellow fluid was removed. Samples were sent to the laboratory as requested by the clinical team. IMPRESSION: Successful ultrasound-guided diagnostic and therapeutic paracentesis yielding 3.5 liters of peritoneal fluid. PLAN: If the patient eventually requires >/=2 paracenteses in a 30 day period, candidacy for formal evaluation by the [facility] will be assessed. Performed by: Artemio Aly Electronically Signed   By: Acquanetta Belling M.D.   On: 04/01/2023 08:20   [name] ASCITES (ABDOMEN LIMITED)  Result Date: 03/29/2023 CLINICAL DATA:  Ascites.  Cirrhosis with alcoholism. EXAM: LIMITED ABDOMEN ULTRASOUND FOR ASCITES TECHNIQUE: Limited ultrasound survey for ascites was performed in all four abdominal quadrants. COMPARISON:  CT 03/26/2023 FINDINGS: Four quadrant survey images of the abdomen are obtained for evaluation of ascites. Moderate ascites is demonstrated in the right upper quadrant, right lower quadrant, and left lower quadrant. Incidental note of cirrhotic appearance to the liver. IMPRESSION: Moderate abdominal ascites is present. Electronically Signed   By: Burman Nieves M.D.   On: 03/29/2023 21:53   ECHOCARDIOGRAM COMPLETE  Result Date: 03/29/2023     ECHOCARDIOGRAM REPORT   Patient Name:   Martin Anthony Date of Exam: 03/29/2023 Medical Rec #:  [id number]    Height:       76.0 in Accession #:    [id number]   Weight:       198.2 lb Date of Birth:  03/28/1966   BSA:          2.207 m Patient Age:    56 years     BP:           99/46 mmHg Patient Gender: M            HR:           51 bpm. Exam Location:  Inpatient Procedure: 2D Echo, Cardiac Doppler and Color Doppler Indications:    Bacteremia  History:        Patient has no prior history of Echocardiogram examinations.                 Risk Factors:Diabetes and Hypertension.  Sonographer:    Darlys Gales Referring Phys: 13 ROBERT S BYRUM IMPRESSIONS  1. Left ventricular ejection fraction, by estimation, is 55 to 60%. The left ventricle has normal function. The left ventricle has no regional wall motion abnormalities. Left ventricular diastolic parameters are consistent with Grade I diastolic dysfunction (impaired relaxation).  2. Right ventricular systolic function is normal. The right ventricular size is normal. There is normal pulmonary artery systolic pressure. The estimated right ventricular systolic pressure is 25.5 mmHg.  3. The mitral valve is normal in structure. Trivial mitral valve regurgitation. No evidence of mitral stenosis.  4. The aortic valve is tricuspid. There is mild calcification of the aortic valve. Aortic valve regurgitation is trivial. No aortic stenosis is present.  5. The inferior vena cava is normal in size with <50% respiratory variability, suggesting right atrial pressure of 8 mmHg. FINDINGS  Left Ventricle: Left ventricular ejection fraction, by estimation, is 55 to 60%. The left ventricle has normal function. The left ventricle has no regional wall motion abnormalities. The left ventricular internal cavity size was normal in size. There is  no left ventricular hypertrophy. Left ventricular diastolic parameters are consistent with Grade I diastolic dysfunction (impaired relaxation). Right  Ventricle: The right ventricular size is normal. No increase  Physician Discharge Summary  Martin Anthony ION:[id number] DOB: 21-Mar-1966 DOA: 03/23/2023  PCP: Nelwyn Salisbury, MD  Admit date: 03/23/2023 Discharge date: 04/08/2023 30 Day Unplanned Readmission Risk Score    Flowsheet Row ED to Hosp-Admission (Current) from 03/23/2023 in [facility] 4TH FLOOR PROGRESSIVE CARE AND UROLOGY  30 Day Unplanned Readmission Risk Score (%) 37.99 Filed at 04/08/2023 0801       This score is the patient's risk of an unplanned readmission within 30 days of being discharged (0 -100%). The score is based on dignosis, age, lab data, medications, orders, and past utilization.   Low:  0-14.9   Medium: 15-21.9   High: 22-29.9   Extreme: 30 and above          Admitted From: Home Disposition: Home  Recommendations for Outpatient Follow-up:  Follow up with PCP in 1-2 weeks Please obtain BMP/CBC in one week Follow-up with hepatology/gastroenterology in 2 to 4 weeks. Follow-up with hematology/oncology in 2 to 4 weeks for follow-up of colon cancer. Please follow up with your PCP on the following pending results: Unresulted Labs (From admission, onward)    None         Home Health: None, per TOC "no HHC that any agency will accept d/t insurance, & patient aware he had the option of going to a ST SNF & he declined"   Equipment/Devices: None  Discharge Condition: Stable CODE STATUS: Full code Diet recommendation: Low-sodium  Subjective: Seen and examined.  Feeling well.  No complaints.  Agrees with discharging home today and he is happy about that.  Brief/Interim Summary: Martin Anthony is a 57 y.o. M with advanced cirrhosis, colon CA s/p hemicolectomy in 2023 and ostomy in place, persistent alcohol dependence, portal HTN, hx varices who presented with blood in ileostomy.  Patient admitted and underwent EGD with banding of varices.  Developed recurrent hemorrhage with shock requiring vasopressors and transfer to ICU.    While in the ICU, delirium developed requiring  Precedex.  This was transitioned to phenobarbital and hemorrhage stabilized.    Subsequent hospital course complicated by persistent encephalopathy, recurrent ascites, streptococcal bacteremia.  Details below.   Esophageal varices with bleeding (HCC) Acute blood loss anemia S/p EGD on 03/25/23 with banding x 4  Did have recurrent bleeding on 10/11 but CTA showed only wall thickening and inflammatory change in duodenum, no extravasation.    Completed octreotide.  GI have signed off.  Hemoglobin stable. - Continue iron, PPI, nadolol    Streptococcal bacteremia Found to have strep anginosis 2/3 bottles from 10/11 TTE unremarkable.  ID consulted and recommended 2 weeks therapy, to finish with cefadroxil 1g BID EOT 10/29   Alcohol withdrawal delirium (HCC) Acute metabolic encephalopathy Hepatic encephalopathy Patient developed confusion/disorientation after transfer to ICU that was likely a combination of alcohol withdrawal delirium and hepatic encephalopathy.   Weaned off Precedex.  Completed phenobarbital taper. His encephalopathy is improved, ammonia is down to normal, patient has remained alert and oriented for last 2 days that I have seen.  He was seen by PT OT and they recommended SNF however patient declined SNF.  Thus he is going to be discharged home today with home health however he is refusing home health as well.  Lengthy discussion with the patient where I advised him regarding quitting alcohol completely.   Chronic kidney disease stage IV AKI ruled out Baseline SCr appears to be ~ 1.3 when he is off diuretics, but >2 when he is on diuretics (e.g. last Feb  Physician Discharge Summary  Martin Anthony ION:[id number] DOB: 21-Mar-1966 DOA: 03/23/2023  PCP: Nelwyn Salisbury, MD  Admit date: 03/23/2023 Discharge date: 04/08/2023 30 Day Unplanned Readmission Risk Score    Flowsheet Row ED to Hosp-Admission (Current) from 03/23/2023 in [facility] 4TH FLOOR PROGRESSIVE CARE AND UROLOGY  30 Day Unplanned Readmission Risk Score (%) 37.99 Filed at 04/08/2023 0801       This score is the patient's risk of an unplanned readmission within 30 days of being discharged (0 -100%). The score is based on dignosis, age, lab data, medications, orders, and past utilization.   Low:  0-14.9   Medium: 15-21.9   High: 22-29.9   Extreme: 30 and above          Admitted From: Home Disposition: Home  Recommendations for Outpatient Follow-up:  Follow up with PCP in 1-2 weeks Please obtain BMP/CBC in one week Follow-up with hepatology/gastroenterology in 2 to 4 weeks. Follow-up with hematology/oncology in 2 to 4 weeks for follow-up of colon cancer. Please follow up with your PCP on the following pending results: Unresulted Labs (From admission, onward)    None         Home Health: None, per TOC "no HHC that any agency will accept d/t insurance, & patient aware he had the option of going to a ST SNF & he declined"   Equipment/Devices: None  Discharge Condition: Stable CODE STATUS: Full code Diet recommendation: Low-sodium  Subjective: Seen and examined.  Feeling well.  No complaints.  Agrees with discharging home today and he is happy about that.  Brief/Interim Summary: Martin Anthony is a 57 y.o. M with advanced cirrhosis, colon CA s/p hemicolectomy in 2023 and ostomy in place, persistent alcohol dependence, portal HTN, hx varices who presented with blood in ileostomy.  Patient admitted and underwent EGD with banding of varices.  Developed recurrent hemorrhage with shock requiring vasopressors and transfer to ICU.    While in the ICU, delirium developed requiring  Precedex.  This was transitioned to phenobarbital and hemorrhage stabilized.    Subsequent hospital course complicated by persistent encephalopathy, recurrent ascites, streptococcal bacteremia.  Details below.   Esophageal varices with bleeding (HCC) Acute blood loss anemia S/p EGD on 03/25/23 with banding x 4  Did have recurrent bleeding on 10/11 but CTA showed only wall thickening and inflammatory change in duodenum, no extravasation.    Completed octreotide.  GI have signed off.  Hemoglobin stable. - Continue iron, PPI, nadolol    Streptococcal bacteremia Found to have strep anginosis 2/3 bottles from 10/11 TTE unremarkable.  ID consulted and recommended 2 weeks therapy, to finish with cefadroxil 1g BID EOT 10/29   Alcohol withdrawal delirium (HCC) Acute metabolic encephalopathy Hepatic encephalopathy Patient developed confusion/disorientation after transfer to ICU that was likely a combination of alcohol withdrawal delirium and hepatic encephalopathy.   Weaned off Precedex.  Completed phenobarbital taper. His encephalopathy is improved, ammonia is down to normal, patient has remained alert and oriented for last 2 days that I have seen.  He was seen by PT OT and they recommended SNF however patient declined SNF.  Thus he is going to be discharged home today with home health however he is refusing home health as well.  Lengthy discussion with the patient where I advised him regarding quitting alcohol completely.   Chronic kidney disease stage IV AKI ruled out Baseline SCr appears to be ~ 1.3 when he is off diuretics, but >2 when he is on diuretics (e.g. last Feb  immediate post procedural complication. Patient received post-procedure intravenous albumin; see nursing notes for details. FINDINGS: A total of approximately 3.5 liters of golden yellow fluid was removed. Samples were sent to the laboratory as requested by the clinical team. IMPRESSION: Successful ultrasound-guided diagnostic and therapeutic paracentesis yielding 3.5 liters of peritoneal fluid. PLAN: If the patient eventually requires >/=2 paracenteses in a 30 day period, candidacy for formal evaluation by the [facility] will be assessed. Performed by: Artemio Aly Electronically Signed   By: Acquanetta Belling M.D.   On: 04/01/2023 08:20   [name] ASCITES (ABDOMEN LIMITED)  Result Date: 03/29/2023 CLINICAL DATA:  Ascites.  Cirrhosis with alcoholism. EXAM: LIMITED ABDOMEN ULTRASOUND FOR ASCITES TECHNIQUE: Limited ultrasound survey for ascites was performed in all four abdominal quadrants. COMPARISON:  CT 03/26/2023 FINDINGS: Four quadrant survey images of the abdomen are obtained for evaluation of ascites. Moderate ascites is demonstrated in the right upper quadrant, right lower quadrant, and left lower quadrant. Incidental note of cirrhotic appearance to the liver. IMPRESSION: Moderate abdominal ascites is present. Electronically Signed   By: Burman Nieves M.D.   On: 03/29/2023 21:53   ECHOCARDIOGRAM COMPLETE  Result Date: 03/29/2023     ECHOCARDIOGRAM REPORT   Patient Name:   Martin Anthony Date of Exam: 03/29/2023 Medical Rec #:  [id number]    Height:       76.0 in Accession #:    [id number]   Weight:       198.2 lb Date of Birth:  03/28/1966   BSA:          2.207 m Patient Age:    56 years     BP:           99/46 mmHg Patient Gender: M            HR:           51 bpm. Exam Location:  Inpatient Procedure: 2D Echo, Cardiac Doppler and Color Doppler Indications:    Bacteremia  History:        Patient has no prior history of Echocardiogram examinations.                 Risk Factors:Diabetes and Hypertension.  Sonographer:    Darlys Gales Referring Phys: 13 ROBERT S BYRUM IMPRESSIONS  1. Left ventricular ejection fraction, by estimation, is 55 to 60%. The left ventricle has normal function. The left ventricle has no regional wall motion abnormalities. Left ventricular diastolic parameters are consistent with Grade I diastolic dysfunction (impaired relaxation).  2. Right ventricular systolic function is normal. The right ventricular size is normal. There is normal pulmonary artery systolic pressure. The estimated right ventricular systolic pressure is 25.5 mmHg.  3. The mitral valve is normal in structure. Trivial mitral valve regurgitation. No evidence of mitral stenosis.  4. The aortic valve is tricuspid. There is mild calcification of the aortic valve. Aortic valve regurgitation is trivial. No aortic stenosis is present.  5. The inferior vena cava is normal in size with <50% respiratory variability, suggesting right atrial pressure of 8 mmHg. FINDINGS  Left Ventricle: Left ventricular ejection fraction, by estimation, is 55 to 60%. The left ventricle has normal function. The left ventricle has no regional wall motion abnormalities. The left ventricular internal cavity size was normal in size. There is  no left ventricular hypertrophy. Left ventricular diastolic parameters are consistent with Grade I diastolic dysfunction (impaired relaxation). Right  Ventricle: The right ventricular size is normal. No increase  Physician Discharge Summary  Martin Anthony ION:[id number] DOB: 21-Mar-1966 DOA: 03/23/2023  PCP: Nelwyn Salisbury, MD  Admit date: 03/23/2023 Discharge date: 04/08/2023 30 Day Unplanned Readmission Risk Score    Flowsheet Row ED to Hosp-Admission (Current) from 03/23/2023 in [facility] 4TH FLOOR PROGRESSIVE CARE AND UROLOGY  30 Day Unplanned Readmission Risk Score (%) 37.99 Filed at 04/08/2023 0801       This score is the patient's risk of an unplanned readmission within 30 days of being discharged (0 -100%). The score is based on dignosis, age, lab data, medications, orders, and past utilization.   Low:  0-14.9   Medium: 15-21.9   High: 22-29.9   Extreme: 30 and above          Admitted From: Home Disposition: Home  Recommendations for Outpatient Follow-up:  Follow up with PCP in 1-2 weeks Please obtain BMP/CBC in one week Follow-up with hepatology/gastroenterology in 2 to 4 weeks. Follow-up with hematology/oncology in 2 to 4 weeks for follow-up of colon cancer. Please follow up with your PCP on the following pending results: Unresulted Labs (From admission, onward)    None         Home Health: None, per TOC "no HHC that any agency will accept d/t insurance, & patient aware he had the option of going to a ST SNF & he declined"   Equipment/Devices: None  Discharge Condition: Stable CODE STATUS: Full code Diet recommendation: Low-sodium  Subjective: Seen and examined.  Feeling well.  No complaints.  Agrees with discharging home today and he is happy about that.  Brief/Interim Summary: Martin Anthony is a 57 y.o. M with advanced cirrhosis, colon CA s/p hemicolectomy in 2023 and ostomy in place, persistent alcohol dependence, portal HTN, hx varices who presented with blood in ileostomy.  Patient admitted and underwent EGD with banding of varices.  Developed recurrent hemorrhage with shock requiring vasopressors and transfer to ICU.    While in the ICU, delirium developed requiring  Precedex.  This was transitioned to phenobarbital and hemorrhage stabilized.    Subsequent hospital course complicated by persistent encephalopathy, recurrent ascites, streptococcal bacteremia.  Details below.   Esophageal varices with bleeding (HCC) Acute blood loss anemia S/p EGD on 03/25/23 with banding x 4  Did have recurrent bleeding on 10/11 but CTA showed only wall thickening and inflammatory change in duodenum, no extravasation.    Completed octreotide.  GI have signed off.  Hemoglobin stable. - Continue iron, PPI, nadolol    Streptococcal bacteremia Found to have strep anginosis 2/3 bottles from 10/11 TTE unremarkable.  ID consulted and recommended 2 weeks therapy, to finish with cefadroxil 1g BID EOT 10/29   Alcohol withdrawal delirium (HCC) Acute metabolic encephalopathy Hepatic encephalopathy Patient developed confusion/disorientation after transfer to ICU that was likely a combination of alcohol withdrawal delirium and hepatic encephalopathy.   Weaned off Precedex.  Completed phenobarbital taper. His encephalopathy is improved, ammonia is down to normal, patient has remained alert and oriented for last 2 days that I have seen.  He was seen by PT OT and they recommended SNF however patient declined SNF.  Thus he is going to be discharged home today with home health however he is refusing home health as well.  Lengthy discussion with the patient where I advised him regarding quitting alcohol completely.   Chronic kidney disease stage IV AKI ruled out Baseline SCr appears to be ~ 1.3 when he is off diuretics, but >2 when he is on diuretics (e.g. last Feb  immediate post procedural complication. Patient received post-procedure intravenous albumin; see nursing notes for details. FINDINGS: A total of approximately 3.5 liters of golden yellow fluid was removed. Samples were sent to the laboratory as requested by the clinical team. IMPRESSION: Successful ultrasound-guided diagnostic and therapeutic paracentesis yielding 3.5 liters of peritoneal fluid. PLAN: If the patient eventually requires >/=2 paracenteses in a 30 day period, candidacy for formal evaluation by the [facility] will be assessed. Performed by: Artemio Aly Electronically Signed   By: Acquanetta Belling M.D.   On: 04/01/2023 08:20   [name] ASCITES (ABDOMEN LIMITED)  Result Date: 03/29/2023 CLINICAL DATA:  Ascites.  Cirrhosis with alcoholism. EXAM: LIMITED ABDOMEN ULTRASOUND FOR ASCITES TECHNIQUE: Limited ultrasound survey for ascites was performed in all four abdominal quadrants. COMPARISON:  CT 03/26/2023 FINDINGS: Four quadrant survey images of the abdomen are obtained for evaluation of ascites. Moderate ascites is demonstrated in the right upper quadrant, right lower quadrant, and left lower quadrant. Incidental note of cirrhotic appearance to the liver. IMPRESSION: Moderate abdominal ascites is present. Electronically Signed   By: Burman Nieves M.D.   On: 03/29/2023 21:53   ECHOCARDIOGRAM COMPLETE  Result Date: 03/29/2023     ECHOCARDIOGRAM REPORT   Patient Name:   Martin Anthony Date of Exam: 03/29/2023 Medical Rec #:  [id number]    Height:       76.0 in Accession #:    [id number]   Weight:       198.2 lb Date of Birth:  03/28/1966   BSA:          2.207 m Patient Age:    56 years     BP:           99/46 mmHg Patient Gender: M            HR:           51 bpm. Exam Location:  Inpatient Procedure: 2D Echo, Cardiac Doppler and Color Doppler Indications:    Bacteremia  History:        Patient has no prior history of Echocardiogram examinations.                 Risk Factors:Diabetes and Hypertension.  Sonographer:    Darlys Gales Referring Phys: 13 ROBERT S BYRUM IMPRESSIONS  1. Left ventricular ejection fraction, by estimation, is 55 to 60%. The left ventricle has normal function. The left ventricle has no regional wall motion abnormalities. Left ventricular diastolic parameters are consistent with Grade I diastolic dysfunction (impaired relaxation).  2. Right ventricular systolic function is normal. The right ventricular size is normal. There is normal pulmonary artery systolic pressure. The estimated right ventricular systolic pressure is 25.5 mmHg.  3. The mitral valve is normal in structure. Trivial mitral valve regurgitation. No evidence of mitral stenosis.  4. The aortic valve is tricuspid. There is mild calcification of the aortic valve. Aortic valve regurgitation is trivial. No aortic stenosis is present.  5. The inferior vena cava is normal in size with <50% respiratory variability, suggesting right atrial pressure of 8 mmHg. FINDINGS  Left Ventricle: Left ventricular ejection fraction, by estimation, is 55 to 60%. The left ventricle has normal function. The left ventricle has no regional wall motion abnormalities. The left ventricular internal cavity size was normal in size. There is  no left ventricular hypertrophy. Left ventricular diastolic parameters are consistent with Grade I diastolic dysfunction (impaired relaxation). Right  Ventricle: The right ventricular size is normal. No increase

## 2023-04-25 ENCOUNTER — Other Ambulatory Visit: Payer: Self-pay | Admitting: Family Medicine

## 2023-05-03 ENCOUNTER — Encounter: Payer: Self-pay | Admitting: Family Medicine

## 2023-05-03 ENCOUNTER — Ambulatory Visit (INDEPENDENT_AMBULATORY_CARE_PROVIDER_SITE_OTHER): Payer: Medicaid Other | Admitting: Family Medicine

## 2023-05-03 VITALS — BP 100/58 | HR 68 | Temp 98.9°F | Wt 173.8 lb

## 2023-05-03 DIAGNOSIS — G9340 Encephalopathy, unspecified: Secondary | ICD-10-CM

## 2023-05-03 DIAGNOSIS — R7881 Bacteremia: Secondary | ICD-10-CM

## 2023-05-03 DIAGNOSIS — N171 Acute kidney failure with acute cortical necrosis: Secondary | ICD-10-CM

## 2023-05-03 DIAGNOSIS — N182 Chronic kidney disease, stage 2 (mild): Secondary | ICD-10-CM

## 2023-05-03 DIAGNOSIS — I10 Essential (primary) hypertension: Secondary | ICD-10-CM

## 2023-05-03 DIAGNOSIS — F1010 Alcohol abuse, uncomplicated: Secondary | ICD-10-CM

## 2023-05-03 DIAGNOSIS — K7031 Alcoholic cirrhosis of liver with ascites: Secondary | ICD-10-CM | POA: Diagnosis not present

## 2023-05-03 DIAGNOSIS — I8511 Secondary esophageal varices with bleeding: Secondary | ICD-10-CM | POA: Diagnosis not present

## 2023-05-03 DIAGNOSIS — K4090 Unilateral inguinal hernia, without obstruction or gangrene, not specified as recurrent: Secondary | ICD-10-CM

## 2023-05-03 DIAGNOSIS — C182 Malignant neoplasm of ascending colon: Secondary | ICD-10-CM

## 2023-05-03 DIAGNOSIS — B955 Unspecified streptococcus as the cause of diseases classified elsewhere: Secondary | ICD-10-CM

## 2023-05-03 DIAGNOSIS — E871 Hypo-osmolality and hyponatremia: Secondary | ICD-10-CM

## 2023-05-03 DIAGNOSIS — D696 Thrombocytopenia, unspecified: Secondary | ICD-10-CM

## 2023-05-03 DIAGNOSIS — N1832 Chronic kidney disease, stage 3b: Secondary | ICD-10-CM

## 2023-05-03 MED ORDER — NYSTATIN 100000 UNIT/GM EX POWD: 1.0000 | Freq: Three times a day (TID) | CUTANEOUS | 5 refills | Status: DC

## 2023-05-03 NOTE — Progress Notes (Signed)
Subjective:    Patient ID: Martin Anthony, male    DOB: 1965/11/14, 57 y.o.   MRN: [id number]  HPI Here to follow up on a hospital stay from 03-23-23 to 04-08-23 for bleeding esophageal varices. On the day of admission he developed upper abdominal pain and his ileostomy bag filled up with blood. He called EMS and was taken to the ED. His hGb on admission was 7.1, and he was transfused 2 units of blood. He had upper and lower endoscopies, and the upper showed esophageal varices as well as duodenitis. His small ileum looked clear. He had 2 CTA procedures and 2 abdominal US, but no active bleeding was found. In any event ,the bleeding stopped and it was felt the esophageal varices were the most likely source. His total bilirubin was 9.4. His AST was mildly elevated to 71 and the ALT was normal at 25. His ammonia level was elevated to 43. After the transfusions his Hb came up to 8.0 at DC. The WBC was normal at 4.3 and the platelets were low at 71. Since going home he has felt fairly well except for occasional bouts of RUQ abdominal pain. No nausea or vomiting or fever. His stools have returned to a normal appearance with no more blood seen. He is eating a bland diet consisting mostly of starchy foods. In addition we had noted the presence of a rather large right direct inguinal hernia about 6 months ago at his last clinic visit here. He says it causes pain from time to time, and it has gotten larger. He is proud to tell mer that he has completely quit drinking alcohol and he has stopped smoking since his DC home.    Review of Systems  Constitutional: Negative.   Respiratory: Negative.    Cardiovascular: Negative.   Gastrointestinal:  Positive for abdominal distention and abdominal pain. Negative for blood in stool, constipation, diarrhea, nausea, rectal pain and vomiting.  Genitourinary: Negative.   Neurological: Negative.        Objective:   Physical Exam Constitutional:      Appearance: Normal  appearance.  Cardiovascular:     Rate and Rhythm: Normal rate and regular rhythm.     Pulses: Normal pulses.     Heart sounds: Normal heart sounds.  Pulmonary:     Effort: Pulmonary effort is normal. No respiratory distress.     Breath sounds: Normal breath sounds.  Abdominal:     General: Bowel sounds are normal. There is distension.     Palpations: Abdomen is soft. There is no mass.     Tenderness: There is no abdominal tenderness. There is no right CVA tenderness, left CVA tenderness, guarding or rebound.     Hernia: No hernia is present.  Neurological:     Mental Status: He is alert.           Assessment & Plan:  He is recovering from bleeding from esophageal varices. He clams to have stopped all drinking alcohol and all smoking, and I congratulated him on this. We will follw the anemia and thrombocytopenia today with a CBC. We will follow the CKD and the hyponatremia today with a BMET. He plans to follow up with his GI and Oncology doctors in [place] next week. He has been referred to see Dr. Gustavus Messing at [facility] to evaluate the inguinal hernia. We spent a total of ( 35  ) minutes reviewing records and discussing these issues.  Gershon Crane, MD

## 2023-05-04 ENCOUNTER — Telehealth: Payer: Self-pay

## 2023-05-04 ENCOUNTER — Encounter: Payer: Self-pay | Admitting: Family Medicine

## 2023-05-04 LAB — BASIC METABOLIC PANEL
BUN: 25 mg/dL — ABNORMAL HIGH (ref 6–23)
CO2: 20 meq/L (ref 19–32)
Calcium: 8.5 mg/dL (ref 8.4–10.5)
Chloride: 107 meq/L (ref 96–112)
Creatinine, Ser: 2.14 mg/dL — ABNORMAL HIGH (ref 0.40–1.50)
GFR: 33.88 mL/min — ABNORMAL LOW (ref >60.00)
Glucose, Bld: 249 mg/dL — ABNORMAL HIGH (ref 70–99)
Potassium: 4.1 meq/L (ref 3.5–5.1)
Sodium: 136 meq/L (ref 135–145)

## 2023-05-04 LAB — CBC WITH DIFFERENTIAL/PLATELET
Basophils Absolute: 0 10*3/uL (ref 0.0–0.1)
Basophils Relative: 0.9 % (ref 0.0–3.0)
Eosinophils Absolute: 0.1 10*3/uL (ref 0.0–0.7)
Eosinophils Relative: 2.5 % (ref 0.0–5.0)
HCT: 19.1 % — CL (ref 39.0–52.0)
Hemoglobin: 6.4 g/dL — CL (ref 13.0–17.0)
Lymphocytes Relative: 14.8 % (ref 12.0–46.0)
Lymphs Abs: 0.4 10*3/uL — ABNORMAL LOW (ref 0.7–4.0)
MCHC: 33.6 g/dL (ref 30.0–36.0)
MCV: 100.8 fL — ABNORMAL HIGH (ref 78.0–100.0)
Monocytes Absolute: 0.2 10*3/uL (ref 0.1–1.0)
Monocytes Relative: 7.9 % (ref 3.0–12.0)
Neutro Abs: 2.2 10*3/uL (ref 1.4–7.7)
Neutrophils Relative %: 73.9 % (ref 43.0–77.0)
Platelets: 83 10*3/uL — ABNORMAL LOW (ref 150.0–400.0)
RBC: 1.89 Mil/uL — ABNORMAL LOW (ref 4.22–5.81)
RDW: 15.6 % — ABNORMAL HIGH (ref 11.5–15.5)
WBC: 2.9 10*3/uL — ABNORMAL LOW (ref 4.0–10.5)

## 2023-05-04 NOTE — Telephone Encounter (Signed)
CRITICAL VALUE STICKER  CRITICAL VALUE:  Hemoglobin -6.4 Hematocrit- 19.1  RECEIVER (on-site recipient of call):  DATE & TIME NOTIFIED:   MESSENGER (representative from lab): Clydie Braun from [facility]  MD NOTIFIED: Dr. Clent Ridges  TIME OF NOTIFICATION:  RESPONSE:

## 2023-05-05 NOTE — Addendum Note (Signed)
Addended by: Gershon Crane A on: 05/05/2023 01:30 PM   Modules accepted: Orders

## 2023-05-05 NOTE — Telephone Encounter (Signed)
Noted  

## 2023-05-07 ENCOUNTER — Encounter (HOSPITAL_COMMUNITY): Payer: Self-pay

## 2023-05-07 ENCOUNTER — Other Ambulatory Visit: Payer: Self-pay

## 2023-05-07 ENCOUNTER — Inpatient Hospital Stay (HOSPITAL_COMMUNITY)
Admission: EM | Admit: 2023-05-07 | Discharge: 2023-05-12 | DRG: 377 | Disposition: A | Discharge status: Home / Self Care | Payer: Medicaid Other | Attending: Internal Medicine | Admitting: Internal Medicine

## 2023-05-07 ENCOUNTER — Inpatient Hospital Stay (HOSPITAL_COMMUNITY): Payer: Medicaid Other

## 2023-05-07 DIAGNOSIS — N179 Acute kidney failure, unspecified: Secondary | ICD-10-CM | POA: Diagnosis present

## 2023-05-07 DIAGNOSIS — K2981 Duodenitis with bleeding: Principal | ICD-10-CM | POA: Diagnosis present

## 2023-05-07 DIAGNOSIS — K922 Gastrointestinal hemorrhage, unspecified: Principal | ICD-10-CM

## 2023-05-07 DIAGNOSIS — E872 Acidosis, unspecified: Secondary | ICD-10-CM | POA: Diagnosis present

## 2023-05-07 DIAGNOSIS — I8511 Secondary esophageal varices with bleeding: Secondary | ICD-10-CM | POA: Diagnosis present

## 2023-05-07 DIAGNOSIS — D509 Iron deficiency anemia, unspecified: Secondary | ICD-10-CM | POA: Diagnosis present

## 2023-05-07 DIAGNOSIS — Z79899 Other long term (current) drug therapy: Secondary | ICD-10-CM

## 2023-05-07 DIAGNOSIS — Z886 Allergy status to analgesic agent status: Secondary | ICD-10-CM

## 2023-05-07 DIAGNOSIS — E871 Hypo-osmolality and hyponatremia: Secondary | ICD-10-CM | POA: Diagnosis present

## 2023-05-07 DIAGNOSIS — F32A Depression, unspecified: Secondary | ICD-10-CM | POA: Diagnosis present

## 2023-05-07 DIAGNOSIS — E1142 Type 2 diabetes mellitus with diabetic polyneuropathy: Secondary | ICD-10-CM | POA: Diagnosis present

## 2023-05-07 DIAGNOSIS — N1832 Chronic kidney disease, stage 3b: Secondary | ICD-10-CM | POA: Diagnosis present

## 2023-05-07 DIAGNOSIS — C189 Malignant neoplasm of colon, unspecified: Secondary | ICD-10-CM | POA: Diagnosis present

## 2023-05-07 DIAGNOSIS — F419 Anxiety disorder, unspecified: Secondary | ICD-10-CM | POA: Diagnosis present

## 2023-05-07 DIAGNOSIS — D61818 Other pancytopenia: Secondary | ICD-10-CM | POA: Diagnosis present

## 2023-05-07 DIAGNOSIS — E785 Hyperlipidemia, unspecified: Secondary | ICD-10-CM | POA: Diagnosis present

## 2023-05-07 DIAGNOSIS — R109 Unspecified abdominal pain: Secondary | ICD-10-CM | POA: Diagnosis present

## 2023-05-07 DIAGNOSIS — D6959 Other secondary thrombocytopenia: Secondary | ICD-10-CM | POA: Diagnosis present

## 2023-05-07 DIAGNOSIS — K766 Portal hypertension: Secondary | ICD-10-CM | POA: Diagnosis present

## 2023-05-07 DIAGNOSIS — I480 Paroxysmal atrial fibrillation: Secondary | ICD-10-CM | POA: Diagnosis not present

## 2023-05-07 DIAGNOSIS — I8500 Esophageal varices without bleeding: Secondary | ICD-10-CM | POA: Diagnosis not present

## 2023-05-07 DIAGNOSIS — F418 Other specified anxiety disorders: Secondary | ICD-10-CM | POA: Diagnosis present

## 2023-05-07 DIAGNOSIS — E440 Moderate protein-calorie malnutrition: Secondary | ICD-10-CM | POA: Insufficient documentation

## 2023-05-07 DIAGNOSIS — G621 Alcoholic polyneuropathy: Secondary | ICD-10-CM | POA: Diagnosis present

## 2023-05-07 DIAGNOSIS — D649 Anemia, unspecified: Secondary | ICD-10-CM | POA: Diagnosis present

## 2023-05-07 DIAGNOSIS — Z932 Ileostomy status: Secondary | ICD-10-CM

## 2023-05-07 DIAGNOSIS — N183 Chronic kidney disease, stage 3 unspecified: Secondary | ICD-10-CM | POA: Diagnosis present

## 2023-05-07 DIAGNOSIS — D62 Acute posthemorrhagic anemia: Secondary | ICD-10-CM | POA: Diagnosis present

## 2023-05-07 DIAGNOSIS — Z6820 Body mass index (BMI) 20.0-20.9, adult: Secondary | ICD-10-CM

## 2023-05-07 DIAGNOSIS — E1149 Type 2 diabetes mellitus with other diabetic neurological complication: Secondary | ICD-10-CM | POA: Diagnosis present

## 2023-05-07 DIAGNOSIS — K7031 Alcoholic cirrhosis of liver with ascites: Secondary | ICD-10-CM | POA: Diagnosis present

## 2023-05-07 DIAGNOSIS — E46 Unspecified protein-calorie malnutrition: Secondary | ICD-10-CM | POA: Diagnosis present

## 2023-05-07 DIAGNOSIS — E1122 Type 2 diabetes mellitus with diabetic chronic kidney disease: Secondary | ICD-10-CM | POA: Diagnosis present

## 2023-05-07 DIAGNOSIS — Z87891 Personal history of nicotine dependence: Secondary | ICD-10-CM

## 2023-05-07 DIAGNOSIS — I129 Hypertensive chronic kidney disease with stage 1 through stage 4 chronic kidney disease, or unspecified chronic kidney disease: Secondary | ICD-10-CM | POA: Diagnosis present

## 2023-05-07 DIAGNOSIS — M109 Gout, unspecified: Secondary | ICD-10-CM | POA: Diagnosis present

## 2023-05-07 DIAGNOSIS — I10 Essential (primary) hypertension: Secondary | ICD-10-CM | POA: Diagnosis present

## 2023-05-07 DIAGNOSIS — Z833 Family history of diabetes mellitus: Secondary | ICD-10-CM

## 2023-05-07 DIAGNOSIS — E8770 Fluid overload, unspecified: Secondary | ICD-10-CM | POA: Diagnosis present

## 2023-05-07 DIAGNOSIS — I9589 Other hypotension: Secondary | ICD-10-CM | POA: Diagnosis present

## 2023-05-07 DIAGNOSIS — K219 Gastro-esophageal reflux disease without esophagitis: Secondary | ICD-10-CM | POA: Diagnosis present

## 2023-05-07 DIAGNOSIS — K4090 Unilateral inguinal hernia, without obstruction or gangrene, not specified as recurrent: Secondary | ICD-10-CM | POA: Diagnosis present

## 2023-05-07 DIAGNOSIS — Z8249 Family history of ischemic heart disease and other diseases of the circulatory system: Secondary | ICD-10-CM

## 2023-05-07 DIAGNOSIS — Z9049 Acquired absence of other specified parts of digestive tract: Secondary | ICD-10-CM

## 2023-05-07 HISTORY — DX: Acute pancreatitis without necrosis or infection, unspecified: K85.90

## 2023-05-07 LAB — CBC WITH DIFFERENTIAL/PLATELET
Abs Immature Granulocytes: 0.01 10*3/uL (ref 0.00–0.07)
Basophils Absolute: 0 10*3/uL (ref 0.0–0.1)
Basophils Relative: 1 %
Eosinophils Absolute: 0.1 10*3/uL (ref 0.0–0.5)
Eosinophils Relative: 2 %
HCT: 20.3 % — ABNORMAL LOW (ref 39.0–52.0)
Hemoglobin: 6.6 g/dL — CL (ref 13.0–17.0)
Immature Granulocytes: 0 %
Lymphocytes Relative: 15 %
Lymphs Abs: 0.4 10*3/uL — ABNORMAL LOW (ref 0.7–4.0)
MCH: 33.3 pg (ref 26.0–34.0)
MCHC: 32.5 g/dL (ref 30.0–36.0)
MCV: 102.5 fL — ABNORMAL HIGH (ref 80.0–100.0)
Monocytes Absolute: 0.2 10*3/uL (ref 0.1–1.0)
Monocytes Relative: 7 %
Neutro Abs: 1.8 10*3/uL (ref 1.7–7.7)
Neutrophils Relative %: 75 %
Platelets: 79 10*3/uL — ABNORMAL LOW (ref 150–400)
RBC: 1.98 MIL/uL — ABNORMAL LOW (ref 4.22–5.81)
RDW: 15.4 % (ref 11.5–15.5)
WBC: 2.5 10*3/uL — ABNORMAL LOW (ref 4.0–10.5)
nRBC: 0 % (ref 0.0–0.2)

## 2023-05-07 LAB — COMPREHENSIVE METABOLIC PANEL
ALT: 20 U/L (ref 0–44)
AST: 42 U/L — ABNORMAL HIGH (ref 15–41)
Albumin: 2.5 g/dL — ABNORMAL LOW (ref 3.5–5.0)
Alkaline Phosphatase: 136 U/L — ABNORMAL HIGH (ref 38–126)
Anion gap: 8 (ref 5–15)
BUN: 28 mg/dL — ABNORMAL HIGH (ref 6–20)
CO2: 17 mmol/L — ABNORMAL LOW (ref 22–32)
Calcium: 8.4 mg/dL — ABNORMAL LOW (ref 8.9–10.3)
Chloride: 105 mmol/L (ref 98–111)
Creatinine, Ser: 1.95 mg/dL — ABNORMAL HIGH (ref 0.61–1.24)
GFR, Estimated: 40 mL/min — ABNORMAL LOW (ref >60)
Glucose, Bld: 381 mg/dL — ABNORMAL HIGH (ref 70–99)
Potassium: 3.2 mmol/L — ABNORMAL LOW (ref 3.5–5.1)
Sodium: 130 mmol/L — ABNORMAL LOW (ref 135–145)
Total Bilirubin: 3.6 mg/dL — ABNORMAL HIGH (ref <1.2)
Total Protein: 7.8 g/dL (ref 6.5–8.1)

## 2023-05-07 LAB — LIPASE, BLOOD: Lipase: 374 U/L — ABNORMAL HIGH (ref 11–51)

## 2023-05-07 LAB — PROTIME-INR
INR: 1.7 — ABNORMAL HIGH (ref 0.8–1.2)
Prothrombin Time: 19.9 s — ABNORMAL HIGH (ref 11.4–15.2)

## 2023-05-07 LAB — PREPARE RBC (CROSSMATCH)

## 2023-05-07 LAB — ETHANOL: Alcohol, Ethyl (B): <10 mg/dL (ref <10)

## 2023-05-07 MED ORDER — LACTATED RINGERS IV SOLN: INTRAVENOUS | Status: AC

## 2023-05-07 MED ORDER — ENSURE ENLIVE PO LIQD: 237.0000 mL | Freq: Two times a day (BID) | ORAL | Status: DC

## 2023-05-07 MED ORDER — FERROUS SULFATE 325 (65 FE) MG PO TABS
325.0000 mg | ORAL_TABLET | Freq: Every day | ORAL | Status: DC
  Administered 2023-05-09 – 2023-05-12 (×4): 325 mg via ORAL
  Filled 2023-05-07 (×4): qty 1

## 2023-05-07 MED ORDER — NALOXONE HCL 0.4 MG/ML IJ SOLN: 0.4000 mg | INTRAMUSCULAR | Status: DC | PRN

## 2023-05-07 MED ORDER — SODIUM CHLORIDE 0.9% FLUSH: 9.0000 mL | INTRAVENOUS | Status: DC | PRN

## 2023-05-07 MED ORDER — NYSTATIN 100000 UNIT/GM EX POWD
1.0000 | Freq: Three times a day (TID) | CUTANEOUS | Status: DC
Start: 2023-05-08 — End: 2023-05-12
  Administered 2023-05-10 – 2023-05-11 (×2): 1 via TOPICAL
  Filled 2023-05-07: qty 15

## 2023-05-07 MED ORDER — ONDANSETRON HCL 4 MG/2ML IJ SOLN: 4.0000 mg | Freq: Four times a day (QID) | INTRAMUSCULAR | Status: DC | PRN

## 2023-05-07 MED ORDER — FUROSEMIDE 20 MG PO TABS
20.0000 mg | ORAL_TABLET | Freq: Every day | ORAL | Status: DC
  Administered 2023-05-07 – 2023-05-12 (×6): 20 mg via ORAL
  Filled 2023-05-07 (×6): qty 1

## 2023-05-07 MED ORDER — SODIUM CHLORIDE 0.9% IV SOLUTION: Freq: Once | INTRAVENOUS | Status: AC

## 2023-05-07 MED ORDER — SODIUM CHLORIDE 0.9 % IV SOLN: INTRAVENOUS | Status: AC

## 2023-05-07 MED ORDER — SODIUM CHLORIDE 0.9 % IV SOLN
2.0000 g | INTRAVENOUS | Status: DC
  Administered 2023-05-08 – 2023-05-09 (×3): 2 g via INTRAVENOUS
  Filled 2023-05-07 (×3): qty 20

## 2023-05-07 MED ORDER — HYDROMORPHONE 1 MG/ML IV SOLN
INTRAVENOUS | Status: DC
  Administered 2023-05-08: 0.8 mg via INTRAVENOUS
  Administered 2023-05-08: 0.6 mg via INTRAVENOUS
  Administered 2023-05-08: 30 mg via INTRAVENOUS
  Administered 2023-05-08: 1.4 mg via INTRAVENOUS
  Administered 2023-05-08: 2 mg via INTRAVENOUS
  Filled 2023-05-07: qty 30

## 2023-05-07 MED ORDER — RIFAXIMIN 550 MG PO TABS
550.0000 mg | ORAL_TABLET | Freq: Two times a day (BID) | ORAL | Status: DC
  Administered 2023-05-07 – 2023-05-12 (×10): 550 mg via ORAL
  Filled 2023-05-07 (×10): qty 1

## 2023-05-07 MED ORDER — PANTOPRAZOLE SODIUM 40 MG PO TBEC
40.0000 mg | DELAYED_RELEASE_TABLET | Freq: Every day | ORAL | Status: DC
  Filled 2023-05-07: qty 1

## 2023-05-07 MED ORDER — INSULIN ASPART 100 UNIT/ML IJ SOLN
0.0000 [IU] | Freq: Three times a day (TID) | INTRAMUSCULAR | Status: DC
  Administered 2023-05-08 (×2): 2 [IU] via SUBCUTANEOUS
  Administered 2023-05-08 – 2023-05-09 (×2): 3 [IU] via SUBCUTANEOUS
  Administered 2023-05-10 (×2): 2 [IU] via SUBCUTANEOUS
  Administered 2023-05-10 – 2023-05-12 (×4): 3 [IU] via SUBCUTANEOUS
  Administered 2023-05-12: 2 [IU] via SUBCUTANEOUS
  Filled 2023-05-07: qty 0.15

## 2023-05-07 MED ORDER — FOLIC ACID 1 MG PO TABS
1.0000 mg | ORAL_TABLET | Freq: Every day | ORAL | Status: DC
  Administered 2023-05-07 – 2023-05-12 (×5): 1 mg via ORAL
  Filled 2023-05-07 (×5): qty 1

## 2023-05-07 MED ORDER — LACTATED RINGERS IV BOLUS
1000.0000 mL | Freq: Once | INTRAVENOUS | Status: AC
  Administered 2023-05-07: 1000 mL via INTRAVENOUS

## 2023-05-07 MED ORDER — SERTRALINE HCL 50 MG PO TABS
50.0000 mg | ORAL_TABLET | Freq: Every day | ORAL | Status: DC
  Administered 2023-05-07 – 2023-05-12 (×6): 50 mg via ORAL
  Filled 2023-05-07 (×6): qty 1

## 2023-05-07 MED ORDER — CARVEDILOL 3.125 MG PO TABS
3.1250 mg | ORAL_TABLET | Freq: Two times a day (BID) | ORAL | Status: DC
  Administered 2023-05-08 – 2023-05-11 (×6): 3.125 mg via ORAL
  Filled 2023-05-07 (×6): qty 1

## 2023-05-07 MED ORDER — GABAPENTIN 100 MG PO CAPS
100.0000 mg | ORAL_CAPSULE | Freq: Two times a day (BID) | ORAL | Status: DC
  Administered 2023-05-08 – 2023-05-12 (×8): 100 mg via ORAL
  Filled 2023-05-07 (×8): qty 1

## 2023-05-07 MED ORDER — NICOTINE 7 MG/24HR TD PT24
7.0000 mg | MEDICATED_PATCH | Freq: Every day | TRANSDERMAL | Status: DC
Start: 2023-05-07 — End: 2023-05-12
  Administered 2023-05-07 – 2023-05-12 (×6): 7 mg via TRANSDERMAL
  Filled 2023-05-07 (×6): qty 1

## 2023-05-07 MED ORDER — MORPHINE SULFATE (PF) 4 MG/ML IV SOLN
6.0000 mg | Freq: Once | INTRAVENOUS | Status: AC
  Administered 2023-05-07: 6 mg via INTRAVENOUS
  Filled 2023-05-07: qty 2

## 2023-05-07 MED ORDER — DIPHENHYDRAMINE HCL 12.5 MG/5ML PO ELIX: 12.5000 mg | ORAL_SOLUTION | Freq: Four times a day (QID) | ORAL | Status: DC | PRN

## 2023-05-07 MED ORDER — GABAPENTIN 300 MG PO CAPS
300.0000 mg | ORAL_CAPSULE | Freq: Every day | ORAL | Status: DC
  Administered 2023-05-07 – 2023-05-11 (×5): 300 mg via ORAL
  Filled 2023-05-07 (×5): qty 1

## 2023-05-07 MED ORDER — THIAMINE MONONITRATE 100 MG PO TABS
100.0000 mg | ORAL_TABLET | Freq: Every day | ORAL | Status: DC
  Administered 2023-05-07 – 2023-05-12 (×5): 100 mg via ORAL
  Filled 2023-05-07 (×5): qty 1

## 2023-05-07 MED ORDER — DIPHENHYDRAMINE HCL 50 MG/ML IJ SOLN: 12.5000 mg | Freq: Four times a day (QID) | INTRAMUSCULAR | Status: DC | PRN

## 2023-05-07 MED ORDER — SPIRONOLACTONE 25 MG PO TABS
25.0000 mg | ORAL_TABLET | Freq: Every day | ORAL | Status: DC
  Administered 2023-05-08: 25 mg via ORAL
  Filled 2023-05-07: qty 1

## 2023-05-07 MED ORDER — MIDODRINE HCL 5 MG PO TABS
2.5000 mg | ORAL_TABLET | Freq: Three times a day (TID) | ORAL | Status: DC
  Administered 2023-05-08 – 2023-05-11 (×9): 2.5 mg via ORAL
  Filled 2023-05-07 (×9): qty 1

## 2023-05-07 NOTE — Assessment & Plan Note (Signed)
No GERD symptoms except for abdmominal pain. No large volume UGI bleed  Plan PPI PO

## 2023-05-07 NOTE — Assessment & Plan Note (Signed)
Patient with CKD III. Now with bump in serum creatinine. May be prerenal with decreased intake 2/2 abdominal pain.  Plan IV hydration  F/u Bmet

## 2023-05-07 NOTE — Assessment & Plan Note (Signed)
Chronic problem. He has not seen hematologist in [place]. Question whether he has hematologist in [place].  Plan Outpatient management and monitoring

## 2023-05-07 NOTE — ED Notes (Signed)
..ED TO INPATIENT HANDOFF REPORT  Name/Age/Gender Martin Anthony 57 y.o. male  Code Status    Code Status Orders  (From admission, onward)           Start     Ordered   05/07/23 2242  Full code  Continuous       Question:  By:  Answer:  Consent: discussion documented in EHR   05/07/23 2248           Code Status History     Date Active Date Inactive Code Status Order ID Comments User Context   03/23/2023 2144 04/08/2023 1637 Full Code [id number]  Briscoe Deutscher, MD ED   05/12/2021 0512 05/21/2021 2009 Full Code [id number]  Eduard Clos, MD ED   01/18/2019 0503 01/22/2019 1639 Full Code [id number]  Eduard Clos, MD ED       Home/SNF/Other Home  Chief Complaint Abdominal pain in male [R10.9]  Level of Care/Admitting Diagnosis ED Disposition     ED Disposition  Admit   Condition  --   Comment  Hospital Area: [facility] [[id number]]  Level of Care: Telemetry [5]  Admit to tele based on following criteria: Other see comments  Comments: PAF, anemia, GI bleed  May admit patient to [facility] or [facility] if equivalent level of care is available:: Yes  Covid Evaluation: Asymptomatic - no recent exposure (last 10 days) testing not required  Diagnosis: Abdominal pain in male [3474259]  Admitting Physician: Jacques Navy [[id number]]  Attending Physician: Jacques Navy [[id number]]  Certification:: I certify this patient will need inpatient services for at least 2 midnights  Expected Medical Readiness: 05/11/2023          Medical History Past Medical History:  Diagnosis Date   Anxiety    Ascites due to alcoholic cirrhosis (HCC)    Cirrhosis with alcoholism (HCC)    Depression    Diabetes mellitus without complication (HCC)    GERD (gastroesophageal reflux disease)    Gout    Hyperlipidemia    Hypertension    Pancreatitis    Primary adenocarcinoma of ascending colon (HCC)    Substance abuse (HCC)    alcoholism    Type 2  diabetes mellitus with diabetic neuropathy (HCC)     Allergies Allergies  Allergen Reactions   Acetaminophen Other (See Comments)    States he was told not to take it- Contraindicated for condition regarding the liver   Ibuprofen Other (See Comments)    Liver sensitivity- Contraindicated for condition regarding the liver  MD told the patient he may take this in very low doses in 04/2023    IV Location/Drains/Wounds Patient Lines/Drains/Airways Status     Active Line/Drains/Airways     Name Placement date Placement time Site Days   Peripheral IV 03/23/23 18 G Anterior;Right Forearm 03/23/23  1929  Forearm  45   Peripheral IV 03/28/23 20 G 1.75" Anterior;Proximal;Right Forearm 03/28/23  1800  Forearm  40   Peripheral IV 05/07/23 Anterior;Distal;Left;Upper Arm 05/07/23  2015  Arm  less than 1   Peripheral IV 05/07/23 20 G Posterior;Right Forearm 05/07/23  2150  Forearm  less than 1   Ileostomy RUQ --  --  RUQ  --   Wound / Incision (Open or Dehisced) 03/27/23 Skin tear Thigh Distal;Left;Posterior 03/27/23  0615  Thigh  41            Labs/Imaging Results for orders placed or performed during the hospital encounter of  05/07/23 (from the past 48 hour(s))  CBC with Differential     Status: Abnormal   Collection Time: 05/07/23  6:52 PM  Result Value Ref Range   WBC 2.5 (L) 4.0 - 10.5 K/uL   RBC 1.98 (L) 4.22 - 5.81 MIL/uL   Hemoglobin 6.6 (LL) 13.0 - 17.0 g/dL    Comment: This critical result has verified and been called to [facility], RN by Kathlene November on 11 22 2024 at 1950, and has been read back.    HCT 20.3 (L) 39.0 - 52.0 %   MCV 102.5 (H) 80.0 - 100.0 fL   MCH 33.3 26.0 - 34.0 pg   MCHC 32.5 30.0 - 36.0 g/dL   RDW 29.5 62.1 - 30.8 %   Platelets 79 (L) 150 - 400 K/uL    Comment: Immature Platelet Fraction may be clinically indicated, consider ordering this additional test [id number]    nRBC 0.0 0.0 - 0.2 %   Neutrophils Relative % 75 %   Neutro Abs 1.8 1.7 - 7.7  K/uL   Lymphocytes Relative 15 %   Lymphs Abs 0.4 (L) 0.7 - 4.0 K/uL   Monocytes Relative 7 %   Monocytes Absolute 0.2 0.1 - 1.0 K/uL   Eosinophils Relative 2 %   Eosinophils Absolute 0.1 0.0 - 0.5 K/uL   Basophils Relative 1 %   Basophils Absolute 0.0 0.0 - 0.1 K/uL   Immature Granulocytes 0 %   Abs Immature Granulocytes 0.01 0.00 - 0.07 K/uL    Comment: Performed at [facility], [address] [place], [place] [address]  Comprehensive metabolic panel     Status: Abnormal   Collection Time: 05/07/23  6:52 PM  Result Value Ref Range   Sodium 130 (L) 135 - 145 mmol/L   Potassium 3.2 (L) 3.5 - 5.1 mmol/L   Chloride 105 98 - 111 mmol/L   CO2 17 (L) 22 - 32 mmol/L   Glucose, Bld 381 (H) 70 - 99 mg/dL    Comment: Glucose reference range applies only to samples taken after fasting for at least 8 hours.   BUN 28 (H) 6 - 20 mg/dL   Creatinine, Ser 4.13 (H) 0.61 - 1.24 mg/dL   Calcium 8.4 (L) 8.9 - 10.3 mg/dL   Total Protein 7.8 6.5 - 8.1 g/dL   Albumin 2.5 (L) 3.5 - 5.0 g/dL   AST 42 (H) 15 - 41 U/L   ALT 20 0 - 44 U/L   Alkaline Phosphatase 136 (H) 38 - 126 U/L   Total Bilirubin 3.6 (H) <1.2 mg/dL   GFR, Estimated 40 (L) >60 mL/min    Comment: (NOTE) Calculated using the CKD-EPI Creatinine Equation (2021)    Anion gap 8 5 - 15    Comment: Performed at [facility], [address], [place], [place] [address]  Lipase, blood     Status: Abnormal   Collection Time: 05/07/23  6:52 PM  Result Value Ref Range   Lipase 374 (H) 11 - 51 U/L    Comment: Performed at [facility], [address] [place], [place] [address]  Type and screen [facility]     Status: None (Preliminary result)   Collection Time: 05/07/23  6:52 PM  Result Value Ref Range   ABO/RH(D) O POS    Antibody Screen NEG    Sample Expiration 05/10/2023,2359    Unit Number [id number]    Blood Component Type RBC LR PHER1  Unit division 00     Status of Unit ALLOCATED    Transfusion Status OK TO TRANSFUSE    Crossmatch Result Compatible    Unit Number [id number]    Blood Component Type RBC LR PHER2    Unit division 00    Status of Unit ISSUED    Transfusion Status OK TO TRANSFUSE    Crossmatch Result      Compatible Performed at [facility], [address], [place], [place] [address]   Protime-INR     Status: Abnormal   Collection Time: 05/07/23  6:52 PM  Result Value Ref Range   Prothrombin Time 19.9 (H) 11.4 - 15.2 seconds   INR 1.7 (H) 0.8 - 1.2    Comment: (NOTE) INR goal varies based on device and disease states. Performed at [facility], [address], [place], [place] [address]   Prepare RBC (crossmatch)     Status: None   Collection Time: 05/07/23  8:30 PM  Result Value Ref Range   Order Confirmation      ORDER PROCESSED BY BLOOD BANK Performed at [facility], [address] [place], [place] [address]   Ethanol     Status: None   Collection Time: 05/07/23  9:28 PM  Result Value Ref Range   Alcohol, Ethyl (B) <10 <10 mg/dL    Comment: (NOTE) Lowest detectable limit for serum alcohol is 10 mg/dL.  For medical purposes only. Performed at [facility], [address], [place], [place] [address]    No results found.  Pending Labs Unresulted Labs (From admission, onward)     Start     Ordered   05/08/23 0500  Basic metabolic panel  Tomorrow morning,   R        05/07/23 2248   05/08/23 0500  Lipase, blood  Tomorrow morning,   R        05/07/23 2248   05/07/23 2245  Ammonia  Add-on,   AD        05/07/23 2248            Vitals/Pain Today's Vitals   05/07/23 2150 05/07/23 2152 05/07/23 2215 05/07/23 2230  BP:  (!) 141/70 (!) 147/79 (!) 145/75  Pulse:  62 64 64  Resp:  13 17 18   Temp:  98 F (36.7 C)    TempSrc:  Oral    SpO2:  100% 100% 100%  Weight:      PainSc: 6        Isolation Precautions No  active isolations  Medications Medications  lactated ringers infusion ( Intravenous New Bag/Given 05/07/23 2033)  rifaximin (XIFAXAN) tablet 550 mg (550 mg Oral Given 05/07/23 2246)  carvedilol (COREG) tablet 3.125 mg (has no administration in time range)  furosemide (LASIX) tablet 20 mg (20 mg Oral Given 05/07/23 2231)  midodrine (PROAMATINE) tablet 2.5 mg (has no administration in time range)  spironolactone (ALDACTONE) tablet 25 mg (has no administration in time range)  sertraline (ZOLOFT) tablet 50 mg (50 mg Oral Given 05/07/23 2231)  pantoprazole (PROTONIX) EC tablet 40 mg (has no administration in time range)  folic acid (FOLVITE) tablet 1 mg (1 mg Oral Given 05/07/23 2231)  ferrous sulfate tablet 325 mg (has no administration in time range)  gabapentin (NEURONTIN) capsule 100 mg (has no administration in time range)  gabapentin (NEURONTIN) capsule 300 mg (300 mg Oral Given 05/07/23 2236)  thiamine (VITAMIN B1) tablet 100 mg (100 mg Oral Given  05/07/23 2231)  nystatin (MYCOSTATIN/NYSTOP) topical powder 1 Application (has no administration in time range)  nicotine (NICODERM CQ - dosed in mg/24 hr) patch 7 mg (7 mg Transdermal Patch Applied 05/07/23 2231)  0.9 %  sodium chloride infusion (has no administration in time range)  naloxone [facility]) injection 0.4 mg (has no administration in time range)    And  sodium chloride flush (NS) 0.9 % injection 9 mL (has no administration in time range)  ondansetron (ZOFRAN) injection 4 mg (has no administration in time range)  diphenhydrAMINE (BENADRYL) injection 12.5 mg (has no administration in time range)    Or  diphenhydrAMINE (BENADRYL) 12.5 MG/5ML elixir 12.5 mg (has no administration in time range)  insulin aspart (novoLOG) injection 0-15 Units (has no administration in time range)  HYDROmorphone (DILAUDID) 1 mg/mL PCA injection (has no administration in time range)  lactated ringers bolus 1,000 mL (1,000 mLs Intravenous New Bag/Given  05/07/23 2031)  0.9 %  sodium chloride infusion (Manually program via Guardrails IV Fluids) ( Intravenous New Bag/Given 05/07/23 2139)  morphine (PF) 4 MG/ML injection 6 mg (6 mg Intravenous Given 05/07/23 2120)    Mobility walks

## 2023-05-07 NOTE — ED Provider Notes (Signed)
[place] EMERGENCY DEPARTMENT AT [facility] Provider Note   CSN: [id number] Arrival date & time: 05/07/23  1809     History  Chief Complaint  Patient presents with   abnormal labs    Martin Anthony is a 57 y.o. male.  57 year old male presents with low hemoglobin.  Patient has doctor 3 days ago due to a 1 week history of abdominal discomfort.  Does have a history of esophageal varices.  Patient also has status post ileostomy secondary to colon cancer.  Denies any blood in his bag.  Denies any hematemesis currently.  Has a history of alcohol abuse but denies any current use.  Was hospitalized about a month ago for similar symptoms.  Had esophageal varices banded along with treated for alcohol delirium.  Denies any alcohol use currently.  At this time.  Was called today due to hemoglobin being 6.4 and told to come here.       Home Medications Prior to Admission medications   Medication Sig Start Date End Date Taking? Authorizing Provider  folic acid (FOLVITE) 1 MG tablet Take 1 tablet (1 mg total) by mouth daily. 05/21/21   Leroy Sea, MD  furosemide (LASIX) 20 MG tablet Take 1 tablet (20 mg total) by mouth daily. 04/08/23   Hughie Closs, MD  gabapentin (NEURONTIN) 100 MG capsule Take 1 capsule (100 mg total) by mouth 3 (three) times daily. 04/08/23 05/08/23  Hughie Closs, MD  Iron, Ferrous Sulfate, 325 (65 Fe) MG TABS Take 325 mg by mouth daily. 12/18/21   Nelwyn Salisbury, MD  midodrine (PROAMATINE) 2.5 MG tablet Take 1 tablet (2.5 mg total) by mouth 3 (three) times daily with meals. 04/08/23 05/08/23  Hughie Closs, MD  nadolol (CORGARD) 20 MG tablet Take 1 tablet (20 mg total) by mouth daily. 12/05/21   Nelwyn Salisbury, MD  nicotine (NICODERM CQ - DOSED IN MG/24 HOURS) 14 mg/24hr patch Place 1 patch (14 mg total) onto the skin daily. 04/08/23 04/07/24  Hughie Closs, MD  nystatin (MYCOSTATIN/NYSTOP) powder Apply 1 Application topically 3 (three) times daily. 05/03/23    Nelwyn Salisbury, MD  pantoprazole (PROTONIX) 40 MG tablet Take 1 tablet (40 mg total) by mouth daily. 05/21/21   Leroy Sea, MD  rifaximin (XIFAXAN) 550 MG TABS tablet Take 1 tablet (550 mg total) by mouth 2 (two) times daily. 01/20/22   Nelwyn Salisbury, MD  sertraline (ZOLOFT) 50 MG tablet Take 1 tablet (50 mg total) by mouth daily. 02/02/23   Nelwyn Salisbury, MD  spironolactone (ALDACTONE) 25 MG tablet Take 0.5 tablets (12.5 mg total) by mouth daily. 04/08/23 05/08/23  Hughie Closs, MD  thiamine 100 MG tablet Take 1 tablet (100 mg total) by mouth daily. 05/21/21   Leroy Sea, MD      Allergies    Acetaminophen and Ibuprofen    Review of Systems   Review of Systems  All other systems reviewed and are negative.   Physical Exam Updated Vital Signs BP (!) 150/70 (BP Location: Left Arm)   Pulse 67   Temp 98.1 F (36.7 C) (Oral)   Resp 18   Wt 78 kg   SpO2 100%   BMI 20.93 kg/m  Physical Exam Vitals and nursing note reviewed.  Constitutional:      General: He is not in acute distress.    Appearance: Normal appearance. He is well-developed. He is not toxic-appearing.  HENT:     Head: Normocephalic and atraumatic.  Eyes:     General: Lids are normal. Scleral icterus present.     Conjunctiva/sclera: Conjunctivae normal.     Pupils: Pupils are equal, round, and reactive to light.  Neck:     Thyroid: No thyroid mass.     Trachea: No tracheal deviation.  Cardiovascular:     Rate and Rhythm: Normal rate and regular rhythm.     Heart sounds: Normal heart sounds. No murmur heard.    No gallop.  Pulmonary:     Effort: Pulmonary effort is normal. No respiratory distress.     Breath sounds: Normal breath sounds. No stridor. No decreased breath sounds, wheezing, rhonchi or rales.  Abdominal:     General: There is no distension.     Palpations: Abdomen is soft.     Tenderness: There is abdominal tenderness in the epigastric area. There is no rebound.  Musculoskeletal:         General: No tenderness. Normal range of motion.     Cervical back: Normal range of motion and neck supple.  Skin:    General: Skin is warm and dry.     Findings: No abrasion or rash.  Neurological:     Mental Status: He is alert and oriented to person, place, and time. Mental status is at baseline.     GCS: GCS eye subscore is 4. GCS verbal subscore is 5. GCS motor subscore is 6.     Cranial Nerves: Cranial nerves are intact. No cranial nerve deficit.     Sensory: No sensory deficit.     Motor: Motor function is intact.  Psychiatric:        Attention and Perception: Attention normal.        Speech: Speech normal.        Behavior: Behavior normal.     ED Results / Procedures / Treatments   Labs (all labs ordered are listed, but only abnormal results are displayed) Labs Reviewed  CBC WITH DIFFERENTIAL/PLATELET - Abnormal; Notable for the following components:      Result Value   WBC 2.5 (*)    RBC 1.98 (*)    Hemoglobin 6.6 (*)    HCT 20.3 (*)    MCV 102.5 (*)    Platelets 79 (*)    Lymphs Abs 0.4 (*)    All other components within normal limits  COMPREHENSIVE METABOLIC PANEL - Abnormal; Notable for the following components:   Sodium 130 (*)    Potassium 3.2 (*)    CO2 17 (*)    Glucose, Bld 381 (*)    BUN 28 (*)    Creatinine, Ser 1.95 (*)    Calcium 8.4 (*)    Albumin 2.5 (*)    AST 42 (*)    Alkaline Phosphatase 136 (*)    Total Bilirubin 3.6 (*)    GFR, Estimated 40 (*)    All other components within normal limits  LIPASE, BLOOD - Abnormal; Notable for the following components:   Lipase 374 (*)    All other components within normal limits  PROTIME-INR - Abnormal; Notable for the following components:   Prothrombin Time 19.9 (*)    INR 1.7 (*)    All other components within normal limits  POC OCCULT BLOOD, ED  TYPE AND SCREEN  PREPARE RBC (CROSSMATCH)    EKG None  Radiology No results found.  Procedures Procedures    Medications Ordered in  ED Medications  lactated ringers bolus 1,000 mL (has no administration in time range)  lactated ringers infusion (has no administration in time range)  0.9 %  sodium chloride infusion (Manually program via Guardrails IV Fluids) (has no administration in time range)    ED Course/ Medical Decision Making/ A&P                                 Medical Decision Making Amount and/or Complexity of Data Reviewed Labs: ordered.  Risk Prescription drug management.   Patient's hemoglobin here is 6.6.  2 units of blood ordered.  Patient's renal function shows chronic kidney disease.  Lipase is elevated at 374.  Some element of pancreatitis therefore suspected.  He has no overt active GI bleeding at this time.  Mild thrombocytopenia noted.  Plan will be for hospitalization.  Will consult hospitalist team        Final Clinical Impression(s) / ED Diagnoses Final diagnoses:  None    Rx / DC Orders ED Discharge Orders     None         Lorre Nick, MD 05/07/23 1956

## 2023-05-07 NOTE — Assessment & Plan Note (Signed)
Long term stable problem  Plan Continue home regimen.

## 2023-05-07 NOTE — H&P (Signed)
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 HTN, GERD, CKD 3, peripheral neuropathy. His last admission 03/23/23 related to major GI bleed secondary to variceal bleed with EGD 03/29/23 with banding, repeat bleed with shock requiring ICU admission complicated by encephalopathy, strepticoccal bacteremia with negative TEE for endocarditis requiring two weeks of oral abx. He was discharged home in stable conditio with a Hgb 8.0, nl NH3, cleared mental status, baseline pancytopenia.   Since discharge 03/31/23 he has seen his PCP Martin Anthony and has seen general surgery for a reducible abdominal wall hernia. Starting approximately 7 days ago he has upper abdominal pain described as a ball in his stomach that is much worse after eating. He rates the pain as 10/10 but denies radiation to the back, N/V. His ileostomy continued to function. He takes iron daily and does report having dark stool. As an outpatient CBC revealed Hgb of 8.0 on 04/07/23, then 6.4 on 11/18. Due to his severe abdominal pain and drop in Hgb he was referred to [facility] for evaluation.   ED Course: T 98.1  150/70  HR 67  RR 18. Jaundiced man in no acute distress. Lab: K 3.2, CO2 17, Cr 1.95, Glucose 381, Anion Gap 8, Alk Phos 136, albumin 2.5, Lipase 374, AST 42, WBC 2.4, Hgb 6.4, Plt 83, diff - 75/15/7, INR 1.7. Patient with acute abdominal pain with differential including pancreatitis, SBP, hernia. Complex medical history. By patient report PAF not previously listed in medical  record. In ED he was given Morphine doses for pain control, IV fluids and 1u PRBCs ordered and started. TRH called to admit for continued diagnositc evaluation and treatment   Review of Systems:  Review of Systems  Constitutional:  Positive for weight loss. Negative for chills and fever.       15 lb weight loss over 3 weeks  HENT: Negative.    Eyes: Negative.   Respiratory:  Negative for cough, sputum production and shortness of breath.   Cardiovascular:  Positive for palpitations. Negative for chest pain, orthopnea and leg swelling.  Gastrointestinal:  Positive for abdominal pain, heartburn and melena. Negative for diarrhea, nausea and vomiting.  Genitourinary: Negative.   Musculoskeletal: Negative.   Skin: Negative.   Neurological:  Positive for dizziness and weakness.  Endo/Heme/Allergies: Negative.   Psychiatric/Behavioral: Negative.      Past Medical History:  Diagnosis Date   Anxiety    Ascites due to alcoholic cirrhosis (HCC)    Cirrhosis with alcoholism (HCC)    Depression    Diabetes mellitus without complication (HCC)    GERD (gastroesophageal reflux disease)    Gout    Hyperlipidemia    Hypertension    Pancreatitis    Primary adenocarcinoma of ascending colon (HCC)    Substance abuse (HCC)    alcoholism    Type 2 diabetes mellitus with diabetic neuropathy [facility])     Past Surgical History:  Procedure Laterality Date   BIOPSY  01/19/2019   Procedure: BIOPSY;  Surgeon: Jeani Hawking, MD;  Location: [facility];  Service: Endoscopy;;  COLON SURGERY  10/25/2021   right hemicolectomy with ostomy at [facility] in [place]   COLONOSCOPY N/A 01/19/2019   Procedure: COLONOSCOPY;  Surgeon: Jeani Hawking, MD;  [facility];  Service: Endoscopy;  Laterality: N/A;   ESOPHAGEAL BANDING  03/25/2023   Procedure: ESOPHAGEAL BANDING;  Surgeon: Kerin Salen, MD;  [facility]: [facility];  Service: Gastroenterology;;   ESOPHAGOGASTRODUODENOSCOPY (EGD) WITH PROPOFOL N/A  01/19/2019   Procedure: ESOPHAGOGASTRODUODENOSCOPY (EGD) WITH PROPOFOL;  Surgeon: Jeani Hawking, MD;  [facility];  Service: Endoscopy;  Laterality: N/A;   ESOPHAGOGASTRODUODENOSCOPY (EGD) WITH PROPOFOL N/A 03/25/2023   Procedure: ESOPHAGOGASTRODUODENOSCOPY (EGD) WITH PROPOFOL;  Surgeon: Kerin Salen, MD;  [facility];  Service: Gastroenterology;  Laterality: N/A;   HEMOSTASIS CLIP PLACEMENT  01/19/2019   Procedure: HEMOSTASIS CLIP PLACEMENT;  Surgeon: Jeani Hawking, MD;  [facility];  Service: Endoscopy;;   HERNIA REPAIR     umbilical    ILEOSCOPY N/A 03/25/2023   Procedure: ILEOSCOPY THROUGH STOMA;  Surgeon: Kerin Salen, MD;  [facility];  Service: Gastroenterology;  Laterality: N/A;   INGUINAL HERNIA REPAIR Bilateral    IR FLUORO GUIDE CV LINE RIGHT  05/13/2021   IR PARACENTESIS  05/19/2021   IR US GUIDE VASC ACCESS RIGHT  05/13/2021   POLYPECTOMY  01/19/2019   Procedure: POLYPECTOMY;  Surgeon: Jeani Hawking, MD;  [facility];  Service: Endoscopy;;    Soc Hx - married and divorced twice. 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 Lives indepentently   reports that he quit smoking about 21 years ago. His smoking use included cigarettes. He has never used smokeless tobacco. He reports current alcohol use of about 2.0 standard drinks of alcohol per week. He reports that he does not use drugs.  Allergies  Allergen Reactions   Acetaminophen Other (See Comments)    States he was told not to take it- Contraindicated for condition regarding the liver   Ibuprofen Other (See Comments)    Liver sensitivity- Contraindicated for condition regarding the liver  MD told the patient he may take this in very low doses in 04/2023    Family History  Problem Relation Age of Onset   Hypertension Mother    Diabetes Father    Heart attack Father    Pneumonia Father    Hypertension Father    Diabetes Maternal  Grandfather     Prior to Admission medications   Medication Sig Start Date End Date Taking? Authorizing Provider  carvedilol (COREG) 3.125 MG tablet Take 3.125 mg by mouth 2 (two) times daily with a meal.   Yes [provider]  folic acid (FOLVITE) 1 MG tablet Take 1 tablet (1 mg total) by mouth daily. 05/21/21  Yes Leroy Sea, MD  furosemide (LASIX) 20 MG tablet Take 1 tablet (20 mg total) by mouth daily. 04/08/23  Yes Pahwani, Daleen Bo, MD  gabapentin (NEURONTIN) 100 MG capsule Take 1 capsule (100 mg total) by mouth 3 (three) times daily. Patient taking differently: Take 100 mg by mouth 2 (two) times daily. 04/08/23 05/08/23 Yes Pahwani, Daleen Bo, MD  gabapentin (NEURONTIN) 300 MG capsule Take 300 mg by mouth at bedtime.   Yes [provider]  Iron, Ferrous Sulfate, 325 (65 Fe) MG TABS Take 325 mg by mouth daily. Patient taking differently: Take 325 mg by mouth daily with breakfast. 12/18/21  Yes Martin Salisbury, MD  midodrine (PROAMATINE) 2.5 MG tablet Take 1 tablet (2.5 mg total) by mouth 3 (three)  times daily with meals. Patient taking differently: Take 2.5 mg by mouth in the morning and at bedtime. 04/08/23 05/08/23 Yes Pahwani, Daleen Bo, MD  nicotine (NICODERM CQ - DOSED IN MG/24 HOURS) 14 mg/24hr patch Place 1 patch (14 mg total) onto the skin daily. Patient taking differently: Place 7 mg onto the skin daily. 04/08/23 04/07/24 Yes Pahwani, Daleen Bo, MD  nystatin (MYCOSTATIN/NYSTOP) powder Apply 1 Application topically 3 (three) times daily. Patient taking differently: Apply 1 Application topically See admin instructions. Apply to affected area three times a day 05/03/23  Yes Martin Salisbury, MD  pantoprazole (PROTONIX) 40 MG tablet Take 1 tablet (40 mg total) by mouth daily. Patient taking differently: Take 40 mg by mouth daily before breakfast. 05/21/21  Yes Leroy Sea, MD  rifaximin (XIFAXAN) 550 MG TABS tablet Take 1 tablet (550 mg total) by mouth 2 (two) times daily. 01/20/22   Yes Martin Salisbury, MD  sertraline (ZOLOFT) 50 MG tablet Take 1 tablet (50 mg total) by mouth daily. 02/02/23  Yes Martin Salisbury, MD  spironolactone (ALDACTONE) 25 MG tablet Take 0.5 tablets (12.5 mg total) by mouth daily. Patient taking differently: Take 25 mg by mouth in the morning. 04/08/23 05/08/23 Yes Pahwani, Daleen Bo, MD  thiamine 100 MG tablet Take 1 tablet (100 mg total) by mouth daily. 05/21/21  Yes Leroy Sea, MD  nadolol (CORGARD) 20 MG tablet Take 1 tablet (20 mg total) by mouth daily. Patient not taking: Reported on 05/07/2023 12/05/21   Martin Salisbury, MD    Physical Exam: Vitals:   05/07/23 2137 05/07/23 2152 05/07/23 2215 05/07/23 2230  BP: (!) 144/82 (!) 141/70 (!) 147/79 (!) 145/75  Pulse: 67 62 64 64  Resp: 12 13 17 18   Temp: 97.9 F (36.6 C) 98 F (36.7 C)    TempSrc: Oral Oral    SpO2:  100% 100% 100%  Weight:        Physical Exam Vitals and nursing note reviewed.  Constitutional:      General: He is not in acute distress.    Appearance: He is normal weight. He is ill-appearing.     Comments: jaundiced  HENT:     Head: Normocephalic and atraumatic.     Mouth/Throat:     Mouth: Mucous membranes are dry.     Pharynx: Oropharynx is clear. No oropharyngeal exudate or posterior oropharyngeal erythema.  Eyes:     General: Scleral icterus present.     Extraocular Movements: Extraocular movements intact.     Pupils: Pupils are equal, round, and reactive to light.  Cardiovascular:     Rate and Rhythm: Normal rate and regular rhythm.     Pulses: Normal pulses.     Heart sounds: Normal heart sounds.  Pulmonary:     Effort: Pulmonary effort is normal.     Breath sounds: Normal breath sounds. No wheezing or rales.  Abdominal:     General: There is no distension.     Palpations: There is no mass.     Tenderness: There is abdominal tenderness. There is rebound. There is no guarding.     Hernia: A hernia is present.     Comments: Binder over ileostomy in  place. BS positive. Right lower abdomin with reducible hernia - tender to palpation. Diffuse lower abdominal and peri-ileostomy tenderness with lower abdominal rebound tenderness. Not guarding. Marked epigastric tenderness to deep palpation without radiation to back. No palpable mass, no HSM  Musculoskeletal:  General: Normal range of motion.     Cervical back: Normal range of motion and neck supple.     Right lower leg: No edema.     Left lower leg: No edema.  Skin:    General: Skin is warm and dry.     Coloration: Skin is jaundiced.  Neurological:     General: No focal deficit present.     Mental Status: He is alert and oriented to person, place, and time.  Psychiatric:        Mood and Affect: Mood normal.        Behavior: Behavior normal.        Thought Content: Thought content normal.        Judgment: Judgment normal.      Labs on Admission: I have personally reviewed following labs and imaging studies  CBC: Recent Labs  Lab 05/03/23 1559 05/07/23 1852  WBC 2.9* 2.5*  NEUTROABS 2.2 1.8  HGB 6.4 Repeated and verified X2.* 6.6*  HCT 19.1 Repeated and verified X2.* 20.3*  MCV 100.8* 102.5*  PLT 83.0* 79*   Basic Metabolic Panel: Recent Labs  Lab 05/03/23 1559 05/07/23 1852  NA 136 130*  K 4.1 3.2*  CL 107 105  CO2 20 17*  GLUCOSE 249* 381*  BUN 25* 28*  CREATININE 2.14* 1.95*  CALCIUM 8.5 8.4*   GFR: Estimated Creatinine Clearance: 46.7 mL/min (A) (by C-G formula based on SCr of 1.95 mg/dL (H)). Liver Function Tests: Recent Labs  Lab 05/07/23 1852  AST 42*  ALT 20  ALKPHOS 136*  BILITOT 3.6*  PROT 7.8  ALBUMIN 2.5*   Recent Labs  Lab 05/07/23 1852  LIPASE 374*   No results for input(s): "AMMONIA" in the last 168 hours. Coagulation Profile: Recent Labs  Lab 05/07/23 1852  INR 1.7*   Cardiac Enzymes: No results for input(s): "CKTOTAL", "CKMB", "CKMBINDEX", "TROPONINI" in the last 168 hours. BNP (last 3 results) No results for input(s):  "PROBNP" in the last 8760 hours. HbA1C: No results for input(s): "HGBA1C" in the last 72 hours. CBG: No results for input(s): "GLUCAP" in the last 168 hours. Lipid Profile: No results for input(s): "CHOL", "HDL", "LDLCALC", "TRIG", "CHOLHDL", "LDLDIRECT" in the last 72 hours. Thyroid Function Tests: No results for input(s): "TSH", "T4TOTAL", "FREET4", "T3FREE", "THYROIDAB" in the last 72 hours. Anemia Panel: No results for input(s): "VITAMINB12", "FOLATE", "FERRITIN", "TIBC", "IRON", "RETICCTPCT" in the last 72 hours. Urine analysis:    Component Value Date/Time   COLORURINE YELLOW 01/20/2023 0422   APPEARANCEUR CLEAR 01/20/2023 0422   LABSPEC 1.010 01/20/2023 0422   PHURINE 5.5 01/20/2023 0422   GLUCOSEU NEGATIVE 01/20/2023 0422   HGBUR SMALL (A) 01/20/2023 0422   BILIRUBINUR NEGATIVE 01/20/2023 0422   BILIRUBINUR 2+ 05/17/2015 1013   KETONESUR NEGATIVE 01/20/2023 0422   PROTEINUR NEGATIVE 01/20/2023 0422   UROBILINOGEN 0.2 05/17/2015 1013   NITRITE NEGATIVE 01/20/2023 0422   LEUKOCYTESUR NEGATIVE 01/20/2023 0422    Radiological Exams on Admission: I have personally reviewed images No results found.  EKG: I have personally reviewed EKG: Sinus rhythm, old inferior injury, no acute changes  Assessment/Plan Principal Problem:   Abdominal pain in male Active Problems:   Protein calorie malnutrition (HCC)   PAF (paroxysmal atrial fibrillation) (HCC)   Alcoholic cirrhosis of liver with ascites (HCC)   Depression with anxiety   Essential hypertension   GERD   Anemia   Type 2 diabetes mellitus with neurological complications (HCC)   CKD (chronic kidney disease) stage  3, GFR 30-59 ml/min (HCC)   Pancytopenia (HCC)   Alcoholic peripheral neuropathy (HCC)   Abdominal pain    Assessment and Plan: Protein calorie malnutrition (HCC) Patient with 15lb weight loss over 3 weeks. Poor appetite to due pain with eating. Does tolerate ensure with only modest pain. Albumin  2.5  Plan Prealbumin  Ensure plus 1 can bid  RD consult  PAF (paroxysmal atrial fibrillation) (HCC) Patient reports intermittent irregular heart rate and has been told he has PAF. Normal Heart rate at admission and on last EKG 03/23/23  Plan EKG  Tele admit  Not a candidate for anticoagulation.   Alcoholic cirrhosis of liver with ascites [facility]) Patient with advanced cirrhosis. He stopped drinking in October. Has portal hypertension and ascites.  Plan Continue xifaxam  Continue furosemide and sprinolactone  Abdominal pain Patient with a week of severe abdominal pain. Lab reveals Lipase of 374, AST 42. CTA abdomen 03/23/23  imaging revealed moderate ascites, no gallbladder disease, splenomegaly and varices. Paracentesis 04/05/23 removed 2.5 L serosanquinous fluid - no specimens sent to lab. On exam he has upper abdominal tenderness with rebound, no rigidity, marked tenderness in the upper epigastrum above his ileostomy. There is no fever. WBC 2.9, differential 75/15/7 in a setting of chronic pancytopenia. Differential includes alcohol pancreatitis, SBP, other.  Plan Abdominal U/S for evaluation gallbladder, for ascites and if adequate fluid present diagnostic paracentesis  Ceftriaxone 1g q 24  PCA dilaudid for pain control  IV hydration - 1/2 NS  Full liquid diet  F/u lab: lipase  GI consult    Alcoholic peripheral neuropathy (HCC) Long term stable problem  Plan Continue home regimen.   Pancytopenia (HCC) Chronic problem. He has not seen hematologist in [place]. Question whether he has hematologist in [place].  Plan Outpatient management and monitoring  CKD (chronic kidney disease) stage 3, GFR 30-59 ml/min (HCC) Patient with CKD III. Now with bump in serum creatinine. May be prerenal with decreased intake 2/2 abdominal pain.  Plan IV hydration  F/u Bmet  Type 2 diabetes mellitus with neurological complications (HCC) Last A1C 5%03/30/23. Now with elevated serum glucose. He  admits to increased intake of sugar 2/2 possible pancreatitis related pain.  Plan Sliding scale coverage.   Anemia Patient with pancytopenia. He had esophageal variceal bleed October '24. Just after discharge his Hgb was 8 g - baseline. He subsequently has had a 1.6 g drop in Hgb. He has not seen frank blood in stool, denies hematemesis. His stool is sometime very dark but he does take iron. Concern for slow leak from new esophageal varices.  Plan Transfuse 1 uPRBCs - ordered by ED-MD  Test stool for blood  GI consult - Dr, Marca [facility]con[facility])  GERD No GERD symptoms except for abdmominal pain. No large volume UGI bleed  Plan PPI PO  Essential hypertension BP is stable.  Plan  Continue home meds  Depression with anxiety Continue home meds. Seems stable at admission       DVT prophylaxis: SCDs Code Status:  CPR ok, NO INTUBATION per patient request.  Family Communication:  l eft message for [employer]  Disposition Plan: TBD  Consults called: GI - left secure chat for DR. Outlaw - on call for Dr. Presley Raddle GI-requesting consult in AM  Admission status: Inpatient, Step Down Unit   Illene Regulus, MD [facility] 05/07/2023, 11:05 PM

## 2023-05-07 NOTE — Assessment & Plan Note (Signed)
Patient with 15lb weight loss over 3 weeks. Poor appetite to due pain with eating. Does tolerate ensure with only modest pain. Albumin 2.5  Plan Prealbumin  Ensure plus 1 can bid  RD consult

## 2023-05-07 NOTE — Assessment & Plan Note (Signed)
Patient with a week of severe abdominal pain. Lab reveals Lipase of 374, AST 42. CTA abdomen 03/23/23  imaging revealed moderate ascites, no gallbladder disease, splenomegaly and varices. Paracentesis 04/05/23 removed 2.5 L serosanquinous fluid - no specimens sent to lab. On exam he has upper abdominal tenderness with rebound, no rigidity, marked tenderness in the upper epigastrum above his ileostomy. There is no fever. WBC 2.9, differential 75/15/7 in a setting of chronic pancytopenia. Differential includes alcohol pancreatitis, SBP, other.  Plan Abdominal U/S for evaluation gallbladder, for ascites and if adequate fluid present diagnostic paracentesis  Ceftriaxone 1g q 24  PCA dilaudid for pain control  IV hydration - 1/2 NS  Full liquid diet  F/u lab: lipase  GI consult

## 2023-05-07 NOTE — Assessment & Plan Note (Signed)
Patient with pancytopenia. He had esophageal variceal bleed October '24. Just after discharge his Hgb was 8 g - baseline. He subsequently has had a 1.6 g drop in Hgb. He has not seen frank blood in stool, denies hematemesis. His stool is sometime very dark but he does take iron. Concern for slow leak from new esophageal varices.  Plan Transfuse 1 uPRBCs - ordered by ED-MD  Test stool for blood  GI consult - Dr, Marca [facility]con[facility])

## 2023-05-07 NOTE — Subjective & Objective (Signed)
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 HTN, GERD, CKD 3, peripheral neuropathy. His last admission 03/23/23 related to major GI bleed secondary to variceal bleed with EGD 03/29/23 with banding, repeat bleed with shock requiring ICU admission complicated by encephalopathy, strepticoccal bacteremia with negative TEE for endocarditis requiring two weeks of oral abx. He was discharged home in stable conditio with a Hgb 8.0, nl NH3, cleared mental status, baseline pancytopenia.   Since discharge 03/31/23 he has seen his PCP Dr. Clent Ridges and has seen general surgery for a reducible abdominal wall hernia. Starting approximately 7 days ago he has upper abdominal pain described as a ball in his stomach that is much worse after eating. He rates the pain as 10/10 but denies radiation to the back, N/V. His ileostomy continued to function. He takes iron daily and does report having dark stool. As an outpatient CBC revealed Hgb of 8.0 on 04/07/23, then 6.4 on 11/18. Due to his severe abdominal pain and drop in Hgb he was referred to [facility] for evaluation.

## 2023-05-07 NOTE — Assessment & Plan Note (Signed)
Last A1C 5%03/30/23. Now with elevated serum glucose. He admits to increased intake of sugar 2/2 possible pancreatitis related pain.  Plan Sliding scale coverage.

## 2023-05-07 NOTE — ED Triage Notes (Signed)
C/o upper abd pain and weakness.  Pt reports referred to ed for low hemoglobin 6.4  Denies rectal bleeding. Denies blood thinner usage  Hx esophageal varices

## 2023-05-07 NOTE — Assessment & Plan Note (Signed)
BP is stable.  Plan  Continue home meds

## 2023-05-07 NOTE — Assessment & Plan Note (Signed)
Patient with advanced cirrhosis. He stopped drinking in October. Has portal hypertension and ascites.  Plan Continue xifaxam  Continue furosemide and sprinolactone

## 2023-05-07 NOTE — Assessment & Plan Note (Signed)
Continue home meds. Seems stable at admission

## 2023-05-07 NOTE — Assessment & Plan Note (Signed)
Patient reports intermittent irregular heart rate and has been told he has PAF. Normal Heart rate at admission and on last EKG 03/23/23  Plan EKG  Tele admit  Not a candidate for anticoagulation.

## 2023-05-08 ENCOUNTER — Inpatient Hospital Stay (HOSPITAL_COMMUNITY): Payer: Medicaid Other

## 2023-05-08 DIAGNOSIS — R109 Unspecified abdominal pain: Secondary | ICD-10-CM | POA: Diagnosis not present

## 2023-05-08 LAB — GLUCOSE, CAPILLARY
Glucose-Capillary: 129 mg/dL — ABNORMAL HIGH (ref 70–99)
Glucose-Capillary: 125 mg/dL — ABNORMAL HIGH (ref 70–99)
Glucose-Capillary: 185 mg/dL — ABNORMAL HIGH (ref 70–99)
Glucose-Capillary: 110 mg/dL — ABNORMAL HIGH (ref 70–99)

## 2023-05-08 LAB — CBC WITH DIFFERENTIAL/PLATELET
Abs Immature Granulocytes: 0.01 10*3/uL (ref 0.00–0.07)
Basophils Absolute: 0 10*3/uL (ref 0.0–0.1)
Basophils Relative: 0 %
Eosinophils Absolute: 0 10*3/uL (ref 0.0–0.5)
Eosinophils Relative: 1 %
HCT: 25.4 % — ABNORMAL LOW (ref 39.0–52.0)
Hemoglobin: 8.2 g/dL — ABNORMAL LOW (ref 13.0–17.0)
Immature Granulocytes: 0 %
Lymphocytes Relative: 10 %
Lymphs Abs: 0.3 10*3/uL — ABNORMAL LOW (ref 0.7–4.0)
MCH: 32.9 pg (ref 26.0–34.0)
MCHC: 32.3 g/dL (ref 30.0–36.0)
MCV: 102 fL — ABNORMAL HIGH (ref 80.0–100.0)
Monocytes Absolute: 0.3 10*3/uL (ref 0.1–1.0)
Monocytes Relative: 8 %
Neutro Abs: 2.7 10*3/uL (ref 1.7–7.7)
Neutrophils Relative %: 81 %
Platelets: 72 10*3/uL — ABNORMAL LOW (ref 150–400)
RBC: 2.49 MIL/uL — ABNORMAL LOW (ref 4.22–5.81)
RDW: 16.4 % — ABNORMAL HIGH (ref 11.5–15.5)
WBC: 3.3 10*3/uL — ABNORMAL LOW (ref 4.0–10.5)
nRBC: 0 % (ref 0.0–0.2)

## 2023-05-08 LAB — BASIC METABOLIC PANEL
Anion gap: 6 (ref 5–15)
BUN: 25 mg/dL — ABNORMAL HIGH (ref 6–20)
CO2: 19 mmol/L — ABNORMAL LOW (ref 22–32)
Calcium: 8.2 mg/dL — ABNORMAL LOW (ref 8.9–10.3)
Chloride: 111 mmol/L (ref 98–111)
Creatinine, Ser: 1.87 mg/dL — ABNORMAL HIGH (ref 0.61–1.24)
GFR, Estimated: 42 mL/min — ABNORMAL LOW (ref >60)
Glucose, Bld: 155 mg/dL — ABNORMAL HIGH (ref 70–99)
Potassium: 3.6 mmol/L (ref 3.5–5.1)
Sodium: 136 mmol/L (ref 135–145)

## 2023-05-08 LAB — LIPASE, BLOOD: Lipase: 174 U/L — ABNORMAL HIGH (ref 11–51)

## 2023-05-08 LAB — OCCULT BLOOD X 1 CARD TO LAB, STOOL: Fecal Occult Bld: POSITIVE — AB

## 2023-05-08 LAB — AMMONIA: Ammonia: 34 umol/L (ref 9–35)

## 2023-05-08 MED ORDER — CARMEX CLASSIC LIP BALM EX OINT
TOPICAL_OINTMENT | CUTANEOUS | Status: DC | PRN
  Filled 2023-05-08: qty 10

## 2023-05-08 MED ORDER — ADULT MULTIVITAMIN W/MINERALS CH
1.0000 | ORAL_TABLET | Freq: Every day | ORAL | Status: DC
  Administered 2023-05-09 – 2023-05-12 (×4): 1 via ORAL
  Filled 2023-05-08 (×4): qty 1

## 2023-05-08 MED ORDER — IOHEXOL 9 MG/ML PO SOLN
500.0000 mL | ORAL | Status: AC
Start: 2023-05-08 — End: 2023-05-08
  Administered 2023-05-08 (×2): 500 mL via ORAL

## 2023-05-08 MED ORDER — IOHEXOL 300 MG/ML  SOLN
75.0000 mL | Freq: Once | INTRAMUSCULAR | Status: AC | PRN
  Administered 2023-05-08: 75 mL via INTRAVENOUS

## 2023-05-08 MED ORDER — ENSURE ENLIVE PO LIQD
237.0000 mL | Freq: Three times a day (TID) | ORAL | Status: DC
Start: 2023-05-08 — End: 2023-05-12
  Administered 2023-05-08 – 2023-05-12 (×6): 237 mL via ORAL

## 2023-05-08 NOTE — Consult Note (Addendum)
Referring Provider: [facility] Primary Care Physician:  Nelwyn Salisbury, MD Primary Gastroenterologist: Gentry Fitz patient/[place] PCP  Reason for Consultation: Cirrhosis, abdominal pain, anemia  HPI: Martin Anthony is a 57 y.o. 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  He was found to have anemia with hemoglobin of 6.4 for posthospitalization follow-up on May 03, 2023.  He was advised to come to the hospital for further evaluation and management.  On initial evaluation yesterday, he was found to have anemia with hemoglobin of 6.6, platelet count of 79, stable creatinine, T. bili of 3.6 improved compared to previous 9.4, mildly elevated AST and alkaline phosphatase, mildly elevated lipase at 374, while INR of 1.7.  Occult blood was positive.  Ultrasound abdomen completed yesterday show advanced cirrhosis with mild ascites and splenomegaly as well as mild gallbladder wall thickening in the setting of cirrhosis and ascites.  Patient seen and examined at bedside.  He is complaining of generalized abdominal pain.  Denies any radiation of pain to the back today.  Denies any vomiting.  Denies seeing any bright blood in the stool.  Denies any black-colored stool in ostomy.  EGD March 23, 2023 for evaluation of GI bleed showed large esophageal varices with 4 bands placement.  Portal hypertensive gastropathy.  Colonoscopy showed normal neoterminal ileum without evidence of any bleeding in the colon.  Past Medical History:  Diagnosis Date   Anxiety    Ascites due to alcoholic cirrhosis (HCC)    Cirrhosis with alcoholism (HCC)    Depression    Diabetes mellitus without  complication (HCC)    GERD (gastroesophageal reflux disease)    Gout    Hyperlipidemia    Hypertension    Pancreatitis    Primary adenocarcinoma of ascending colon (HCC)    Substance abuse (HCC)    alcoholism    Type 2 diabetes mellitus with diabetic neuropathy [facility])     Past Surgical History:  Procedure Laterality Date   BIOPSY  01/19/2019   Procedure: BIOPSY;  Surgeon: Jeani Hawking, MD;  Location: [facility];  Service: Endoscopy;;   COLON SURGERY  10/25/2021   right hemicolectomy with ostomy at [facility] in [place]   COLONOSCOPY N/A 01/19/2019   Procedure: COLONOSCOPY;  Surgeon: Jeani Hawking, MD;  Location: [facility];  Service: Endoscopy;  Laterality: N/A;   ESOPHAGEAL BANDING  03/25/2023   Procedure: ESOPHAGEAL BANDING;  Surgeon: Kerin Salen, MD;  Location: [facility];  Service: Gastroenterology;;   ESOPHAGOGASTRODUODENOSCOPY (EGD) WITH PROPOFOL N/A 01/19/2019   Procedure: ESOPHAGOGASTRODUODENOSCOPY (EGD) WITH PROPOFOL;  Surgeon: Jeani Hawking, MD;  Location: [facility];  Service: Endoscopy;  Laterality: N/A;   ESOPHAGOGASTRODUODENOSCOPY (EGD) WITH PROPOFOL N/A 03/25/2023   Procedure: ESOPHAGOGASTRODUODENOSCOPY (EGD) WITH PROPOFOL;  Surgeon: Kerin Salen, MD;  Location: [facility];  Service: Gastroenterology;  Laterality: N/A;   HEMOSTASIS CLIP PLACEMENT  01/19/2019   Procedure: HEMOSTASIS CLIP PLACEMENT;  Surgeon: Jeani Hawking, MD;  Location: [facility];  Service: Endoscopy;;   HERNIA REPAIR     umbilical    ILEOSCOPY N/A 03/25/2023   Procedure: ILEOSCOPY THROUGH STOMA;  Surgeon: Kerin Salen, MD;  Location: [facility];  Service: Gastroenterology;  Laterality: N/A;   INGUINAL HERNIA REPAIR Bilateral    IR FLUORO GUIDE CV LINE  RIGHT  05/13/2021   IR PARACENTESIS  05/19/2021   IR US GUIDE VASC ACCESS RIGHT  05/13/2021   POLYPECTOMY  01/19/2019   Procedure: POLYPECTOMY;  Surgeon: Jeani Hawking, MD;  Location: [facility];  Service: Endoscopy;;    Prior to  Admission medications   Medication Sig Start Date End Date Taking? Authorizing Provider  carvedilol (COREG) 3.125 MG tablet Take 3.125 mg by mouth 2 (two) times daily with a meal.   Yes [provider]  folic acid (FOLVITE) 1 MG tablet Take 1 tablet (1 mg total) by mouth daily. 05/21/21  Yes Leroy Sea, MD  furosemide (LASIX) 20 MG tablet Take 1 tablet (20 mg total) by mouth daily. 04/08/23  Yes Pahwani, Daleen Bo, MD  gabapentin (NEURONTIN) 100 MG capsule Take 1 capsule (100 mg total) by mouth 3 (three) times daily. Patient taking differently: Take 100 mg by mouth 2 (two) times daily. 04/08/23 05/08/23 Yes Pahwani, Daleen Bo, MD  gabapentin (NEURONTIN) 300 MG capsule Take 300 mg by mouth at bedtime.   Yes [provider]  Iron, Ferrous Sulfate, 325 (65 Fe) MG TABS Take 325 mg by mouth daily. Patient taking differently: Take 325 mg by mouth daily with breakfast. 12/18/21  Yes Nelwyn Salisbury, MD  midodrine (PROAMATINE) 2.5 MG tablet Take 1 tablet (2.5 mg total) by mouth 3 (three) times daily with meals. Patient taking differently: Take 2.5 mg by mouth in the morning and at bedtime. 04/08/23 05/08/23 Yes Pahwani, Daleen Bo, MD  nicotine (NICODERM CQ - DOSED IN MG/24 HOURS) 14 mg/24hr patch Place 1 patch (14 mg total) onto the skin daily. Patient taking differently: Place 7 mg onto the skin daily. 04/08/23 04/07/24 Yes Pahwani, Daleen Bo, MD  nystatin (MYCOSTATIN/NYSTOP) powder Apply 1 Application topically 3 (three) times daily. Patient taking differently: Apply 1 Application topically See admin instructions. Apply to affected area three times a day 05/03/23  Yes Nelwyn Salisbury, MD  pantoprazole (PROTONIX) 40 MG tablet Take 1 tablet (40 mg total) by mouth daily. Patient taking differently: Take 40 mg by mouth daily before breakfast. 05/21/21  Yes Leroy Sea, MD  rifaximin (XIFAXAN) 550 MG TABS tablet Take 1 tablet (550 mg total) by mouth 2 (two) times daily. 01/20/22  Yes Nelwyn Salisbury, MD   sertraline (ZOLOFT) 50 MG tablet Take 1 tablet (50 mg total) by mouth daily. 02/02/23  Yes Nelwyn Salisbury, MD  spironolactone (ALDACTONE) 25 MG tablet Take 0.5 tablets (12.5 mg total) by mouth daily. Patient taking differently: Take 25 mg by mouth in the morning. 04/08/23 05/08/23 Yes Pahwani, Daleen Bo, MD  thiamine 100 MG tablet Take 1 tablet (100 mg total) by mouth daily. 05/21/21  Yes Leroy Sea, MD  nadolol (CORGARD) 20 MG tablet Take 1 tablet (20 mg total) by mouth daily. Patient not taking: Reported on 05/07/2023 12/05/21   Nelwyn Salisbury, MD    Scheduled Meds:  carvedilol  3.125 mg Oral BID WC   feeding supplement  237 mL Oral BID BM   ferrous sulfate  325 mg Oral Q breakfast   folic acid  1 mg Oral Daily   furosemide  20 mg Oral Daily   gabapentin  100 mg Oral BID   gabapentin  300 mg Oral QHS   HYDROmorphone   Intravenous Q4H   insulin aspart  0-15 Units Subcutaneous TID WC   midodrine  2.5 mg Oral TID WC   nicotine  7 mg Transdermal Daily   nystatin  1 Application Topical  TID   pantoprazole  40 mg Oral QAC breakfast   rifaximin  550 mg Oral BID   sertraline  50 mg Oral Daily   spironolactone  25 mg Oral Daily   thiamine  100 mg Oral Daily   Continuous Infusions:  sodium chloride 75 mL/hr at 05/08/23 0318   cefTRIAXone (ROCEPHIN)  IV 2 g (05/08/23 0322)   PRN Meds:.diphenhydrAMINE **OR** diphenhydrAMINE, lip balm, naloxone **AND** sodium chloride flush, ondansetron (ZOFRAN) IV  Allergies as of 05/07/2023 - Review Complete 05/07/2023  Allergen Reaction Noted   Acetaminophen Other (See Comments) 10/19/2021   Ibuprofen Other (See Comments) 10/19/2021    Family History  Problem Relation Age of Onset   Hypertension Mother    Diabetes Father    Heart attack Father    Pneumonia Father    Hypertension Father    Diabetes Maternal Grandfather     Social History   Socioeconomic History   Marital status: Divorced    Spouse name: Not on file   Number of children:  Not on file   Years of education: Not on file   Highest education level: Bachelor's degree (e.g., BA, AB, BS)  Occupational History   Not on file  Tobacco Use   Smoking status: Former    Current packs/day: 0.00    Types: Cigarettes    Quit date: 08/11/2001    Years since quitting: 21.7   Smokeless tobacco: Never  Substance and Sexual Activity   Alcohol use: Yes    Alcohol/week: 2.0 standard drinks of alcohol    Types: 2 Standard drinks or equivalent per week   Drug use: No   Sexual activity: Not on file  Other Topics Concern   Not on file  Social History Narrative   Not on file   Social Determinants of Health   Financial Resource Strain: Low Risk  (10/06/2022)   [facility]   Overall Financial Resource Strain (CARDIA)    Difficulty of Paying Living Expenses: Not hard at all  Food Insecurity: No Food Insecurity (05/08/2023)   Hunger Vital Sign    Worried About Running Out of Food in the Last Year: Never true    Ran Out of Food in the Last Year: Never true  Recent Concern: Food Insecurity - Food Insecurity Present (03/23/2023)   Hunger Vital Sign    Worried About Running Out of Food in the Last Year: Sometimes true    Ran Out of Food in the Last Year: Sometimes true  Transportation Needs: No Transportation Needs (05/08/2023)   PRAPARE - Administrator, Civil Service (Medical): No    Lack of Transportation (Non-Medical): No  Recent Concern: Transportation Needs - Unmet Transportation Needs (03/23/2023)   PRAPARE - Administrator, Civil Service (Medical): Yes    Lack of Transportation (Non-Medical): Not on file  Physical Activity: Unknown (12/05/2021)   Exercise Vital Sign    Days of Exercise per Week: 0 days    Minutes of Exercise per Session: Not on file  Stress: Stress Concern Present (12/05/2021)   [employer] of Occupational Health - Occupational Stress  Questionnaire    Feeling of Stress : Very much  Social Connections: Unknown (08/11/2022)   [facility] from [facility], [facility]   Social Network    Social Network: Not on file  Intimate Partner Violence: Not At Risk (05/08/2023)   Humiliation, Afraid, Rape, and Kick questionnaire  Fear of Current or Ex-Partner: No    Emotionally Abused: No    Physically Abused: No    Sexually Abused: No    Review of Systems: All negative except as stated above in HPI.  Physical Exam: Vital signs: Vitals:   05/08/23 0429 05/08/23 0818  BP: (!) 99/59 133/74  Pulse: 64 75  Resp: 18 12  Temp: (!) 97.5 F (36.4 C) 98.1 F (36.7 C)  SpO2: 98% 100%   Last BM Date : 05/08/23 General:   Alert,  Well-developed, well-nourished, pleasant and cooperative in NAD Lungs: No visible respiratory distress Heart:  Regular rate and rhythm; no murmurs, clicks, rubs,  or gallops. Abdomen: Soft, nontender, nondistended, bowel sound present, midline ostomy in place.  No peritoneal signs.  No significant ascites on exam No lower extremity edema Mood and affect normal Alert and oriented x 3 Rectal:  Deferred  GI:  Lab Results: Recent Labs    05/07/23 1852  WBC 2.5*  HGB 6.6*  HCT 20.3*  PLT 79*   BMET Recent Labs    05/07/23 1852 05/08/23 0604  NA 130* 136  K 3.2* 3.6  CL 105 111  CO2 17* 19*  GLUCOSE 381* 155*  BUN 28* 25*  CREATININE 1.95* 1.87*  CALCIUM 8.4* 8.2*   LFT Recent Labs    05/07/23 1852  PROT 7.8  ALBUMIN 2.5*  AST 42*  ALT 20  ALKPHOS 136*  BILITOT 3.6*   PT/INR Recent Labs    05/07/23 1852  LABPROT 19.9*  INR 1.7*     Studies/Results: US Abdomen Complete  Result Date: 05/08/2023 CLINICAL DATA:  Abdominal pain EXAM: ABDOMEN ULTRASOUND COMPLETE COMPARISON:  04/06/2023, CT 03/26/2023 FINDINGS: Gallbladder: The gallbladder wall is markedly thickened, nonspecific in the setting of cirrhosis and ascites. This appears similar to prior examination. The  gallbladder, however, is not distended. No intraluminal stones or sludge is identified. The sonographic Eulah Pont sign is reportedly negative. Common bile duct: The extrahepatic bile duct is not well visualized on this examination. Liver: The liver contour is nodular, the liver is shrunken, and the hepatic echotexture is diffusely coarsened in keeping with changes of advanced cirrhosis. No focal intrahepatic mass identified. No intrahepatic biliary ductal dilation. Portal vein is patent on color Doppler imaging with normal direction of blood flow towards the liver. IVC: No abnormality visualized. Pancreas: Not well visualized on this examination Spleen: Moderately enlarged measuring up to 19.1 cm in greatest dimension. No intrasplenic lesions identified. Right Kidney: Length: 9.1 cm. Echogenicity within normal limits. No mass or hydronephrosis visualized. Left Kidney: Length: 10.3 cm. Echogenicity within normal limits. No mass or hydronephrosis visualized. Abdominal aorta: No aneurysm visualized. Other findings: Mild ascites is present, largely perihepatic in nature, similar to prior CT examination. IMPRESSION: 1. Advanced cirrhosis. No focal intrahepatic mass identified. 2. Moderate splenomegaly and mild ascites, similar to prior examination in keeping with changes of portal venous hypertension. 3. Marked gallbladder wall thickening, nonspecific in the setting of cirrhosis and ascites. No intraluminal stones or sludge identified. No definite sonographic evidence of acute cholecystitis. Electronically Signed   By: Helyn Numbers M.D.   On: 05/08/2023 00:32    Impression/Plan: -Anemia with hemoglobin of 6.6 in a patient with history of cirrhosis complicated by esophageal varices requiring band ligation last month.  No overt bleeding at this time. -Abdominal pain. ?  Etiology.  Mild elevated lipase -Ascites.  Had paracentesis last admission.  Fluid analysis was negative for SBP. -History of colon cancer status post  right hemicolectomy with end ileostomy. -Chronic kidney disease  Recommendations --------------------------- -Plan for EGD tomorrow. -Patient is complaining of significant pain currently on PCA pump.  Blood work and ultrasound finding does not correlate with degree of abdominal pain.  Recommend CT abdomen pelvis with IV contrast for further evaluation. -Okay to have full liquid diet today.  Keep n.p.o. past midnight. -Does not have any active bleeding at this time.  I think it is okay to hold off on octreotide drip at this time. -Underwent paracentesis today but there was not enough fluid for drainage. -Continue furosemide, spironolactone, Protonix and rifaximin.  Risks (bleeding, infection, bowel perforation that could require surgery, sedation-related changes in cardiopulmonary systems), benefits (identification and possible treatment of source of symptoms, exclusion of certain causes of symptoms), and alternatives (watchful waiting, radiographic imaging studies, empiric medical treatment)  were explained to patient/family in detail and patient wishes to proceed.     LOS: 1 day   Kathi Der  MD, FACP 05/08/2023, 10:58 AM  Contact #  [phone]

## 2023-05-08 NOTE — Progress Notes (Signed)
Patient ID: Martin Anthony, male   DOB: 02-05-1966, 57 y.o.   MRN: [id number] Pt presented to [name] dept today for paracentesis. On limited US abd in all 4 quadrants there is only a small amount of ascites present and not enough to safely aspirate at this time due to proximity to organs. Can reevaluate in a few days if necessary.

## 2023-05-08 NOTE — Progress Notes (Addendum)
Initial Nutrition Assessment  DOCUMENTATION CODES:   Not applicable  INTERVENTION:  Ordered 48 hour calorie count per consult. RN to hang envelope and begin calorie count today. Document percent consumed for each item on the patient's meal tray ticket and keep in envelope. Also document percent of any supplement or snack pt consumes and keep documentation in envelope for RD to review.  -RD to follow-up on results of calorie count after the weekend.  Will monitor for diet advancement per MD. Previously ordered for full liquid diet prior to being made NPO.  Increase to Ensure Enlive po TID, each supplement provides 350 kcal and 20 grams of protein.  Recommend monitoring phosphorus, magnesium, and potassium daily x at least 3 days, MD to replace as needed, as pt may be at risk for refeeding syndrome.  Continue folic acid 1 mg daily and thiamine 100 mg daily. Also provide multivitamin with minerals po daily.  NUTRITION DIAGNOSIS:   Inadequate oral intake related to decreased appetite (pain with eating) as evidenced by  (per H&P).  GOAL:   Patient will meet greater than or equal to 90% of their needs  MONITOR:   PO intake, Supplement acceptance, Diet advancement, Labs, Weight trends, I & O's  REASON FOR ASSESSMENT:   Consult Poor PO, Calorie Count  ASSESSMENT:   57 year old male with PMHx of advanced cirrhosis with portal hypertension and multiple varices, ascites, pancytopenia, adenocarcinoma of the colon s/p hemicolectomy with creation of ileostomy, diet controlled DM, HTN, GERD, CKD stage 3, peripheral neuropathy, reducible abdominal wall hernia recent admission 03/23/23 related to major GI bleed secondary to variceal bleed s/p banding on 03/29/23 with repeat bleed and shock requiring ICU admission complicated by encephalopathy, strepticoccal bacteremia with negative TEE for endocarditis requiring two weeks of oral antibiotics. Now admitted with one week history of severe abdominal  pain, also with concern for malnutrition due to poor appetite and recent weight loss.  RD is working remotely. Attempted to call patient over the phone to obtain nutrition history, but unable to reach. Suspect pt may be in procedure as ordered for US paracentesis today.  Per review of H&P pt had reported on admission he has poor appetite due to pain with eating. He is able to tolerate Ensure with only modest pain. Currently NPO since midnight for procedure today. RD will increase Ensure to TID. Will monitor for diet advancement per MD. Previously ordered for full liquid diet prior to being made NPO. No nutrition-related allergies documented. Noted pt with ileostomy.  Per H&P pt reported 15 lb weight loss over the pats 3 weeks. Weights fluctuate in chart. Pt was documented to be 85 kg on 04/07/23. Currently 77.8 kg on admission. That is a weight loss of 7.2 kg or 8.5% weight over the past month, which would be significant for time frame if weights in chart are accurate.  Medications reviewed and include: carvedilol, Ensure BID, ferrous sulfate 325 mg daily, folic acid 1 mg daily, Lasix 20 mg daily, gabapentin, Dilaudid PCA, Novolog 0-15 unit sTID, nicotine patch, pantoprazole, spironolactone, thiamine 100 mg daily, ceftriaxone, NS at 75 mL/hr  Labs reviewed: CBG 129, CO2 19, BUN 25, Creatinine 1.87 HgbA1c 5.0 03/30/23  UOP: 175 mL UOP so far today (admitted < 24 hrs)  I/O: +428.9 mL since admission  RD also consulted for calorie count. Discussed with RN via secure chat this AM. RN to hang envelope and begin calorie count today. Order placed with instructions for calorie count.  NUTRITION - FOCUSED  PHYSICAL EXAM:  Unable to complete as RD is working remotely.  Diet Order:   Diet Order             Diet NPO time specified  Diet effective midnight                  EDUCATION NEEDS:   No education needs have been identified at this time  Skin:  Skin Assessment: Reviewed RN  Assessment  Last BM:  05/08/23 - ileostomy present  Height:   Ht Readings from Last 1 Encounters:  05/07/23 6\' 4"  (1.93 m)   Weight:   Wt Readings from Last 1 Encounters:  05/07/23 77.8 kg   Ideal Body Weight:  91.8 kg  BMI:  Body mass index is 20.88 kg/m.  Estimated Nutritional Needs:   Kcal:  2100-2300  Protein:  105-115 grams  Fluid:  2.1-2.3 L/day  Letta Median, MS, RD, LDN, CNSC Pager number available on Amion

## 2023-05-08 NOTE — Progress Notes (Addendum)
HOSPITALIST ROUNDING NOTE Martin Anthony ZOX:[id number]  DOB: 1966-05-28  DOA: 05/07/2023  PCP: Nelwyn Salisbury, MD  05/08/2023,7:26 AM   LOS: 1 day      Code Status: full  From:  home    Current Dispo: home     57 year old male known A-fib CHADVASC >4 not on anticoagulation Prior EtOH DM TY 2 peripheral neuropathy CKD 3 Previous high-grade tubular adenoma Liver cirrhosis presumed EtOH with large esophageal varices since at least 2020-first found 01/19/2019 by Dr. Elnoria Howard Subsequent hospitalization 2022 with AKI secondary to rhabdo and being found down and was on the verge of dialysis but he improved to the point that he could go home hemicolectomy for pT3 N0 stage II-III ascending colon cancer with ileostomy [place] 10/25/2021 Hospitalized 10/8--10/24 blood in ileostomy bag hemoglobin 6.9 CT showed inflammatory stranding duodenitis no active bleeding on angiogram--rx octreotide Protonix Xifaxan-EGD showed grade 3 varices that were banded --COmplication of agitated delirium 03/25/2023 and Precedex was started 10/11 and patient went to the ICU--palliative care consulted MELD 24-not a candidate for transplant-he had ascites SBP r/o paracentesis X2--it was felt that he might have a liver mass additionally and that we will need a triple phase follow-up CT discharged home with 2 weeks of antibiotics for TEE negative questionable endocarditis and went home with hemoglobin of 8  Went to PCP office for reducible abdominal wall hernia with worsening abdominal pain much worse after eating 10/10 pain ostomy functioning-was referred by PCP--> Dr. Sherlynn Stalls to evaluate the hernia  Labs showed a drop in hemoglobin from 8-6.4 and he was referred by PCP to ED Sodium 130 potassium 3.2 CO2 17 BUN/creatinine 28/1.9 (baseline about 2) Alk phos 136 lipase 374 AST/ALT 42/20 WBC 2.5 hemoglobin 6.6 MCV 102 platelet 79--INR 1.7 [name] ABD advanced cirrhosis no focal intrahepatic mass moderate splenomegaly ascites with portal  venous hypertension marked gallbladder wall thickening no sludge stones no sonographic evidence of acute cholecystitis  Plan  Slow GI bleed? Defer to GI--upper endoscopy planned?  Clear liquid diet-for now continue Protonix 40 daily If hemoglobin still low will need another transfusion today Would   SBP  Ultrasound paracentesis unsuccessful-continue ceftriaxone empirically for now-retrial in a.m.-Daily weights  Pancreatitis? Lipase elevated 317 but always is somewhat elevated since 03/30/2023-anyways on a conservative diet we will continue PCA for pain control today and plan on de-escalating in the next 24 hours or conversion to orals  Liver cirrhosis MELD score about 24 with additional pancytopenia secondary to this Continue Aldactone 25 Lasix 20--might change to nonselective BB Continue midodrine for chronic hypotension 2.5 3 times daily continue rifaximin 550 twice daily to prevent encephalopathy  pT3 N0 colon cancer with ileostomy 10/25/2021 Inguinal hernias probably nonoperable Sounds like he is not a candidate for further surgery-defer to outpatient surgeons  DM TY 2 complication of neuropathy Continue gabapentin 300 at bedtime and 100 twice daily meal  A-fib CHADVASC >4 has bled score >3 Not a candidate for anticoagulation-on monitors is sinus rhythm Currently on Coreg may need to switch back to nadolol as above  Underlying CKD 3  Goals of care Is still full code has been seen by palliative care previously-May need to revisit this discussion in the outpatient setting   DVT prophylaxis: SCD  Status is: Inpatient Remains inpatient appropriate because:   Requires scope and further care      Subjective: Awake coherent no distress some pain 5/10 central epigastrium no rebound Eating and drinking clear liquids this is the first meals he  has had No fever no chills  Objective + exam Vitals:   05/08/23 0134 05/08/23 0322 05/08/23 0325 05/08/23 0429  BP:   126/72 (!)  99/59  Pulse:   60 64  Resp: 14 13 13 18   Temp:   98.1 F (36.7 C) (!) 97.5 F (36.4 C)  TempSrc:   Oral Oral  SpO2: 100% 100%  98%  Weight:      Height:       Filed Weights   05/07/23 1829 05/07/23 2339  Weight: 78 kg 77.8 kg    Examination:  EOMI NCAT no focal deficit no icterus no pallor no wheeze no rales no rhonchi Trace lower extremity edema No shifting dullness S1-S2 2 seems to be in sinus predominantly ROM intact Neuro intact  Data Reviewed: reviewed   CBC    Component Value Date/Time   WBC 2.5 (L) 05/07/2023 1852   RBC 1.98 (L) 05/07/2023 1852   HGB 6.6 (LL) 05/07/2023 1852   HCT 20.3 (L) 05/07/2023 1852   PLT 79 (L) 05/07/2023 1852   MCV 102.5 (H) 05/07/2023 1852   MCH 33.3 05/07/2023 1852   MCHC 32.5 05/07/2023 1852   RDW 15.4 05/07/2023 1852   LYMPHSABS 0.4 (L) 05/07/2023 1852   MONOABS 0.2 05/07/2023 1852   EOSABS 0.1 05/07/2023 1852   BASOSABS 0.0 05/07/2023 1852      Latest Ref Rng & Units 05/08/2023    6:04 AM 05/07/2023    6:52 PM 05/03/2023    3:59 PM  CMP  Glucose 70 - 99 mg/dL 409  811  914   BUN 6 - 20 mg/dL 25  28  25    Creatinine 0.61 - 1.24 mg/dL 7.82  9.56  2.13   Sodium 135 - 145 mmol/L 136  130  136   Potassium 3.5 - 5.1 mmol/L 3.6  3.2  4.1   Chloride 98 - 111 mmol/L 111  105  107   CO2 22 - 32 mmol/L 19  17  20    Calcium 8.9 - 10.3 mg/dL 8.2  8.4  8.5   Total Protein 6.5 - 8.1 g/dL  7.8    Total Bilirubin <1.2 mg/dL  3.6    Alkaline Phos 38 - 126 U/L  136    AST 15 - 41 U/L  42    ALT 0 - 44 U/L  20       Scheduled Meds:  carvedilol  3.125 mg Oral BID WC   feeding supplement  237 mL Oral BID BM   ferrous sulfate  325 mg Oral Q breakfast   folic acid  1 mg Oral Daily   furosemide  20 mg Oral Daily   gabapentin  100 mg Oral BID   gabapentin  300 mg Oral QHS   HYDROmorphone   Intravenous Q4H   insulin aspart  0-15 Units Subcutaneous TID WC   midodrine  2.5 mg Oral TID WC   nicotine  7 mg Transdermal Daily    nystatin  1 Application Topical TID   pantoprazole  40 mg Oral QAC breakfast   rifaximin  550 mg Oral BID   sertraline  50 mg Oral Daily   spironolactone  25 mg Oral Daily   thiamine  100 mg Oral Daily   Continuous Infusions:  sodium chloride 75 mL/hr at 05/08/23 0318   cefTRIAXone (ROCEPHIN)  IV 2 g (05/08/23 0322)    Time  50  Rhetta Mura, MD  [facility]

## 2023-05-08 NOTE — Plan of Care (Signed)
  Problem: Metabolic: Goal: Ability to maintain appropriate glucose levels will improve Outcome: Progressing   Problem: Skin Integrity: Goal: Risk for impaired skin integrity will decrease Outcome: Progressing   Problem: Tissue Perfusion: Goal: Adequacy of tissue perfusion will improve Outcome: Progressing   Problem: Activity: Goal: Risk for activity intolerance will decrease Outcome: Progressing   Problem: Coping: Goal: Level of anxiety will decrease Outcome: Progressing   Problem: Elimination: Goal: Will not experience complications related to bowel motility Outcome: Progressing Goal: Will not experience complications related to urinary retention Outcome: Completed/Met   Problem: Pain Management: Goal: General experience of comfort will improve Outcome: Progressing   Problem: Safety: Goal: Ability to remain free from injury will improve Outcome: Progressing   Problem: Skin Integrity: Goal: Risk for impaired skin integrity will decrease Outcome: Progressing

## 2023-05-09 ENCOUNTER — Encounter (HOSPITAL_COMMUNITY)
Admission: EM | Disposition: A | Discharge status: Home / Self Care | Payer: Self-pay | Source: Home / Self Care | Attending: Family Medicine

## 2023-05-09 ENCOUNTER — Inpatient Hospital Stay (HOSPITAL_COMMUNITY): Payer: Self-pay | Admitting: Anesthesiology

## 2023-05-09 ENCOUNTER — Inpatient Hospital Stay (HOSPITAL_COMMUNITY): Payer: Medicaid Other | Admitting: Anesthesiology

## 2023-05-09 DIAGNOSIS — I8500 Esophageal varices without bleeding: Secondary | ICD-10-CM

## 2023-05-09 DIAGNOSIS — K766 Portal hypertension: Secondary | ICD-10-CM | POA: Diagnosis not present

## 2023-05-09 DIAGNOSIS — R109 Unspecified abdominal pain: Secondary | ICD-10-CM | POA: Diagnosis not present

## 2023-05-09 HISTORY — PX: BIOPSY: SHX5522

## 2023-05-09 HISTORY — PX: ESOPHAGOGASTRODUODENOSCOPY (EGD) WITH PROPOFOL: SHX5813

## 2023-05-09 LAB — MAGNESIUM: Magnesium: 1.6 mg/dL — ABNORMAL LOW (ref 1.7–2.4)

## 2023-05-09 LAB — COMPREHENSIVE METABOLIC PANEL
ALT: 18 U/L (ref 0–44)
AST: 38 U/L (ref 15–41)
Albumin: 2 g/dL — ABNORMAL LOW (ref 3.5–5.0)
Alkaline Phosphatase: 69 U/L (ref 38–126)
Anion gap: 4 — ABNORMAL LOW (ref 5–15)
BUN: 25 mg/dL — ABNORMAL HIGH (ref 6–20)
CO2: 19 mmol/L — ABNORMAL LOW (ref 22–32)
Calcium: 7.7 mg/dL — ABNORMAL LOW (ref 8.9–10.3)
Chloride: 106 mmol/L (ref 98–111)
Creatinine, Ser: 1.89 mg/dL — ABNORMAL HIGH (ref 0.61–1.24)
GFR, Estimated: 41 mL/min — ABNORMAL LOW (ref >60)
Glucose, Bld: 130 mg/dL — ABNORMAL HIGH (ref 70–99)
Potassium: 3.4 mmol/L — ABNORMAL LOW (ref 3.5–5.1)
Sodium: 129 mmol/L — ABNORMAL LOW (ref 135–145)
Total Bilirubin: 2.7 mg/dL — ABNORMAL HIGH (ref <1.2)
Total Protein: 6 g/dL — ABNORMAL LOW (ref 6.5–8.1)

## 2023-05-09 LAB — CBC
HCT: 20.4 % — ABNORMAL LOW (ref 39.0–52.0)
Hemoglobin: 6.6 g/dL — CL (ref 13.0–17.0)
MCH: 33.2 pg (ref 26.0–34.0)
MCHC: 32.4 g/dL (ref 30.0–36.0)
MCV: 102.5 fL — ABNORMAL HIGH (ref 80.0–100.0)
Platelets: 52 10*3/uL — ABNORMAL LOW (ref 150–400)
RBC: 1.99 MIL/uL — ABNORMAL LOW (ref 4.22–5.81)
RDW: 16.3 % — ABNORMAL HIGH (ref 11.5–15.5)
WBC: 1.7 10*3/uL — ABNORMAL LOW (ref 4.0–10.5)
nRBC: 0 % (ref 0.0–0.2)

## 2023-05-09 LAB — GLUCOSE, CAPILLARY
Glucose-Capillary: 112 mg/dL — ABNORMAL HIGH (ref 70–99)
Glucose-Capillary: 99 mg/dL (ref 70–99)
Glucose-Capillary: 102 mg/dL — ABNORMAL HIGH (ref 70–99)
Glucose-Capillary: 139 mg/dL — ABNORMAL HIGH (ref 70–99)
Glucose-Capillary: 146 mg/dL — ABNORMAL HIGH (ref 70–99)

## 2023-05-09 LAB — PREPARE RBC (CROSSMATCH)

## 2023-05-09 LAB — HEMOGLOBIN AND HEMATOCRIT, BLOOD
HCT: 26.8 % — ABNORMAL LOW (ref 39.0–52.0)
Hemoglobin: 8.8 g/dL — ABNORMAL LOW (ref 13.0–17.0)

## 2023-05-09 LAB — PHOSPHORUS: Phosphorus: 3.9 mg/dL (ref 2.5–4.6)

## 2023-05-09 LAB — PROTIME-INR
INR: 1.8 — ABNORMAL HIGH (ref 0.8–1.2)
Prothrombin Time: 21.1 s — ABNORMAL HIGH (ref 11.4–15.2)

## 2023-05-09 LAB — TYPE AND SCREEN
ABO/RH(D): O POS
Antibody Screen: NEGATIVE

## 2023-05-09 SURGERY — ESOPHAGOGASTRODUODENOSCOPY (EGD) WITH PROPOFOL
Anesthesia: Monitor Anesthesia Care

## 2023-05-09 MED ORDER — PANTOPRAZOLE SODIUM 40 MG IV SOLR
40.0000 mg | Freq: Two times a day (BID) | INTRAVENOUS | Status: DC
  Administered 2023-05-09 – 2023-05-12 (×7): 40 mg via INTRAVENOUS
  Filled 2023-05-09 (×8): qty 10

## 2023-05-09 MED ORDER — LIDOCAINE 2% (20 MG/ML) 5 ML SYRINGE
INTRAMUSCULAR | Status: DC | PRN
  Administered 2023-05-09: 60 mg via INTRAVENOUS

## 2023-05-09 MED ORDER — SUCRALFATE 1 G PO TABS
1.0000 g | ORAL_TABLET | Freq: Three times a day (TID) | ORAL | Status: DC
  Administered 2023-05-09 – 2023-05-12 (×13): 1 g via ORAL
  Filled 2023-05-09 (×13): qty 1

## 2023-05-09 MED ORDER — HYDROMORPHONE HCL 1 MG/ML IJ SOLN
1.0000 mg | INTRAMUSCULAR | Status: DC | PRN
  Administered 2023-05-09 – 2023-05-10 (×4): 1 mg via INTRAVENOUS
  Filled 2023-05-09 (×4): qty 1

## 2023-05-09 MED ORDER — SPIRONOLACTONE 25 MG PO TABS
50.0000 mg | ORAL_TABLET | Freq: Every day | ORAL | Status: DC
  Administered 2023-05-09 – 2023-05-11 (×3): 50 mg via ORAL
  Filled 2023-05-09 (×3): qty 2

## 2023-05-09 MED ORDER — SODIUM CHLORIDE 0.9 % IV SOLN: INTRAVENOUS | Status: DC | PRN

## 2023-05-09 MED ORDER — PROPOFOL 10 MG/ML IV BOLUS
INTRAVENOUS | Status: AC
  Filled 2023-05-09: qty 20

## 2023-05-09 MED ORDER — SODIUM CHLORIDE 0.9% IV SOLUTION: Freq: Once | INTRAVENOUS | Status: AC

## 2023-05-09 MED ORDER — FUROSEMIDE 10 MG/ML IJ SOLN
20.0000 mg | Freq: Once | INTRAMUSCULAR | Status: AC
  Administered 2023-05-09: 20 mg via INTRAVENOUS
  Filled 2023-05-09: qty 2

## 2023-05-09 MED ORDER — OXYCODONE HCL 5 MG PO TABS
5.0000 mg | ORAL_TABLET | ORAL | Status: DC | PRN
  Administered 2023-05-09 – 2023-05-12 (×14): 5 mg via ORAL
  Filled 2023-05-09 (×14): qty 1

## 2023-05-09 MED ORDER — ACETAMINOPHEN 325 MG PO TABS
650.0000 mg | ORAL_TABLET | Freq: Once | ORAL | Status: AC
Start: 2023-05-09 — End: 2023-05-09
  Administered 2023-05-09: 650 mg via ORAL
  Filled 2023-05-09: qty 2

## 2023-05-09 MED ORDER — PROPOFOL 500 MG/50ML IV EMUL
INTRAVENOUS | Status: DC | PRN
  Administered 2023-05-09: 40 mg via INTRAVENOUS
  Administered 2023-05-09: 80 mg via INTRAVENOUS

## 2023-05-09 MED ORDER — SODIUM CHLORIDE 0.9% IV SOLUTION: Freq: Once | INTRAVENOUS | Status: DC

## 2023-05-09 MED ORDER — DIPHENHYDRAMINE HCL 25 MG PO CAPS
25.0000 mg | ORAL_CAPSULE | Freq: Once | ORAL | Status: AC
Start: 2023-05-09 — End: 2023-05-09
  Administered 2023-05-09: 25 mg via ORAL
  Filled 2023-05-09: qty 1

## 2023-05-09 SURGICAL SUPPLY — 14 items

## 2023-05-09 NOTE — Plan of Care (Signed)
  Problem: Education: Goal: Ability to describe self-care measures that may prevent or decrease complications (Diabetes Survival Skills Education) will improve Outcome: Progressing Goal: Individualized Educational Video(s) Outcome: Progressing   Problem: Coping: Goal: Ability to adjust to condition or change in health will improve Outcome: Progressing   Problem: Fluid Volume: Goal: Ability to maintain a balanced intake and output will improve Outcome: Progressing   Problem: Health Behavior/Discharge Planning: Goal: Ability to identify and utilize available resources and services will improve Outcome: Progressing Goal: Ability to manage health-related needs will improve Outcome: Progressing   Problem: Metabolic: Goal: Ability to maintain appropriate glucose levels will improve Outcome: Progressing   Problem: Nutritional: Goal: Maintenance of adequate nutrition will improve Outcome: Progressing Goal: Progress toward achieving an optimal weight will improve Outcome: Progressing   Problem: Skin Integrity: Goal: Risk for impaired skin integrity will decrease Outcome: Progressing   Problem: Tissue Perfusion: Goal: Adequacy of tissue perfusion will improve Outcome: Progressing   Problem: Education: Goal: Knowledge of General Education information will improve Description: Including pain rating scale, medication(s)/side effects and non-pharmacologic comfort measures Outcome: Progressing   Problem: Health Behavior/Discharge Planning: Goal: Ability to manage health-related needs will improve Outcome: Progressing   Problem: Clinical Measurements: Goal: Ability to maintain clinical measurements within normal limits will improve Outcome: Progressing Goal: Will remain free from infection Outcome: Progressing Goal: Diagnostic test results will improve Outcome: Progressing Goal: Respiratory complications will improve Outcome: Progressing Goal: Cardiovascular complication will  be avoided Outcome: Progressing   Problem: Activity: Goal: Risk for activity intolerance will decrease Outcome: Progressing   Problem: Nutrition: Goal: Adequate nutrition will be maintained Outcome: Progressing   Problem: Coping: Goal: Level of anxiety will decrease Outcome: Progressing   Problem: Elimination: Goal: Will not experience complications related to bowel motility Outcome: Progressing   Problem: Pain Management: Goal: General experience of comfort will improve Outcome: Progressing   Problem: Safety: Goal: Ability to remain free from injury will improve Outcome: Progressing   Problem: Skin Integrity: Goal: Risk for impaired skin integrity will decrease Outcome: Progressing

## 2023-05-09 NOTE — Anesthesia Preprocedure Evaluation (Addendum)
Anesthesia Evaluation  Patient identified by MRN, date of birth, ID band Patient awake    Reviewed: Allergy & Precautions, NPO status , Patient's Chart, lab work & pertinent test results, reviewed documented beta blocker date and time   History of Anesthesia Complications Negative for: history of anesthetic complications  Airway Mallampati: II  TM Distance: >3 FB Neck ROM: Full    Dental  (+) Chipped,    Pulmonary former smoker   Pulmonary exam normal        Cardiovascular hypertension, Pt. on home beta blockers Normal cardiovascular exam     Neuro/Psych   Anxiety Depression    negative neurological ROS     GI/Hepatic ,GERD  Medicated,,(+) Cirrhosis   Esophageal Varices and ascites  substance abuse  alcohol use  Endo/Other  diabetes, Type 2    Renal/GU Renal InsufficiencyRenal disease (Cr 1.89)     Musculoskeletal negative musculoskeletal ROS (+)    Abdominal   Peds  Hematology  (+) Blood dyscrasia (Hgb 6.6, Plt 52k (1u pRBCs running)), anemia   Anesthesia Other Findings Day of surgery medications reviewed with patient.  Reproductive/Obstetrics                             Anesthesia Physical Anesthesia Plan  ASA: 4  Anesthesia Plan: MAC   Post-op Pain Management: Minimal or no pain anticipated   Induction:   PONV Risk Score and Plan: 1 and Treatment may vary due to age or medical condition and Propofol infusion  Airway Management Planned: Natural Airway and Simple Face Mask  Additional Equipment: None  Intra-op Plan:   Post-operative Plan:   Informed Consent: I have reviewed the patients History and Physical, chart, labs and discussed the procedure including the risks, benefits and alternatives for the proposed anesthesia with the patient or authorized representative who has indicated his/her understanding and acceptance.       Plan Discussed with: CRNA  Anesthesia  Plan Comments:        Anesthesia Quick Evaluation

## 2023-05-09 NOTE — Brief Op Note (Signed)
05/09/2023  10:31 AM  PATIENT:  Martin Anthony  57 y.o. male  PRE-OPERATIVE DIAGNOSIS:  Cirrhosis with esophageal varices  POST-OPERATIVE DIAGNOSIS:  duodenum bx  PROCEDURE:  Procedure(s): ESOPHAGOGASTRODUODENOSCOPY (EGD) WITH PROPOFOL (N/A) BIOPSY  SURGEON:  Surgeons and Role:    * Audelia Knape, MD - Primary  Findings ------------- -EGD showed severe duodenitis with thickened folds, inflammation, friable tissue as well as contact oozing in the duodenal bulb as well as first portion of the duodenum.  Slow mucosal oozing from this area is likely source for patient's anemia.  Biopsies taken. -Small esophageal varices with stigmata of previous treatment.  Varices were not large enough for band ligation.  Recommendations -------------------------- -Protonix 40 mg IV twice a day while in hospital, switch to Protonix 40 mg p.o. twice a day for 4 weeks after discharge and then decrease it to Protonix 40 mg once a day -Avoid NSAIDs -Start full liquid diet and slowly advance to 2 g sodium diet as tolerated -Repeat labs in the morning -GI will follow  Kathi Der MD, FACP 05/09/2023, 10:33 AM  Contact #  [phone]

## 2023-05-09 NOTE — Op Note (Signed)
[facility] Patient Name: Martin Anthony Procedure Date: 05/09/2023 MRN: [id number] Attending MD: Kathi Der , MD, [id number] Date of Birth: 07-25-1965 CSN: [id number] Age: 57 Admit Type: Inpatient Procedure:                Upper GI endoscopy Indications:              Iron deficiency anemia, Follow-up of esophageal                            varices Providers:                Kathi Der, MD, Margaree Mackintosh, RN,                            Jacquelyn "Jaci" Clelia Croft, RN, Salley Scarlet,                            Technician Referring MD:              Medicines:                Sedation Administered by an Anesthesia Professional Complications:            No immediate complications. Estimated Blood Loss:     Estimated blood loss was minimal. Procedure:                Pre-Anesthesia Assessment:                           - Prior to the procedure, a History and Physical                            was performed, and patient medications and                            allergies were reviewed. The patient's tolerance of                            previous anesthesia was also reviewed. The risks                            and benefits of the procedure and the sedation                            options and risks were discussed with the patient.                            All questions were answered, and informed consent                            was obtained. Prior Anticoagulants: The patient has                            taken no anticoagulant or antiplatelet agents. ASA                            Grade Assessment:  IV - A patient with severe                            systemic disease that is a constant threat to life.                            After reviewing the risks and benefits, the patient                            was deemed in satisfactory condition to undergo the                            procedure.                           After obtaining informed  consent, the endoscope was                            passed under direct vision. Throughout the                            procedure, the patient's blood pressure, pulse, and                            oxygen saturations were monitored continuously. The                            [id number]-[id number] ([id number]) Olympus endoscope was introduced                            through the mouth, and advanced to the second part                            of duodenum. The upper GI endoscopy was                            accomplished without difficulty. The patient                            tolerated the procedure well. Scope In: Scope Out: Findings:      Two columns of small (< 5 mm) varices with no bleeding and no stigmata       of recent bleeding were found in the distal esophagus. No red wale signs       were present. Stigmata of prior treatment were evident. The varices       appeared smaller than they were at prior exam.      Moderate portal hypertensive gastropathy was found in the gastric body       and in the gastric antrum.      The cardia and gastric fundus were normal on retroflexion.      Segmental severe inflammation characterized by congestion (edema),       erosions, erythema and friability was found in the duodenal bulb and in       the first portion of the duodenum. Biopsies were taken with a  cold       forceps for histology.      The second portion of the duodenum was normal. Impression:               - Small (< 5 mm) esophageal varices with no                            bleeding and no stigmata of recent bleeding.                           - Portal hypertensive gastropathy.                           - Erosive nodular duodenitis. Biopsied. Bleeding                            likely from mucasal ozzing from duodenitis.                           - Normal second portion of the duodenum. Moderate Sedation:      Moderate (conscious) sedation was personally administered by an        anesthesia professional. The following parameters were monitored: oxygen       saturation, heart rate, blood pressure, and response to care. Recommendation:           - Return patient to hospital ward for ongoing care.                           - Full liquid diet.                           - Continue present medications.                           - Await pathology results. Procedure Code(s):        --- Professional ---                           [phone], Esophagogastroduodenoscopy, flexible,                            transoral; with biopsy, single or multiple Diagnosis Code(s):        --- Professional ---                           I85.00, Esophageal varices without bleeding                           K76.6, Portal hypertension                           K31.89, Other diseases of stomach and duodenum                           K29.80, Duodenitis without bleeding                           D50.9,  Iron deficiency anemia, unspecified CPT copyright 2022 [employer] Medical Association. All rights reserved. The codes documented in this report are preliminary and upon coder review may  be revised to meet current compliance requirements. Kathi Der, MD Kathi Der, MD 05/09/2023 10:31:29 AM Number of Addenda: 0

## 2023-05-09 NOTE — Progress Notes (Signed)
[facility] Gastroenterology Progress Note  Martin Anthony 57 y.o. 17-Oct-1965  CC: Anemia, abdominal pain, cirrhosis   Subjective: Patient seen and examined at bedside.  Continues to have abdominal pain.  Had some dark-colored stool.  Denies prior to blood in the ostomy.  ROS : afebrile, negative for chest pain   Objective: Vital signs in last 24 hours: Vitals:   05/09/23 0853 05/09/23 0941  BP: 110/63 (!) 119/58  Pulse: 62 (!) 58  Resp:  10  Temp: 98.4 F (36.9 C) 98.1 F (36.7 C)  SpO2: 98% 100%    Physical Exam:  General:   Alert,  Well-developed, well-nourished, pleasant and cooperative in NAD Scleral icterus noted Lungs: No visible respiratory distress Heart:  Regular rate and rhythm; no murmurs, clicks, rubs,  or gallops. Abdomen: Soft, nontender, nondistended, bowel sound present, midline ostomy in place.  No peritoneal signs.  No significant ascites on exam No lower extremity edema Mood and affect normal Alert and oriented x 3   Lab Results: Recent Labs    05/08/23 0604 05/09/23 0513  NA 136 129*  K 3.6 3.4*  CL 111 106  CO2 19* 19*  GLUCOSE 155* 130*  BUN 25* 25*  CREATININE 1.87* 1.89*  CALCIUM 8.2* 7.7*  MG  --  1.6*  PHOS  --  3.9   Recent Labs    05/07/23 1852 05/09/23 0513  AST 42* 38  ALT 20 18  ALKPHOS 136* 69  BILITOT 3.6* 2.7*  PROT 7.8 6.0*  ALBUMIN 2.5* 2.0*   Recent Labs    05/07/23 1852 05/08/23 1300 05/09/23 0513  WBC 2.5* 3.3* 1.7*  NEUTROABS 1.8 2.7  --   HGB 6.6* 8.2* 6.6*  HCT 20.3* 25.4* 20.4*  MCV 102.5* 102.0* 102.5*  PLT 79* 72* 52*   Recent Labs    05/07/23 1852 05/09/23 0753  LABPROT 19.9* 21.1*  INR 1.7* 1.8*      Assessment/Plan: -Anemia with hemoglobin of 6.6 in a patient with history of cirrhosis complicated by esophageal varices requiring band ligation last month.  No overt bleeding at this time. -Worsening pancytopenia -Abdominal pain.  Mild elevated lipase.  CT abdomen pelvis with IV contrast  yesterday showed findings compatible with duodenitis. -Ascites.  Had paracentesis last admission.  Fluid analysis was negative for SBP. -History of colon cancer status post right hemicolectomy with end ileostomy. -Chronic kidney disease   Recommendations --------------------------- -With ongoing drop in hemoglobin.  Receiving blood transfusion.  Has worsening pancytopenia.  -  Proceed with EGD today -Change Protonix to IV twice daily -Continue furosemide, spironolactone, and rifaximin.  Risks (bleeding, infection, bowel perforation that could require surgery, sedation-related changes in cardiopulmonary systems), benefits (identification and possible treatment of source of symptoms, exclusion of certain causes of symptoms), and alternatives (watchful waiting, radiographic imaging studies, empiric medical treatment)  were explained to patient/family in detail and patient wishes to proceed.    Kathi Der MD, FACP 05/09/2023, 10:10 AM  Contact #  [phone]

## 2023-05-09 NOTE — Transfer of Care (Signed)
Immediate Anesthesia Transfer of Care Note  Patient: Martin Anthony  Procedure(s) Performed: ESOPHAGOGASTRODUODENOSCOPY (EGD) WITH PROPOFOL BIOPSY  Patient Location: PACU  Anesthesia Type:MAC  Level of Consciousness: awake, alert , oriented, and patient cooperative  Airway & Oxygen Therapy: Patient Spontanous Breathing and Patient connected to nasal cannula oxygen  Post-op Assessment: Report given to RN and Post -op Vital signs reviewed and stable  Post vital signs: Reviewed and stable  Last Vitals:  Vitals Value Taken Time  BP    Temp    Pulse 54 05/09/23 1033  Resp 8 05/09/23 1033  SpO2 100 % 05/09/23 1033  Vitals shown include unfiled device data.  Last Pain:  Vitals:   05/09/23 0941  TempSrc: Temporal  PainSc: 8       Patients Stated Pain Goal: 2 (05/08/23 0818)  Complications: No notable events documented.

## 2023-05-09 NOTE — Progress Notes (Signed)
HOSPITALIST ROUNDING NOTE SYLYS SOBIERAJ WUJ:[id number]  DOB: 07-06-1965  DOA: 05/07/2023  PCP: Nelwyn Salisbury, MD  05/09/2023,11:58 AM   LOS: 2 days      Code Status: full  From:  home    Current Dispo: home     57 year old male known A-fib CHADVASC >4 not on anticoagulation Prior EtOH DM TY 2 peripheral neuropathy CKD 3 Previous high-grade tubular adenoma Liver cirrhosis presumed EtOH with large esophageal varices since at least 2020-first found 01/19/2019 by Dr. Elnoria Howard Subsequent hospitalization 2022 with AKI secondary to rhabdo and being found down and was on the verge of dialysis but he improved to the point that he could go home hemicolectomy for pT3 N0 stage II-III ascending colon cancer with ileostomy [place] 10/25/2021 Hospitalized 10/8--10/24 blood in ileostomy bag hemoglobin 6.9 CT showed inflammatory stranding duodenitis no active bleeding on angiogram--rx octreotide Protonix Xifaxan-EGD showed grade 3 varices that were banded --COmplication of agitated delirium 03/25/2023 and Precedex was started 10/11 and patient went to the ICU--palliative care consulted MELD 24-not a candidate for transplant-he had ascites SBP r/o paracentesis X2--it was felt that he might have a liver mass additionally and that we will need a triple phase follow-up CT discharged home with 2 weeks of antibiotics for TEE negative questionable endocarditis and went home with hemoglobin of 8  Went to PCP office for reducible abdominal wall hernia with worsening abdominal pain much worse after eating 10/10 pain ostomy functioning-was referred by PCP--> Dr. Sherlynn Stalls to evaluate the hernia  Labs showed a drop in hemoglobin from 8-6.4 and he was referred by PCP to ED Sodium 130 potassium 3.2 CO2 17 BUN/creatinine 28/1.9 (baseline about 2) Alk phos 136 lipase 374 AST/ALT 42/20 WBC 2.5 hemoglobin 6.6 MCV 102 platelet 79--INR 1.7 [name] ABD advanced cirrhosis no focal intrahepatic mass moderate splenomegaly ascites with portal  venous hypertension marked gallbladder wall thickening no sludge stones no sonographic evidence of acute cholecystitis 11/23 CT scan shows marked circumferential second portion duodenal inflammation cirrhosis with portal hypertension and small volume ascites nonobstructing calculi right colectomy changes moderate right-sided inguinal hernia with ascites  Attempt at paracentesis unsuccessful Endoscopy 11/24 = less than 5 mm esophageal varices no bleeding GAVE erosive nodular duodenitis mucosal bleeding from this 11/24 transfused 2 units PRBC  Plan  Erosive duodenitis causing slow GI bleed Continue clear liquid diet-for now continue Protonix 40 daily--follow-up pathology See below--- continues on empiric ceftriaxone at this time  Anemia of acute blood loss Probably from duodenitis-transfused on admission-transfusing again 2 units PRBC  avoid NSAIDs etc. Await resolution  SBP  Ultrasound paracentesis unsuccessful-continue ceftriaxone empirically for now-retrial in a.m.-Daily weights  Pancreatitis? Lipase elevated 317 imaging does not show pancreatitis this is secondary to duodenitis as per GI Pain will need to be controlled with oral meds we will taper off of PCA in the next 24 hours  Liver cirrhosis MELD score about 24 with additional pancytopenia secondary to this Continue Aldactone 50 Lasix 20--might change to nonselective BB-not enough fluid for paracentesis Continue midodrine for chronic hypotension 2.5 3 times daily ---continue rifaximin 550 twice daily to prevent encephalopathy Pancytopenia slightly worse and his platelets but dropped to the 50s-we would get an ANC in the morning as he may require neutropenic precautions Threshold low for repeat attempt at paracentesis  pT3 N0 colon cancer with ileostomy 10/25/2021 Inguinal hernias probably nonoperable Sounds like he is not a candidate for further surgery-defer to outpatient surgeons-outpatient follow-up with oncologist  DM TY 2  complication of  neuropathy Continue gabapentin 300 at bedtime and 100 twice daily meal  A-fib CHADVASC >4 has bled score >3 Not a candidate for anticoagulation-on monitors is sinus rhythm Currently on Coreg may need to switch back to nadolol as above  Underlying CKD 3  Goals of care Is still full code has been seen by palliative care previously-May need to revisit this discussion in the outpatient setting   DVT prophylaxis: SCD  Status is: Inpatient Remains inpatient appropriate because:   Requires scope and further care      Subjective:  Pain is moderate-he is in no great distress He is trying to eat grits-passing some unformed stool with little bit of blood in it not grossly black however No fever no chills  Objective + exam Vitals:   05/09/23 1040 05/09/23 1050 05/09/23 1100 05/09/23 1156  BP: (!) 118/51 (!) 117/56 (!) 118/57 128/63  Pulse: (!) 55 (!) 57 (!) 57 62  Resp: 10 18 13 14   Temp:    98.8 F (37.1 C)  TempSrc:    Oral  SpO2: 100% 100% 100% 100%  Weight:      Height:       Filed Weights   05/07/23 1829 05/07/23 2339 05/09/23 0941  Weight: 78 kg 77.8 kg 77.1 kg    Examination:  EOMI NCAT no focal deficit No icterus no pallor Chest is clear no wheeze Abdomen is tender in epigastrium No lower extremity edema Neuro intact grossly  Data Reviewed: reviewed   CBC    Component Value Date/Time   WBC 1.7 (L) 05/09/2023 0513   RBC 1.99 (L) 05/09/2023 0513   HGB 6.6 (LL) 05/09/2023 0513   HCT 20.4 (L) 05/09/2023 0513   PLT 52 (L) 05/09/2023 0513   MCV 102.5 (H) 05/09/2023 0513   MCH 33.2 05/09/2023 0513   MCHC 32.4 05/09/2023 0513   RDW 16.3 (H) 05/09/2023 0513   LYMPHSABS 0.3 (L) 05/08/2023 1300   MONOABS 0.3 05/08/2023 1300   EOSABS 0.0 05/08/2023 1300   BASOSABS 0.0 05/08/2023 1300      Latest Ref Rng & Units 05/09/2023    5:13 AM 05/08/2023    6:04 AM 05/07/2023    6:52 PM  CMP  Glucose 70 - 99 mg/dL 846  962  952   BUN 6 - 20 mg/dL  25  25  28    Creatinine 0.61 - 1.24 mg/dL 8.41  3.24  4.01   Sodium 135 - 145 mmol/L 129  136  130   Potassium 3.5 - 5.1 mmol/L 3.4  3.6  3.2   Chloride 98 - 111 mmol/L 106  111  105   CO2 22 - 32 mmol/L 19  19  17    Calcium 8.9 - 10.3 mg/dL 7.7  8.2  8.4   Total Protein 6.5 - 8.1 g/dL 6.0   7.8   Total Bilirubin <1.2 mg/dL 2.7   3.6   Alkaline Phos 38 - 126 U/L 69   136   AST 15 - 41 U/L 38   42   ALT 0 - 44 U/L 18   20      Scheduled Meds:  sodium chloride   Intravenous Once   sodium chloride   Intravenous Once   carvedilol  3.125 mg Oral BID WC   feeding supplement  237 mL Oral TID BM   ferrous sulfate  325 mg Oral Q breakfast   folic acid  1 mg Oral Daily   furosemide  20 mg Intravenous Once  furosemide  20 mg Oral Daily   gabapentin  100 mg Oral BID   gabapentin  300 mg Oral QHS   HYDROmorphone   Intravenous Q4H   insulin aspart  0-15 Units Subcutaneous TID WC   midodrine  2.5 mg Oral TID WC   multivitamin with minerals  1 tablet Oral Daily   nicotine  7 mg Transdermal Daily   nystatin  1 Application Topical TID   pantoprazole (PROTONIX) IV  40 mg Intravenous Q12H   rifaximin  550 mg Oral BID   sertraline  50 mg Oral Daily   spironolactone  50 mg Oral Daily   thiamine  100 mg Oral Daily   Continuous Infusions:  cefTRIAXone (ROCEPHIN)  IV Stopped (05/09/23 0700)    Time  50  Rhetta Mura, MD  [facility]

## 2023-05-09 NOTE — Anesthesia Postprocedure Evaluation (Signed)
Anesthesia Post Note  Patient: Martin Anthony  Procedure(s) Performed: ESOPHAGOGASTRODUODENOSCOPY (EGD) WITH PROPOFOL BIOPSY     Patient location during evaluation: PACU Anesthesia Type: MAC Level of consciousness: awake and alert Pain management: pain level controlled Vital Signs Assessment: post-procedure vital signs reviewed and stable Respiratory status: spontaneous breathing, nonlabored ventilation and respiratory function stable Cardiovascular status: blood pressure returned to baseline Postop Assessment: no apparent nausea or vomiting Anesthetic complications: no   No notable events documented.  Last Vitals:  Vitals:   05/09/23 0941 05/09/23 1030  BP: (!) 119/58 (!) 106/51  Pulse: (!) 58 (!) 57  Resp: 10 10  Temp: 36.7 C (!) 36.3 C  SpO2: 100% 100%    Last Pain:  Vitals:   05/09/23 1030  TempSrc: Temporal  PainSc: 0-No pain                 Shanda Howells

## 2023-05-10 DIAGNOSIS — R109 Unspecified abdominal pain: Secondary | ICD-10-CM | POA: Diagnosis not present

## 2023-05-10 LAB — CBC
HCT: 23.2 % — ABNORMAL LOW (ref 39.0–52.0)
Hemoglobin: 7.8 g/dL — ABNORMAL LOW (ref 13.0–17.0)
MCH: 33.2 pg (ref 26.0–34.0)
MCHC: 33.6 g/dL (ref 30.0–36.0)
MCV: 98.7 fL (ref 80.0–100.0)
Platelets: 47 10*3/uL — ABNORMAL LOW (ref 150–400)
RBC: 2.35 MIL/uL — ABNORMAL LOW (ref 4.22–5.81)
RDW: 16.3 % — ABNORMAL HIGH (ref 11.5–15.5)
WBC: 1.6 10*3/uL — ABNORMAL LOW (ref 4.0–10.5)
nRBC: 0 % (ref 0.0–0.2)

## 2023-05-10 LAB — CBC WITH DIFFERENTIAL/PLATELET
Abs Immature Granulocytes: 0.01 10*3/uL (ref 0.00–0.07)
Basophils Absolute: 0 10*3/uL (ref 0.0–0.1)
Basophils Relative: 1 %
Eosinophils Absolute: 0 10*3/uL (ref 0.0–0.5)
Eosinophils Relative: 2 %
HCT: 23.5 % — ABNORMAL LOW (ref 39.0–52.0)
Hemoglobin: 7.8 g/dL — ABNORMAL LOW (ref 13.0–17.0)
Immature Granulocytes: 1 %
Lymphocytes Relative: 18 %
Lymphs Abs: 0.3 10*3/uL — ABNORMAL LOW (ref 0.7–4.0)
MCH: 32.5 pg (ref 26.0–34.0)
MCHC: 33.2 g/dL (ref 30.0–36.0)
MCV: 97.9 fL (ref 80.0–100.0)
Monocytes Absolute: 0.2 10*3/uL (ref 0.1–1.0)
Monocytes Relative: 14 %
Neutro Abs: 1 10*3/uL — ABNORMAL LOW (ref 1.7–7.7)
Neutrophils Relative %: 64 %
Platelets: 48 10*3/uL — ABNORMAL LOW (ref 150–400)
RBC: 2.4 MIL/uL — ABNORMAL LOW (ref 4.22–5.81)
RDW: 16.1 % — ABNORMAL HIGH (ref 11.5–15.5)
WBC: 1.5 10*3/uL — ABNORMAL LOW (ref 4.0–10.5)
nRBC: 0 % (ref 0.0–0.2)

## 2023-05-10 LAB — COMPREHENSIVE METABOLIC PANEL
ALT: 16 U/L (ref 0–44)
AST: 40 U/L (ref 15–41)
Albumin: 2 g/dL — ABNORMAL LOW (ref 3.5–5.0)
Alkaline Phosphatase: 73 U/L (ref 38–126)
Anion gap: 3 — ABNORMAL LOW (ref 5–15)
BUN: 27 mg/dL — ABNORMAL HIGH (ref 6–20)
CO2: 20 mmol/L — ABNORMAL LOW (ref 22–32)
Calcium: 8.1 mg/dL — ABNORMAL LOW (ref 8.9–10.3)
Chloride: 110 mmol/L (ref 98–111)
Creatinine, Ser: 1.97 mg/dL — ABNORMAL HIGH (ref 0.61–1.24)
GFR, Estimated: 39 mL/min — ABNORMAL LOW (ref >60)
Glucose, Bld: 167 mg/dL — ABNORMAL HIGH (ref 70–99)
Potassium: 3.7 mmol/L (ref 3.5–5.1)
Sodium: 133 mmol/L — ABNORMAL LOW (ref 135–145)
Total Bilirubin: 2.4 mg/dL — ABNORMAL HIGH (ref <1.2)
Total Protein: 5.9 g/dL — ABNORMAL LOW (ref 6.5–8.1)

## 2023-05-10 LAB — MAGNESIUM: Magnesium: 1.9 mg/dL (ref 1.7–2.4)

## 2023-05-10 LAB — GLUCOSE, CAPILLARY
Glucose-Capillary: 150 mg/dL — ABNORMAL HIGH (ref 70–99)
Glucose-Capillary: 127 mg/dL — ABNORMAL HIGH (ref 70–99)
Glucose-Capillary: 135 mg/dL — ABNORMAL HIGH (ref 70–99)
Glucose-Capillary: 149 mg/dL — ABNORMAL HIGH (ref 70–99)

## 2023-05-10 LAB — PHOSPHORUS: Phosphorus: 4 mg/dL (ref 2.5–4.6)

## 2023-05-10 MED ORDER — CIPROFLOXACIN HCL 500 MG PO TABS
500.0000 mg | ORAL_TABLET | Freq: Two times a day (BID) | ORAL | Status: DC
  Administered 2023-05-10 – 2023-05-12 (×5): 500 mg via ORAL
  Filled 2023-05-10 (×5): qty 1

## 2023-05-10 NOTE — Progress Notes (Signed)
HOSPITALIST ROUNDING NOTE Martin Anthony ZOX:[id number]  DOB: Jul 24, 1965  DOA: 05/07/2023  PCP: Nelwyn Salisbury, MD  05/10/2023,9:10 AM   LOS: 3 days      Code Status: full  From:  home    Current Dispo: home     57 year old male known A-fib CHADVASC >4 not on anticoagulation Prior EtOH DM TY 2 peripheral neuropathy CKD 3 Previous high-grade tubular adenoma Liver cirrhosis presumed EtOH with large esophageal varices since at least 2020-first found 01/19/2019 by Dr. Elnoria Howard Subsequent hospitalization 2022 with AKI secondary to rhabdo and being found down and was on the verge of dialysis but he improved to the point that he could go home hemicolectomy for pT3 N0 stage II-III ascending colon cancer with ileostomy [place] 10/25/2021 Hospitalized 10/8--10/24 blood in ileostomy bag hemoglobin 6.9 CT showed inflammatory stranding duodenitis no active bleeding on angiogram--rx octreotide Protonix Xifaxan-EGD showed grade 3 varices that were banded --COmplication of agitated delirium 03/25/2023 and Precedex was started 10/11 and patient went to the ICU--palliative care consulted MELD 24-not a candidate for transplant-he had ascites SBP r/o paracentesis X2--it was felt that he might have a liver mass additionally and that we will need a triple phase follow-up CT discharged home with 2 weeks of antibiotics for TEE negative questionable endocarditis and went home with hemoglobin of 8  Went to PCP office for reducible abdominal wall hernia with worsening abdominal pain much worse after eating 10/10 pain ostomy functioning-was referred by PCP--> Dr. Sherlynn Stalls to evaluate the hernia   hemoglobin from 8-6.4 and he was referred by PCP to ED Sodium 130 potassium 3.2 CO2 17 BUN/creatinine 28/1.9 (baseline about 2) Alk phos 136 lipase 374 AST/ALT 42/20 WBC 2.5 hemoglobin 6.6 MCV 102 platelet 79--INR 1.7 [name] ABD advanced cirrhosis no focal intrahepatic mass moderate splenomegaly ascites with portal venous hypertension  marked gallbladder wall thickening no sludge stones no sonographic evidence of acute cholecystitis 11/23 CT scan shows marked circumferential second portion duodenal inflammation cirrhosis with portal hypertension and small volume ascites nonobstructing calculi right colectomy changes moderate right-sided inguinal hernia with ascites  Attempt at paracentesis unsuccessful 11/24  endoscopy= less than 5 mm esophageal varices no bleeding GAVE erosive nodular duodenitis mucosal bleeding from this 11/24 transfused 2 units PRBC  Plan  Erosive duodenitis causing slow GI bleed For now on liquid diet-gradually very slowly-for now continue Protonix 40 daily--pathology 11/24 still pending  Anemia of acute blood loss secondary to duodenitis 3 units transfused hemoglobin threshold transfusion = 7 Still having some dark stool in ostomy and will need characterization if becomes black again Not stable for discharge  SBP  Ultrasound paracentesis unsuccessful-watch daily for need for trial Transition ceftriaxone p.o. ciprofloxacin EOT 11/27 ( 5 days) Weight stable at 77 kg Continues on Aldactone 50, Lasix 20, midodrine 2.5 3 times daily  Pancreatitis? Lipase elevated 317 imaging does not show pancreatitis this is secondary to duodenitis as per GI Pain controlled on oral meds Oxy IR 5 every 4 as needed no need for Dilaudid discontinue  Liver cirrhosis MELD score about 24 with additional pancytopenia secondary to this Continue Aldactone 50 Lasix 20---Continue midodrine for chronic hypotension 2.5 3 times daily ---continue rifaximin 550 twice daily to prevent encephalopathy Get INR I--- if above 2 would reversed with vitamin K to slow oozing  pT3 N0 colon cancer with ileostomy 10/25/2021 Inguinal hernias probably nonoperable not a candidate for further surgery-defer to outpatient surgeons-outpatient follow-up with oncologist  DM TY 2 complication of neuropathy Continue gabapentin 300  at bedtime and 100  twice daily meal  A-fib CHADVASC >4 has bled score >3 Not a candidate for anticoagulation-on monitors is sinus rhythm Currently on Coreg may need to switch back to nadolol as above  Underlying CKD 3  Goals of care Is still full code has been seen by palliative care previously-May need to revisit this discussion in the outpatient setting   DVT prophylaxis: SCD  Status is: Inpatient Remains inpatient appropriate because:   Improved overall awaiting stability of hemoglobin    Subjective:  Doing better on soft diet had some severe pain with cheeseburger yesterday Does not feel much more swollen than usual Stool in bag remains dark but not black--I think he can shower he looks pretty stable  Objective + exam Vitals:   05/09/23 1335 05/09/23 1558 05/09/23 1946 05/10/23 0424  BP: 113/69 123/68 112/67 115/64  Pulse: 60 (!) 56 (!) 58 63  Resp: 14     Temp: 97.6 F (36.4 C) 97.9 F (36.6 C) 97.9 F (36.6 C) 98.4 F (36.9 C)  TempSrc: Oral Oral Oral Oral  SpO2: 99% 100% 100% 100%  Weight:      Height:       Filed Weights   05/07/23 1829 05/07/23 2339 05/09/23 0941  Weight: 78 kg 77.8 kg 77.1 kg    Examination:  Awake coherent younger than stated age No icterus no pallor S1-S2 no murmur telemetry benign Abdomen distended but about the same as prior cannot really appreciate shifting dullness no rebound no guarding ostomy in place did not visualize stool No lower extremity edema No icterus no pallor no tremor  Data Reviewed: reviewed   CBC    Component Value Date/Time   WBC 1.6 (L) 05/10/2023 0419   RBC 2.35 (L) 05/10/2023 0419   HGB 7.8 (L) 05/10/2023 0419   HCT 23.2 (L) 05/10/2023 0419   PLT 47 (L) 05/10/2023 0419   MCV 98.7 05/10/2023 0419   MCH 33.2 05/10/2023 0419   MCHC 33.6 05/10/2023 0419   RDW 16.3 (H) 05/10/2023 0419   LYMPHSABS 0.3 (L) 05/08/2023 1300   MONOABS 0.3 05/08/2023 1300   EOSABS 0.0 05/08/2023 1300   BASOSABS 0.0 05/08/2023 1300       Latest Ref Rng & Units 05/10/2023    4:19 AM 05/09/2023    5:13 AM 05/08/2023    6:04 AM  CMP  Glucose 70 - 99 mg/dL 161  096  045   BUN 6 - 20 mg/dL 27  25  25    Creatinine 0.61 - 1.24 mg/dL 4.09  8.11  9.14   Sodium 135 - 145 mmol/L 133  129  136   Potassium 3.5 - 5.1 mmol/L 3.7  3.4  3.6   Chloride 98 - 111 mmol/L 110  106  111   CO2 22 - 32 mmol/L 20  19  19    Calcium 8.9 - 10.3 mg/dL 8.1  7.7  8.2   Total Protein 6.5 - 8.1 g/dL 5.9  6.0    Total Bilirubin <1.2 mg/dL 2.4  2.7    Alkaline Phos 38 - 126 U/L 73  69    AST 15 - 41 U/L 40  38    ALT 0 - 44 U/L 16  18       Scheduled Meds:  sodium chloride   Intravenous Once   carvedilol  3.125 mg Oral BID WC   ciprofloxacin  500 mg Oral BID   feeding supplement  237 mL Oral TID BM  ferrous sulfate  325 mg Oral Q breakfast   folic acid  1 mg Oral Daily   furosemide  20 mg Oral Daily   gabapentin  100 mg Oral BID   gabapentin  300 mg Oral QHS   insulin aspart  0-15 Units Subcutaneous TID WC   midodrine  2.5 mg Oral TID WC   multivitamin with minerals  1 tablet Oral Daily   nicotine  7 mg Transdermal Daily   nystatin  1 Application Topical TID   pantoprazole (PROTONIX) IV  40 mg Intravenous Q12H   rifaximin  550 mg Oral BID   sertraline  50 mg Oral Daily   spironolactone  50 mg Oral Daily   sucralfate  1 g Oral TID WC & HS   thiamine  100 mg Oral Daily   Continuous Infusions:   Time  20  Rhetta Mura, MD  [facility]

## 2023-05-10 NOTE — Progress Notes (Signed)
Martin Anthony 3:27 PM  Subjective: Patient seen and examined in his hospital computer chart reviewed and his case discussed with my partner Dr. Levora Angel and we reviewed his yesterday's endoscopy and his pain seems to come and go its mostly in the right upper quadrant and he is tolerating his diet although he did not wanted advanced yet and [place]ologist there and he has no signs of bleeding and no new complaints  Objective: Vital signs stable afebrile no acute distress abdomen does have a little bit of right upper quadrant tenderness around his ostomy bag no guarding or rebound BUN and creatinine stable hemoglobin stable CT without significant findings other than the duodenum which was not too bad on the endoscopy  Assessment: Cirrhosis and colon cancer  Plan: Tomorrow you should be able to advance his diet and hopefully he will be able to go home soon and please call me this week if I can be of any further assistance with this hospital stay [place]  [facility]  office [phone] After 5PM or if no answer call [phone]

## 2023-05-10 NOTE — Progress Notes (Signed)
Mobility Specialist - Progress Note  Pre-mobility: 63 bpm HR, 96% SpO2 During mobility: 77 bpm HR, 99% SpO2 Post-mobility: 78 bpm HR, 100% SPO2   05/10/23 0946  Mobility  Activity Ambulated independently in hallway  Level of Assistance Independent  Assistive Device None  Distance Ambulated (ft) 460 ft  Range of Motion/Exercises Active  Activity Response Tolerated well  Mobility Referral Yes  $Mobility charge 1 Mobility  Mobility Specialist Start Time (ACUTE ONLY) 0935  Mobility Specialist Stop Time (ACUTE ONLY) 0946  Mobility Specialist Time Calculation (min) (ACUTE ONLY) 11 min   Pt was found in bed and agreeable to ambulate. Stated feeling a little unsteady from not being up. At EOS returned to sit EOB with all needs met. Call bell in reach.  Billey Chang Mobility Specialist

## 2023-05-10 NOTE — TOC Initial Note (Signed)
Transition of Care [facility]) - Initial/Assessment Note    Patient Details  Name: Martin Anthony MRN: [id number] Date of Birth: Feb 06, 1966  [facility] CM/SW Contact:    Lanier Clam, RN Phone Number: 05/10/2023, 4:28 PM  Clinical Narrative:  d/c plan home. Monitor for d/c needs.                 Expected Discharge Plan: Home/Self Care Barriers to Discharge: Continued Medical Work up   Patient Goals and CMS Choice Patient states their goals for this hospitalization and ongoing recovery are:: Home CMS Medicare.gov Compare Post Acute Care list provided to:: Patient Choice offered to / list presented to : Patient Christine ownership interest in [facility].provided to:: Patient    Expected Discharge Plan and Services   Discharge Planning Services: CM Consult                                          Prior Living Arrangements/Services   Lives with:: Self                   Activities of Daily Living   ADL Screening (condition at time of admission) Independently performs ADLs?: Yes (appropriate for developmental age) Is the patient deaf or have difficulty hearing?: No Does the patient have difficulty seeing, even when wearing glasses/contacts?: No Does the patient have difficulty concentrating, remembering, or making decisions?: No  Permission Sought/Granted                  Emotional Assessment              Admission diagnosis:  Abdominal pain in male [R10.9] Gastrointestinal hemorrhage, unspecified gastrointestinal hemorrhage type [K92.2] Patient Active Problem List   Diagnosis Date Noted   PAF (paroxysmal atrial fibrillation) (HCC) 05/07/2023   Abdominal pain in male 05/07/2023   Protein calorie malnutrition (HCC) 05/07/2023   Abdominal pain 04/05/2023   Liver mass 03/31/2023   Hyponatremia 03/31/2023   Streptococcal bacteremia 03/30/2023   Alcohol withdrawal delirium (HCC) 03/30/2023   Thrombocytopenia (HCC)  03/30/2023   Shock (HCC) 03/30/2023   Esophageal varices with bleeding (HCC) 03/23/2023   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   Alcoholic cirrhosis of liver with ascites (HCC) 12/05/2021   Type 2 diabetes mellitus with neurological complications (HCC) 12/05/2021   Primary adenocarcinoma of ascending colon (HCC) 12/05/2021   CKD (chronic kidney disease) stage 3, GFR 30-59 ml/min (HCC) 12/05/2021   Pancytopenia (HCC) 12/05/2021   Right inguinal hernia 12/05/2021   Acute renal failure superimposed on stage 2 chronic kidney disease (HCC) 05/12/2021   Rhabdomyolysis 05/12/2021   Elevated LFTs 05/12/2021   Acute encephalopathy 05/12/2021   Ascites    Anemia 01/18/2019   Alcohol abuse 06/25/2015   Gout 10/03/2013   HYPERLIPIDEMIA 05/20/2007   Depression with anxiety 05/20/2007   Essential hypertension 05/20/2007   GERD 05/20/2007   PCP:  Nelwyn Salisbury, MD Pharmacy:   [facility] ORDER) ELECTRON[facility] - [place], [place] - [address] [place] [address] Phone: [phone] Fax: [phone]  [id number] - [place] [facility] - [address] [place] [address] Phone: [phone] Fax: [phone]     Social Determinants of Health (SDOH) Social History: SDOH Screenings   Food Insecurity: No Food Insecurity (05/08/2023)  Recent Concern: Food Insecurity - Food Insecurity Present (03/23/2023)  Housing:  High Risk (05/08/2023)  Transportation Needs: No Transportation Needs (05/08/2023)  Recent Concern: Transportation Needs - Unmet Transportation Needs (03/23/2023)  Utilities: Not At Risk (05/08/2023)  Alcohol Screen: High Risk (12/05/2021)  Depression (PHQ2-9): High Risk (02/02/2023)  Financial Resource Strain: Low Risk  (10/06/2022)   Received from [facility], [facility], [facility], [facility] [place] [facility]  Physical Activity: Unknown (12/05/2021)  Social Connections: Unknown (08/11/2022)    Received from [facility], [facility]  Stress: Stress Concern Present (12/05/2021)  Tobacco Use: Medium Risk (05/07/2023)  Health Literacy: Adequate Health Literacy (11/26/2021)   Received from [facility] [place], [facility]  Recent Concern: Health Literacy - Inadequate Health Literacy (11/01/2021)   Received from [facility] [place], [facility] [place]   SDOH Interventions:     Readmission Risk Interventions     No data to display

## 2023-05-10 NOTE — Progress Notes (Signed)
Nutrition Follow-up  DOCUMENTATION CODES:   Non-severe (moderate) malnutrition in context of chronic illness  INTERVENTION:  - Full Liquid diet per MD. Advance as tolerated.  - Ensure Plus High Protein po TID, each supplement provides 350 kcal and 20 grams of protein. - Encourage intake as tolerated.  - Multivitamin with minerals daily, folic acid, and thiamine. - Monitor weight trends.     NUTRITION DIAGNOSIS:   Moderate Malnutrition related to chronic illness (advanced cirrhosis; CKD 3) as evidenced by moderate fat depletion, moderate muscle depletion, percent weight loss (9% in 1 month). *new  GOAL:   Patient will meet greater than or equal to 90% of their needs *progressing  MONITOR:   PO intake, Supplement acceptance, Diet advancement, Weight trends  REASON FOR ASSESSMENT:   Consult Poor PO, Calorie Count  ASSESSMENT:   57 year old male with PMHx of advanced cirrhosis with portal hypertension and multiple varices, ascites, pancytopenia, adenocarcinoma of the colon s/p hemicolectomy with creation of ileostomy, diet controlled DM, HTN, GERD, CKD stage 3, peripheral neuropathy, reducible abdominal wall hernia recent admission 03/23/23 related to major GI bleed secondary to variceal bleed s/p banding on 03/29/23 with repeat bleed and shock requiring ICU admission complicated by encephalopathy, strepticoccal bacteremia with negative TEE for endocarditis requiring two weeks of oral antibiotics. Now admitted with one week history of severe abdominal pain, also with concern for malnutrition due to poor appetite and recent weight loss.  Patient finishing breakfast at time of visit. Noted to have consumed almost all of tray with only thing left being pudding (which patient reports he plans to eat).  He endorses a UBW of 185# and weight loss beginning around 1 month ago due to being unable to eat very much as a result of intense pain with eating.  Per EMR, patient weighed at 187# on  10/23 and now weighed at 170#. This is a 17# or 9% weight loss in 1 month, which is significant and severe for the time frame.   Patient shares he used to eat 2.5 meals a day but over the past 6 weeks to 2 months he has been eating 3 very small portioned meals. Drinking 1 Ensure about 4-5 times a week. Admits appetite has been poor as there is so much pain associated with eating.   His pain is a little better than it has been but he feels this is likely because he has only been on liquids since admission. Appetite still decreased but patient feels he would like to try more, although is weary to do so.  Calorie count ordered over the weekend with results below:  Day 1: Breakfast: NPO Lunch: 100% = 381 kcal, 1g protein Dinner: 100% = 436 kcal, 12g protein Supplements: 2 Ensure Plus High Protein (700 kcal, 40g protein) Snacks: 2 italian ice = 140 kcal, 0g protein Total intake: 1657 kcal (79% of minimum estimated needs)  53 protein (50% of minimum estimated needs)  Day 1: Breakfast: NPO Lunch: 100% = 1136 kcal, 26g protien Dinner: 100% = 1360 kcal, 29g protein Supplements: 1 Ensure Plus High Protein = 350 kcal, 20g protein Total intake: 2846 kcal (>100% of minimum estimated needs)  75 protein (71% of minimum estimated needs)  Average: 2251 kcals (100% needs), and 64g protein (61% needs).  Patient eating very well despite restrictive diet.Will discontinue calorie count. RD will continue to follow.    Medications reviewed and include: 325mg  ferrous sulfate, 1mg  folic acid, Lasix, MVI, Thiamine  Labs reviewed:  Na 133  Creatinine 1.97 HA1C 5.0   NUTRITION - FOCUSED PHYSICAL EXAM:  Flowsheet Row Most Recent Value  Orbital Region Mild depletion  Upper Arm Region Moderate depletion  Thoracic and Lumbar Region Mild depletion  Buccal Region Moderate depletion  Temple Region Moderate depletion  Clavicle Bone Region Mild depletion  Clavicle and Acromion Bone Region Moderate  depletion  Scapular Bone Region Unable to assess  Dorsal Hand Mild depletion  Patellar Region Moderate depletion  Anterior Thigh Region Moderate depletion  Posterior Calf Region Moderate depletion  Edema (RD Assessment) None  Hair Reviewed  Eyes Reviewed  Mouth Reviewed  Skin Reviewed  Nails Reviewed       Diet Order:   Diet Order             Diet full liquid Fluid consistency: Thin  Diet effective now                   EDUCATION NEEDS:  Education needs have been addressed  Skin:  Skin Assessment: Reviewed RN Assessment  Last BM:  11/25 - ileostomy  Height:  Ht Readings from Last 1 Encounters:  05/09/23 6\' 4"  (1.93 m)   Weight:  Wt Readings from Last 1 Encounters:  05/09/23 77.1 kg   Ideal Body Weight:  91.8 kg  BMI:  Body mass index is 20.69 kg/m.  Estimated Nutritional Needs:  Kcal:  2100-2300 Protein:  105-115 grams Fluid:  2.1-2.3 L/day    Shelle Iron RD, LDN For contact information, refer to [facility] '

## 2023-05-11 ENCOUNTER — Encounter (HOSPITAL_COMMUNITY): Payer: Self-pay | Admitting: Gastroenterology

## 2023-05-11 DIAGNOSIS — R109 Unspecified abdominal pain: Secondary | ICD-10-CM | POA: Diagnosis not present

## 2023-05-11 LAB — CBC
HCT: 23.2 % — ABNORMAL LOW (ref 39.0–52.0)
Hemoglobin: 7.8 g/dL — ABNORMAL LOW (ref 13.0–17.0)
MCH: 32.6 pg (ref 26.0–34.0)
MCHC: 33.6 g/dL (ref 30.0–36.0)
MCV: 97.1 fL (ref 80.0–100.0)
Platelets: 45 10*3/uL — ABNORMAL LOW (ref 150–400)
RBC: 2.39 MIL/uL — ABNORMAL LOW (ref 4.22–5.81)
RDW: 15.4 % (ref 11.5–15.5)
WBC: 1.4 10*3/uL — CL (ref 4.0–10.5)
nRBC: 0 % (ref 0.0–0.2)

## 2023-05-11 LAB — TYPE AND SCREEN
ABO/RH(D): O POS
Antibody Screen: NEGATIVE
Unit division: 0
Unit division: 0
Unit division: 0
Unit division: 0
Unit division: 0

## 2023-05-11 LAB — COMPREHENSIVE METABOLIC PANEL
ALT: 16 U/L (ref 0–44)
AST: 36 U/L (ref 15–41)
Albumin: 1.9 g/dL — ABNORMAL LOW (ref 3.5–5.0)
Alkaline Phosphatase: 76 U/L (ref 38–126)
Anion gap: 4 — ABNORMAL LOW (ref 5–15)
BUN: 29 mg/dL — ABNORMAL HIGH (ref 6–20)
CO2: 18 mmol/L — ABNORMAL LOW (ref 22–32)
Calcium: 8.1 mg/dL — ABNORMAL LOW (ref 8.9–10.3)
Chloride: 107 mmol/L (ref 98–111)
Creatinine, Ser: 2.06 mg/dL — ABNORMAL HIGH (ref 0.61–1.24)
GFR, Estimated: 37 mL/min — ABNORMAL LOW (ref >60)
Glucose, Bld: 160 mg/dL — ABNORMAL HIGH (ref 70–99)
Potassium: 3.7 mmol/L (ref 3.5–5.1)
Sodium: 129 mmol/L — ABNORMAL LOW (ref 135–145)
Total Bilirubin: 2.7 mg/dL — ABNORMAL HIGH (ref <1.2)
Total Protein: 5.7 g/dL — ABNORMAL LOW (ref 6.5–8.1)

## 2023-05-11 LAB — MAGNESIUM: Magnesium: 2 mg/dL (ref 1.7–2.4)

## 2023-05-11 LAB — DIFFERENTIAL
Abs Immature Granulocytes: 0.01 10*3/uL (ref 0.00–0.07)
Basophils Absolute: 0 10*3/uL (ref 0.0–0.1)
Basophils Relative: 0 %
Eosinophils Absolute: 0 10*3/uL (ref 0.0–0.5)
Eosinophils Relative: 3 %
Immature Granulocytes: 1 %
Lymphocytes Relative: 19 %
Lymphs Abs: 0.3 10*3/uL — ABNORMAL LOW (ref 0.7–4.0)
Monocytes Absolute: 0.2 10*3/uL (ref 0.1–1.0)
Monocytes Relative: 14 %
Neutro Abs: 0.8 10*3/uL — ABNORMAL LOW (ref 1.7–7.7)
Neutrophils Relative %: 63 %

## 2023-05-11 LAB — FERRITIN: Ferritin: 57 ng/mL (ref 24–336)

## 2023-05-11 LAB — BPAM RBC
Blood Product Expiration Date: 202412062359
Blood Product Expiration Date: 202412242359
Blood Product Expiration Date: 202412242359
Blood Product Expiration Date: 202412242359
Blood Product Expiration Date: 202412242359
ISSUE DATE / TIME: 202411222056
ISSUE DATE / TIME: 202411230037
ISSUE DATE / TIME: 202411240813
ISSUE DATE / TIME: 202411241309
Unit Type and Rh: 5100
Unit Type and Rh: 5100
Unit Type and Rh: 5100
Unit Type and Rh: 5100
Unit Type and Rh: 5100

## 2023-05-11 LAB — IRON AND TIBC
Iron: 100 ug/dL (ref 45–182)
Saturation Ratios: 42 % — ABNORMAL HIGH (ref 17.9–39.5)
TIBC: 241 ug/dL — ABNORMAL LOW (ref 250–450)
UIBC: 141 ug/dL

## 2023-05-11 LAB — GLUCOSE, CAPILLARY
Glucose-Capillary: 147 mg/dL — ABNORMAL HIGH (ref 70–99)
Glucose-Capillary: 166 mg/dL — ABNORMAL HIGH (ref 70–99)
Glucose-Capillary: 157 mg/dL — ABNORMAL HIGH (ref 70–99)
Glucose-Capillary: 177 mg/dL — ABNORMAL HIGH (ref 70–99)

## 2023-05-11 LAB — VITAMIN B12: Vitamin B-12: 1028 pg/mL — ABNORMAL HIGH (ref 180–914)

## 2023-05-11 LAB — PROTIME-INR
INR: 1.8 — ABNORMAL HIGH (ref 0.8–1.2)
Prothrombin Time: 20.8 s — ABNORMAL HIGH (ref 11.4–15.2)

## 2023-05-11 LAB — RETICULOCYTES
Immature Retic Fract: 13.6 % (ref 2.3–15.9)
RBC.: 2.6 MIL/uL — ABNORMAL LOW (ref 4.22–5.81)
Retic Count, Absolute: 45.8 10*3/uL (ref 19.0–186.0)
Retic Ct Pct: 1.8 % (ref 0.4–3.1)

## 2023-05-11 LAB — FOLATE: Folate: >40 ng/mL (ref >5.9)

## 2023-05-11 LAB — SURGICAL PATHOLOGY

## 2023-05-11 LAB — PHOSPHORUS: Phosphorus: 3.3 mg/dL (ref 2.5–4.6)

## 2023-05-11 MED ORDER — SPIRONOLACTONE 25 MG PO TABS
100.0000 mg | ORAL_TABLET | Freq: Every day | ORAL | Status: DC
  Administered 2023-05-12: 100 mg via ORAL
  Filled 2023-05-11: qty 4

## 2023-05-11 MED ORDER — MIDODRINE HCL 5 MG PO TABS
5.0000 mg | ORAL_TABLET | Freq: Three times a day (TID) | ORAL | Status: DC
  Administered 2023-05-11 – 2023-05-12 (×3): 5 mg via ORAL
  Filled 2023-05-11 (×3): qty 1

## 2023-05-11 MED ORDER — NADOLOL 20 MG PO TABS
20.0000 mg | ORAL_TABLET | Freq: Every day | ORAL | Status: DC
  Administered 2023-05-12: 20 mg via ORAL
  Filled 2023-05-11: qty 1

## 2023-05-11 NOTE — Progress Notes (Signed)
Mobility Specialist - Progress Note  Pre-mobility: 59 bpm HR, 99% SpO2 During mobility: 72 bpm HR, 97% SpO2 Post-mobility: 68 bpm HR, 98% SPO2   05/11/23 1308  Mobility  Activity Ambulated independently in hallway  Level of Assistance Independent  Assistive Device None  Distance Ambulated (ft) 500 ft  Range of Motion/Exercises Active  Activity Response Tolerated well  Mobility Referral Yes  $Mobility charge 1 Mobility  Mobility Specialist Start Time (ACUTE ONLY) 1258  Mobility Specialist Stop Time (ACUTE ONLY) 1308  Mobility Specialist Time Calculation (min) (ACUTE ONLY) 10 min   Pt was found in bed and agreeable to ambulate. No complaints with session. At EOS returned to bed with all needs met. Call bell in reach.  Billey Chang Mobility Specialist

## 2023-05-11 NOTE — Progress Notes (Signed)
HOSPITALIST ROUNDING NOTE Martin Anthony ZOX:[id number]  DOB: 1966/04/08  DOA: 05/07/2023  PCP: Nelwyn Salisbury, MD  05/11/2023,3:18 PM   LOS: 4 days      Code Status: full  From:  home    Current Dispo: home     57 year old male known A-fib CHADVASC >4 not on anticoagulation Prior EtOH DM TY 2 peripheral neuropathy CKD 3 Previous high-grade tubular adenoma Liver cirrhosis presumed EtOH with large esophageal varices since at least 2020-first found 01/19/2019 by Dr. Elnoria Howard Subsequent hospitalization 2022 with AKI secondary to rhabdo and being found down and was on the verge of dialysis but he improved to the point that he could go home hemicolectomy for pT3 N0 stage II-III ascending colon cancer with ileostomy [place] 10/25/2021 Hospitalized 10/8--10/24 blood in ileostomy bag hemoglobin 6.9 CT showed inflammatory stranding duodenitis no active bleeding on angiogram--rx octreotide Protonix Xifaxan-EGD showed grade 3 varices that were banded --COmplication of agitated delirium 03/25/2023 and Precedex was started 10/11 and patient went to the ICU--palliative care consulted MELD 24-not a candidate for transplant-he had ascites SBP r/o paracentesis X2--it was felt that he might have a liver mass additionally and that we will need a triple phase follow-up CT discharged home with 2 weeks of antibiotics for TEE negative questionable endocarditis and went home with hemoglobin of 8  Went to PCP office for reducible abdominal wall hernia with worsening abdominal pain much worse after eating 10/10 pain ostomy functioning-was referred by PCP--> Dr. Sherlynn Stalls to evaluate the hernia   hemoglobin from 8-6.4 and he was referred by PCP to ED Sodium 130 potassium 3.2 CO2 17 BUN/creatinine 28/1.9 (baseline about 2) Alk phos 136 lipase 374 AST/ALT 42/20 WBC 2.5 hemoglobin 6.6 MCV 102 platelet 79--INR 1.7 [name] ABD advanced cirrhosis no focal intrahepatic mass moderate splenomegaly ascites with portal venous hypertension  marked gallbladder wall thickening no sludge stones no sonographic evidence of acute cholecystitis 11/23 CT scan shows marked circumferential second portion duodenal inflammation cirrhosis with portal hypertension and small volume ascites nonobstructing calculi right colectomy changes moderate right-sided inguinal hernia with ascites  Attempt at paracentesis unsuccessful 11/24  endoscopy= less than 5 mm esophageal varices no bleeding GAVE erosive nodular duodenitis mucosal bleeding from this 11/24 transfused 2 units PRBC  Plan  GI Erosive duodenitis causing slow GI bleed-graduate diet very slowly-for now continue Protonix 40 daily--pathology 11/24 still pending--- no further blood coming out of ostomy Elevated lipase 300-secondary to duodenitis-CT scan does not confirm pancreatitis-pain meds Oxy IR only-discontinue IV pain meds Liver cirrhosis MELD score 24/ +pancytopenia---Increase Aldactone to 100 keep Lasix 20 given hyponatremia - will increase midodrine to 5 x 3 / daily- stop the Coreg XR--place on corgard 20 from am--continue rifaximin ?  SBP-he is feeling more full in his abdomen, Place order again for US paracentesis--- await standing weights-restrict free water, fluid restriction 1500 cc on front of door-ciprofloxacin to end treatment 05/12/2027 for SBP GI may be able to get him in for transplant as he has been free from EtOH since September 2024 High-grade tubular adenoma in the past-needs outpatient further characterization   Onc/hemo- pT3 N0 cancer with ileostomy 10/25/2021\ Likely nonsurgical options for inguinal hernias Anemia of acute blood loss secondary to duodenitis--3 units transfused hemoglobin threshold transfusion = 7--received transfusion earlier hemoglobin has been remained stable--if drops below needs a rpt tx Get iron studies tomorrow-probably not a good candidate for IV iron if has infection however  Renal Hypervolemic hyponatremia secondary to ascites-fluid restrict  as above--- Aldactone  as above, increase Lasix from 20 tomorrow probably tomorrow if sodium is still low-I have told him to restrict his salt as well as his water CKD 3 AA at baseline--watch kidney function -- difficult with impaired splanchnic circulation--- see above regarding beta-blocker/ midodrine dosing Mild metabolic acidosis-if any lower than today's labs would start bicarb  DM TY 2 complication of neuropathy Continue gabapentin 300 at bedtime and 100 twice daily meal  A-fib CHADVASC >4 has bled score >3 Not a candidate for anticoagulation-on monitors is sinus rhythm Coreg switch to nadolol 20 as above  Goals of care Is still full code has been seen by palliative care previously-May need to revisit this discussion in the outpatient setting   DVT prophylaxis: SCD  Status is: Inpatient Remains inpatient appropriate because:   Improved overall awaiting stability of hemoglobin  Subjective:  Tolerating soft diet to some degree no fever no chills feels a little bit more swollen abdomen No blood coming out of ostomy  Objective + exam Vitals:   05/10/23 1955 05/11/23 0505 05/11/23 0750 05/11/23 1149  BP: (!) 102/55 125/62 (!) 117/59 117/67  Pulse: (!) 58 62 62 (!) 54  Resp: 12  17 16   Temp: 99.3 F (37.4 C) 99.2 F (37.3 C) 98.8 F (37.1 C) 97.8 F (36.6 C)  TempSrc: Oral Oral Axillary Oral  SpO2: 100% 100% 100%   Weight:      Height:       Filed Weights   05/07/23 1829 05/07/23 2339 05/09/23 0941  Weight: 78 kg 77.8 kg 77.1 kg    Examination:  Awake coherent no icterus no pallor S1-S2 abdomen distended some shifting dullness noted no asterixis no chills Neurologically intact moving all 4 limbs equally ROM   Data Reviewed: reviewed   CBC    Component Value Date/Time   WBC 1.4 (LL) 05/11/2023 0443   RBC 2.39 (L) 05/11/2023 0443   HGB 7.8 (L) 05/11/2023 0443   HCT 23.2 (L) 05/11/2023 0443   PLT 45 (L) 05/11/2023 0443   MCV 97.1 05/11/2023 0443   MCH 32.6  05/11/2023 0443   MCHC 33.6 05/11/2023 0443   RDW 15.4 05/11/2023 0443   LYMPHSABS 0.3 (L) 05/11/2023 0443   MONOABS 0.2 05/11/2023 0443   EOSABS 0.0 05/11/2023 0443   BASOSABS 0.0 05/11/2023 0443      Latest Ref Rng & Units 05/11/2023    4:43 AM 05/10/2023    4:19 AM 05/09/2023    5:13 AM  CMP  Glucose 70 - 99 mg/dL 161  096  045   BUN 6 - 20 mg/dL 29  27  25    Creatinine 0.61 - 1.24 mg/dL 4.09  8.11  9.14   Sodium 135 - 145 mmol/L 129  133  129   Potassium 3.5 - 5.1 mmol/L 3.7  3.7  3.4   Chloride 98 - 111 mmol/L 107  110  106   CO2 22 - 32 mmol/L 18  20  19    Calcium 8.9 - 10.3 mg/dL 8.1  8.1  7.7   Total Protein 6.5 - 8.1 g/dL 5.7  5.9  6.0   Total Bilirubin <1.2 mg/dL 2.7  2.4  2.7   Alkaline Phos 38 - 126 U/L 76  73  69   AST 15 - 41 U/L 36  40  38   ALT 0 - 44 U/L 16  16  18       Scheduled Meds:  carvedilol  3.125 mg Oral BID WC   ciprofloxacin  500 mg Oral BID   feeding supplement  237 mL Oral TID BM   ferrous sulfate  325 mg Oral Q breakfast   folic acid  1 mg Oral Daily   furosemide  20 mg Oral Daily   gabapentin  100 mg Oral BID   gabapentin  300 mg Oral QHS   insulin aspart  0-15 Units Subcutaneous TID WC   midodrine  2.5 mg Oral TID WC   multivitamin with minerals  1 tablet Oral Daily   nicotine  7 mg Transdermal Daily   nystatin  1 Application Topical TID   pantoprazole (PROTONIX) IV  40 mg Intravenous Q12H   rifaximin  550 mg Oral BID   sertraline  50 mg Oral Daily   [START ON 05/12/2023] spironolactone  100 mg Oral Daily   sucralfate  1 g Oral TID WC & HS   thiamine  100 mg Oral Daily   Continuous Infusions:   Time  20  Rhetta Mura, MD  [facility]

## 2023-05-11 NOTE — Plan of Care (Signed)
  Problem: Nutritional: Goal: Maintenance of adequate nutrition will improve Outcome: Progressing   Problem: Activity: Goal: Risk for activity intolerance will decrease Outcome: Progressing   Problem: Nutrition: Goal: Adequate nutrition will be maintained Outcome: Progressing   Problem: Pain Management: Goal: General experience of comfort will improve Outcome: Progressing

## 2023-05-12 ENCOUNTER — Other Ambulatory Visit (HOSPITAL_COMMUNITY): Payer: Self-pay

## 2023-05-12 ENCOUNTER — Inpatient Hospital Stay (HOSPITAL_COMMUNITY): Payer: Medicaid Other

## 2023-05-12 DIAGNOSIS — R109 Unspecified abdominal pain: Secondary | ICD-10-CM | POA: Diagnosis not present

## 2023-05-12 DIAGNOSIS — E440 Moderate protein-calorie malnutrition: Secondary | ICD-10-CM | POA: Insufficient documentation

## 2023-05-12 LAB — GLUCOSE, CAPILLARY
Glucose-Capillary: 129 mg/dL — ABNORMAL HIGH (ref 70–99)
Glucose-Capillary: 163 mg/dL — ABNORMAL HIGH (ref 70–99)

## 2023-05-12 LAB — ALBUMIN, PLEURAL OR PERITONEAL FLUID: Albumin, Fluid: <1.5 g/dL

## 2023-05-12 LAB — BODY FLUID CELL COUNT WITH DIFFERENTIAL
Eos, Fluid: 0 %
Lymphs, Fluid: 41 %
Monocyte-Macrophage-Serous Fluid: 55 % (ref 50–90)
Neutrophil Count, Fluid: 4 % (ref 0–25)
Total Nucleated Cell Count, Fluid: 313 uL (ref 0–1000)

## 2023-05-12 LAB — LACTATE DEHYDROGENASE, PLEURAL OR PERITONEAL FLUID: LD, Fluid: 30 U/L — ABNORMAL HIGH (ref 3–23)

## 2023-05-12 LAB — CBC
HCT: 24.7 % — ABNORMAL LOW (ref 39.0–52.0)
Hemoglobin: 8.4 g/dL — ABNORMAL LOW (ref 13.0–17.0)
MCH: 33.2 pg (ref 26.0–34.0)
MCHC: 34 g/dL (ref 30.0–36.0)
MCV: 97.6 fL (ref 80.0–100.0)
Platelets: 58 10*3/uL — ABNORMAL LOW (ref 150–400)
RBC: 2.53 MIL/uL — ABNORMAL LOW (ref 4.22–5.81)
RDW: 15.3 % (ref 11.5–15.5)
WBC: 1.6 10*3/uL — ABNORMAL LOW (ref 4.0–10.5)
nRBC: 0 % (ref 0.0–0.2)

## 2023-05-12 LAB — BASIC METABOLIC PANEL
Anion gap: 6 (ref 5–15)
BUN: 30 mg/dL — ABNORMAL HIGH (ref 6–20)
CO2: 18 mmol/L — ABNORMAL LOW (ref 22–32)
Calcium: 8.3 mg/dL — ABNORMAL LOW (ref 8.9–10.3)
Chloride: 107 mmol/L (ref 98–111)
Creatinine, Ser: 1.99 mg/dL — ABNORMAL HIGH (ref 0.61–1.24)
GFR, Estimated: 39 mL/min — ABNORMAL LOW (ref >60)
Glucose, Bld: 206 mg/dL — ABNORMAL HIGH (ref 70–99)
Potassium: 4 mmol/L (ref 3.5–5.1)
Sodium: 131 mmol/L — ABNORMAL LOW (ref 135–145)

## 2023-05-12 MED ORDER — PANTOPRAZOLE SODIUM 40 MG PO TBEC
40.0000 mg | DELAYED_RELEASE_TABLET | Freq: Two times a day (BID) | ORAL | 0 refills | Status: DC
  Filled 2023-05-12: qty 30, 15d supply, fill #0

## 2023-05-12 MED ORDER — LIDOCAINE HCL 1 % IJ SOLN
INTRAMUSCULAR | Status: AC
  Filled 2023-05-12: qty 20

## 2023-05-12 MED ORDER — PANTOPRAZOLE SODIUM 40 MG PO TBEC
40.0000 mg | DELAYED_RELEASE_TABLET | Freq: Every day | ORAL | 0 refills | Status: DC
  Filled 2023-05-12: qty 360, 360d supply, fill #0

## 2023-05-12 MED ORDER — SPIRONOLACTONE 100 MG PO TABS
100.0000 mg | ORAL_TABLET | Freq: Every day | ORAL | 0 refills | Status: DC
  Filled 2023-05-12: qty 30, 30d supply, fill #0

## 2023-05-12 MED ORDER — PANTOPRAZOLE SODIUM 40 MG PO TBEC
40.0000 mg | DELAYED_RELEASE_TABLET | Freq: Every day | ORAL | 0 refills | Status: DC
  Filled 2023-05-12 (×2): qty 30, 30d supply, fill #0

## 2023-05-12 MED ORDER — MIDODRINE HCL 5 MG PO TABS
5.0000 mg | ORAL_TABLET | Freq: Three times a day (TID) | ORAL | 0 refills | Status: DC
  Filled 2023-05-12: qty 90, 30d supply, fill #0

## 2023-05-12 MED ORDER — PANTOPRAZOLE SODIUM 40 MG PO TBEC
40.0000 mg | DELAYED_RELEASE_TABLET | Freq: Every day | ORAL | 5 refills | Status: AC
  Filled 2023-05-12: qty 30, 30d supply, fill #0

## 2023-05-12 MED ORDER — SUCRALFATE 1 G PO TABS
1.0000 g | ORAL_TABLET | Freq: Three times a day (TID) | ORAL | 0 refills | Status: DC
  Filled 2023-05-12: qty 120, 30d supply, fill #0

## 2023-05-12 MED ORDER — PANTOPRAZOLE SODIUM 40 MG PO TBEC
40.0000 mg | DELAYED_RELEASE_TABLET | Freq: Every day | ORAL | 5 refills | Status: DC
  Filled 2023-05-12: qty 30, 30d supply, fill #0

## 2023-05-12 NOTE — Procedures (Signed)
   PROCEDURE SUMMARY:  Successful image-guided paracentesis from the LLQ abdomen.  Yielded 2.0 liters of dark yellow peritoneal fluid.  Small to moderate hematoma at puncture site.  EBL: zero Patient tolerated well.   Specimen was sent for labs.  Please see imaging section of Epic for full dictation.  Bing Neighbors Kassadie Pancake PA-C 05/12/2023 11:07 AM

## 2023-05-12 NOTE — Progress Notes (Signed)
  IR NOTE:  I rounded on patient and examined this morning's paracentesis puncture site. Minimal induration and tenderness noted at puncture site on exam. Notable resolution of associated mild to moderate swelling. No erythema, fluctuance, crepitus, weeping, discharge, bleeding, nor warmth to area. Patient will continue to monitor and inform nursing staff of any worsening of his presentation.   Buzzy Han, PA-C

## 2023-05-12 NOTE — Hospital Course (Addendum)
57 yof w/ A-fib CHADVASC >4 not on AC, prior EtOH DM TY 2 peripheral neuropathy CKD 3, previous high-grade tubular adenoma, Liver cirrhosis presumed EtOH with large esophageal varices since at least 2020-first found 01/19/2019 by Dr. Elnoria Howard Subsequent hospitalization 2022 with AKI secondary to rhabdo and being found down and was on the verge of dialysis but he improved to the point that he could go home, hemicolectomy for pT3 N0 stage II-III ascending colon cancer with ileostomy [place] 10/25/2021, recently hospitalized 10/8--10/24 blood in ileostomy bag hemoglobin 6.9 CT showed inflammatory stranding duodenitis no active bleeding on angiogram--rx octreotide Protonix Xifaxan-EGD showed grade 3 varices that were banded -ahd agitated delirium 03/25/2023 and Precedex was started 10/11 and patient went to the ICU--palliative care consulted MELD 24-not a candidate for transplant-he had ascites SBP r/o paracentesis X2--it was felt that he might have a liver mass additionally and that we will need a triple phase follow-up CT, was discharged home with 2 weeks of antibiotics for TEE negative questionable endocarditis and went home with hemoglobin of 8 seen by primary care to the ED for evaluation of hernia after having worsening abdominal pain and reducible abdominal hernia In the ED: Vitals stable ,sodium 130 potassium 3.2 CO2 17 BUN/creatinine 28/1.9 (baseline about 2) Alk phos 136 lipase 374 AST/ALT 42/20 WBC 2.5 hemoglobin 6.6 MCV 102 platelet 79--INR 1.7 [name] ABD>>< advanced cirrhosis no focal intrahepatic mass moderate splenomegaly ascites with portal venous hypertension marked gallbladder wall thickening no sludge stones no sonographic evidence of acute cholecystitis CT scan>>marked circumferential second portion duodenal inflammation cirrhosis with portal hypertension and small volume ascites nonobstructing calculi right colectomy changes moderate right-sided inguinal hernia with ascites artempt at paracentesis  unsuccessful.  Patient is admitted 11/24  endoscopy= less than 5 mm esophageal varices no bleeding GAVE erosive nodular duodenitis mucosal bleeding from this, 11/24 transfused 2 units PRBC. 11/27- 2l ascitic fluid removed. 313 total cells, PMNL-4% monocyte 44% At this time patient is tolerating diet well no new complaints.  He is eager to return home and follow-up.

## 2023-05-12 NOTE — Progress Notes (Signed)
Mobility Specialist - Progress Note   05/12/23 1317  Mobility  Activity Ambulated with assistance in hallway  Level of Assistance Independent after set-up  Assistive Device None  Distance Ambulated (ft) 500 ft  Range of Motion/Exercises Active  Activity Response Tolerated well  $Mobility charge 1 Mobility  Mobility Specialist Start Time (ACUTE ONLY) 1301  Mobility Specialist Stop Time (ACUTE ONLY) 1312  Mobility Specialist Time Calculation (min) (ACUTE ONLY) 11 min   Received in bed and agreed to mobility. Had no issues throughout session, returned to bed with all needs met.   Marilynne Halsted Mobility Specialist

## 2023-05-12 NOTE — TOC Transition Note (Signed)
Transition of Care [facility]) - CM/SW Discharge Note   Patient Details  Name: Martin Anthony MRN: [id number] Date of Birth: [birth year]  Transition of Care [facility]) CM/SW Contact:  Lanier Clam, RN Phone Number: 05/12/2023, 3:43 PM   Clinical Narrative: d/c home no needs or orders.      Final next level of care: Home/Self Care Barriers to Discharge: No Barriers Identified   Patient Goals and CMS Choice CMS Medicare.gov Compare Post Acute Care list provided to:: Patient Choice offered to / list presented to : Patient  Discharge Placement                         Discharge Plan and Services Additional resources added to the After Visit Summary for     Discharge Planning Services: CM Consult                                 Social Determinants of Health (SDOH) Interventions SDOH Screenings   Food Insecurity: No Food Insecurity (05/08/2023)  Recent Concern: Food Insecurity - Food Insecurity Present (03/23/2023)  Housing: High Risk (05/08/2023)  Transportation Needs: No Transportation Needs (05/08/2023)  Recent Concern: Transportation Needs - Unmet Transportation Needs (03/23/2023)  Utilities: Not At Risk (05/08/2023)  Alcohol Screen: High Risk (12/05/2021)  Depression (PHQ2-9): High Risk (02/02/2023)  Financial Resource Strain: Low Risk  (10/06/2022)   Received from [facility], [facility], [facility], [facility] [place] [place]  Physical Activity: Unknown (12/05/2021)  Social Connections: Unknown (08/11/2022)   Received from [facility], [facility]  Stress: Stress Concern Present (12/05/2021)  Tobacco Use: Medium Risk (05/07/2023)  Health Literacy: Adequate Health Literacy (11/26/2021)   Received from [facility] [place], [facility]  Recent Concern: Health Literacy - Inadequate Health Literacy (11/01/2021)   Received from [facility] [place], [facility] [place]     Readmission Risk  Interventions    05/10/2023    4:28 PM  Readmission Risk Prevention Plan  Medication Review (RN Care Manager) Complete  PCP or Specialist appointment within 3-5 days of discharge Complete  HRI or Home Care Consult Complete  SW Recovery Care/Counseling Consult Complete  Palliative Care Screening Not Applicable  Skilled Nursing Facility Not Applicable

## 2023-05-12 NOTE — Discharge Summary (Addendum)
Physician Discharge Summary  Martin Anthony WCB:[id number] DOB: 1965/08/28 DOA: 05/07/2023  PCP: Nelwyn Salisbury, MD  Admit date: 05/07/2023 Discharge date: 05/12/2023 Recommendations for Outpatient Follow-up:  Follow up with PCP in 1 weeks-call for appointment Follow-up with general surgery as present, follow-up with gastroenterology as outpatient Please obtain BMP/CBC in one week  Discharge Dispo: HOME Discharge Condition: Stable Code Status:   Code Status: Full Code Diet recommendation:  Diet Order             Diet 2 gram sodium Room service appropriate? Yes; Fluid consistency: Thin; Fluid restriction: 1500 mL Fluid  Diet effective now                    Brief/Interim Summary: 57 yof w/ A-fib CHADVASC >4 not on AC, prior EtOH DM TY 2 peripheral neuropathy CKD 3, previous high-grade tubular adenoma, Liver cirrhosis presumed EtOH with large esophageal varices since at least 2020-first found 01/19/2019 by Dr. Elnoria Howard Subsequent hospitalization 2022 with AKI secondary to rhabdo and being found down and was on the verge of dialysis but he improved to the point that he could go home, hemicolectomy for pT3 N0 stage II-III ascending colon cancer with ileostomy [place] 10/25/2021, recently hospitalized 10/8--10/24 blood in ileostomy bag hemoglobin 6.9 CT showed inflammatory stranding duodenitis no active bleeding on angiogram--rx octreotide Protonix Xifaxan-EGD showed grade 3 varices that were banded -ahd agitated delirium 03/25/2023 and Precedex was started 10/11 and patient went to the ICU--palliative care consulted MELD 24-not a candidate for transplant-he had ascites SBP r/o paracentesis X2--it was felt that he might have a liver mass additionally and that we will need a triple phase follow-up CT, was discharged home with 2 weeks of antibiotics for TEE negative questionable endocarditis and went home with hemoglobin of 8 seen by primary care to the ED for evaluation of hernia after having  worsening abdominal pain and reducible abdominal hernia In the ED: Vitals stable ,sodium 130 potassium 3.2 CO2 17 BUN/creatinine 28/1.9 (baseline about 2) Alk phos 136 lipase 374 AST/ALT 42/20 WBC 2.5 hemoglobin 6.6 MCV 102 platelet 79--INR 1.7 [name] ABD>>< advanced cirrhosis no focal intrahepatic mass moderate splenomegaly ascites with portal venous hypertension marked gallbladder wall thickening no sludge stones no sonographic evidence of acute cholecystitis CT scan>>marked circumferential second portion duodenal inflammation cirrhosis with portal hypertension and small volume ascites nonobstructing calculi right colectomy changes moderate right-sided inguinal hernia with ascites artempt at paracentesis unsuccessful.  Patient is admitted 11/24  endoscopy= less than 5 mm esophageal varices no bleeding GAVE erosive nodular duodenitis mucosal bleeding from this, 11/24 transfused 2 units PRBC. 11/27- 2l ascitic fluid removed. 313 total cells, PMNL-4% monocyte 44% At this time patient is tolerating diet well no new complaints.  He is eager to return home and follow-up.    Discharge Diagnoses:  Principal Problem:   Abdominal pain in male Active Problems:   Protein calorie malnutrition (HCC)   PAF (paroxysmal atrial fibrillation) (HCC)   Alcoholic cirrhosis of liver with ascites (HCC)   Depression with anxiety   Essential hypertension   GERD   Anemia   Type 2 diabetes mellitus with neurological complications (HCC)   CKD (chronic kidney disease) stage 3, GFR 30-59 ml/min (HCC)   Pancytopenia (HCC)   Alcoholic peripheral neuropathy (HCC)   Abdominal pain   Malnutrition of moderate degree  Erosive duodenitis Slow GI bleed: pathology report shows no malignancy. continue PPI bid. NO bleeding from ostomy.   Elevated lipase likely in the setting  of #1 no pancreatitis on imaging  Liver cirrhosis with esophageal varices. Abdominal pain Ascites:  Likely from above- ruled our SBP, repeat  ultrasound paracentesis  done 11/27- 2l ascitic fluid removed. 313 total cells, PMNL-4% monocyte 44% GI following now on nadolol.  GI has signed off  ABLA Chronic anemia from chronic disease: Transfuse to keep hemoglobin above 7 g.  Follow-up iron studies-normal serum iron saturation 42 folate B12 ferritin stable/high. Hb stable 8.4gm   Thrombocytopenia chronic in the setting of liver disease: Platelets trending up at 58k.  Recheck in 1 week  Inguinal hernias: Likely nonsurgical with his complex medical comorbidities.  Reducible moderate-sized right inguinal hernia- needs follow-up outpatient  History of colon cancer status post right hemicolectomy and end ileostomy: No issues.  Type 2 diabetes mellitus with neuropathy: Blood sugar borderline controlled in the 200   Afib: not on anticoagulation. Coreg has changed to nadolol  CK IIIb with metabolic acidosis Hyponatremia: Repeat labs-shows sodium improving 131 creatinine stable improving 1.99    Consults: GI Subjective: Alert awake resting comfortably, eager to go home today  Discharge Exam: Vitals:   05/12/23 1023 05/12/23 1134  BP: 112/61 118/61  Pulse:  (!) 53  Resp:  16  Temp:  98 F (36.7 C)  SpO2:  100%   General: Pt is alert, awake, not in acute distress Cardiovascular: RRR, S1/S2 +, no rubs, no gallops Respiratory: CTA bilaterally, no wheezing, no rhonchi Abdominal: Soft, NT, ND, bowel sounds + Extremities: no edema, no cyanosis  Discharge Instructions  Discharge Instructions     (HEART FAILURE PATIENTS) Call MD:  Anytime you have any of the following symptoms: 1) 3 pound weight gain in 24 hours or 5 pounds in 1 week 2) shortness of breath, with or without a dry hacking cough 3) swelling in the hands, feet or stomach 4) if you have to sleep on extra pillows at night in order to breathe.   Complete by: As directed    Discharge instructions   Complete by: As directed    Please call call MD or return to ER  for similar or worsening recurring problem that brought you to hospital or if any fever,nausea/vomiting,abdominal pain, uncontrolled pain, chest pain,  shortness of breath or any other alarming symptoms.  Please follow-up your doctor as instructed in a week time and call the office for appointment.  Please avoid alcohol, smoking, or any other illicit substance and maintain healthy habits including taking your regular medications as prescribed.  You were cared for by a hospitalist during your hospital stay. If you have any questions about your discharge medications or the care you received while you were in the hospital after you are discharged, you can call the unit and ask to speak with the hospitalist on call if the hospitalist that took care of you is not available.  Once you are discharged, your primary care physician will handle any further medical issues. Please note that NO REFILLS for any discharge medications will be authorized once you are discharged, as it is imperative that you return to your primary care physician (or establish a relationship with a primary care physician if you do not have one) for your aftercare needs so that they can reassess your need for medications and monitor your lab values   Increase activity slowly   Complete by: As directed       Allergies as of 05/12/2023       Reactions   Acetaminophen Other (See Comments)  States he was told not to take it- Contraindicated for condition regarding the liver   Ibuprofen Other (See Comments)   Liver sensitivity- Contraindicated for condition regarding the liver MD told the patient he may take this in very low doses in 04/2023        Medication List     STOP taking these medications    carvedilol 3.125 MG tablet Commonly known as: COREG       TAKE these medications    folic acid 1 MG tablet Commonly known as: FOLVITE Take 1 tablet (1 mg total) by mouth daily.   furosemide 20 MG tablet Commonly known  as: LASIX Take 1 tablet (20 mg total) by mouth daily.   gabapentin 300 MG capsule Commonly known as: NEURONTIN Take 300 mg by mouth at bedtime. What changed: Another medication with the same name was changed. Make sure you understand how and when to take each.   gabapentin 100 MG capsule Commonly known as: NEURONTIN Take 1 capsule (100 mg total) by mouth 3 (three) times daily. What changed: when to take this   Iron (Ferrous Sulfate) 325 (65 Fe) MG Tabs Take 325 mg by mouth daily. What changed: when to take this   midodrine 5 MG tablet Commonly known as: PROAMATINE Take 1 tablet (5 mg total) by mouth 3 (three) times daily with meals. What changed:  medication strength how much to take   nadolol 20 MG tablet Commonly known as: Corgard Take 1 tablet (20 mg total) by mouth daily.   nicotine 14 mg/24hr patch Commonly known as: NICODERM CQ - dosed in mg/24 hours Place 1 patch (14 mg total) onto the skin daily. What changed: how much to take   nystatin powder Commonly known as: MYCOSTATIN/NYSTOP Apply 1 Application topically 3 (three) times daily. What changed:  when to take this additional instructions   pantoprazole 40 MG tablet Commonly known as: PROTONIX Take 1 tablet (40 mg total) by mouth 2 (two) times daily for 28 days. What changed: when to take this   pantoprazole 40 MG tablet Commonly known as: Protonix Take 1 tablet (40 mg total) by mouth daily. Start taking on: June 11, 2023 What changed: You were already taking a medication with the same name, and this prescription was added. Make sure you understand how and when to take each.   rifaximin 550 MG Tabs tablet Commonly known as: XIFAXAN Take 1 tablet (550 mg total) by mouth 2 (two) times daily.   sertraline 50 MG tablet Commonly known as: ZOLOFT Take 1 tablet (50 mg total) by mouth daily.   spironolactone 100 MG tablet Commonly known as: ALDACTONE Take 1 tablet (100 mg total) by mouth daily. Start  taking on: May 13, 2023 What changed:  medication strength how much to take   sucralfate 1 g tablet Commonly known as: CARAFATE Take 1 tablet (1 g total) by mouth 4 (four) times daily -  with meals and at bedtime.   thiamine 100 MG tablet Commonly known as: VITAMIN B1 Take 1 tablet (100 mg total) by mouth daily.        Follow-up Information     Nelwyn Salisbury, MD Follow up in 1 week(s).   Specialty: Family Medicine Contact information: [address] [place] [address] [phone]                Allergies  Allergen Reactions   Acetaminophen Other (See Comments)    States he was told not to take it-  Contraindicated for condition regarding the liver   Ibuprofen Other (See Comments)    Liver sensitivity- Contraindicated for condition regarding the liver  MD told the patient he may take this in very low doses in 04/2023    The results of significant diagnostics from this hospitalization (including imaging, microbiology, ancillary and laboratory) are listed below for reference.    Microbiology: No results found for this or any previous visit (from the past 240 hour(s)).  Procedures/Studies: US Paracentesis  Result Date: 05/12/2023 INDICATION: ASCITES WITH ABDOMINAL DISTENSION Reported history of colon cancer, alcoholic cirrhosis, and portal hypertension with recurrent ascites EXAM: ULTRASOUND GUIDED DIAGNOSTIC AND THERAPEUTIC PARACENTESIS MEDICATIONS: 9 cc of 1% lidocaine COMPLICATIONS: SIR Level A - No therapy, no consequence. Localized small to moderate hematoma at the puncture site PROCEDURE: Informed written consent was obtained from the patient after a discussion of the risks, benefits and alternatives to treatment. A timeout was performed prior to the initiation of the procedure. Initial ultrasound scanning demonstrates a large amount of ascites within the right lower abdominal quadrant. The right lower abdomen was prepped and draped in the usual  sterile fashion. 1% lidocaine was used for local anesthesia. Following this, a 19 gauge, 7-cm, Yueh catheter was introduced. An ultrasound image was saved for documentation purposes. The paracentesis was performed. The catheter was removed and a dressing was applied. The patient tolerated the procedure well without immediate post procedural complication. Patient received post-procedure intravenous albumin; see nursing notes for details. FINDINGS: A total of approximately 2.0 L of dark yellow fluid was removed. Samples were sent to the laboratory as requested by the clinical team. IMPRESSION: Successful ultrasound-guided paracentesis yielding 2.0 liters of peritoneal fluid. Performed by Buzzy Han, PA-C Electronically Signed   By: Roanna Banning M.D.   On: 05/12/2023 13:41   CT ABDOMEN PELVIS W CONTRAST  Result Date: 05/08/2023 CLINICAL DATA:  Abdominal pain. History of cirrhosis and pancreatitis. EXAM: CT ABDOMEN AND PELVIS WITH CONTRAST TECHNIQUE: Multidetector CT imaging of the abdomen and pelvis was performed using the standard protocol following bolus administration of intravenous contrast. RADIATION DOSE REDUCTION: This exam was performed according to the departmental dose-optimization program which includes automated exposure control, adjustment of the mA and/or kV according to patient size and/or use of iterative reconstruction technique. CONTRAST:  75mL OMNIPAQUE IOHEXOL 300 MG/ML  SOLN COMPARISON:  CT abdomen and pelvis 03/23/2023 FINDINGS: Lower chest: No acute abnormality. Hepatobiliary: Nodular liver contour is again seen compatible with cirrhosis. Mild diffuse heterogeneity persists. Gallbladder is within normal limits. No biliary ductal dilatation. Pancreas: Unremarkable. No pancreatic ductal dilatation or surrounding inflammatory changes. Spleen: The spleen is enlarged, unchanged. Adrenals/Urinary Tract: There are punctate nonobstructing bilateral renal calculi measuring up to 6 mm. There is no  hydronephrosis or perinephric fluid. The adrenal glands and bladder are within normal limits. Stomach/Bowel: Right colectomy changes are present. Right-sided ostomy is again seen. There is no evidence for bowel obstruction, pneumatosis or free air. There is marked circumferential wall thickening and inflammation of the second portion of the duodenal. Stomach is within normal limits. Vascular/Lymphatic: Aorta and IVC are normal in size. Splenic varices and splenorenal shunt are again noted. Paraesophageal varices are present as well. No enlarged lymph nodes are identified. Reproductive: Prostate is unremarkable. Other: There is a moderate-sized right inguinal hernia containing ascites. There is small volume ascites throughout the abdomen and pelvis. Musculoskeletal: No acute or significant osseous findings. IMPRESSION: 1. Marked circumferential wall thickening and inflammation of the second portion of the duodenum compatible  with duodenitis. Follow-up recommended to exclude underlying process such as neoplasm. 2. Cirrhosis with sequela of portal hypertension including splenomegaly, varices, and small volume ascites. 3. Nonobstructing bilateral renal calculi. 4. Right colectomy changes with right-sided ostomy. 5. Moderate-sized right inguinal hernia containing ascites. Electronically Signed   By: Darliss Cheney M.D.   On: 05/08/2023 22:25   [name] ASCITES (ABDOMEN LIMITED)  Result Date: 05/08/2023 CLINICAL DATA:  57 year old male presenting for paracentesis. EXAM: LIMITED ABDOMEN ULTRASOUND FOR ASCITES TECHNIQUE: Limited ultrasound survey for ascites was performed in all four abdominal quadrants. COMPARISON:  Earlier the same day FINDINGS: Trace right upper and left lower quadrant ascites. No safe window for paracentesis. IMPRESSION: Trace ascites.  No paracentesis performed. Marliss Coots, MD [facility] Electronically Signed   By: Marliss Coots M.D.   On:  05/08/2023 11:59   US Abdomen Complete  Result Date: 05/08/2023 CLINICAL DATA:  Abdominal pain EXAM: ABDOMEN ULTRASOUND COMPLETE COMPARISON:  04/06/2023, CT 03/26/2023 FINDINGS: Gallbladder: The gallbladder wall is markedly thickened, nonspecific in the setting of cirrhosis and ascites. This appears similar to prior examination. The gallbladder, however, is not distended. No intraluminal stones or sludge is identified. The sonographic Eulah Pont sign is reportedly negative. Common bile duct: The extrahepatic bile duct is not well visualized on this examination. Liver: The liver contour is nodular, the liver is shrunken, and the hepatic echotexture is diffusely coarsened in keeping with changes of advanced cirrhosis. No focal intrahepatic mass identified. No intrahepatic biliary ductal dilation. Portal vein is patent on color Doppler imaging with normal direction of blood flow towards the liver. IVC: No abnormality visualized. Pancreas: Not well visualized on this examination Spleen: Moderately enlarged measuring up to 19.1 cm in greatest dimension. No intrasplenic lesions identified. Right Kidney: Length: 9.1 cm. Echogenicity within normal limits. No mass or hydronephrosis visualized. Left Kidney: Length: 10.3 cm. Echogenicity within normal limits. No mass or hydronephrosis visualized. Abdominal aorta: No aneurysm visualized. Other findings: Mild ascites is present, largely perihepatic in nature, similar to prior CT examination. IMPRESSION: 1. Advanced cirrhosis. No focal intrahepatic mass identified. 2. Moderate splenomegaly and mild ascites, similar to prior examination in keeping with changes of portal venous hypertension. 3. Marked gallbladder wall thickening, nonspecific in the setting of cirrhosis and ascites. No intraluminal stones or sludge identified. No definite sonographic evidence of acute cholecystitis. Electronically Signed   By: Helyn Numbers M.D.   On: 05/08/2023 00:32    Labs: BNP (last 3  results) No results for input(s): "BNP" in the last 8760 hours. Basic Metabolic Panel: Recent Labs  Lab 05/08/23 0604 05/09/23 0513 05/10/23 0419 05/11/23 0443 05/12/23 0920  NA 136 129* 133* 129* 131*  K 3.6 3.4* 3.7 3.7 4.0  CL 111 106 110 107 107  CO2 19* 19* 20* 18* 18*  GLUCOSE 155* 130* 167* 160* 206*  BUN 25* 25* 27* 29* 30*  CREATININE 1.87* 1.89* 1.97* 2.06* 1.99*  CALCIUM 8.2* 7.7* 8.1* 8.1* 8.3*  MG  --  1.6* 1.9 2.0  --   PHOS  --  3.9 4.0 3.3  --    Liver Function Tests: Recent Labs  Lab 05/07/23 1852 05/09/23 0513 05/10/23 0419 05/11/23 0443  AST 42* 38 40 36  ALT 20 18 16 16   ALKPHOS 136* 69 73 76  BILITOT 3.6* 2.7* 2.4* 2.7*  PROT 7.8 6.0* 5.9* 5.7*  ALBUMIN 2.5* 2.0* 2.0* 1.9*   Recent Labs  Lab 05/07/23 1852 05/08/23 0604  LIPASE 374* 174*  Recent Labs  Lab 05/08/23 0604  AMMONIA 34   CBC: Recent Labs  Lab 05/07/23 1852 05/08/23 1300 05/09/23 0513 05/09/23 1804 05/10/23 0419 05/10/23 0920 05/11/23 0443 05/12/23 0920  WBC 2.5* 3.3* 1.7*  --  1.6* 1.5* 1.4* 1.6*  NEUTROABS 1.8 2.7  --   --   --  1.0* 0.8*  --   HGB 6.6* 8.2* 6.6* 8.8* 7.8* 7.8* 7.8* 8.4*  HCT 20.3* 25.4* 20.4* 26.8* 23.2* 23.5* 23.2* 24.7*  MCV 102.5* 102.0* 102.5*  --  98.7 97.9 97.1 97.6  PLT 79* 72* 52*  --  47* 48* 45* 58*   Recent Labs  Lab 05/11/23 1147 05/11/23 1623 05/11/23 2135 05/12/23 0730 05/12/23 1132  GLUCAP 166* 157* 177* 129* 163*   Recent Labs    05/11/23 1616  VITAMINB12 1,028*  FOLATE >40.0  FERRITIN 57  TIBC 241*  IRON 100  RETICCTPCT 1.8   Urinalysis    Component Value Date/Time   COLORURINE YELLOW 01/20/2023 0422   APPEARANCEUR CLEAR 01/20/2023 0422   LABSPEC 1.010 01/20/2023 0422   PHURINE 5.5 01/20/2023 0422   GLUCOSEU NEGATIVE 01/20/2023 0422   HGBUR SMALL (A) 01/20/2023 0422   BILIRUBINUR NEGATIVE 01/20/2023 0422   BILIRUBINUR 2+ 05/17/2015 1013   KETONESUR NEGATIVE 01/20/2023 0422   PROTEINUR NEGATIVE 01/20/2023  0422   UROBILINOGEN 0.2 05/17/2015 1013   NITRITE NEGATIVE 01/20/2023 0422   LEUKOCYTESUR NEGATIVE 01/20/2023 0422   Sepsis Labs Recent Labs  Lab 05/10/23 0419 05/10/23 0920 05/11/23 0443 05/12/23 0920  WBC 1.6* 1.5* 1.4* 1.6*   Microbiology No results found for this or any previous visit (from the past 240 hour(s)).   Time coordinating discharge: 35 minutes  SIGNED: Lanae Boast, MD  [facility] 05/12/2023, 3:00 PM  If 7PM-7AM, please contact night-coverage [email or web address]

## 2023-05-12 NOTE — Progress Notes (Signed)
Discharge medications delivered to pt in a secure bag - given to pt by this RN- placed in belonging bag

## 2023-05-12 NOTE — Progress Notes (Signed)
Patient discharged: Home  Via: Wheelchair   Discharge paperwork given: to patient and family  Reviewed with teach back  IV removed  Belongings given to patient

## 2023-05-17 LAB — ACID FAST SMEAR (AFB, MYCOBACTERIA): Acid Fast Smear: NEGATIVE

## 2023-05-17 LAB — AEROBIC/ANAEROBIC CULTURE W GRAM STAIN (SURGICAL/DEEP WOUND): Culture: NO GROWTH

## 2023-05-17 LAB — PATHOLOGIST SMEAR REVIEW

## 2023-05-20 ENCOUNTER — Encounter (HOSPITAL_COMMUNITY): Payer: Self-pay

## 2023-05-20 ENCOUNTER — Emergency Department (HOSPITAL_COMMUNITY): Payer: Medicaid Other

## 2023-05-20 ENCOUNTER — Inpatient Hospital Stay (HOSPITAL_COMMUNITY)
Admission: EM | Admit: 2023-05-20 | Discharge: 2023-05-26 | DRG: 377 | Disposition: A | Discharge status: Home / Self Care | Payer: Medicaid Other | Attending: Family Medicine | Admitting: Family Medicine

## 2023-05-20 ENCOUNTER — Other Ambulatory Visit: Payer: Self-pay

## 2023-05-20 DIAGNOSIS — N179 Acute kidney failure, unspecified: Secondary | ICD-10-CM | POA: Diagnosis not present

## 2023-05-20 DIAGNOSIS — N1832 Chronic kidney disease, stage 3b: Secondary | ICD-10-CM

## 2023-05-20 DIAGNOSIS — Z9049 Acquired absence of other specified parts of digestive tract: Secondary | ICD-10-CM

## 2023-05-20 DIAGNOSIS — Z87891 Personal history of nicotine dependence: Secondary | ICD-10-CM

## 2023-05-20 DIAGNOSIS — D649 Anemia, unspecified: Secondary | ICD-10-CM | POA: Diagnosis present

## 2023-05-20 DIAGNOSIS — I959 Hypotension, unspecified: Secondary | ICD-10-CM | POA: Diagnosis present

## 2023-05-20 DIAGNOSIS — E872 Acidosis, unspecified: Secondary | ICD-10-CM | POA: Diagnosis not present

## 2023-05-20 DIAGNOSIS — D62 Acute posthemorrhagic anemia: Secondary | ICD-10-CM | POA: Diagnosis present

## 2023-05-20 DIAGNOSIS — E875 Hyperkalemia: Secondary | ICD-10-CM | POA: Diagnosis present

## 2023-05-20 DIAGNOSIS — Z79899 Other long term (current) drug therapy: Secondary | ICD-10-CM

## 2023-05-20 DIAGNOSIS — D61818 Other pancytopenia: Secondary | ICD-10-CM | POA: Diagnosis not present

## 2023-05-20 DIAGNOSIS — I10 Essential (primary) hypertension: Secondary | ICD-10-CM

## 2023-05-20 DIAGNOSIS — E785 Hyperlipidemia, unspecified: Secondary | ICD-10-CM | POA: Diagnosis present

## 2023-05-20 DIAGNOSIS — Z5982 Transportation insecurity: Secondary | ICD-10-CM

## 2023-05-20 DIAGNOSIS — C189 Malignant neoplasm of colon, unspecified: Secondary | ICD-10-CM | POA: Diagnosis present

## 2023-05-20 DIAGNOSIS — Z8249 Family history of ischemic heart disease and other diseases of the circulatory system: Secondary | ICD-10-CM

## 2023-05-20 DIAGNOSIS — Z85038 Personal history of other malignant neoplasm of large intestine: Secondary | ICD-10-CM

## 2023-05-20 DIAGNOSIS — K31811 Angiodysplasia of stomach and duodenum with bleeding: Principal | ICD-10-CM | POA: Diagnosis present

## 2023-05-20 DIAGNOSIS — K2981 Duodenitis with bleeding: Secondary | ICD-10-CM | POA: Diagnosis not present

## 2023-05-20 DIAGNOSIS — K922 Gastrointestinal hemorrhage, unspecified: Principal | ICD-10-CM | POA: Diagnosis present

## 2023-05-20 DIAGNOSIS — K766 Portal hypertension: Secondary | ICD-10-CM | POA: Diagnosis present

## 2023-05-20 DIAGNOSIS — K767 Hepatorenal syndrome: Secondary | ICD-10-CM | POA: Diagnosis present

## 2023-05-20 DIAGNOSIS — K219 Gastro-esophageal reflux disease without esophagitis: Secondary | ICD-10-CM | POA: Diagnosis present

## 2023-05-20 DIAGNOSIS — K7031 Alcoholic cirrhosis of liver with ascites: Secondary | ICD-10-CM | POA: Diagnosis not present

## 2023-05-20 DIAGNOSIS — E1122 Type 2 diabetes mellitus with diabetic chronic kidney disease: Secondary | ICD-10-CM | POA: Diagnosis present

## 2023-05-20 DIAGNOSIS — Z886 Allergy status to analgesic agent status: Secondary | ICD-10-CM

## 2023-05-20 DIAGNOSIS — E1149 Type 2 diabetes mellitus with other diabetic neurological complication: Secondary | ICD-10-CM

## 2023-05-20 DIAGNOSIS — I480 Paroxysmal atrial fibrillation: Secondary | ICD-10-CM

## 2023-05-20 DIAGNOSIS — Z933 Colostomy status: Secondary | ICD-10-CM

## 2023-05-20 DIAGNOSIS — D500 Iron deficiency anemia secondary to blood loss (chronic): Secondary | ICD-10-CM

## 2023-05-20 DIAGNOSIS — E1165 Type 2 diabetes mellitus with hyperglycemia: Secondary | ICD-10-CM | POA: Diagnosis present

## 2023-05-20 DIAGNOSIS — K7210 Chronic hepatic failure without coma: Secondary | ICD-10-CM | POA: Diagnosis present

## 2023-05-20 DIAGNOSIS — Z833 Family history of diabetes mellitus: Secondary | ICD-10-CM

## 2023-05-20 DIAGNOSIS — E871 Hypo-osmolality and hyponatremia: Secondary | ICD-10-CM | POA: Diagnosis not present

## 2023-05-20 DIAGNOSIS — K859 Acute pancreatitis without necrosis or infection, unspecified: Secondary | ICD-10-CM | POA: Diagnosis present

## 2023-05-20 DIAGNOSIS — N183 Chronic kidney disease, stage 3 unspecified: Secondary | ICD-10-CM | POA: Diagnosis present

## 2023-05-20 DIAGNOSIS — D509 Iron deficiency anemia, unspecified: Secondary | ICD-10-CM | POA: Diagnosis present

## 2023-05-20 DIAGNOSIS — D630 Anemia in neoplastic disease: Secondary | ICD-10-CM | POA: Diagnosis present

## 2023-05-20 LAB — COMPREHENSIVE METABOLIC PANEL
ALT: 18 U/L (ref 0–44)
AST: 40 U/L (ref 15–41)
Albumin: 2.3 g/dL — ABNORMAL LOW (ref 3.5–5.0)
Alkaline Phosphatase: 113 U/L (ref 38–126)
Anion gap: 7 (ref 5–15)
BUN: 50 mg/dL — ABNORMAL HIGH (ref 6–20)
CO2: 16 mmol/L — ABNORMAL LOW (ref 22–32)
Calcium: 8.7 mg/dL — ABNORMAL LOW (ref 8.9–10.3)
Chloride: 105 mmol/L (ref 98–111)
Creatinine, Ser: 1.92 mg/dL — ABNORMAL HIGH (ref 0.61–1.24)
GFR, Estimated: 40 mL/min — ABNORMAL LOW (ref >60)
Glucose, Bld: 502 mg/dL (ref 70–99)
Potassium: 5.2 mmol/L — ABNORMAL HIGH (ref 3.5–5.1)
Sodium: 128 mmol/L — ABNORMAL LOW (ref 135–145)
Total Bilirubin: 2.7 mg/dL — ABNORMAL HIGH (ref <1.2)
Total Protein: 6.6 g/dL (ref 6.5–8.1)

## 2023-05-20 LAB — CBC WITH DIFFERENTIAL/PLATELET
Abs Immature Granulocytes: 0.04 10*3/uL (ref 0.00–0.07)
Basophils Absolute: 0 10*3/uL (ref 0.0–0.1)
Basophils Relative: 1 %
Eosinophils Absolute: 0.1 10*3/uL (ref 0.0–0.5)
Eosinophils Relative: 3 %
HCT: 25.7 % — ABNORMAL LOW (ref 39.0–52.0)
Hemoglobin: 8.3 g/dL — ABNORMAL LOW (ref 13.0–17.0)
Immature Granulocytes: 1 %
Lymphocytes Relative: 11 %
Lymphs Abs: 0.5 10*3/uL — ABNORMAL LOW (ref 0.7–4.0)
MCH: 32 pg (ref 26.0–34.0)
MCHC: 32.3 g/dL (ref 30.0–36.0)
MCV: 99.2 fL (ref 80.0–100.0)
Monocytes Absolute: 0.4 10*3/uL (ref 0.1–1.0)
Monocytes Relative: 9 %
Neutro Abs: 3 10*3/uL (ref 1.7–7.7)
Neutrophils Relative %: 75 %
Platelets: 87 10*3/uL — ABNORMAL LOW (ref 150–400)
RBC: 2.59 MIL/uL — ABNORMAL LOW (ref 4.22–5.81)
RDW: 14.7 % (ref 11.5–15.5)
WBC: 4 10*3/uL (ref 4.0–10.5)
nRBC: 0 % (ref 0.0–0.2)

## 2023-05-20 LAB — URINALYSIS, ROUTINE W REFLEX MICROSCOPIC
Bacteria, UA: NONE SEEN
Bilirubin Urine: NEGATIVE
Glucose, UA: >=500 mg/dL — AB
Hgb urine dipstick: NEGATIVE
Ketones, ur: NEGATIVE mg/dL
Leukocytes,Ua: NEGATIVE
Nitrite: NEGATIVE
Protein, ur: NEGATIVE mg/dL
Specific Gravity, Urine: 1.035 — ABNORMAL HIGH (ref 1.005–1.030)
pH: 5 (ref 5.0–8.0)

## 2023-05-20 LAB — CBG MONITORING, ED: Glucose-Capillary: 420 mg/dL — ABNORMAL HIGH (ref 70–99)

## 2023-05-20 LAB — BLOOD GAS, VENOUS
Acid-base deficit: 9.6 mmol/L — ABNORMAL HIGH (ref 0.0–2.0)
Bicarbonate: 15.7 mmol/L — ABNORMAL LOW (ref 20.0–28.0)
O2 Saturation: 63 %
Patient temperature: 37
pCO2, Ven: 32 mm[Hg] — ABNORMAL LOW (ref 44–60)
pH, Ven: 7.3 (ref 7.25–7.43)
pO2, Ven: 38 mm[Hg] (ref 32–45)

## 2023-05-20 LAB — PROTIME-INR
INR: 1.7 — ABNORMAL HIGH (ref 0.8–1.2)
Prothrombin Time: 20.4 s — ABNORMAL HIGH (ref 11.4–15.2)

## 2023-05-20 LAB — LIPASE, BLOOD: Lipase: 438 U/L — ABNORMAL HIGH (ref 11–51)

## 2023-05-20 LAB — GLUCOSE, CAPILLARY: Glucose-Capillary: 350 mg/dL — ABNORMAL HIGH (ref 70–99)

## 2023-05-20 LAB — HEMOGLOBIN AND HEMATOCRIT, BLOOD
HCT: 23.3 % — ABNORMAL LOW (ref 39.0–52.0)
Hemoglobin: 7.7 g/dL — ABNORMAL LOW (ref 13.0–17.0)

## 2023-05-20 MED ORDER — GABAPENTIN 100 MG PO CAPS
100.0000 mg | ORAL_CAPSULE | Freq: Three times a day (TID) | ORAL | Status: DC
  Administered 2023-05-21 – 2023-05-26 (×16): 100 mg via ORAL
  Filled 2023-05-20 (×17): qty 1

## 2023-05-20 MED ORDER — INSULIN ASPART 100 UNIT/ML IJ SOLN
10.0000 [IU] | Freq: Once | INTRAMUSCULAR | Status: AC
  Administered 2023-05-20: 10 [IU] via SUBCUTANEOUS
  Filled 2023-05-20: qty 0.1

## 2023-05-20 MED ORDER — INSULIN ASPART 100 UNIT/ML IJ SOLN
0.0000 [IU] | INTRAMUSCULAR | Status: DC
  Administered 2023-05-20: 7 [IU] via SUBCUTANEOUS
  Administered 2023-05-21 (×2): 2 [IU] via SUBCUTANEOUS
  Administered 2023-05-21: 3 [IU] via SUBCUTANEOUS
  Administered 2023-05-21 – 2023-05-22 (×3): 1 [IU] via SUBCUTANEOUS
  Administered 2023-05-22 (×3): 2 [IU] via SUBCUTANEOUS
  Administered 2023-05-23 (×2): 1 [IU] via SUBCUTANEOUS
  Administered 2023-05-23: 2 [IU] via SUBCUTANEOUS
  Administered 2023-05-23: 1 [IU] via SUBCUTANEOUS
  Administered 2023-05-23: 2 [IU] via SUBCUTANEOUS
  Administered 2023-05-24: 1 [IU] via SUBCUTANEOUS
  Administered 2023-05-24: 2 [IU] via SUBCUTANEOUS
  Administered 2023-05-24 – 2023-05-25 (×4): 1 [IU] via SUBCUTANEOUS
  Administered 2023-05-25 (×2): 2 [IU] via SUBCUTANEOUS
  Administered 2023-05-26: 1 [IU] via SUBCUTANEOUS
  Filled 2023-05-20: qty 0.09

## 2023-05-20 MED ORDER — ONDANSETRON HCL 4 MG/2ML IJ SOLN
4.0000 mg | Freq: Once | INTRAMUSCULAR | Status: AC
  Administered 2023-05-20: 4 mg via INTRAVENOUS
  Filled 2023-05-20: qty 2

## 2023-05-20 MED ORDER — FERROUS SULFATE 325 (65 FE) MG PO TABS
325.0000 mg | ORAL_TABLET | Freq: Every day | ORAL | Status: DC
  Administered 2023-05-21 – 2023-05-26 (×6): 325 mg via ORAL
  Filled 2023-05-20 (×6): qty 1

## 2023-05-20 MED ORDER — INSULIN ASPART PROT & ASPART (70-30 MIX) 100 UNIT/ML ~~LOC~~ SUSP: 10.0000 [IU] | Freq: Once | SUBCUTANEOUS | Status: DC

## 2023-05-20 MED ORDER — PANTOPRAZOLE SODIUM 40 MG IV SOLR
40.0000 mg | Freq: Two times a day (BID) | INTRAVENOUS | Status: DC
  Administered 2023-05-21 – 2023-05-26 (×11): 40 mg via INTRAVENOUS
  Filled 2023-05-20 (×11): qty 10

## 2023-05-20 MED ORDER — SODIUM BICARBONATE 650 MG PO TABS
650.0000 mg | ORAL_TABLET | Freq: Every day | ORAL | Status: DC
  Administered 2023-05-21 – 2023-05-26 (×6): 650 mg via ORAL
  Filled 2023-05-20 (×6): qty 1

## 2023-05-20 MED ORDER — SERTRALINE HCL 50 MG PO TABS
50.0000 mg | ORAL_TABLET | Freq: Every day | ORAL | Status: DC
  Administered 2023-05-21 – 2023-05-26 (×6): 50 mg via ORAL
  Filled 2023-05-20 (×6): qty 1

## 2023-05-20 MED ORDER — MIDODRINE HCL 5 MG PO TABS
5.0000 mg | ORAL_TABLET | Freq: Three times a day (TID) | ORAL | Status: DC
  Administered 2023-05-21 – 2023-05-26 (×17): 5 mg via ORAL
  Filled 2023-05-20 (×17): qty 1

## 2023-05-20 MED ORDER — NICOTINE 14 MG/24HR TD PT24
14.0000 mg | MEDICATED_PATCH | Freq: Every day | TRANSDERMAL | Status: DC
  Administered 2023-05-20 – 2023-05-26 (×7): 14 mg via TRANSDERMAL
  Filled 2023-05-20 (×7): qty 1

## 2023-05-20 MED ORDER — FOLIC ACID 1 MG PO TABS
1.0000 mg | ORAL_TABLET | Freq: Every day | ORAL | Status: DC
Start: 2023-05-21 — End: 2023-05-26
  Administered 2023-05-21 – 2023-05-26 (×6): 1 mg via ORAL
  Filled 2023-05-20 (×6): qty 1

## 2023-05-20 MED ORDER — IOHEXOL 350 MG/ML SOLN
80.0000 mL | Freq: Once | INTRAVENOUS | Status: AC | PRN
  Administered 2023-05-20: 80 mL via INTRAVENOUS

## 2023-05-20 MED ORDER — RIFAXIMIN 550 MG PO TABS
550.0000 mg | ORAL_TABLET | Freq: Two times a day (BID) | ORAL | Status: DC
  Administered 2023-05-20 – 2023-05-26 (×12): 550 mg via ORAL
  Filled 2023-05-20 (×13): qty 1

## 2023-05-20 MED ORDER — NADOLOL 20 MG PO TABS
20.0000 mg | ORAL_TABLET | Freq: Every day | ORAL | Status: DC
  Administered 2023-05-21 – 2023-05-24 (×3): 20 mg via ORAL
  Filled 2023-05-20 (×4): qty 1

## 2023-05-20 MED ORDER — ONDANSETRON HCL 4 MG PO TABS: 4.0000 mg | ORAL_TABLET | Freq: Four times a day (QID) | ORAL | Status: DC | PRN

## 2023-05-20 MED ORDER — PANTOPRAZOLE SODIUM 40 MG IV SOLR
80.0000 mg | Freq: Once | INTRAVENOUS | Status: AC
  Administered 2023-05-20: 80 mg via INTRAVENOUS
  Filled 2023-05-20: qty 20

## 2023-05-20 MED ORDER — SODIUM CHLORIDE 0.9 % IV SOLN
50.0000 ug/h | INTRAVENOUS | Status: DC
  Administered 2023-05-21 – 2023-05-25 (×11): 50 ug/h via INTRAVENOUS
  Filled 2023-05-20 (×14): qty 1

## 2023-05-20 MED ORDER — SODIUM CHLORIDE 0.9 % IV BOLUS
1000.0000 mL | Freq: Once | INTRAVENOUS | Status: AC
  Administered 2023-05-20: 1000 mL via INTRAVENOUS

## 2023-05-20 MED ORDER — SUCRALFATE 1 G PO TABS
1.0000 g | ORAL_TABLET | Freq: Three times a day (TID) | ORAL | Status: DC
  Administered 2023-05-20 – 2023-05-26 (×22): 1 g via ORAL
  Filled 2023-05-20 (×22): qty 1

## 2023-05-20 MED ORDER — MORPHINE SULFATE (PF) 2 MG/ML IV SOLN
2.0000 mg | Freq: Once | INTRAVENOUS | Status: AC
  Administered 2023-05-20: 2 mg via INTRAVENOUS
  Filled 2023-05-20: qty 1

## 2023-05-20 MED ORDER — PANTOPRAZOLE SODIUM 40 MG IV SOLR
40.0000 mg | INTRAVENOUS | Status: AC
  Administered 2023-05-20: 40 mg via INTRAVENOUS
  Filled 2023-05-20: qty 10

## 2023-05-20 MED ORDER — MORPHINE SULFATE (PF) 4 MG/ML IV SOLN
4.0000 mg | Freq: Once | INTRAVENOUS | Status: AC
  Administered 2023-05-20: 4 mg via INTRAVENOUS
  Filled 2023-05-20: qty 1

## 2023-05-20 MED ORDER — ONDANSETRON HCL 4 MG/2ML IJ SOLN: 4.0000 mg | Freq: Four times a day (QID) | INTRAMUSCULAR | Status: DC | PRN

## 2023-05-20 MED ORDER — NYSTATIN 100000 UNIT/GM EX POWD
1.0000 | Freq: Three times a day (TID) | CUTANEOUS | Status: DC
  Administered 2023-05-21 – 2023-05-25 (×7): 1 via TOPICAL
  Filled 2023-05-20 (×2): qty 15

## 2023-05-20 MED ORDER — MORPHINE SULFATE (PF) 2 MG/ML IV SOLN
2.0000 mg | INTRAVENOUS | Status: DC | PRN
  Administered 2023-05-20 – 2023-05-26 (×29): 2 mg via INTRAVENOUS
  Filled 2023-05-20 (×30): qty 1

## 2023-05-20 MED ORDER — GABAPENTIN 300 MG PO CAPS
300.0000 mg | ORAL_CAPSULE | Freq: Every day | ORAL | Status: DC
  Administered 2023-05-20 – 2023-05-25 (×6): 300 mg via ORAL
  Filled 2023-05-20 (×6): qty 1

## 2023-05-20 MED ORDER — THIAMINE MONONITRATE 100 MG PO TABS
100.0000 mg | ORAL_TABLET | Freq: Every day | ORAL | Status: DC
  Administered 2023-05-21 – 2023-05-26 (×6): 100 mg via ORAL
  Filled 2023-05-20 (×7): qty 1

## 2023-05-20 NOTE — ED Notes (Signed)
ED TO INPATIENT HANDOFF REPORT  ED Nurse Name and Phone #: Thurmon Fair. Manson Passey [phone]  S Name/Age/Gender Martin Anthony 57 y.o. male Room/Bed: WA02/WA02  Code Status   Code Status: Full Code  Home/SNF/Other Home Patient oriented to: self, place, time, and situation Is this baseline? Yes   Triage Complete: Triage complete  Chief Complaint GI bleed [K92.2]  Triage Note Pt BIB [facility] from home. Pt with colostomy with bloody output that started today. EMS states pt emptied bag with blood x4 today. EMS also reports pt is hyperglycemic 499. EMS admin 4mg  of Zofran IV.   EMS Vitals  138/83 HR 70 SpO2 99% on R/A.    Allergies Allergies  Allergen Reactions   Acetaminophen Other (See Comments)    States he was told not to take it- Contraindicated for condition regarding the liver   Ibuprofen Other (See Comments)    Liver sensitivity- Contraindicated for condition regarding the liver  MD told the patient he may take this in very low doses in 04/2023    Level of Care/Admitting Diagnosis ED Disposition     ED Disposition  Admit   Condition  --   Comment  Hospital Area: [facility] [[id number]]  Level of Care: Progressive [102]  Admit to Progressive based on following criteria: GI, ENDOCRINE disease patients with GI bleeding, acute liver failure or pancreatitis, stable with diabetic ketoacidosis or thyrotoxicosis (hypothyroid) state.  May place patient in observation at [facility] or [facility] if equivalent level of care is available:: No  Covid Evaluation: Asymptomatic - no recent exposure (last 10 days) testing not required  Diagnosis: GI bleed [109323]  Admitting Physician: Hillary Bow [[id number]]  Attending Physician: Hillary Bow [[id number]]          B Medical/Surgery History Past Medical History:  Diagnosis Date   Anxiety    Ascites due to alcoholic cirrhosis (HCC)    Cirrhosis with alcoholism (HCC)    Depression    Diabetes mellitus without  complication (HCC)    GERD (gastroesophageal reflux disease)    Gout    Hyperlipidemia    Hypertension    Pancreatitis    Primary adenocarcinoma of ascending colon (HCC)    Substance abuse (HCC)    alcoholism    Type 2 diabetes mellitus with diabetic neuropathy (HCC)    Past Surgical History:  Procedure Laterality Date   BIOPSY  01/19/2019   Procedure: BIOPSY;  Surgeon: Jeani Hawking, MD;  Location: [facility];  Service: Endoscopy;;   BIOPSY  05/09/2023   Procedure: BIOPSY;  Surgeon: Kathi Der, MD;  Location: [facility];  Service: Gastroenterology;;   COLON SURGERY  10/25/2021   right hemicolectomy with ostomy at [facility] in [place]   COLONOSCOPY N/A 01/19/2019   Procedure: COLONOSCOPY;  Surgeon: Jeani Hawking, MD;  Location: [facility];  Service: Endoscopy;  Laterality: N/A;   ESOPHAGEAL BANDING  03/25/2023   Procedure: ESOPHAGEAL BANDING;  Surgeon: Kerin Salen, MD;  Location: [facility];  Service: Gastroenterology;;   ESOPHAGOGASTRODUODENOSCOPY (EGD) WITH PROPOFOL N/A 01/19/2019   Procedure: ESOPHAGOGASTRODUODENOSCOPY (EGD) WITH PROPOFOL;  Surgeon: Jeani Hawking, MD;  Location: [facility];  Service: Endoscopy;  Laterality: N/A;   ESOPHAGOGASTRODUODENOSCOPY (EGD) WITH PROPOFOL N/A 03/25/2023   Procedure: ESOPHAGOGASTRODUODENOSCOPY (EGD) WITH PROPOFOL;  Surgeon: Kerin Salen, MD;  Location: [facility];  Service: Gastroenterology;  Laterality: N/A;   ESOPHAGOGASTRODUODENOSCOPY (EGD) WITH PROPOFOL N/A 05/09/2023   Procedure: ESOPHAGOGASTRODUODENOSCOPY (EGD) WITH PROPOFOL;  Surgeon: Kathi Der, MD;  Location: [facility];  Service: Gastroenterology;  Laterality: N/A;   HEMOSTASIS CLIP PLACEMENT  01/19/2019   Procedure: HEMOSTASIS CLIP PLACEMENT;  Surgeon: Jeani Hawking, MD;  Location: [facility];  Service: Endoscopy;;   HERNIA REPAIR     umbilical    ILEOSCOPY N/A 03/25/2023   Procedure: ILEOSCOPY THROUGH STOMA;  Surgeon: Kerin Salen, MD;  Location: [facility];  Service: Gastroenterology;  Laterality: N/A;   INGUINAL HERNIA REPAIR Bilateral    IR FLUORO GUIDE CV LINE RIGHT  05/13/2021   IR PARACENTESIS  05/19/2021   IR US GUIDE VASC ACCESS RIGHT  05/13/2021   POLYPECTOMY  01/19/2019   Procedure: POLYPECTOMY;  Surgeon: Jeani Hawking, MD;  Location: [facility];  Service: Endoscopy;;     A IV Location/Drains/Wounds Patient Lines/Drains/Airways Status     Active Line/Drains/Airways     Name Placement date Placement time Site Days   Peripheral IV 05/20/23 20 G Anterior;Left Forearm 05/20/23  1522  Forearm  less than 1   Ileostomy RUQ --  --  RUQ  --   Wound / Incision (Open or Dehisced) 03/27/23 Skin tear Thigh Distal;Left;Posterior 03/27/23  0615  Thigh  54            Intake/Output Last 24 hours  Intake/Output Summary (Last 24 hours) at 05/20/2023 2131 Last data filed at 05/20/2023 2040 Gross per 24 hour  Intake 1000 ml  Output --  Net 1000 ml    Labs/Imaging Results for orders placed or performed during the hospital encounter of 05/20/23 (from the past 48 hour(s))  CBC with Differential     Status: Abnormal   Collection Time: 05/20/23  3:34 PM  Result Value Ref Range   WBC 4.0 4.0 - 10.5 K/uL   RBC 2.59 (L) 4.22 - 5.81 MIL/uL   Hemoglobin 8.3 (L) 13.0 - 17.0 g/dL   HCT 66.4 (L) 40.3 - 47.4 %   MCV 99.2 80.0 - 100.0 fL   MCH 32.0 26.0 - 34.0 pg   MCHC 32.3 30.0 - 36.0 g/dL   RDW 25.9 56.3 - 87.5 %   Platelets 87 (L) 150 - 400 K/uL    Comment: SPECIMEN CHECKED FOR CLOTS Immature Platelet Fraction may be clinically indicated, consider ordering this additional test [id number] REPEATED TO VERIFY    nRBC 0.0 0.0 - 0.2 %   Neutrophils Relative % 75 %   Neutro Abs 3.0 1.7 - 7.7 K/uL   Lymphocytes Relative 11 %   Lymphs Abs 0.5 (L) 0.7 - 4.0 K/uL   Monocytes Relative 9 %   Monocytes Absolute 0.4 0.1 - 1.0 K/uL   Eosinophils Relative 3 %   Eosinophils Absolute 0.1 0.0 - 0.5 K/uL   Basophils Relative 1 %    Basophils Absolute 0.0 0.0 - 0.1 K/uL   Immature Granulocytes 1 %   Abs Immature Granulocytes 0.04 0.00 - 0.07 K/uL    Comment: Performed at [facility], [address], [place], [place] [address]  Comprehensive metabolic panel     Status: Abnormal   Collection Time: 05/20/23  3:34 PM  Result Value Ref Range   Sodium 128 (L) 135 - 145 mmol/L   Potassium 5.2 (H) 3.5 - 5.1 mmol/L   Chloride 105 98 - 111 mmol/L   CO2 16 (L) 22 - 32 mmol/L   Glucose, Bld 502 (HH) 70 - 99 mg/dL    Comment: CRITICAL RESULT CALLED TO, READ BACK BY AND VERIFIED WITH C.KISER, RN AT 1704 ON 12.05.24 BY N.THOMPSON Glucose reference range applies  only to samples taken after fasting for at least 8 hours.    BUN 50 (H) 6 - 20 mg/dL   Creatinine, Ser 8.65 (H) 0.61 - 1.24 mg/dL   Calcium 8.7 (L) 8.9 - 10.3 mg/dL   Total Protein 6.6 6.5 - 8.1 g/dL   Albumin 2.3 (L) 3.5 - 5.0 g/dL   AST 40 15 - 41 U/L   ALT 18 0 - 44 U/L   Alkaline Phosphatase 113 38 - 126 U/L   Total Bilirubin 2.7 (H) <1.2 mg/dL   GFR, Estimated 40 (L) >60 mL/min    Comment: (NOTE) Calculated using the CKD-EPI Creatinine Equation (2021)    Anion gap 7 5 - 15    Comment: Performed at [facility], [address] [place], [place] [address]  Lipase, blood     Status: Abnormal   Collection Time: 05/20/23  3:34 PM  Result Value Ref Range   Lipase 438 (H) 11 - 51 U/L    Comment: RESULTS CONFIRMED BY MANUAL DILUTION Performed at [facility], [address] [place], [place] [address]   Protime-INR     Status: Abnormal   Collection Time: 05/20/23  3:34 PM  Result Value Ref Range   Prothrombin Time 20.4 (H) 11.4 - 15.2 seconds   INR 1.7 (H) 0.8 - 1.2    Comment: (NOTE) INR goal varies based on device and disease states. Performed at [facility], [address], [place], [place] [address]   Type and screen [facility]     Status: None   Collection  Time: 05/20/23  3:34 PM  Result Value Ref Range   ABO/RH(D) O POS    Antibody Screen NEG    Sample Expiration      05/23/2023,2359 Performed at [facility], [address] [place], [place] [address]   Blood gas, venous (at [facility] and [facility])     Status: Abnormal   Collection Time: 05/20/23  7:41 PM  Result Value Ref Range   pH, Ven 7.3 7.25 - 7.43   pCO2, Ven 32 (L) 44 - 60 mmHg   pO2, Ven 38 32 - 45 mmHg   Bicarbonate 15.7 (L) 20.0 - 28.0 mmol/L   Acid-base deficit 9.6 (H) 0.0 - 2.0 mmol/L   O2 Saturation 63 %   Patient temperature 37.0     Comment: Performed at [facility], [address] [place], [place] [address]  POC CBG, ED     Status: Abnormal   Collection Time: 05/20/23  8:17 PM  Result Value Ref Range   Glucose-Capillary 420 (H) 70 - 99 mg/dL    Comment: Glucose reference range applies only to samples taken after fasting for at least 8 hours.  Urinalysis, Routine w reflex microscopic -Urine, Clean Catch     Status: Abnormal   Collection Time: 05/20/23  8:22 PM  Result Value Ref Range   Color, Urine YELLOW YELLOW   APPearance CLEAR CLEAR   Specific Gravity, Urine 1.035 (H) 1.005 - 1.030   pH 5.0 5.0 - 8.0   Glucose, UA >=500 (A) NEGATIVE mg/dL   Hgb urine dipstick NEGATIVE NEGATIVE   Bilirubin Urine NEGATIVE NEGATIVE   Ketones, ur NEGATIVE NEGATIVE mg/dL   Protein, ur NEGATIVE NEGATIVE mg/dL   Nitrite NEGATIVE NEGATIVE   Leukocytes,Ua NEGATIVE NEGATIVE   RBC / HPF 0-5 0 - 5 RBC/hpf   WBC, UA 0-5 0 - 5 WBC/hpf   Bacteria, UA NONE  SEEN NONE SEEN   Squamous Epithelial / HPF 0-5 0 - 5 /HPF    Comment: Performed at [facility], [address], [place], [place] [address]   CT Angio Abd/Pel W and/or Wo Contrast  Result Date: 05/20/2023 CLINICAL DATA:  Lower GI bleed. EXAM: CTA ABDOMEN AND PELVIS WITHOUT AND WITH CONTRAST TECHNIQUE: Multidetector CT imaging of the abdomen and pelvis was performed using the standard  protocol during bolus administration of intravenous contrast. Multiplanar reconstructed images and MIPs were obtained and reviewed to evaluate the vascular anatomy. RADIATION DOSE REDUCTION: This exam was performed according to the departmental dose-optimization program which includes automated exposure control, adjustment of the mA and/or kV according to patient size and/or use of iterative reconstruction technique. CONTRAST:  80mL OMNIPAQUE IOHEXOL 350 MG/ML SOLN COMPARISON:  05/08/2023 FINDINGS: VASCULAR Aorta: Aortic atherosclerotic calcifications. Normal caliber aorta without aneurysm, dissection, vasculitis or significant stenosis. Celiac: Patent without evidence of aneurysm, dissection, vasculitis or significant stenosis. SMA: Patent without evidence of aneurysm, dissection, vasculitis or significant stenosis. Renals: Both renal arteries are patent without evidence of aneurysm, dissection, vasculitis, fibromuscular dysplasia or significant stenosis. IMA: Patent without evidence of aneurysm, dissection, vasculitis or significant stenosis. Inflow: Patent without evidence of aneurysm, dissection, vasculitis or significant stenosis. Proximal Outflow: Bilateral common femoral and visualized portions of the superficial and profunda femoral arteries are patent without evidence of aneurysm, dissection, vasculitis or significant stenosis. Veins: Patent. Left abdominal Porto systemic varix is identified, image 49/19. Distal esophageal varices. Review of the MIP images confirms the above findings. NON-VASCULAR Lower chest: No acute abnormality. Hepatobiliary: The uterus appears atrophic with diffuse Leigh nodular contour compatible with advanced cirrhosis. Too small to characterize low-density structure within segment 4 measures 6 mm, image 13/19. No enhancing liver lesions identified. There is mild diffuse bladder wall thickening which measures up to 5 mm, nonspecific in the setting of advanced cirrhosis and portal  venous hypertension. No gallstones identified. No signs of bile duct dilatation. Pancreas: Unremarkable. No pancreatic ductal dilatation or surrounding inflammatory changes. Spleen: Splenomegaly. Spleen measures 15.2 cm in cranial caudal dimension. Adrenals/Urinary Tract: Normal appearance of the adrenal glands. Nonobstructing stone within the inferior pole of the right kidney measures 5 mm. Several left renal calculi are also noted which measure up to 3 mm. No signs of kidney mass or obstructive uropathy. Urinary bladder appears normal. Stomach/Bowel: Stomach appears nondistended. Mild wall thickening with surrounding soft tissue stranding is noted about the descending portion of the duodenum. Right-sided paramidline ileostomy the is identified. No pathologic dilatation of the large or small bowel loops. postoperative changes from right hemicolectomy. Long segment of Hartmann's pouch is identified which terminates in the right upper quadrant of the abdomen.No signs of active intraluminal contrast extravasation within the large or small bowel loops to indicate site of suspected GI bleed. Lymphatic: No significant vascular findings are present. No enlarged abdominal or pelvic lymph nodes. Reproductive: Prostate is unremarkable. Other: Trace free fluid noted within the pelvis, image 75/19. No focal fluid collections identified to suggest abscess. Musculoskeletal: No acute or significant osseous findings. IMPRESSION: 1. No signs of active intraluminal contrast extravasation within the large or small bowel loops to indicate site of suspected GI bleed. 2. Advanced cirrhosis with signs of portal venous hypertension including splenomegaly, left abdominal Porto systemic varix and distal esophageal varices. 3. Wall thickening with surrounding soft tissue stranding about the descending portion of the duodenum. Findings are nonspecific in the setting of advanced cirrhosis and portal venous hypertension. Correlate for  any  clinical signs or symptoms of duodenitis 4. Nonobstructing bilateral renal calculi. 5. Aortic Atherosclerosis (ICD10-I70.0). Electronically Signed   By: Signa Kell M.D.   On: 05/20/2023 19:17    Pending Labs Unresulted Labs (From admission, onward)     Start     Ordered   05/21/23 0500  CBC  Tomorrow morning,   R        05/20/23 2118   05/21/23 0500  Comprehensive metabolic panel  Tomorrow morning,   R        05/20/23 2120   05/21/23 0001  Hemoglobin and hematocrit, blood  Once-Timed,   STAT        05/20/23 2108   05/20/23 2101  Ammonia  Once,   STAT        05/20/23 2100            Vitals/Pain Today's Vitals   05/20/23 1855 05/20/23 1900 05/20/23 1915 05/20/23 2047  BP:  134/79    Pulse:  70    Resp:  20    Temp:   98.3 F (36.8 C)   TempSrc:   Oral   SpO2:  100%    Weight:      Height:      PainSc: 7    5     Isolation Precautions No active isolations  Medications Medications  rifaximin (XIFAXAN) tablet 550 mg (has no administration in time range)  insulin aspart (novoLOG) injection 0-9 Units (has no administration in time range)  pantoprazole (PROTONIX) injection 40 mg (has no administration in time range)    Followed by  pantoprazole (PROTONIX) injection 40 mg (has no administration in time range)  ondansetron (ZOFRAN) tablet 4 mg (has no administration in time range)    Or  ondansetron (ZOFRAN) injection 4 mg (has no administration in time range)  sodium bicarbonate tablet 650 mg (has no administration in time range)  morphine (PF) 2 MG/ML injection 2 mg (2 mg Intravenous Given 05/20/23 1550)  ondansetron (ZOFRAN) injection 4 mg (4 mg Intravenous Given 05/20/23 1550)  pantoprazole (PROTONIX) injection 80 mg (80 mg Intravenous Given 05/20/23 1550)  iohexol (OMNIPAQUE) 350 MG/ML injection 80 mL (80 mLs Intravenous Contrast Given 05/20/23 1715)  sodium chloride 0.9 % bolus 1,000 mL (0 mLs Intravenous Stopped 05/20/23 2040)  insulin aspart (novoLOG) injection 10  Units (10 Units Subcutaneous Given 05/20/23 1859)  morphine (PF) 4 MG/ML injection 4 mg (4 mg Intravenous Given 05/20/23 2040)  ondansetron (ZOFRAN) injection 4 mg (4 mg Intravenous Given 05/20/23 2040)    Mobility walks     Focused Assessments Ostomy with tenderness around the site.    R Recommendations: See Admitting Provider Note  Report given to:   Additional Notes: Pt A/Ox4, independent.

## 2023-05-20 NOTE — Assessment & Plan Note (Signed)
Chads Vasc of 4 but Has Bled score of > 3 = not candidate for [facility] Also having acute GIB today.

## 2023-05-20 NOTE — Assessment & Plan Note (Addendum)
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 NPO Protonix IV BID EDP reports they d/w GI who wanted pt admitted for H/H monitoring and will see in AM Will get ammonia level as a baseline Cont carafate Sending message to GI to ask about SBP PPX / Octreotide in pt with PHG / GAVE Per Dr. Dulce Sellar "If no ascites or scant ascites would not do antibiotics. Can use octreotide for GAVE bleeding. Our team will plan to see patient in morning" Ill order octreotide

## 2023-05-20 NOTE — Assessment & Plan Note (Signed)
CKD 3B with NAG acidosis Start PO bicarb for RTA with ongoing acidosis Creat today of 1.92 essentially unchanged from DC last week (1.99).

## 2023-05-20 NOTE — Assessment & Plan Note (Signed)
Hyperglycemic in 500s in ED today No AG. Got 10u novolog x1 in ED Putting pt on sensitive SSI Q4H while NPO for the moment

## 2023-05-20 NOTE — ED Triage Notes (Signed)
Pt BIB [facility] from home. Pt with colostomy with bloody output that started today. EMS states pt emptied bag with blood x4 today. EMS also reports pt is hyperglycemic 499. EMS admin 4mg  of Zofran IV.   EMS Vitals  138/83 HR 70 SpO2 99% on R/A.

## 2023-05-20 NOTE — ED Provider Notes (Signed)
[facility] AT [facility] Provider Note   CSN: [id number] Arrival date & time: 05/20/23  1459     History  No chief complaint on file.   Martin Anthony is a 57 y.o. male.  57 year old male with past medical history of end-stage liver disease with recent admission for duodenitis and bleeding from his ostomy presenting to the emergency department today with bleeding from his ostomy.  The patient states that this started back yesterday.  He states he is having some pain that is diffuse in character.  The patient states is similar to pain he was experiencing when he was admitted recently for this.  The patient did have an EGD performed a few days ago that did show worsening duodenitis.  The patient is not on any blood thinners.  He denies any fevers.  He has had some nausea but denies any vomiting.  He states that he has had dark blood with clots in his ostomy bag and has had to change this 5 times a day.        Home Medications Prior to Admission medications   Medication Sig Start Date End Date Taking? Authorizing Provider  folic acid (FOLVITE) 1 MG tablet Take 1 tablet (1 mg total) by mouth daily. 05/21/21   Leroy Sea, MD  furosemide (LASIX) 20 MG tablet Take 1 tablet (20 mg total) by mouth daily. 04/08/23   Hughie Closs, MD  gabapentin (NEURONTIN) 100 MG capsule Take 1 capsule (100 mg total) by mouth 3 (three) times daily. Patient taking differently: Take 100 mg by mouth 2 (two) times daily. 04/08/23 05/08/23  Hughie Closs, MD  gabapentin (NEURONTIN) 300 MG capsule Take 300 mg by mouth at bedtime.    [provider]  Iron, Ferrous Sulfate, 325 (65 Fe) MG TABS Take 325 mg by mouth daily. Patient taking differently: Take 325 mg by mouth daily with breakfast. 12/18/21   Nelwyn Salisbury, MD  midodrine (PROAMATINE) 5 MG tablet Take 1 tablet (5 mg total) by mouth 3 (three) times daily with meals. 05/12/23 06/11/23  Lanae Boast, MD  nadolol  (CORGARD) 20 MG tablet Take 1 tablet (20 mg total) by mouth daily. Patient not taking: Reported on 05/07/2023 12/05/21   Nelwyn Salisbury, MD  nicotine (NICODERM CQ - DOSED IN MG/24 HOURS) 14 mg/24hr patch Place 1 patch (14 mg total) onto the skin daily. Patient taking differently: Place 7 mg onto the skin daily. 04/08/23 04/07/24  Hughie Closs, MD  nystatin (MYCOSTATIN/NYSTOP) powder Apply 1 Application topically 3 (three) times daily. Patient taking differently: Apply 1 Application topically See admin instructions. Apply to affected area three times a day 05/03/23   Nelwyn Salisbury, MD  pantoprazole (PROTONIX) 40 MG tablet Take 1 tablet (40 mg total) by mouth 2 (two) times daily for 28 days. 05/12/23 06/09/23  Lanae Boast, MD  pantoprazole (PROTONIX) 40 MG tablet Take 1 tablet (40 mg total) by mouth daily. 06/11/23   Lanae Boast, MD  rifaximin (XIFAXAN) 550 MG TABS tablet Take 1 tablet (550 mg total) by mouth 2 (two) times daily. 01/20/22   Nelwyn Salisbury, MD  sertraline (ZOLOFT) 50 MG tablet Take 1 tablet (50 mg total) by mouth daily. 02/02/23   Nelwyn Salisbury, MD  spironolactone (ALDACTONE) 100 MG tablet Take 1 tablet (100 mg total) by mouth daily. 05/13/23 06/12/23  Lanae Boast, MD  sucralfate (CARAFATE) 1 g tablet Take 1 tablet (1 g total) by mouth 4 (four) times  daily -  with meals and at bedtime. 05/12/23 06/11/23  Lanae Boast, MD  thiamine 100 MG tablet Take 1 tablet (100 mg total) by mouth daily. 05/21/21   Leroy Sea, MD      Allergies    Acetaminophen and Ibuprofen    Review of Systems   Review of Systems  Gastrointestinal:  Positive for blood in stool.  All other systems reviewed and are negative.   Physical Exam Updated Vital Signs BP 134/79   Pulse 70   Temp 98.3 F (36.8 C) (Oral)   Resp 20   Ht 6\' 4"  (1.93 m)   Wt 79.4 kg   SpO2 100%   BMI 21.30 kg/m  Physical Exam Vitals and nursing note reviewed.   Gen: NAD Eyes: PERRL, EOMI HEENT: no oropharyngeal  swelling Neck: trachea midline Resp: clear to auscultation bilaterally Card: RRR, no murmurs, rubs, or gallops Abd: Mild diffuse tenderness, no guarding or rebound, there is some dark blood noted in his ostomy bag Extremities: no calf tenderness, no edema Vascular: 2+ radial pulses bilaterally, 2+ DP pulses bilaterally Skin: no rashes Psyc: acting appropriately   ED Results / Procedures / Treatments   Labs (all labs ordered are listed, but only abnormal results are displayed) Labs Reviewed  CBC WITH DIFFERENTIAL/PLATELET - Abnormal; Notable for the following components:      Result Value   RBC 2.59 (*)    Hemoglobin 8.3 (*)    HCT 25.7 (*)    Platelets 87 (*)    Lymphs Abs 0.5 (*)    All other components within normal limits  COMPREHENSIVE METABOLIC PANEL - Abnormal; Notable for the following components:   Sodium 128 (*)    Potassium 5.2 (*)    CO2 16 (*)    Glucose, Bld 502 (*)    BUN 50 (*)    Creatinine, Ser 1.92 (*)    Calcium 8.7 (*)    Albumin 2.3 (*)    Total Bilirubin 2.7 (*)    GFR, Estimated 40 (*)    All other components within normal limits  LIPASE, BLOOD - Abnormal; Notable for the following components:   Lipase 438 (*)    All other components within normal limits  PROTIME-INR - Abnormal; Notable for the following components:   Prothrombin Time 20.4 (*)    INR 1.7 (*)    All other components within normal limits  URINALYSIS, ROUTINE W REFLEX MICROSCOPIC - Abnormal; Notable for the following components:   Specific Gravity, Urine 1.035 (*)    Glucose, UA >=500 (*)    All other components within normal limits  BLOOD GAS, VENOUS - Abnormal; Notable for the following components:   pCO2, Ven 32 (*)    Bicarbonate 15.7 (*)    Acid-base deficit 9.6 (*)    All other components within normal limits  CBG MONITORING, ED - Abnormal; Notable for the following components:   Glucose-Capillary 420 (*)    All other components within normal limits  I-STAT VENOUS BLOOD  GAS, ED  TYPE AND SCREEN    EKG None  Radiology CT Angio Abd/Pel W and/or Wo Contrast  Result Date: 05/20/2023 CLINICAL DATA:  Lower GI bleed. EXAM: CTA ABDOMEN AND PELVIS WITHOUT AND WITH CONTRAST TECHNIQUE: Multidetector CT imaging of the abdomen and pelvis was performed using the standard protocol during bolus administration of intravenous contrast. Multiplanar reconstructed images and MIPs were obtained and reviewed to evaluate the vascular anatomy. RADIATION DOSE REDUCTION: This exam was performed according to  the departmental dose-optimization program which includes automated exposure control, adjustment of the mA and/or kV according to patient size and/or use of iterative reconstruction technique. CONTRAST:  80mL OMNIPAQUE IOHEXOL 350 MG/ML SOLN COMPARISON:  05/08/2023 FINDINGS: VASCULAR Aorta: Aortic atherosclerotic calcifications. Normal caliber aorta without aneurysm, dissection, vasculitis or significant stenosis. Celiac: Patent without evidence of aneurysm, dissection, vasculitis or significant stenosis. SMA: Patent without evidence of aneurysm, dissection, vasculitis or significant stenosis. Renals: Both renal arteries are patent without evidence of aneurysm, dissection, vasculitis, fibromuscular dysplasia or significant stenosis. IMA: Patent without evidence of aneurysm, dissection, vasculitis or significant stenosis. Inflow: Patent without evidence of aneurysm, dissection, vasculitis or significant stenosis. Proximal Outflow: Bilateral common femoral and visualized portions of the superficial and profunda femoral arteries are patent without evidence of aneurysm, dissection, vasculitis or significant stenosis. Veins: Patent. Left abdominal Porto systemic varix is identified, image 49/19. Distal esophageal varices. Review of the MIP images confirms the above findings. NON-VASCULAR Lower chest: No acute abnormality. Hepatobiliary: The uterus appears atrophic with diffuse Leigh nodular contour  compatible with advanced cirrhosis. Too small to characterize low-density structure within segment 4 measures 6 mm, image 13/19. No enhancing liver lesions identified. There is mild diffuse bladder wall thickening which measures up to 5 mm, nonspecific in the setting of advanced cirrhosis and portal venous hypertension. No gallstones identified. No signs of bile duct dilatation. Pancreas: Unremarkable. No pancreatic ductal dilatation or surrounding inflammatory changes. Spleen: Splenomegaly. Spleen measures 15.2 cm in cranial caudal dimension. Adrenals/Urinary Tract: Normal appearance of the adrenal glands. Nonobstructing stone within the inferior pole of the right kidney measures 5 mm. Several left renal calculi are also noted which measure up to 3 mm. No signs of kidney mass or obstructive uropathy. Urinary bladder appears normal. Stomach/Bowel: Stomach appears nondistended. Mild wall thickening with surrounding soft tissue stranding is noted about the descending portion of the duodenum. Right-sided paramidline ileostomy the is identified. No pathologic dilatation of the large or small bowel loops. postoperative changes from right hemicolectomy. Long segment of Hartmann's pouch is identified which terminates in the right upper quadrant of the abdomen.No signs of active intraluminal contrast extravasation within the large or small bowel loops to indicate site of suspected GI bleed. Lymphatic: No significant vascular findings are present. No enlarged abdominal or pelvic lymph nodes. Reproductive: Prostate is unremarkable. Other: Trace free fluid noted within the pelvis, image 75/19. No focal fluid collections identified to suggest abscess. Musculoskeletal: No acute or significant osseous findings. IMPRESSION: 1. No signs of active intraluminal contrast extravasation within the large or small bowel loops to indicate site of suspected GI bleed. 2. Advanced cirrhosis with signs of portal venous hypertension including  splenomegaly, left abdominal Porto systemic varix and distal esophageal varices. 3. Wall thickening with surrounding soft tissue stranding about the descending portion of the duodenum. Findings are nonspecific in the setting of advanced cirrhosis and portal venous hypertension. Correlate for any clinical signs or symptoms of duodenitis 4. Nonobstructing bilateral renal calculi. 5. Aortic Atherosclerosis (ICD10-I70.0). Electronically Signed   By: Signa Kell M.D.   On: 05/20/2023 19:17    Procedures Procedures    Medications Ordered in ED Medications  morphine (PF) 2 MG/ML injection 2 mg (2 mg Intravenous Given 05/20/23 1550)  ondansetron (ZOFRAN) injection 4 mg (4 mg Intravenous Given 05/20/23 1550)  pantoprazole (PROTONIX) injection 80 mg (80 mg Intravenous Given 05/20/23 1550)  iohexol (OMNIPAQUE) 350 MG/ML injection 80 mL (80 mLs Intravenous Contrast Given 05/20/23 1715)  sodium chloride 0.9 % bolus 1,000  mL (0 mLs Intravenous Stopped 05/20/23 2040)  insulin aspart (novoLOG) injection 10 Units (10 Units Subcutaneous Given 05/20/23 1859)  morphine (PF) 4 MG/ML injection 4 mg (4 mg Intravenous Given 05/20/23 2040)  ondansetron (ZOFRAN) injection 4 mg (4 mg Intravenous Given 05/20/23 2040)    ED Course/ Medical Decision Making/ A&P                                 Medical Decision Making 57 year old male with past medical history of 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  The patient does report that this has been worse over the past few days.  I will further evaluate the patient here with basic labs as well as a type and screen in the event that he does require transfusion.  I will give the patient morphine and Zofran for symptoms.  His initial vital signs are reassuring.  I will give the patient Protonix for this.  Will discuss his case  with gastroenterology.  Will hold off on imaging at this time until discussion with gastroenterology.  Will reevaluate for ultimate disposition.  The patient's blood glucose did come back greater than 500.  His bicarbonate is 16 but he does not have an anion gap.  I will give the patient subcutaneous insulin as well as IV fluids.  Venous blood gases ordered.  With no anion gap suspicion for DKA is relatively at this time.  The patient CT scan did not show any active bleeding.  I did call and discussed this with GI who recommended this study.  They recommended observation admission for evaluation of the patient's hemoglobin.  Calls placed hospital service for admission.  Amount and/or Complexity of Data Reviewed Labs: ordered. Radiology: ordered.  Risk Prescription drug management. Decision regarding hospitalization.           Final Clinical Impression(s) / ED Diagnoses Final diagnoses:  Gastrointestinal hemorrhage, unspecified gastrointestinal hemorrhage type    Rx / DC Orders ED Discharge Orders     None         Durwin Glaze, MD 05/20/23 2043

## 2023-05-20 NOTE — Assessment & Plan Note (Addendum)
ABLA due to GIB on top of iron def anemia. Repeat H/H at [facility] and then CBC in AM Transfuse if HGB < 7

## 2023-05-20 NOTE — H&P (Signed)
History and Physical    Patient: Martin Anthony EGB:[id number] DOB: 1966/06/07 DOA: 05/20/2023 DOS: the patient was seen and examined on 05/20/2023 PCP: Nelwyn Salisbury, MD  Patient coming from: Home  Chief Complaint: No chief complaint on file.  HPI: Martin Anthony is a 57 y.o. male with medical history significant of ESLD, A.Fib not on AC, CKD 3, prior EtOH abuse resulting in cirrhosis and ESLD.  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  EGD did also reveal a small esophageal varix as well but this wasn't bleeding and had no stigmata of recent bleed.  Last EtOH = Sept 2024.  Sober for 3 months now.  Pt in to ED today with recurrent diffuse abd pain, and recurrent dark blood with clots in ostomy bag 5 times today.  Symptoms are similar / the same as last week when he was admitted.   Review of Systems: As mentioned in the history of present illness. All other systems reviewed and are negative. Past Medical History:  Diagnosis Date   Anxiety    Ascites due to alcoholic cirrhosis (HCC)    Cirrhosis with alcoholism (HCC)    Depression    Diabetes mellitus without complication (HCC)    GERD (gastroesophageal reflux disease)    Gout    Hyperlipidemia    Hypertension    Pancreatitis    Primary adenocarcinoma of ascending colon (HCC)    Substance abuse (HCC)    alcoholism    Type 2 diabetes mellitus with diabetic neuropathy [facility])    Past Surgical History:  Procedure Laterality Date   BIOPSY  01/19/2019   Procedure: BIOPSY;  Surgeon: Jeani Hawking, MD;  Location: [facility];  Service: Endoscopy;;   BIOPSY  05/09/2023   Procedure: BIOPSY;  Surgeon: Kathi Der, MD;  Location: [facility];  Service: Gastroenterology;;   COLON SURGERY  10/25/2021   right hemicolectomy with ostomy at [facility] in [place]   COLONOSCOPY N/A 01/19/2019   Procedure: COLONOSCOPY;  Surgeon: Jeani Hawking, MD;  Location: [facility];  Service: Endoscopy;  Laterality: N/A;   ESOPHAGEAL BANDING  03/25/2023   Procedure: ESOPHAGEAL BANDING;  Surgeon: Kerin Salen, MD;  Location: [facility];  Service: Gastroenterology;;   ESOPHAGOGASTRODUODENOSCOPY (EGD) WITH PROPOFOL N/A 01/19/2019   Procedure: ESOPHAGOGASTRODUODENOSCOPY (EGD) WITH PROPOFOL;  Surgeon: Jeani Hawking, MD;  Location: [facility];  Service: Endoscopy;  Laterality: N/A;   ESOPHAGOGASTRODUODENOSCOPY (EGD) WITH PROPOFOL N/A 03/25/2023   Procedure: ESOPHAGOGASTRODUODENOSCOPY (EGD) WITH PROPOFOL;  Surgeon: Kerin Salen, MD;  Location: [facility];  Service: Gastroenterology;  Laterality: N/A;   ESOPHAGOGASTRODUODENOSCOPY (EGD) WITH PROPOFOL N/A 05/09/2023   Procedure: ESOPHAGOGASTRODUODENOSCOPY (EGD) WITH PROPOFOL;  Surgeon: Kathi Der, MD;  Location: [facility];  Service: Gastroenterology;  Laterality: N/A;   HEMOSTASIS CLIP PLACEMENT  01/19/2019   Procedure: HEMOSTASIS CLIP PLACEMENT;  Surgeon: Jeani Hawking, MD;  Location: [facility];  Service: Endoscopy;;   HERNIA REPAIR     umbilical    ILEOSCOPY N/A 03/25/2023   Procedure: ILEOSCOPY THROUGH STOMA;  Surgeon: Kerin Salen, MD;  Location: [facility];  Service: Gastroenterology;  Laterality: N/A;   INGUINAL HERNIA REPAIR Bilateral    IR FLUORO GUIDE CV LINE RIGHT  05/13/2021   IR PARACENTESIS  05/19/2021   IR US GUIDE VASC ACCESS RIGHT  05/13/2021   POLYPECTOMY  01/19/2019   Procedure: POLYPECTOMY;  Surgeon: Jeani Hawking, MD;  Location: [facility];  Service: Endoscopy;;   Social History:  reports  that he quit smoking about 21 years ago. His smoking use included cigarettes. He has never used smokeless tobacco. He reports current alcohol use of about 2.0 standard drinks of alcohol per week. He reports that he does not use drugs.  Allergies  Allergen Reactions   Acetaminophen Other (See Comments)    States he was told not to take it- Contraindicated for condition regarding the liver    Ibuprofen Other (See Comments)    Liver sensitivity- Contraindicated for condition regarding the liver  MD told the patient he may take this in very low doses in 04/2023    Family History  Problem Relation Age of Onset   Hypertension Mother    Diabetes Father    Heart attack Father    Pneumonia Father    Hypertension Father    Diabetes Maternal Grandfather     Prior to Admission medications   Medication Sig Start Date End Date Taking? Authorizing Provider  folic acid (FOLVITE) 1 MG tablet Take 1 tablet (1 mg total) by mouth daily. 05/21/21   Leroy Sea, MD  furosemide (LASIX) 20 MG tablet Take 1 tablet (20 mg total) by mouth daily. 04/08/23   Hughie Closs, MD  gabapentin (NEURONTIN) 100 MG capsule Take 1 capsule (100 mg total) by mouth 3 (three) times daily. Patient taking differently: Take 100 mg by mouth 2 (two) times daily. 04/08/23 05/08/23  Hughie Closs, MD  gabapentin (NEURONTIN) 300 MG capsule Take 300 mg by mouth at bedtime.    [provider]  Iron, Ferrous Sulfate, 325 (65 Fe) MG TABS Take 325 mg by mouth daily. Patient taking differently: Take 325 mg by mouth daily with breakfast. 12/18/21   Nelwyn Salisbury, MD  midodrine (PROAMATINE) 5 MG tablet Take 1 tablet (5 mg total) by mouth 3 (three) times daily with meals. 05/12/23 06/11/23  Lanae Boast, MD  nadolol (CORGARD) 20 MG tablet Take 1 tablet (20 mg total) by mouth daily. Patient not taking: Reported on 05/07/2023 12/05/21   Nelwyn Salisbury, MD  nicotine (NICODERM CQ - DOSED IN MG/24 HOURS) 14 mg/24hr patch Place 1 patch (14 mg total) onto the skin daily. Patient taking differently: Place 7 mg onto the skin daily. 04/08/23 04/07/24  Hughie Closs, MD  nystatin (MYCOSTATIN/NYSTOP) powder Apply 1 Application topically 3 (three) times daily. Patient taking differently: Apply 1 Application topically See admin instructions. Apply to affected area three times a day 05/03/23   Nelwyn Salisbury, MD  pantoprazole  (PROTONIX) 40 MG tablet Take 1 tablet (40 mg total) by mouth 2 (two) times daily for 28 days. 05/12/23 06/09/23  Lanae Boast, MD  pantoprazole (PROTONIX) 40 MG tablet Take 1 tablet (40 mg total) by mouth daily. 06/11/23   Lanae Boast, MD  rifaximin (XIFAXAN) 550 MG TABS tablet Take 1 tablet (550 mg total) by mouth 2 (two) times daily. 01/20/22   Nelwyn Salisbury, MD  sertraline (ZOLOFT) 50 MG tablet Take 1 tablet (50 mg total) by mouth daily. 02/02/23   Nelwyn Salisbury, MD  spironolactone (ALDACTONE) 100 MG tablet Take 1 tablet (100 mg total) by mouth daily. 05/13/23 06/12/23  Lanae Boast, MD  sucralfate (CARAFATE) 1 g tablet Take 1 tablet (1 g total) by mouth 4 (four) times daily -  with meals and at bedtime. 05/12/23 06/11/23  Lanae Boast, MD  thiamine 100 MG tablet Take 1 tablet (100 mg total) by mouth daily. 05/21/21   Leroy Sea, MD    Physical Exam: Vitals:  05/20/23 1519 05/20/23 1600 05/20/23 1900 05/20/23 1915  BP: (!) 141/84 (!) 142/70 134/79   Pulse: 74 60 70   Resp: 20 20 20    Temp: 98.5 F (36.9 C)   98.3 F (36.8 C)  TempSrc: Oral   Oral  SpO2: 100% 100% 100%   Weight:      Height:       Constitutional: NAD, calm, comfortable Respiratory: clear to auscultation bilaterally, no wheezing, no crackles. Normal respiratory effort. No accessory muscle use.  Cardiovascular: Regular rate and rhythm, no murmurs / rubs / gallops. No extremity edema. 2+ pedal pulses. No carotid bruits.  Abdomen: Mild diffuse TTP, some dark blood in ostomy bag Neurologic: CN 2-12 grossly intact. Sensation intact, DTR normal. Strength 5/5 in all 4.  Psychiatric: Normal judgment and insight. Alert and oriented x 3. Normal mood.   Data Reviewed:    Labs on Admission: I have personally reviewed following labs and imaging studies  CBC: Recent Labs  Lab 05/20/23 1534  WBC 4.0  NEUTROABS 3.0  HGB 8.3*  HCT 25.7*  MCV 99.2  PLT 87*   Basic Metabolic Panel: Recent Labs  Lab 05/20/23 1534  NA  128*  K 5.2*  CL 105  CO2 16*  GLUCOSE 502*  BUN 50*  CREATININE 1.92*  CALCIUM 8.7*   GFR: Estimated Creatinine Clearance: 48.2 mL/min (A) (by C-G formula based on SCr of 1.92 mg/dL (H)). Liver Function Tests: Recent Labs  Lab 05/20/23 1534  AST 40  ALT 18  ALKPHOS 113  BILITOT 2.7*  PROT 6.6  ALBUMIN 2.3*   Recent Labs  Lab 05/20/23 1534  LIPASE 438*   No results for input(s): "AMMONIA" in the last 168 hours. Coagulation Profile: Recent Labs  Lab 05/20/23 1534  INR 1.7*   Cardiac Enzymes: No results for input(s): "CKTOTAL", "CKMB", "CKMBINDEX", "TROPONINI" in the last 168 hours. BNP (last 3 results) No results for input(s): "PROBNP" in the last 8760 hours. HbA1C: No results for input(s): "HGBA1C" in the last 72 hours. CBG: Recent Labs  Lab 05/20/23 2017  GLUCAP 420*   Lipid Profile: No results for input(s): "CHOL", "HDL", "LDLCALC", "TRIG", "CHOLHDL", "LDLDIRECT" in the last 72 hours. Thyroid Function Tests: No results for input(s): "TSH", "T4TOTAL", "FREET4", "T3FREE", "THYROIDAB" in the last 72 hours. Anemia Panel: No results for input(s): "VITAMINB12", "FOLATE", "FERRITIN", "TIBC", "IRON", "RETICCTPCT" in the last 72 hours. Urine analysis:    Component Value Date/Time   COLORURINE YELLOW 05/20/2023 2022   APPEARANCEUR CLEAR 05/20/2023 2022   LABSPEC 1.035 (H) 05/20/2023 2022   PHURINE 5.0 05/20/2023 2022   GLUCOSEU >=500 (A) 05/20/2023 2022   HGBUR NEGATIVE 05/20/2023 2022   BILIRUBINUR NEGATIVE 05/20/2023 2022   BILIRUBINUR 2+ 05/17/2015 1013   KETONESUR NEGATIVE 05/20/2023 2022   PROTEINUR NEGATIVE 05/20/2023 2022   UROBILINOGEN 0.2 05/17/2015 1013   NITRITE NEGATIVE 05/20/2023 2022   LEUKOCYTESUR NEGATIVE 05/20/2023 2022    Radiological Exams on Admission: CT Angio Abd/Pel W and/or Wo Contrast  Result Date: 05/20/2023 CLINICAL DATA:  Lower GI bleed. EXAM: CTA ABDOMEN AND PELVIS WITHOUT AND WITH CONTRAST TECHNIQUE: Multidetector CT  imaging of the abdomen and pelvis was performed using the standard protocol during bolus administration of intravenous contrast. Multiplanar reconstructed images and MIPs were obtained and reviewed to evaluate the vascular anatomy. RADIATION DOSE REDUCTION: This exam was performed according to the departmental dose-optimization program which includes automated exposure control, adjustment of the mA and/or kV according to patient size and/or use of iterative  reconstruction technique. CONTRAST:  80mL OMNIPAQUE IOHEXOL 350 MG/ML SOLN COMPARISON:  05/08/2023 FINDINGS: VASCULAR Aorta: Aortic atherosclerotic calcifications. Normal caliber aorta without aneurysm, dissection, vasculitis or significant stenosis. Celiac: Patent without evidence of aneurysm, dissection, vasculitis or significant stenosis. SMA: Patent without evidence of aneurysm, dissection, vasculitis or significant stenosis. Renals: Both renal arteries are patent without evidence of aneurysm, dissection, vasculitis, fibromuscular dysplasia or significant stenosis. IMA: Patent without evidence of aneurysm, dissection, vasculitis or significant stenosis. Inflow: Patent without evidence of aneurysm, dissection, vasculitis or significant stenosis. Proximal Outflow: Bilateral common femoral and visualized portions of the superficial and profunda femoral arteries are patent without evidence of aneurysm, dissection, vasculitis or significant stenosis. Veins: Patent. Left abdominal Porto systemic varix is identified, image 49/19. Distal esophageal varices. Review of the MIP images confirms the above findings. NON-VASCULAR Lower chest: No acute abnormality. Hepatobiliary: The uterus appears atrophic with diffuse Leigh nodular contour compatible with advanced cirrhosis. Too small to characterize low-density structure within segment 4 measures 6 mm, image 13/19. No enhancing liver lesions identified. There is mild diffuse bladder wall thickening which measures up to 5  mm, nonspecific in the setting of advanced cirrhosis and portal venous hypertension. No gallstones identified. No signs of bile duct dilatation. Pancreas: Unremarkable. No pancreatic ductal dilatation or surrounding inflammatory changes. Spleen: Splenomegaly. Spleen measures 15.2 cm in cranial caudal dimension. Adrenals/Urinary Tract: Normal appearance of the adrenal glands. Nonobstructing stone within the inferior pole of the right kidney measures 5 mm. Several left renal calculi are also noted which measure up to 3 mm. No signs of kidney mass or obstructive uropathy. Urinary bladder appears normal. Stomach/Bowel: Stomach appears nondistended. Mild wall thickening with surrounding soft tissue stranding is noted about the descending portion of the duodenum. Right-sided paramidline ileostomy the is identified. No pathologic dilatation of the large or small bowel loops. postoperative changes from right hemicolectomy. Long segment of Hartmann's pouch is identified which terminates in the right upper quadrant of the abdomen.No signs of active intraluminal contrast extravasation within the large or small bowel loops to indicate site of suspected GI bleed. Lymphatic: No significant vascular findings are present. No enlarged abdominal or pelvic lymph nodes. Reproductive: Prostate is unremarkable. Other: Trace free fluid noted within the pelvis, image 75/19. No focal fluid collections identified to suggest abscess. Musculoskeletal: No acute or significant osseous findings. IMPRESSION: 1. No signs of active intraluminal contrast extravasation within the large or small bowel loops to indicate site of suspected GI bleed. 2. Advanced cirrhosis with signs of portal venous hypertension including splenomegaly, left abdominal Porto systemic varix and distal esophageal varices. 3. Wall thickening with surrounding soft tissue stranding about the descending portion of the duodenum. Findings are nonspecific in the setting of advanced  cirrhosis and portal venous hypertension. Correlate for any clinical signs or symptoms of duodenitis 4. Nonobstructing bilateral renal calculi. 5. Aortic Atherosclerosis (ICD10-I70.0). Electronically Signed   By: Signa Kell M.D.   On: 05/20/2023 19:17    EKG: Independently reviewed.   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 NPO Protonix IV BID EDP reports they d/w GI who wanted pt admitted for H/H monitoring and will see in AM Will get ammonia level as a baseline Cont carafate Sending message to GI to ask about SBP PPX / Octreotide in  pt with PHG / GAVE Per Dr. Dulce Sellar "If no ascites or scant ascites would not do antibiotics. Can use octreotide for GAVE bleeding. Our team will plan to see patient in morning" Ill order octreotide  Anemia ABLA due to GIB on top of iron def anemia. Repeat H/H at [facility] and then CBC in AM Transfuse if HGB < 7  Type 2 diabetes mellitus with neurological complications (HCC) Hyperglycemic in 500s in ED today No AG. Got 10u novolog x1 in ED Putting pt on sensitive SSI Q4H while NPO for the moment  Alcoholic cirrhosis of liver with ascites (HCC) Pt with ESLD, last drink in Sept it seems. How long does he need to be abstinent to be transplant candidate? (Will ask GI) Hold diuretics overnight in setting of acute GIB Pt unable to obtain nadolol so kept taking coreg after discharge on 11/27: Will switch to nadolol  Essential hypertension Looks like pt discharged on midodrine after last hospital stay (HYPOtensive presumably if so). Will see if he's still taking this med and continue if so.  PAF (paroxysmal atrial fibrillation) (HCC) Chads Vasc of 4 but Has Bled score of > 3 = not candidate for [facility] Also having acute GIB today.  CKD (chronic  kidney disease) stage 3, GFR 30-59 ml/min (HCC) CKD 3B with NAG acidosis Start PO bicarb for RTA with ongoing acidosis Creat today of 1.92 essentially unchanged from DC last week (1.99).      Advance Care Planning:   Code Status: Full Code  Consults: EDP discussed with GI, I also sent a secure chat to [facility] (Dr. Dulce Sellar / Dr. Levora Angel)  Family Communication: No family at bedside  Severity of Illness: The appropriate patient status for this patient is OBSERVATION. Observation status is judged to be reasonable and necessary in order to provide the required intensity of service to ensure the patient's safety. The patient's presenting symptoms, physical exam findings, and initial radiographic and laboratory data in the context of their medical condition is felt to place them at decreased risk for further clinical deterioration. Furthermore, it is anticipated that the patient will be medically stable for discharge from the hospital within 2 midnights of admission.   Author: Hillary Bow., DO 05/20/2023 9:22 PM  For on call review [email or web address].

## 2023-05-20 NOTE — Assessment & Plan Note (Addendum)
Pt with ESLD, last drink in Sept it seems. How long does he need to be abstinent to be transplant candidate? (Will ask GI) Hold diuretics overnight in setting of acute GIB Pt unable to obtain nadolol so kept taking coreg after discharge on 11/27: Will switch to nadolol

## 2023-05-20 NOTE — Assessment & Plan Note (Signed)
Looks like pt discharged on midodrine after last hospital stay (HYPOtensive presumably if so). Will see if he's still taking this med and continue if so.

## 2023-05-21 DIAGNOSIS — K7210 Chronic hepatic failure without coma: Secondary | ICD-10-CM | POA: Diagnosis present

## 2023-05-21 DIAGNOSIS — E1149 Type 2 diabetes mellitus with other diabetic neurological complication: Secondary | ICD-10-CM | POA: Diagnosis present

## 2023-05-21 DIAGNOSIS — C189 Malignant neoplasm of colon, unspecified: Secondary | ICD-10-CM | POA: Diagnosis present

## 2023-05-21 DIAGNOSIS — E875 Hyperkalemia: Secondary | ICD-10-CM | POA: Diagnosis present

## 2023-05-21 DIAGNOSIS — D61818 Other pancytopenia: Secondary | ICD-10-CM | POA: Diagnosis not present

## 2023-05-21 DIAGNOSIS — K922 Gastrointestinal hemorrhage, unspecified: Secondary | ICD-10-CM | POA: Diagnosis present

## 2023-05-21 DIAGNOSIS — E1165 Type 2 diabetes mellitus with hyperglycemia: Secondary | ICD-10-CM | POA: Diagnosis present

## 2023-05-21 DIAGNOSIS — D509 Iron deficiency anemia, unspecified: Secondary | ICD-10-CM | POA: Diagnosis present

## 2023-05-21 DIAGNOSIS — K859 Acute pancreatitis without necrosis or infection, unspecified: Secondary | ICD-10-CM | POA: Diagnosis present

## 2023-05-21 DIAGNOSIS — K767 Hepatorenal syndrome: Secondary | ICD-10-CM | POA: Diagnosis present

## 2023-05-21 DIAGNOSIS — K766 Portal hypertension: Secondary | ICD-10-CM | POA: Diagnosis present

## 2023-05-21 DIAGNOSIS — Z933 Colostomy status: Secondary | ICD-10-CM | POA: Diagnosis not present

## 2023-05-21 DIAGNOSIS — K219 Gastro-esophageal reflux disease without esophagitis: Secondary | ICD-10-CM | POA: Diagnosis present

## 2023-05-21 DIAGNOSIS — E871 Hypo-osmolality and hyponatremia: Secondary | ICD-10-CM | POA: Diagnosis not present

## 2023-05-21 DIAGNOSIS — I480 Paroxysmal atrial fibrillation: Secondary | ICD-10-CM | POA: Diagnosis present

## 2023-05-21 DIAGNOSIS — E785 Hyperlipidemia, unspecified: Secondary | ICD-10-CM | POA: Diagnosis present

## 2023-05-21 DIAGNOSIS — K7031 Alcoholic cirrhosis of liver with ascites: Secondary | ICD-10-CM | POA: Diagnosis present

## 2023-05-21 DIAGNOSIS — E872 Acidosis, unspecified: Secondary | ICD-10-CM | POA: Diagnosis not present

## 2023-05-21 DIAGNOSIS — I959 Hypotension, unspecified: Secondary | ICD-10-CM | POA: Diagnosis present

## 2023-05-21 DIAGNOSIS — N179 Acute kidney failure, unspecified: Secondary | ICD-10-CM | POA: Diagnosis not present

## 2023-05-21 DIAGNOSIS — D62 Acute posthemorrhagic anemia: Secondary | ICD-10-CM | POA: Diagnosis present

## 2023-05-21 DIAGNOSIS — K31811 Angiodysplasia of stomach and duodenum with bleeding: Secondary | ICD-10-CM | POA: Diagnosis present

## 2023-05-21 DIAGNOSIS — N1832 Chronic kidney disease, stage 3b: Secondary | ICD-10-CM | POA: Diagnosis present

## 2023-05-21 DIAGNOSIS — E1122 Type 2 diabetes mellitus with diabetic chronic kidney disease: Secondary | ICD-10-CM | POA: Diagnosis present

## 2023-05-21 DIAGNOSIS — K2981 Duodenitis with bleeding: Secondary | ICD-10-CM | POA: Diagnosis present

## 2023-05-21 LAB — AMMONIA: Ammonia: 34 umol/L (ref 9–35)

## 2023-05-21 LAB — CBC
HCT: 22.5 % — ABNORMAL LOW (ref 39.0–52.0)
Hemoglobin: 7.4 g/dL — ABNORMAL LOW (ref 13.0–17.0)
MCH: 32.2 pg (ref 26.0–34.0)
MCHC: 32.9 g/dL (ref 30.0–36.0)
MCV: 97.8 fL (ref 80.0–100.0)
Platelets: 91 10*3/uL — ABNORMAL LOW (ref 150–400)
RBC: 2.3 MIL/uL — ABNORMAL LOW (ref 4.22–5.81)
RDW: 14.9 % (ref 11.5–15.5)
WBC: 6.2 10*3/uL (ref 4.0–10.5)
nRBC: 0 % (ref 0.0–0.2)

## 2023-05-21 LAB — HEMOGLOBIN A1C
Hgb A1c MFr Bld: 6.2 % — ABNORMAL HIGH (ref 4.8–5.6)
Mean Plasma Glucose: 131.24 mg/dL

## 2023-05-21 LAB — COMPREHENSIVE METABOLIC PANEL
ALT: 18 U/L (ref 0–44)
AST: 38 U/L (ref 15–41)
Albumin: 2.5 g/dL — ABNORMAL LOW (ref 3.5–5.0)
Alkaline Phosphatase: 100 U/L (ref 38–126)
Anion gap: 6 (ref 5–15)
BUN: 51 mg/dL — ABNORMAL HIGH (ref 6–20)
CO2: 18 mmol/L — ABNORMAL LOW (ref 22–32)
Calcium: 8.8 mg/dL — ABNORMAL LOW (ref 8.9–10.3)
Chloride: 110 mmol/L (ref 98–111)
Creatinine, Ser: 2.32 mg/dL — ABNORMAL HIGH (ref 0.61–1.24)
GFR, Estimated: 32 mL/min — ABNORMAL LOW (ref >60)
Glucose, Bld: 155 mg/dL — ABNORMAL HIGH (ref 70–99)
Potassium: 5.4 mmol/L — ABNORMAL HIGH (ref 3.5–5.1)
Sodium: 134 mmol/L — ABNORMAL LOW (ref 135–145)
Total Bilirubin: 3 mg/dL — ABNORMAL HIGH (ref <1.2)
Total Protein: 6.7 g/dL (ref 6.5–8.1)

## 2023-05-21 LAB — GLUCOSE, CAPILLARY
Glucose-Capillary: 114 mg/dL — ABNORMAL HIGH (ref 70–99)
Glucose-Capillary: 139 mg/dL — ABNORMAL HIGH (ref 70–99)
Glucose-Capillary: 145 mg/dL — ABNORMAL HIGH (ref 70–99)
Glucose-Capillary: 152 mg/dL — ABNORMAL HIGH (ref 70–99)
Glucose-Capillary: 193 mg/dL — ABNORMAL HIGH (ref 70–99)
Glucose-Capillary: 211 mg/dL — ABNORMAL HIGH (ref 70–99)
Glucose-Capillary: 294 mg/dL — ABNORMAL HIGH (ref 70–99)

## 2023-05-21 MED ORDER — SODIUM ZIRCONIUM CYCLOSILICATE 10 G PO PACK
10.0000 g | PACK | Freq: Every day | ORAL | Status: DC
  Administered 2023-05-21 – 2023-05-22 (×2): 10 g via ORAL
  Filled 2023-05-21 (×2): qty 1

## 2023-05-21 NOTE — Progress Notes (Signed)
Mobility Specialist - Progress Note   05/21/23 1548  Mobility  Activity Ambulated independently in hallway  Level of Assistance Standby assist, set-up cues, supervision of patient - no hands on  Assistive Device Other (Comment) (IV Pole)  Distance Ambulated (ft) 400 ft  Range of Motion/Exercises Active  Activity Response Tolerated well  Mobility Referral Yes  Mobility visit 1 Mobility  Mobility Specialist Start Time (ACUTE ONLY) 1535  Mobility Specialist Stop Time (ACUTE ONLY) 1548  Mobility Specialist Time Calculation (min) (ACUTE ONLY) 13 min   Pt was found in bed and agreeable to ambulate after bathroom use. Pt was a little unsteady going from STS but able to gain his balance after a couple seconds. Pt had no complaints during session. At EOS returned to bed with all needs met. Call bell in reach.  Billey Chang Mobility Specialist

## 2023-05-21 NOTE — Consult Note (Signed)
[facility] Consultation Note  Referring Provider: Hospitalists Primary Care Physician:  Nelwyn Salisbury, MD Primary Gastroenterologist:  Vivia Budge, [place] [facility]  Reason for Consultation:  Blood per ostomy  HPI: [facility] is a 57 y.o. 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  Recurrent blood per ostomy.  Extensive prior imaging and endoscopic evaluation.  No further blood in ostomy since admission.  Some mucous but no blood per rectum.  No hematemesis.   Past Medical History:  Diagnosis Date   Anxiety    Ascites due to alcoholic cirrhosis (HCC)    Cirrhosis with alcoholism (HCC)    Depression    Diabetes mellitus without complication (HCC)    GERD (gastroesophageal reflux disease)    Gout    Hyperlipidemia    Hypertension    Pancreatitis    Primary adenocarcinoma of ascending colon (HCC)    Substance abuse (HCC)    alcoholism    Type 2 diabetes mellitus with diabetic neuropathy [facility])     Past Surgical History:  Procedure Laterality Date   BIOPSY  01/19/2019   Procedure: BIOPSY;  Surgeon: Jeani Hawking, MD;  Location: [facility];  Service: Endoscopy;;   BIOPSY  05/09/2023   Procedure: BIOPSY;  Surgeon: Kathi Der, MD;  Location: [facility];  Service: Gastroenterology;;   COLON SURGERY  10/25/2021   right hemicolectomy with ostomy at [facility] in [place]   COLONOSCOPY N/A 01/19/2019   Procedure: COLONOSCOPY;  Surgeon: Jeani Hawking, MD;  Location: [facility];  Service: Endoscopy;  Laterality: N/A;   ESOPHAGEAL BANDING  03/25/2023   Procedure: ESOPHAGEAL BANDING;  Surgeon: Kerin Salen, MD;  Location: [facility];  Service: Gastroenterology;;   ESOPHAGOGASTRODUODENOSCOPY (EGD) WITH PROPOFOL N/A 01/19/2019   Procedure: ESOPHAGOGASTRODUODENOSCOPY (EGD) WITH PROPOFOL;  Surgeon: Jeani Hawking, MD;  [facility];  Service: Endoscopy;  Laterality: N/A;    ESOPHAGOGASTRODUODENOSCOPY (EGD) WITH PROPOFOL N/A 03/25/2023   Procedure: ESOPHAGOGASTRODUODENOSCOPY (EGD) WITH PROPOFOL;  Surgeon: Kerin Salen, MD;  [facility];  Service: Gastroenterology;  Laterality: N/A;   ESOPHAGOGASTRODUODENOSCOPY (EGD) WITH PROPOFOL N/A 05/09/2023   Procedure: ESOPHAGOGASTRODUODENOSCOPY (EGD) WITH PROPOFOL;  Surgeon: Kathi Der, MD;  [facility];  Service: Gastroenterology;  Laterality: N/A;   HEMOSTASIS CLIP PLACEMENT  01/19/2019   Procedure: HEMOSTASIS CLIP PLACEMENT;  Surgeon: Jeani Hawking, MD;  [facility];  Service: Endoscopy;;   HERNIA REPAIR     umbilical    ILEOSCOPY N/A 03/25/2023   Procedure: ILEOSCOPY THROUGH STOMA;  Surgeon: Kerin Salen, MD;  Location: [facility];  Service: Gastroenterology;  Laterality: N/A;   INGUINAL HERNIA REPAIR Bilateral    IR FLUORO GUIDE CV LINE RIGHT  05/13/2021   IR PARACENTESIS  05/19/2021   IR US GUIDE VASC ACCESS RIGHT  05/13/2021   POLYPECTOMY  01/19/2019   Procedure: POLYPECTOMY;  Surgeon: Jeani Hawking, MD;  [facility];  Service: Endoscopy;;    Prior to Admission medications   Medication Sig Start Date End Date Taking? Authorizing Provider  carvedilol (COREG) 3.125 MG tablet Take 3.125 mg by mouth 2 (two) times daily with a meal.   Yes [provider]  folic acid (FOLVITE) 1 MG tablet Take 1 tablet (1 mg total) by mouth daily. 05/21/21  Yes Leroy Sea, MD  furosemide (LASIX) 20 MG tablet Take 1 tablet (20 mg total) by mouth daily. 04/08/23  Yes Pahwani, Daleen Bo, MD  gabapentin (NEURONTIN) 100 MG capsule Take 1 capsule (100 mg total) by mouth 3 (three)  times daily. Patient taking differently: Take 100 mg by mouth 3 (three) times daily. Take in addition to one 300mg  capsule at bedtime for a total daily dose of 600mg  04/08/23 05/19/24 Yes Pahwani, Daleen Bo, MD  gabapentin (NEURONTIN) 300 MG capsule Take 300 mg by mouth at bedtime. Take in addition to 100mg  three  times daily for a total daily dose of 600mg    Yes [provider]  Iron, Ferrous Sulfate, 325 (65 Fe) MG TABS Take 325 mg by mouth daily. Patient taking differently: Take 325 mg by mouth daily with breakfast. 12/18/21  Yes Nelwyn Salisbury, MD  ketoconazole (NIZORAL) 2 % cream Apply 1 Application topically 2 (two) times daily.   Yes [provider]  midodrine (PROAMATINE) 5 MG tablet Take 1 tablet (5 mg total) by mouth 3 (three) times daily with meals. 05/12/23 06/11/23 Yes Kc, Dayna Barker, MD  nicotine (NICODERM CQ - DOSED IN MG/24 HOURS) 14 mg/24hr patch Place 1 patch (14 mg total) onto the skin daily. 04/08/23 04/07/24 Yes Pahwani, Daleen Bo, MD  nystatin (MYCOSTATIN/NYSTOP) powder Apply 1 Application topically 3 (three) times daily. 05/03/23  Yes Nelwyn Salisbury, MD  pantoprazole (PROTONIX) 40 MG tablet Take 1 tablet (40 mg total) by mouth daily. 06/11/23  Yes Lanae Boast, MD  rifaximin (XIFAXAN) 550 MG TABS tablet Take 1 tablet (550 mg total) by mouth 2 (two) times daily. 01/20/22  Yes Nelwyn Salisbury, MD  sertraline (ZOLOFT) 50 MG tablet Take 1 tablet (50 mg total) by mouth daily. 02/02/23  Yes Nelwyn Salisbury, MD  spironolactone (ALDACTONE) 100 MG tablet Take 1 tablet (100 mg total) by mouth daily. 05/13/23 06/12/23 Yes Lanae Boast, MD  sucralfate (CARAFATE) 1 g tablet Take 1 tablet (1 g total) by mouth 4 (four) times daily -  with meals and at bedtime. 05/12/23 06/11/23 Yes Lanae Boast, MD  thiamine 100 MG tablet Take 1 tablet (100 mg total) by mouth daily. 05/21/21  Yes Leroy Sea, MD  nadolol (CORGARD) 20 MG tablet Take 1 tablet (20 mg total) by mouth daily. Patient not taking: Reported on 05/07/2023 12/05/21   Nelwyn Salisbury, MD    Current Facility-Administered Medications  Medication Dose Route Frequency Provider Last Rate Last Admin   ferrous sulfate tablet 325 mg  325 mg Oral Q breakfast Hillary Bow, DO   325 mg at 05/21/23 1610   folic acid (FOLVITE) tablet 1 mg  1 mg Oral  Daily Lyda Perone M, DO   1 mg at 05/21/23 9604   gabapentin (NEURONTIN) capsule 100 mg  100 mg Oral TID Hillary Bow, DO   100 mg at 05/21/23 5409   gabapentin (NEURONTIN) capsule 300 mg  300 mg Oral QHS Lyda Perone M, DO   300 mg at 05/20/23 2241   insulin aspart (novoLOG) injection 0-9 Units  0-9 Units Subcutaneous Q4H Lyda Perone M, DO   1 Units at 05/21/23 1149   midodrine (PROAMATINE) tablet 5 mg  5 mg Oral TID WC Lyda Perone M, DO   5 mg at 05/21/23 8119   morphine (PF) 2 MG/ML injection 2 mg  2 mg Intravenous Q4H PRN Hillary Bow, DO   2 mg at 05/21/23 1478   nadolol (CORGARD) tablet 20 mg  20 mg Oral Daily Lyda Perone M, DO   20 mg at 05/21/23 0830   nicotine (NICODERM CQ - dosed in mg/24 hours) patch 14 mg  14 mg Transdermal Daily Hillary Bow, DO   14  mg at 05/21/23 2440   nystatin (MYCOSTATIN/NYSTOP) topical powder 1 Application  1 Application Topical TID Hillary Bow, DO   1 Application at 05/21/23 1149   octreotide (SANDOSTATIN) 500 mcg in sodium chloride 0.9 % 250 mL (2 mcg/mL) infusion  50 mcg/hr Intravenous Continuous Hillary Bow, DO 25 mL/hr at 05/21/23 0002 50 mcg/hr at 05/21/23 0002   ondansetron (ZOFRAN) tablet 4 mg  4 mg Oral Q6H PRN Hillary Bow, DO       Or   ondansetron [facility]) injection 4 mg  4 mg Intravenous Q6H PRN Hillary Bow, DO       pantoprazole (PROTONIX) injection 40 mg  40 mg Intravenous Q12H Lyda Perone M, DO   40 mg at 05/21/23 1148   rifaximin (XIFAXAN) tablet 550 mg  550 mg Oral BID Hillary Bow, DO   550 mg at 05/21/23 1027   sertraline (ZOLOFT) tablet 50 mg  50 mg Oral Daily Lyda Perone M, DO   50 mg at 05/21/23 2536   sodium bicarbonate tablet 650 mg  650 mg Oral Daily Hillary Bow, DO   650 mg at 05/21/23 6440   sodium zirconium cyclosilicate (LOKELMA) packet 10 g  10 g Oral Daily Hughie Closs, MD       sucralfate (CARAFATE) tablet 1 g  1 g Oral TID WC & HS Hillary Bow, DO   1 g at 05/21/23  1145   thiamine (VITAMIN B1) tablet 100 mg  100 mg Oral Daily Lyda Perone M, DO   100 mg at 05/21/23 3474    Allergies as of 05/20/2023 - Review Complete 05/20/2023  Allergen Reaction Noted   Acetaminophen Other (See Comments) 10/19/2021   Ibuprofen Other (See Comments) 10/19/2021    Family History  Problem Relation Age of Onset   Hypertension Mother    Diabetes Father    Heart attack Father    Pneumonia Father    Hypertension Father    Diabetes Maternal Grandfather     Social History   Socioeconomic History   Marital status: Divorced    Spouse name: Not on file   Number of children: Not on file   Years of education: Not on file   Highest education level: Bachelor's degree (e.g., BA, AB, BS)  Occupational History   Not on file  Tobacco Use   Smoking status: Former    Current packs/day: 0.00    Types: Cigarettes    Quit date: 08/11/2001    Years since quitting: 21.7   Smokeless tobacco: Never  Substance and Sexual Activity   Alcohol use: Yes    Alcohol/week: 2.0 standard drinks of alcohol    Types: 2 Standard drinks or equivalent per week   Drug use: No   Sexual activity: Not on file  Other Topics Concern   Not on file  Social History Narrative   Not on file   Social Determinants of Health   Financial Resource Strain: Low Risk  (10/06/2022)   [facility]   Overall Financial Resource Strain (CARDIA)    Difficulty of Paying Living Expenses: Not hard at all  Food Insecurity: No Food Insecurity (05/20/2023)   Hunger Vital Sign    Worried About Running Out of Food in the Last Year: Never true    Ran Out of Food in the Last Year: Never true  Recent Concern: Food Insecurity - Food Insecurity Present (03/23/2023)  Hunger Vital Sign    Worried About Running Out of Food in the Last Year: Sometimes true    Ran Out of Food in the Last Year: Sometimes true  Transportation Needs: No  Transportation Needs (05/20/2023)   PRAPARE - Administrator, Civil Service (Medical): No    Lack of Transportation (Non-Medical): No  Recent Concern: Transportation Needs - Unmet Transportation Needs (03/23/2023)   PRAPARE - Administrator, Civil Service (Medical): Yes    Lack of Transportation (Non-Medical): Not on file  Physical Activity: Unknown (12/05/2021)   Exercise Vital Sign    Days of Exercise per Week: 0 days    Minutes of Exercise per Session: Not on file  Stress: Stress Concern Present (12/05/2021)   [employer] of Occupational Health - Occupational Stress Questionnaire    Feeling of Stress : Very much  Social Connections: Unknown (08/11/2022)   Received from [facility], [facility]   Social Network    Social Network: Not on file  Intimate Partner Violence: Not At Risk (05/20/2023)   Humiliation, Afraid, Rape, and Kick questionnaire    Fear of Current or Ex-Partner: No    Emotionally Abused: No    Physically Abused: No    Sexually Abused: No    Review of Systems: As per HPI all others negative  Physical Exam: Vital signs in last 24 hours: Temp:  [98.2 F (36.8 C)-98.9 F (37.2 C)] 98.4 F (36.9 C) (12/06 1047) Pulse Rate:  [60-81] 68 (12/06 1047) Resp:  [18-20] 18 (12/06 1047) BP: (105-142)/(70-85) 105/74 (12/06 1047) SpO2:  [99 %-100 %] 100 % (12/06 1047) Weight:  [79.4 kg] 79.4 kg (12/05 1517) Last BM Date : 05/21/23 General:   Alert,  frail and somewhat cachectic-appearing Head:  Normocephalic and atraumatic. Eyes:  Sclera icteric bilaterally   Conjunctiva pale Ears:  Normal auditory acuity. Nose:  No deformity, discharge,  or lesions. Mouth:  No deformity or lesions.  Oropharynx pale and dry Neck:  Supple; no masses or thyromegaly. Lungs:  No visible distress Abdomen:  Soft, R periumbilical ostomy, binder on, mild upper abdominal tenderness.  No masses, hepatosplenomegaly or hernias noted. Normal bowel sounds, without  guarding, and without rebound.     Msk:  Symmetrical without gross deformities. Normal posture. Pulses:  Normal pulses noted. Extremities:  Without clubbing or edema. Neurologic:  Alert and  oriented x4;  No encephalopathy Skin:  Scattered ecchymoses, otherwise Intact without significant lesions or rashes. Cervical Nodes:  No significant cervical adenopathy. Psych:  Alert and cooperative. Normal mood and affect.   Lab Results: Recent Labs    05/20/23 1534 05/20/23 2330 05/21/23 0532  WBC 4.0  --  6.2  HGB 8.3* 7.7* 7.4*  HCT 25.7* 23.3* 22.5*  PLT 87*  --  91*   BMET Recent Labs    05/20/23 1534 05/21/23 0532  NA 128* 134*  K 5.2* 5.4*  CL 105 110  CO2 16* 18*  GLUCOSE 502* 155*  BUN 50* 51*  CREATININE 1.92* 2.32*  CALCIUM 8.7* 8.8*   LFT Recent Labs    05/21/23 0532  PROT 6.7  ALBUMIN 2.5*  AST 38  ALT 18  ALKPHOS 100  BILITOT 3.0*   PT/INR Recent Labs    05/20/23 1534  LABPROT 20.4*  INR 1.7*    Studies/Results: CT Angio Abd/Pel W and/or Wo Contrast  Result Date: 05/20/2023 CLINICAL DATA:  Lower GI bleed. EXAM: CTA ABDOMEN AND PELVIS WITHOUT AND WITH CONTRAST TECHNIQUE: Multidetector  CT imaging of the abdomen and pelvis was performed using the standard protocol during bolus administration of intravenous contrast. Multiplanar reconstructed images and MIPs were obtained and reviewed to evaluate the vascular anatomy. RADIATION DOSE REDUCTION: This exam was performed according to the departmental dose-optimization program which includes automated exposure control, adjustment of the mA and/or kV according to patient size and/or use of iterative reconstruction technique. CONTRAST:  80mL OMNIPAQUE IOHEXOL 350 MG/ML SOLN COMPARISON:  05/08/2023 FINDINGS: VASCULAR Aorta: Aortic atherosclerotic calcifications. Normal caliber aorta without aneurysm, dissection, vasculitis or significant stenosis. Celiac: Patent without evidence of aneurysm, dissection, vasculitis or  significant stenosis. SMA: Patent without evidence of aneurysm, dissection, vasculitis or significant stenosis. Renals: Both renal arteries are patent without evidence of aneurysm, dissection, vasculitis, fibromuscular dysplasia or significant stenosis. IMA: Patent without evidence of aneurysm, dissection, vasculitis or significant stenosis. Inflow: Patent without evidence of aneurysm, dissection, vasculitis or significant stenosis. Proximal Outflow: Bilateral common femoral and visualized portions of the superficial and profunda femoral arteries are patent without evidence of aneurysm, dissection, vasculitis or significant stenosis. Veins: Patent. Left abdominal Porto systemic varix is identified, image 49/19. Distal esophageal varices. Review of the MIP images confirms the above findings. NON-VASCULAR Lower chest: No acute abnormality. Hepatobiliary: The uterus appears atrophic with diffuse Leigh nodular contour compatible with advanced cirrhosis. Too small to characterize low-density structure within segment 4 measures 6 mm, image 13/19. No enhancing liver lesions identified. There is mild diffuse bladder wall thickening which measures up to 5 mm, nonspecific in the setting of advanced cirrhosis and portal venous hypertension. No gallstones identified. No signs of bile duct dilatation. Pancreas: Unremarkable. No pancreatic ductal dilatation or surrounding inflammatory changes. Spleen: Splenomegaly. Spleen measures 15.2 cm in cranial caudal dimension. Adrenals/Urinary Tract: Normal appearance of the adrenal glands. Nonobstructing stone within the inferior pole of the right kidney measures 5 mm. Several left renal calculi are also noted which measure up to 3 mm. No signs of kidney mass or obstructive uropathy. Urinary bladder appears normal. Stomach/Bowel: Stomach appears nondistended. Mild wall thickening with surrounding soft tissue stranding is noted about the descending portion of the duodenum. Right-sided  paramidline ileostomy the is identified. No pathologic dilatation of the large or small bowel loops. postoperative changes from right hemicolectomy. Long segment of Hartmann's pouch is identified which terminates in the right upper quadrant of the abdomen.No signs of active intraluminal contrast extravasation within the large or small bowel loops to indicate site of suspected GI bleed. Lymphatic: No significant vascular findings are present. No enlarged abdominal or pelvic lymph nodes. Reproductive: Prostate is unremarkable. Other: Trace free fluid noted within the pelvis, image 75/19. No focal fluid collections identified to suggest abscess. Musculoskeletal: No acute or significant osseous findings. IMPRESSION: 1. No signs of active intraluminal contrast extravasation within the large or small bowel loops to indicate site of suspected GI bleed. 2. Advanced cirrhosis with signs of portal venous hypertension including splenomegaly, left abdominal Porto systemic varix and distal esophageal varices. 3. Wall thickening with surrounding soft tissue stranding about the descending portion of the duodenum. Findings are nonspecific in the setting of advanced cirrhosis and portal venous hypertension. Correlate for any clinical signs or symptoms of duodenitis 4. Nonobstructing bilateral renal calculi. 5. Aortic Atherosclerosis (ICD10-I70.0). Electronically Signed   By: Signa Kell M.D.   On: 05/20/2023 19:17    Impression:   Blood per ostomy.  Suspect from duodenitis and portal gastropathy.  Do not suspect variceal bleed (endoscopy couple weeks ago small varices) and no evidence  of peristomal varices on imaging.  Resolved.  No hematemesis. Acute on chronic anemia. Colon cancer s/p right hemicolectomy and ileostomy as well as long Hartmann's pouch. Alcohol mediated cirrhosis. Chronic kidney disease.  Plan:   Octreotide x another 1-2 days, then propranolol 10 mg po bid. Patient receives GI care in [place] but  lives here in [place].  This does not seem ideal given the severity of his GI conditions.  Alcohol cessation.  I am very confused why patient ended up with extensive Hartmann's pouch (essentially to the transverse colon) and ileostomy after right hemicolectomy.  Patient doesn't know.  At the very least he'd need surveillance colonoscopies through the pouch which he's not received and prep for which (enemas only) would be almost impossible to clean out adequately.  I suspect he was supposed to have been put back in bowel continuity long ago but this wasn't done, perhaps due to worsening cirrhosis?  In either event, multiple issues as above need to be addressed with his primary surgeon and GI doctor in [place] as outpatient. Do not see need for repeat endoscopy, he's had several endoscopic procedures recently. Soft diet. [facility] will follow.   LOS: 0 days   Hadia Minier M  05/21/2023, 11:52 AM  Cell [phone] If no answer or after 5 PM call [phone]

## 2023-05-21 NOTE — Plan of Care (Signed)

## 2023-05-21 NOTE — Progress Notes (Signed)
PROGRESS NOTE    Martin Anthony  KVQ:[id number] DOB: 1966/03/28 DOA: 05/20/2023 PCP: Nelwyn Salisbury, MD   Brief Narrative:  Martin Anthony is a 57 y.o. 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  EGD did also reveal a small esophageal varix as well but this wasn't bleeding and had no stigmata of recent bleed.   Last EtOH = Sept 2024.  Sober for 3 months now.   Pt presented to ED with recurrent diffuse abd pain, and recurrent dark blood with clots in ostomy bag 5 times today.  Symptoms are similar / the same as last week when he was admitted.    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 Has a small esophageal varix as well on that EGD that wasn't bleeding and no stigmata of recent bleed.  Baseline hemoglobin remains around 8, currently 7.4, no indication of transfusion.  GI consulted, pending evaluation, keeping n.p.o. in the meantime.  Has been started on octreotide per GI recommendations.  Continue PPI, rifaximin.  Mild acute pancreatitis: Abdominal pain in combination with slightly elevated lipase level confirms diagnosis of mild pancreatitis.  This could be because of his abdominal pain.  Treat this conservatively.  Currently n.p.o. awaiting GI evaluation.   Type 2 diabetes mellitus with neurological complications (HCC) Previous history of  diabetes, however hemoglobin A1c has remained either in prediabetes or normal range since last 4 years.  Hyperglycemic in 500s in ED with no anion gap. Got 10u novolog x1 in ED.  Blood sugar controlled, continue SSI.  Check hemoglobin A1c.   Alcoholic cirrhosis of liver with ascites (HCC) Pt with ESLD, last drink in Sept it seems.  Creatinine rising, continue to hold diuretics for now. Pt unable to obtain nadolol so kept taking coreg after discharge on 11/27.  Switched to nadolol upon admission.   Hypotension: No history of hypertension.  Hypertension ruled out. On midodrine, blood pressure controlled.   PAF (paroxysmal atrial fibrillation) (HCC) Chads Vasc of 4 but Has Bled score of > 3 = not candidate for [facility] Also having acute GIB today.   AKI on CKD stage IIIb: Baseline creatinine around 1.9, jumped to 2.32 today.  Will watch and avoid nephrotoxic agents.   Hyperkalemia: 5.4 today.  Will start on Lokelma.  DVT prophylaxis: SCDs Start: 05/20/23 2118   Code Status: Full Code  Family Communication:  None present at bedside.  Plan of care discussed with patient in length and he/she verbalized understanding and agreed with it.  Status is: Observation The patient will require care spanning > 2 midnights and should be moved to inpatient because: Needs GI evaluation and may need EGD   Estimated body mass index is 21.3 kg/m as calculated from the following:   Height as of this encounter: 6\' 4"  (1.93 m).   Weight as of this encounter: 79.4  kg.    Nutritional Assessment: Body mass index is 21.3 kg/m.Marland Kitchen Seen by dietician.  I agree with the assessment and plan as outlined below: Nutrition Status:        . Skin Assessment: I have examined the patient's skin and I agree with the wound assessment as performed by the wound care RN as outlined below:    Consultants:  GI  Procedures:  As above  Antimicrobials:  Anti-infectives (From admission, onward)    Start     Dose/Rate Route  Frequency Ordered Stop   05/20/23 2200  rifaximin (XIFAXAN) tablet 550 mg        550 mg Oral 2 times daily 05/20/23 2057           Subjective: Patient seen and examined.  Still complains of abdominal pain, 7 out of 10.  No other complaint.  Eager to eat.  Objective: Vitals:   05/20/23 2200 05/20/23 2236 05/21/23 0233 05/21/23 0652  BP: 129/80 121/85 125/78 115/79  Pulse: 74 81 74 78  Resp: 20     Temp: 98.3 F (36.8 C) 98.2 F (36.8 C) 98.9 F (37.2 C) 98.9 F (37.2 C)  TempSrc: Oral Oral Oral Oral  SpO2: 100% 100% 99% 100%  Weight:      Height:        Intake/Output Summary (Last 24 hours) at 05/21/2023 0830 Last data filed at 05/21/2023 0406 Gross per 24 hour  Intake 1100.76 ml  Output --  Net 1100.76 ml   Filed Weights   05/20/23 1517  Weight: 79.4 kg    Examination:  General exam: Appears calm and comfortable  Respiratory system: Clear to auscultation. Respiratory effort normal. Cardiovascular system: S1 & S2 heard, RRR. No JVD, murmurs, rubs, gallops or clicks. No pedal edema. Gastrointestinal system: Abdomen is nondistended, soft and mild epigastric tenderness. No organomegaly or masses felt. Normal bowel sounds heard. Central nervous system: Alert and oriented. No focal neurological deficits. Extremities: Symmetric 5 x 5 power. Skin: No rashes, lesions or ulcers Psychiatry: Judgement and insight appear normal. Mood & affect appropriate.    Data Reviewed: I have personally reviewed following labs and imaging studies  CBC: Recent Labs  Lab 05/20/23 1534 05/20/23 2330 05/21/23 0532  WBC 4.0  --  6.2  NEUTROABS 3.0  --   --   HGB 8.3* 7.7* 7.4*  HCT 25.7* 23.3* 22.5*  MCV 99.2  --  97.8  PLT 87*  --  91*   Basic Metabolic Panel: Recent Labs  Lab 05/20/23 1534 05/21/23 0532  NA 128* 134*  K 5.2* 5.4*  CL 105 110  CO2 16* 18*  GLUCOSE 502* 155*  BUN 50* 51*  CREATININE 1.92* 2.32*  CALCIUM 8.7* 8.8*   GFR: Estimated Creatinine Clearance:  39.9 mL/min (A) (by C-G formula based on SCr of 2.32 mg/dL (H)). Liver Function Tests: Recent Labs  Lab 05/20/23 1534 05/21/23 0532  AST 40 38  ALT 18 18  ALKPHOS 113 100  BILITOT 2.7* 3.0*  PROT 6.6 6.7  ALBUMIN 2.3* 2.5*   Recent Labs  Lab 05/20/23 1534  LIPASE 438*   Recent Labs  Lab 05/20/23 2330  AMMONIA 34   Coagulation Profile: Recent Labs  Lab 05/20/23 1534  INR 1.7*   Cardiac Enzymes: No results for input(s): "CKTOTAL", "CKMB", "CKMBINDEX", "TROPONINI" in the last 168 hours. BNP (last 3 results) No results for input(s): "PROBNP" in the last 8760 hours. HbA1C: No results for input(s): "HGBA1C" in the last 72 hours. CBG:  Recent Labs  Lab 05/20/23 2017 05/20/23 2241 05/21/23 0025 05/21/23 0443 05/21/23 0758  GLUCAP 420* 350* 294* 152* 114*   Lipid Profile: No results for input(s): "CHOL", "HDL", "LDLCALC", "TRIG", "CHOLHDL", "LDLDIRECT" in the last 72 hours. Thyroid Function Tests: No results for input(s): "TSH", "T4TOTAL", "FREET4", "T3FREE", "THYROIDAB" in the last 72 hours. Anemia Panel: No results for input(s): "VITAMINB12", "FOLATE", "FERRITIN", "TIBC", "IRON", "RETICCTPCT" in the last 72 hours. Sepsis Labs: No results for input(s): "PROCALCITON", "LATICACIDVEN" in the last 168 hours.  Recent Results (from the past 240 hour(s))  Aerobic/Anaerobic Culture w Gram Stain (surgical/deep wound)     Status: None   Collection Time: 05/12/23 10:42 AM   Specimen: PATH Cytology Peritoneal fluid  Result Value Ref Range Status   Specimen Description   Final    PERITONEAL Performed at [facility], [address] [place], [place] [address]    Special Requests   Final    NONE Performed at [facility], [address], [place], [place] [address]    Gram Stain   Final    RARE WBC PRESENT, PREDOMINANTLY MONONUCLEAR NO ORGANISMS SEEN    Culture   Final    No growth aerobically or anaerobically. Performed at [facility], [address], [place], [place] [address]    Report Status 05/17/2023 FINAL  Final  Acid Fast Smear (AFB)     Status: None   Collection Time: 05/12/23 10:42 AM   Specimen: PATH Cytology Peritoneal fluid  Result Value Ref Range Status   AFB Specimen Processing Concentration  Final   Acid Fast Smear Negative  Final    Comment: (NOTE) Performed At: [facility] [address] [place], [place] [id number] Jolene Schimke MD UY:[id number]    Source (AFB) PERITONEAL  Final    Comment: Performed at [facility], [address] [place], [place] [address]     Radiology Studies: CT Angio Abd/Pel W and/or Wo Contrast  Result Date: 05/20/2023 CLINICAL DATA:  Lower GI bleed. EXAM: CTA ABDOMEN AND PELVIS WITHOUT AND WITH CONTRAST TECHNIQUE: Multidetector CT imaging of the abdomen and pelvis was performed using the standard protocol during bolus administration of intravenous contrast. Multiplanar reconstructed images and MIPs were obtained and reviewed to evaluate the vascular anatomy. RADIATION DOSE REDUCTION: This exam was performed according to the departmental dose-optimization program which includes automated exposure control, adjustment of the mA and/or kV according to patient size and/or use of iterative reconstruction technique. CONTRAST:  80mL OMNIPAQUE IOHEXOL 350 MG/ML SOLN COMPARISON:  05/08/2023 FINDINGS: VASCULAR Aorta: Aortic atherosclerotic calcifications. Normal caliber aorta without aneurysm, dissection, vasculitis or significant stenosis. Celiac: Patent without evidence of aneurysm, dissection, vasculitis or significant stenosis. SMA: Patent without evidence of aneurysm, dissection, vasculitis or significant stenosis. Renals: Both renal arteries are patent without evidence of aneurysm, dissection, vasculitis, fibromuscular dysplasia or significant stenosis. IMA: Patent without evidence of aneurysm, dissection, vasculitis or significant stenosis.  Inflow: Patent without evidence of aneurysm, dissection, vasculitis or significant stenosis. Proximal Outflow: Bilateral common femoral and visualized portions of the superficial and profunda femoral arteries are patent without evidence of aneurysm, dissection, vasculitis or significant stenosis. Veins: Patent. Left abdominal Porto systemic varix is identified, image 49/19. Distal esophageal varices. Review of the MIP images confirms the above findings. NON-VASCULAR Lower chest: No acute abnormality. Hepatobiliary: The uterus appears atrophic with diffuse Leigh nodular contour compatible with advanced cirrhosis. Too small to characterize low-density structure within segment 4 measures 6 mm, image 13/19.  No enhancing liver lesions identified. There is mild diffuse bladder wall thickening which measures up to 5 mm, nonspecific in the setting of advanced cirrhosis and portal venous hypertension. No gallstones identified. No signs of bile duct dilatation. Pancreas: Unremarkable. No pancreatic ductal dilatation or surrounding inflammatory changes. Spleen: Splenomegaly. Spleen measures 15.2 cm in cranial caudal dimension. Adrenals/Urinary Tract: Normal appearance of the adrenal glands. Nonobstructing stone within the inferior pole of the right kidney measures 5 mm. Several left renal calculi are also noted which measure up to 3 mm. No signs of kidney mass or obstructive uropathy. Urinary bladder appears normal. Stomach/Bowel: Stomach appears nondistended. Mild wall thickening with surrounding soft tissue stranding is noted about the descending portion of the duodenum. Right-sided paramidline ileostomy the is identified. No pathologic dilatation of the large or small bowel loops. postoperative changes from right hemicolectomy. Long segment of Hartmann's pouch is identified which terminates in the right upper quadrant of the abdomen.No signs of active intraluminal contrast extravasation within the large or small bowel loops  to indicate site of suspected GI bleed. Lymphatic: No significant vascular findings are present. No enlarged abdominal or pelvic lymph nodes. Reproductive: Prostate is unremarkable. Other: Trace free fluid noted within the pelvis, image 75/19. No focal fluid collections identified to suggest abscess. Musculoskeletal: No acute or significant osseous findings. IMPRESSION: 1. No signs of active intraluminal contrast extravasation within the large or small bowel loops to indicate site of suspected GI bleed. 2. Advanced cirrhosis with signs of portal venous hypertension including splenomegaly, left abdominal Porto systemic varix and distal esophageal varices. 3. Wall thickening with surrounding soft tissue stranding about the descending portion of the duodenum. Findings are nonspecific in the setting of advanced cirrhosis and portal venous hypertension. Correlate for any clinical signs or symptoms of duodenitis 4. Nonobstructing bilateral renal calculi. 5. Aortic Atherosclerosis (ICD10-I70.0). Electronically Signed   By: Signa Kell M.D.   On: 05/20/2023 19:17    Scheduled Meds:  ferrous sulfate  325 mg Oral Q breakfast   folic acid  1 mg Oral Daily   gabapentin  100 mg Oral TID   gabapentin  300 mg Oral QHS   insulin aspart  0-9 Units Subcutaneous Q4H   midodrine  5 mg Oral TID WC   nadolol  20 mg Oral Daily   nicotine  14 mg Transdermal Daily   nystatin  1 Application Topical TID   pantoprazole (PROTONIX) IV  40 mg Intravenous Q12H   rifaximin  550 mg Oral BID   sertraline  50 mg Oral Daily   sodium bicarbonate  650 mg Oral Daily   sucralfate  1 g Oral TID WC & HS   thiamine  100 mg Oral Daily   Continuous Infusions:  octreotide (SANDOSTATIN) 500 mcg in sodium chloride 0.9 % 250 mL (2 mcg/mL) infusion 50 mcg/hr (05/21/23 0002)     LOS: 0 days   Hughie Closs, MD [facility]  05/21/2023, 8:30 AM   *Please note that this is a verbal dictation therefore any spelling or grammatical  errors are due to the "[employer] One" system interpretation.  Please page via Amion and do not message via secure chat for urgent patient care matters. Secure chat can be used for non urgent patient care matters.  How to contact the [facility] Attending or Consulting provider 7A - 7P or covering provider during after hours 7P -7A, for this patient?  Check the care team in [facility] and look for a) attending/consulting [facility] provider listed and  b) the [facility] team listed. Page or secure chat 7A-7P. Log into [email or web address] and use [place]'s universal password to access. If you do not have the password, please contact the hospital operator. Locate the [facility] provider you are looking for under [facility] and page to a number that you can be directly reached. If you still have difficulty reaching the provider, please page the [facility] (Director on Call) for the Hospitalists listed on amion for assistance.

## 2023-05-21 NOTE — Progress Notes (Signed)
Patient reports a 'very small amount of clots like specks' no bigger than a dime in ileostomy when emptying bag. Patient dumped bag prior to RN seeing stool. Patient educated to call RN to see stool prior to dumping to assess bleeding; he verbalized understanding.

## 2023-05-21 NOTE — TOC CM/SW Note (Signed)
Transition of Care [facility]) - Inpatient Brief Assessment   Patient Details  Name: Martin Anthony MRN: [id number] Date of Birth: [birth year]  Transition of Care [facility]) CM/SW Contact:    Howell Rucks, RN Phone Number: 05/21/2023, 12:33 PM   Clinical Narrative: Met with pt at bedside to introduce role of TOC/NCM and review for  dc planning. Pt reports he has an established PCP and pharmacy, no current home care services, reports he has a cane he uses as needed, reports he feels safe returning home with suppport from friends, reports he may need assist with transportation at discharge but he has used [place] before. TOC Brief Assessment completed. No TOC needs identified at this time.     Transition of Care Asessment: Insurance and Status: Insurance coverage has been reviewed Patient has primary care physician: Yes Home environment has been reviewed: private residence Prior level of function:: Independent with use of cane as needed Prior/Current Home Services: No current home services Social Determinants of Health Reivew: SDOH reviewed no interventions necessary Readmission risk has been reviewed: Yes Transition of care needs: no transition of care needs at this time

## 2023-05-22 DIAGNOSIS — K2981 Duodenitis with bleeding: Secondary | ICD-10-CM | POA: Diagnosis not present

## 2023-05-22 LAB — TYPE AND SCREEN
ABO/RH(D): O POS
Antibody Screen: NEGATIVE
Unit division: 0

## 2023-05-22 LAB — BASIC METABOLIC PANEL
Anion gap: 7 (ref 5–15)
BUN: 71 mg/dL — ABNORMAL HIGH (ref 6–20)
CO2: 16 mmol/L — ABNORMAL LOW (ref 22–32)
Calcium: 7.9 mg/dL — ABNORMAL LOW (ref 8.9–10.3)
Chloride: 108 mmol/L (ref 98–111)
Creatinine, Ser: 4.31 mg/dL — ABNORMAL HIGH (ref 0.61–1.24)
GFR, Estimated: 15 mL/min — ABNORMAL LOW (ref >60)
Glucose, Bld: 151 mg/dL — ABNORMAL HIGH (ref 70–99)
Potassium: 6.1 mmol/L — ABNORMAL HIGH (ref 3.5–5.1)
Sodium: 131 mmol/L — ABNORMAL LOW (ref 135–145)

## 2023-05-22 LAB — CBC WITH DIFFERENTIAL/PLATELET
Abs Immature Granulocytes: 0.06 10*3/uL (ref 0.00–0.07)
Basophils Absolute: 0 10*3/uL (ref 0.0–0.1)
Basophils Relative: 1 %
Eosinophils Absolute: 0.2 10*3/uL (ref 0.0–0.5)
Eosinophils Relative: 4 %
HCT: 18.6 % — ABNORMAL LOW (ref 39.0–52.0)
Hemoglobin: 5.9 g/dL — CL (ref 13.0–17.0)
Immature Granulocytes: 1 %
Lymphocytes Relative: 15 %
Lymphs Abs: 1 10*3/uL (ref 0.7–4.0)
MCH: 32.1 pg (ref 26.0–34.0)
MCHC: 31.7 g/dL (ref 30.0–36.0)
MCV: 101.1 fL — ABNORMAL HIGH (ref 80.0–100.0)
Monocytes Absolute: 0.7 10*3/uL (ref 0.1–1.0)
Monocytes Relative: 11 %
Neutro Abs: 4.4 10*3/uL (ref 1.7–7.7)
Neutrophils Relative %: 68 %
Platelets: 80 10*3/uL — ABNORMAL LOW (ref 150–400)
RBC: 1.84 MIL/uL — ABNORMAL LOW (ref 4.22–5.81)
RDW: 14.9 % (ref 11.5–15.5)
WBC: 6.4 10*3/uL (ref 4.0–10.5)
nRBC: 0 % (ref 0.0–0.2)

## 2023-05-22 LAB — GLUCOSE, CAPILLARY
Glucose-Capillary: 117 mg/dL — ABNORMAL HIGH (ref 70–99)
Glucose-Capillary: 124 mg/dL — ABNORMAL HIGH (ref 70–99)
Glucose-Capillary: 134 mg/dL — ABNORMAL HIGH (ref 70–99)
Glucose-Capillary: 168 mg/dL — ABNORMAL HIGH (ref 70–99)
Glucose-Capillary: 155 mg/dL — ABNORMAL HIGH (ref 70–99)
Glucose-Capillary: 153 mg/dL — ABNORMAL HIGH (ref 70–99)

## 2023-05-22 LAB — POTASSIUM: Potassium: 6.1 mmol/L — ABNORMAL HIGH (ref 3.5–5.1)

## 2023-05-22 LAB — BPAM RBC
Blood Product Expiration Date: 202412312359
Unit Type and Rh: 5100

## 2023-05-22 LAB — HEMOGLOBIN AND HEMATOCRIT, BLOOD
HCT: 20.2 % — ABNORMAL LOW (ref 39.0–52.0)
Hemoglobin: 6.6 g/dL — CL (ref 13.0–17.0)

## 2023-05-22 LAB — PREPARE RBC (CROSSMATCH)

## 2023-05-22 MED ORDER — SODIUM CHLORIDE 0.9% IV SOLUTION
Freq: Once | INTRAVENOUS | Status: DC
Start: 2023-05-22 — End: 2023-05-23

## 2023-05-22 MED ORDER — SODIUM CHLORIDE 0.9% IV SOLUTION: Freq: Once | INTRAVENOUS | Status: AC

## 2023-05-22 MED ORDER — INSULIN ASPART 100 UNIT/ML IV SOLN
10.0000 [IU] | Freq: Once | INTRAVENOUS | Status: AC
  Administered 2023-05-22: 10 [IU] via INTRAVENOUS

## 2023-05-22 MED ORDER — SODIUM ZIRCONIUM CYCLOSILICATE 10 G PO PACK
10.0000 g | PACK | Freq: Two times a day (BID) | ORAL | Status: DC
  Administered 2023-05-22 – 2023-05-23 (×2): 10 g via ORAL
  Filled 2023-05-22 (×2): qty 1

## 2023-05-22 MED ORDER — DEXTROSE 50 % IV SOLN
1.0000 | INTRAVENOUS | Status: AC
  Administered 2023-05-22: 50 mL via INTRAVENOUS
  Filled 2023-05-22: qty 50

## 2023-05-22 NOTE — Progress Notes (Addendum)
PROGRESS NOTE    Martin Anthony  XBJ:[id number] DOB: 13-Sep-1965 DOA: 05/20/2023 PCP: Nelwyn Salisbury, MD   Brief Narrative:  Martin Anthony is a 57 y.o. 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  EGD did also reveal a small esophageal varix as well but this wasn't bleeding and had no stigmata of recent bleed.   Last EtOH = Sept 2024.  Sober for 3 months now.   Pt presented to ED with recurrent diffuse abd pain, and recurrent dark blood with clots in ostomy bag 5 times today.  Symptoms are similar / the same as last week when he was admitted.    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 Has a small esophageal varix as well on that EGD that wasn't bleeding and no stigmata of recent bleed.  GI consulted, seen by Dr. Dulce Sellar, he recommended continuing octreotide for 1-2 more days and then transitioning to oral propranolol 10 mg p.o. twice daily but did not recommend any intervention as inpatient and recommended follow-up with his primary GI as outpatient for surveillance colonoscopy and possible EGD.  Baseline hemoglobin around 8 however it dropped to six 5.9 today, will transfuse 1 unit PRBC and check posttransfusion hemoglobin. Continue PPI, rifaximin.  Mild acute pancreatitis:  Abdominal pain in combination with slightly elevated lipase level confirms diagnosis of mild pancreatitis.  Patient tolerating soft diet.  Will keep him on soft diet again today.   Type 2 diabetes mellitus with neurological complications (HCC) Previous history of diabetes, however hemoglobin A1c has remained either in prediabetes or normal range since last 4 years.  Hyperglycemic in 500s in ED with no anion gap. Got 10u novolog x1 in ED.  Blood sugar controlled, continue SSI.  Repeat hemoglobin A1c 6.2 this admission.   Alcoholic cirrhosis of liver with ascites (HCC) Pt with ESLD, last drink in Sept it seems.  Creatinine rising, continue to hold diuretics for now. Pt unable to obtain nadolol so kept taking coreg after discharge on 11/27.  Switched to nadolol upon admission.   Hypotension: No history of hypertension.  Hypertension ruled out. On midodrine, blood pressure on the low side but he is asymptomatic.   PAF (paroxysmal atrial fibrillation) (HCC) Chads Vasc of 4 but Has Bled score of > 3 = not candidate for [facility] Also having acute GIB today.   AKI on CKD stage IIIb: Baseline creatinine around 1.9, significant rise and 4.3 today.  Will watch, transfuse and avoid nephrotoxic agents.   Hyperkalemia: 6.1, worsened today.  Will increase Lokelma dose to twice daily.  Will try dextrose and insulin.  Monitor on telemetry.  Repeat potassium this afternoon.  DVT prophylaxis: SCDs Start: 05/20/23 2118   Code Status: Full Code  Family Communication:  None present at bedside.  Plan  of care discussed with patient in length and he/she verbalized understanding and agreed with it.  Status is: Inpatient Remains inpatient appropriate because: Anemia, require blood transfusion.     Estimated body mass index is 21.3 kg/m as calculated from the following:   Height as of this encounter: 6\' 4"  (1.93 m).   Weight as of this encounter: 79.4 kg.    Nutritional Assessment: Body mass index is 21.3  kg/m.Marland Kitchen Seen by dietician.  I agree with the assessment and plan as outlined below: Nutrition Status:        . Skin Assessment: I have examined the patient's skin and I agree with the wound assessment as performed by the wound care RN as outlined below:    Consultants:  GI  Procedures:  As above  Antimicrobials:  Anti-infectives (From admission, onward)    Start     Dose/Rate Route Frequency Ordered Stop   05/20/23 2200  rifaximin (XIFAXAN) tablet 550 mg        550 mg Oral 2 times daily 05/20/23 2057           Subjective: Seen examined.  Still complains of abdominal pain, slightly better than yesterday, 8 out of 10 today.  No other complaint.  Tolerating soft diet.  Objective: Vitals:   05/21/23 1047 05/21/23 1551 05/21/23 1956 05/22/23 0441  BP: 105/74 (!) 93/54 (!) 100/58 (!) 94/47  Pulse: 68 (!) 56 (!) 54 60  Resp: 18 12 18 18   Temp: 98.4 F (36.9 C) 98.7 F (37.1 C) 98.4 F (36.9 C) 98.6 F (37 C)  TempSrc: Oral Oral Oral Oral  SpO2: 100% 100% 100% 99%  Weight:      Height:       No intake or output data in the 24 hours ending 05/22/23 0821  Filed Weights   05/20/23 1517  Weight: 79.4 kg    Examination:  General exam: Appears calm and comfortable  Respiratory system: Clear to auscultation. Respiratory effort normal. Cardiovascular system: S1 & S2 heard, RRR. No JVD, murmurs, rubs, gallops or clicks. No pedal edema. Gastrointestinal system: Abdomen is nondistended, soft and epigastric and right upper quadrant tenderness. No organomegaly or masses felt. Normal bowel sounds heard.  Ileostomy bag noted. Central nervous system: Alert and oriented. No focal neurological deficits. Extremities: Symmetric 5 x 5 power. Skin: No rashes, lesions or ulcers.  Psychiatry: Judgement and insight appear normal. Mood & affect appropriate.   Data Reviewed: I have personally reviewed following labs and imaging studies  CBC: Recent Labs  Lab 05/20/23 1534  05/20/23 2330 05/21/23 0532 05/22/23 0733  WBC 4.0  --  6.2 6.4  NEUTROABS 3.0  --   --  4.4  HGB 8.3* 7.7* 7.4* 5.9*  HCT 25.7* 23.3* 22.5* 18.6*  MCV 99.2  --  97.8 101.1*  PLT 87*  --  91* 80*   Basic Metabolic Panel: Recent Labs  Lab 05/20/23 1534 05/21/23 0532  NA 128* 134*  K 5.2* 5.4*  CL 105 110  CO2 16* 18*  GLUCOSE 502* 155*  BUN 50* 51*  CREATININE 1.92* 2.32*  CALCIUM 8.7* 8.8*   GFR: Estimated Creatinine Clearance: 39.9 mL/min (A) (by C-G formula based on SCr of 2.32 mg/dL (H)). Liver Function Tests: Recent Labs  Lab 05/20/23 1534 05/21/23 0532  AST 40 38  ALT 18 18  ALKPHOS 113 100  BILITOT 2.7* 3.0*  PROT 6.6 6.7  ALBUMIN 2.3* 2.5*   Recent Labs  Lab 05/20/23 1534  LIPASE 438*  Recent Labs  Lab 05/20/23 2330  AMMONIA 34   Coagulation Profile: Recent Labs  Lab 05/20/23 1534  INR 1.7*   Cardiac Enzymes: No results for input(s): "CKTOTAL", "CKMB", "CKMBINDEX", "TROPONINI" in the last 168 hours. BNP (last 3 results) No results for input(s): "PROBNP" in the last 8760 hours. HbA1C: Recent Labs    05/21/23 0530  HGBA1C 6.2*   CBG: Recent Labs  Lab 05/21/23 1958 05/21/23 2353 05/22/23 0118 05/22/23 0437 05/22/23 0741  GLUCAP 193* 139* 117* 124* 134*   Lipid Profile: No results for input(s): "CHOL", "HDL", "LDLCALC", "TRIG", "CHOLHDL", "LDLDIRECT" in the last 72 hours. Thyroid Function Tests: No results for input(s): "TSH", "T4TOTAL", "FREET4", "T3FREE", "THYROIDAB" in the last 72 hours. Anemia Panel: No results for input(s): "VITAMINB12", "FOLATE", "FERRITIN", "TIBC", "IRON", "RETICCTPCT" in the last 72 hours. Sepsis Labs: No results for input(s): "PROCALCITON", "LATICACIDVEN" in the last 168 hours.  Recent Results (from the past 240 hour(s))  Aerobic/Anaerobic Culture w Gram Stain (surgical/deep wound)     Status: None   Collection Time: 05/12/23 10:42 AM   Specimen: PATH Cytology Peritoneal fluid  Result Value Ref Range  Status   Specimen Description   Final    PERITONEAL Performed at [facility], [address] [place], [place] [address]    Special Requests   Final    NONE Performed at [facility], [address], [place], [place] [address]    Gram Stain   Final    RARE WBC PRESENT, PREDOMINANTLY MONONUCLEAR NO ORGANISMS SEEN    Culture   Final    No growth aerobically or anaerobically. Performed at [facility], [address] [place], [place] [address]    Report Status 05/17/2023 FINAL  Final  Acid Fast Smear (AFB)     Status: None   Collection Time: 05/12/23 10:42 AM   Specimen: PATH Cytology Peritoneal fluid  Result Value Ref Range Status   AFB Specimen Processing Concentration  Final   Acid Fast Smear Negative  Final    Comment: (NOTE) Performed At: [facility] [address] [place], [place] [id number] Jolene Schimke MD ON:[id number]    Source (AFB) PERITONEAL  Final    Comment: Performed at [facility], [address], [place], [place] [address]     Radiology Studies: CT Angio Abd/Pel W and/or Wo Contrast  Result Date: 05/20/2023 CLINICAL DATA:  Lower GI bleed. EXAM: CTA ABDOMEN AND PELVIS WITHOUT AND WITH CONTRAST TECHNIQUE: Multidetector CT imaging of the abdomen and pelvis was performed using the standard protocol during bolus administration of intravenous contrast. Multiplanar reconstructed images and MIPs were obtained and reviewed to evaluate the vascular anatomy. RADIATION DOSE REDUCTION: This exam was performed according to the departmental dose-optimization program which includes automated exposure control, adjustment of the mA and/or kV according to patient size and/or use of iterative reconstruction technique. CONTRAST:  80mL OMNIPAQUE IOHEXOL 350 MG/ML SOLN COMPARISON:  05/08/2023 FINDINGS: VASCULAR Aorta: Aortic atherosclerotic calcifications. Normal caliber aorta without aneurysm, dissection,  vasculitis or significant stenosis. Celiac: Patent without evidence of aneurysm, dissection, vasculitis or significant stenosis. SMA: Patent without evidence of aneurysm, dissection, vasculitis or significant stenosis. Renals: Both renal arteries are patent without evidence of aneurysm, dissection, vasculitis, fibromuscular dysplasia or significant stenosis. IMA: Patent without evidence of aneurysm, dissection, vasculitis or significant stenosis. Inflow: Patent without evidence of aneurysm, dissection, vasculitis or significant stenosis. Proximal Outflow: Bilateral common femoral and visualized portions of the superficial and profunda femoral arteries are  patent without evidence of aneurysm, dissection, vasculitis or significant stenosis. Veins: Patent. Left abdominal Porto systemic varix is identified, image 49/19. Distal esophageal varices. Review of the MIP images confirms the above findings. NON-VASCULAR Lower chest: No acute abnormality. Hepatobiliary: The uterus appears atrophic with diffuse Leigh nodular contour compatible with advanced cirrhosis. Too small to characterize low-density structure within segment 4 measures 6 mm, image 13/19. No enhancing liver lesions identified. There is mild diffuse bladder wall thickening which measures up to 5 mm, nonspecific in the setting of advanced cirrhosis and portal venous hypertension. No gallstones identified. No signs of bile duct dilatation. Pancreas: Unremarkable. No pancreatic ductal dilatation or surrounding inflammatory changes. Spleen: Splenomegaly. Spleen measures 15.2 cm in cranial caudal dimension. Adrenals/Urinary Tract: Normal appearance of the adrenal glands. Nonobstructing stone within the inferior pole of the right kidney measures 5 mm. Several left renal calculi are also noted which measure up to 3 mm. No signs of kidney mass or obstructive uropathy. Urinary bladder appears normal. Stomach/Bowel: Stomach appears nondistended. Mild wall thickening  with surrounding soft tissue stranding is noted about the descending portion of the duodenum. Right-sided paramidline ileostomy the is identified. No pathologic dilatation of the large or small bowel loops. postoperative changes from right hemicolectomy. Long segment of Hartmann's pouch is identified which terminates in the right upper quadrant of the abdomen.No signs of active intraluminal contrast extravasation within the large or small bowel loops to indicate site of suspected GI bleed. Lymphatic: No significant vascular findings are present. No enlarged abdominal or pelvic lymph nodes. Reproductive: Prostate is unremarkable. Other: Trace free fluid noted within the pelvis, image 75/19. No focal fluid collections identified to suggest abscess. Musculoskeletal: No acute or significant osseous findings. IMPRESSION: 1. No signs of active intraluminal contrast extravasation within the large or small bowel loops to indicate site of suspected GI bleed. 2. Advanced cirrhosis with signs of portal venous hypertension including splenomegaly, left abdominal Porto systemic varix and distal esophageal varices. 3. Wall thickening with surrounding soft tissue stranding about the descending portion of the duodenum. Findings are nonspecific in the setting of advanced cirrhosis and portal venous hypertension. Correlate for any clinical signs or symptoms of duodenitis 4. Nonobstructing bilateral renal calculi. 5. Aortic Atherosclerosis (ICD10-I70.0). Electronically Signed   By: Signa Kell M.D.   On: 05/20/2023 19:17    Scheduled Meds:  sodium chloride   Intravenous Once   ferrous sulfate  325 mg Oral Q breakfast   folic acid  1 mg Oral Daily   gabapentin  100 mg Oral TID   gabapentin  300 mg Oral QHS   insulin aspart  0-9 Units Subcutaneous Q4H   midodrine  5 mg Oral TID WC   nadolol  20 mg Oral Daily   nicotine  14 mg Transdermal Daily   nystatin  1 Application Topical TID   pantoprazole (PROTONIX) IV  40 mg  Intravenous Q12H   rifaximin  550 mg Oral BID   sertraline  50 mg Oral Daily   sodium bicarbonate  650 mg Oral Daily   sodium zirconium cyclosilicate  10 g Oral Daily   sucralfate  1 g Oral TID WC & HS   thiamine  100 mg Oral Daily   Continuous Infusions:  octreotide (SANDOSTATIN) 500 mcg in sodium chloride 0.9 % 250 mL (2 mcg/mL) infusion 50 mcg/hr (05/22/23 0125)     LOS: 1 day   Hughie Closs, MD [facility]  05/22/2023, 8:21 AM   *Please note that this is a  verbal dictation therefore any spelling or grammatical errors are due to the "[employer]agon [employer] One" system interpretation.  Please page via Amion and do not message via secure chat for urgent patient care matters. Secure chat can be used for non urgent patient care matters.  How to contact the [facility] Attending or Consulting provider 7A - 7P or covering provider during after hours 7P -7A, for this patient?  Check the care team in [facility] and look for a) attending/consulting [facility] provider listed and b) the [facility] team listed. Page or secure chat 7A-7P. Log into [email or web address] and use [facility] universal password to access. If you do not have the password, please contact the hospital operator. Locate the [facility] provider you are looking for under [facility] and page to a number that you can be directly reached. If you still have difficulty reaching the provider, please page the [facility] (Director on Call) for the Hospitalists listed on amion for assistance.

## 2023-05-22 NOTE — Plan of Care (Signed)
  Problem: Skin Integrity: Goal: Risk for impaired skin integrity will decrease Outcome: Progressing   Problem: Tissue Perfusion: Goal: Adequacy of tissue perfusion will improve Outcome: Progressing   Problem: Clinical Measurements: Goal: Diagnostic test results will improve Outcome: Progressing   Problem: Activity: Goal: Risk for activity intolerance will decrease Outcome: Progressing   Problem: Nutrition: Goal: Adequate nutrition will be maintained Outcome: Progressing

## 2023-05-22 NOTE — Plan of Care (Signed)

## 2023-05-22 NOTE — Progress Notes (Signed)
Wilburn Cornelia 9:45 AM  Subjective: Patient seen and examined and familiar to me from previous hospital stays and case discussed with my partner Dr. Dulce Sellar and his hospital computer chart reviewed and he does have some upper abdominal pain but no blood in his ostomy no new complaints and he did take some Motrin at home which we discussed and we specifically said Tylenol and acetaminophen only and no more than 4-day and he is eating a soft diet without problems  Objective: Vital signs stable afebrile no acute distress a little upper pain no guarding or rebound BUN and creatinine increased hemoglobin decreased without signs of obvious bleeding probably from recent CT with contrast which was reviewed  Assessment: Multiple medical problems including colon cancer cirrhosis GI bleed etc. now with worsening renal failure  Plan: Agree with transfusion needs renal consult will check on tomorrow  [facility]  office [phone] After 5PM or if no answer call [phone]

## 2023-05-23 DIAGNOSIS — K2981 Duodenitis with bleeding: Secondary | ICD-10-CM | POA: Diagnosis not present

## 2023-05-23 LAB — GLUCOSE, CAPILLARY
Glucose-Capillary: 115 mg/dL — ABNORMAL HIGH (ref 70–99)
Glucose-Capillary: 132 mg/dL — ABNORMAL HIGH (ref 70–99)
Glucose-Capillary: 132 mg/dL — ABNORMAL HIGH (ref 70–99)
Glucose-Capillary: 149 mg/dL — ABNORMAL HIGH (ref 70–99)
Glucose-Capillary: 164 mg/dL — ABNORMAL HIGH (ref 70–99)
Glucose-Capillary: 173 mg/dL — ABNORMAL HIGH (ref 70–99)

## 2023-05-23 LAB — CBC WITH DIFFERENTIAL/PLATELET
Abs Immature Granulocytes: 0.04 10*3/uL (ref 0.00–0.07)
Basophils Absolute: 0 10*3/uL (ref 0.0–0.1)
Basophils Relative: 1 %
Eosinophils Absolute: 0.1 10*3/uL (ref 0.0–0.5)
Eosinophils Relative: 3 %
HCT: 20.8 % — ABNORMAL LOW (ref 39.0–52.0)
Hemoglobin: 7 g/dL — ABNORMAL LOW (ref 13.0–17.0)
Immature Granulocytes: 1 %
Lymphocytes Relative: 15 %
Lymphs Abs: 0.7 10*3/uL (ref 0.7–4.0)
MCH: 32.9 pg (ref 26.0–34.0)
MCHC: 33.7 g/dL (ref 30.0–36.0)
MCV: 97.7 fL (ref 80.0–100.0)
Monocytes Absolute: 0.6 10*3/uL (ref 0.1–1.0)
Monocytes Relative: 12 %
Neutro Abs: 3.1 10*3/uL (ref 1.7–7.7)
Neutrophils Relative %: 68 %
Platelets: 56 10*3/uL — ABNORMAL LOW (ref 150–400)
RBC: 2.13 MIL/uL — ABNORMAL LOW (ref 4.22–5.81)
RDW: 16.2 % — ABNORMAL HIGH (ref 11.5–15.5)
WBC: 4.5 10*3/uL (ref 4.0–10.5)
nRBC: 0 % (ref 0.0–0.2)

## 2023-05-23 LAB — BASIC METABOLIC PANEL
Anion gap: 7 (ref 5–15)
BUN: 81 mg/dL — ABNORMAL HIGH (ref 6–20)
CO2: 12 mmol/L — ABNORMAL LOW (ref 22–32)
Calcium: 7.8 mg/dL — ABNORMAL LOW (ref 8.9–10.3)
Chloride: 107 mmol/L (ref 98–111)
Creatinine, Ser: 4.83 mg/dL — ABNORMAL HIGH (ref 0.61–1.24)
GFR, Estimated: 13 mL/min — ABNORMAL LOW (ref >60)
Glucose, Bld: 161 mg/dL — ABNORMAL HIGH (ref 70–99)
Potassium: 5 mmol/L (ref 3.5–5.1)
Sodium: 126 mmol/L — ABNORMAL LOW (ref 135–145)

## 2023-05-23 LAB — CREATININE, URINE, RANDOM: Creatinine, Urine: 54 mg/dL

## 2023-05-23 LAB — SODIUM, URINE, RANDOM: Sodium, Ur: <10 mmol/L

## 2023-05-23 LAB — PREPARE RBC (CROSSMATCH)

## 2023-05-23 LAB — HEMOGLOBIN AND HEMATOCRIT, BLOOD
HCT: 24.6 % — ABNORMAL LOW (ref 39.0–52.0)
Hemoglobin: 7.9 g/dL — ABNORMAL LOW (ref 13.0–17.0)

## 2023-05-23 LAB — POTASSIUM: Potassium: 4.5 mmol/L (ref 3.5–5.1)

## 2023-05-23 MED ORDER — SODIUM CHLORIDE 0.9% IV SOLUTION
Freq: Once | INTRAVENOUS | Status: DC
Start: 2023-05-23 — End: 2023-05-26

## 2023-05-23 MED ORDER — SODIUM CHLORIDE 0.9% IV SOLUTION
Freq: Once | INTRAVENOUS | Status: AC
Start: 2023-05-23 — End: 2023-05-23

## 2023-05-23 MED ORDER — ALBUMIN HUMAN 25 % IV SOLN
50.0000 g | Freq: Every day | INTRAVENOUS | Status: AC
  Administered 2023-05-23 – 2023-05-25 (×3): 50 g via INTRAVENOUS
  Filled 2023-05-23 (×3): qty 200

## 2023-05-23 NOTE — Progress Notes (Signed)
PROGRESS NOTE    Martin Anthony  GLO:[id number] DOB: 1965-12-14 DOA: 05/20/2023 PCP: Nelwyn Salisbury, MD   Brief Narrative:  Martin Anthony is a 57 y.o. 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  EGD did also reveal a small esophageal varix as well but this wasn't bleeding and had no stigmata of recent bleed.   Last EtOH = Sept 2024.  Sober for 3 months now.   Pt presented to ED with recurrent diffuse abd pain, and recurrent dark blood with clots in ostomy bag 5 times today.  Symptoms are similar / the same as last week when he was admitted.    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 Has a small esophageal varix as well on that EGD that wasn't bleeding and no stigmata of recent bleed.  GI consulted, seen by Dr. Dulce Sellar 05/21/2023, he recommended continuing octreotide for 1-2 more days and then transitioning to oral propranolol 10 mg p.o. twice daily but did not recommend any intervention as inpatient and recommended follow-up with his primary GI as outpatient for surveillance colonoscopy and possible EGD.  Baseline hemoglobin around 8 however it dropped to 5.912 724, transfused 1 unit, improved to only 7.0.  Will transfuse another unit now.  Continue octreotide, PPI, rifaximin.  Will  defer to GI if intervention is needed.  Mild acute pancreatitis: Abdominal pain in combination with slightly elevated lipase level confirms diagnosis of mild pancreatitis.  Patient tolerating soft diet.  Will keep him on soft diet again today.   Type 2 diabetes mellitus with neurological complications (HCC) Previous history of diabetes, however hemoglobin A1c has remained either in prediabetes or normal range since last 4 years.  Hyperglycemic in 500s in ED with no anion gap. Got 10u novolog x1 in ED.  Blood sugar controlled, continue SSI.  Repeat hemoglobin A1c 6.2 this admission.   Alcoholic cirrhosis of liver with ascites (HCC) Pt with ESLD, last drink in Sept it seems.  Creatinine rising, continue to hold diuretics for now. Pt unable to obtain nadolol so kept taking coreg after discharge on 11/27.  Switched to nadolol upon admission.   Hypotension: No history of hypertension.  Hypertension ruled out. On midodrine, blood pressure on the low side but he is asymptomatic.   PAF (paroxysmal atrial fibrillation) (HCC) Chads Vasc of 4 but Has Bled score of > 3 = not candidate for [facility] Also having acute GIB today.   AKI on CKD stage IIIb: Baseline creatinine around 1.9, creatinine rose further to 4.83 today.  Will consult nephrology today.   Hyperkalemia: 6.1 yesterday, Lokelma increased to twice daily and dextrose with insulin given and potassium 5.0 now.  Continue to monitor.  Check potassium later today.  DVT prophylaxis: SCDs Start: 05/20/23 2118   Code Status:  Full Code  Family Communication:  None present at bedside.  Plan of care discussed with patient in length and he/she verbalized understanding and agreed with it.  Status is: Inpatient Remains inpatient appropriate because: Anemia, require blood transfusion.     Estimated body mass index is 21.3 kg/m as calculated from the following:   Height as of this encounter: 6\' 4"  (1.93 m).   Weight as of this encounter: 79.4 kg.     Nutritional Assessment: Body mass index is 21.3 kg/m.Marland Kitchen Seen by dietician.  I agree with the assessment and plan as outlined below: Nutrition Status:        . Skin Assessment: I have examined the patient's skin and I agree with the wound assessment as performed by the wound care RN as outlined below:    Consultants:  GI  Procedures:  As above  Antimicrobials:  Anti-infectives (From admission, onward)    Start     Dose/Rate Route Frequency Ordered Stop   05/20/23 2200  rifaximin (XIFAXAN) tablet 550 mg        550 mg Oral 2 times daily 05/20/23 2057           Subjective: Seen and examined.  Complains of chronic abdominal pain.  But tolerating diet.  Objective: Vitals:   05/22/23 2131 05/22/23 2156 05/23/23 0046 05/23/23 0432  BP: 111/60 (!) 104/56 112/65 114/64  Pulse: (!) 57 (!) 57 (!) 55 (!) 51  Resp: 20 16  18   Temp: 98.6 F (37 C) 98.3 F (36.8 C) 98.4 F (36.9 C) 98.2 F (36.8 C)  TempSrc: Oral Oral Oral Oral  SpO2: 100% 100% 100% 100%  Weight:      Height:        Intake/Output Summary (Last 24 hours) at 05/23/2023 0808 Last data filed at 05/23/2023 0046 Gross per 24 hour  Intake 1152 ml  Output --  Net 1152 ml    Filed Weights   05/20/23 1517  Weight: 79.4 kg    Examination:  General exam: Appears calm and comfortable  Respiratory system: Clear to auscultation. Respiratory effort normal. Cardiovascular system: S1 & S2 heard, RRR. No JVD, murmurs, rubs, gallops or clicks. No pedal edema. Gastrointestinal system: Abdomen is nondistended, soft and epigastric and right upper quadrant tenderness. No organomegaly or masses felt. Normal bowel sounds heard.  Ileostomy bag noted. Central nervous system: Alert and oriented. No focal neurological deficits. Extremities: Symmetric 5 x 5 power. Skin: No rashes, lesions or ulcers.  Psychiatry: Judgement and insight appear normal. Mood & affect appropriate.   Data Reviewed: I have personally reviewed  following labs and imaging studies  CBC: Recent Labs  Lab 05/20/23 1534 05/20/23 2330 05/21/23 0532 05/22/23 0733 05/22/23 1938 05/23/23 0243  WBC 4.0  --  6.2 6.4  --  4.5  NEUTROABS 3.0  --   --  4.4  --  3.1  HGB 8.3* 7.7* 7.4* 5.9* 6.6* 7.0*  HCT 25.7* 23.3* 22.5* 18.6* 20.2* 20.8*  MCV 99.2  --  97.8 101.1*  --  97.7  PLT 87*  --  91* 80*  --  56*   Basic Metabolic Panel: Recent Labs  Lab 05/20/23 1534 05/21/23 0532 05/22/23 0733 05/22/23 1408 05/23/23 0650  NA 128* 134* 131*  --  126*  K 5.2* 5.4* 6.1* 6.1* 5.0  CL 105 110 108  --  107  CO2 16* 18* 16*  --  12*  GLUCOSE 502* 155* 151*  --  161*  BUN 50* 51* 71*  --  81*  CREATININE 1.92* 2.32* 4.31*  --  4.83*  CALCIUM 8.7* 8.8* 7.9*  --  7.8*   GFR: Estimated Creatinine Clearance: 19.2 mL/min (A) (by C-G formula based on SCr of 4.83 mg/dL (H)). Liver Function Tests: Recent Labs  Lab 05/20/23 1534 05/21/23 0532  AST 40 38  ALT 18 18  ALKPHOS 113 100  BILITOT 2.7* 3.0*  PROT 6.6 6.7  ALBUMIN 2.3* 2.5*   Recent Labs  Lab 05/20/23 1534  LIPASE 438*   Recent Labs  Lab 05/20/23 2330  AMMONIA 34   Coagulation Profile: Recent Labs  Lab 05/20/23 1534  INR 1.7*   Cardiac Enzymes: No results for input(s): "CKTOTAL", "CKMB", "CKMBINDEX", "TROPONINI" in the last 168 hours. BNP (last 3 results) No results for input(s): "PROBNP" in the last 8760 hours. HbA1C: Recent Labs    05/21/23 0530  HGBA1C 6.2*   CBG: Recent Labs  Lab 05/22/23 1217 05/22/23 1607 05/22/23 1940 05/23/23 0010 05/23/23 0406  GLUCAP 168* 155* 153* 149* 164*   Lipid Profile: No results for input(s): "CHOL", "HDL", "LDLCALC", "TRIG", "CHOLHDL", "LDLDIRECT" in the last 72 hours. Thyroid Function Tests: No results for input(s): "TSH", "T4TOTAL", "FREET4", "T3FREE", "THYROIDAB" in the last 72 hours. Anemia Panel: No results for input(s): "VITAMINB12", "FOLATE", "FERRITIN", "TIBC", "IRON", "RETICCTPCT" in the last 72  hours. Sepsis Labs: No results for input(s): "PROCALCITON", "LATICACIDVEN" in the last 168 hours.  No results found for this or any previous visit (from the past 240 hour(s)).    Radiology Studies: No results found.  Scheduled Meds:  sodium chloride   Intravenous Once   sodium chloride   Intravenous Once   ferrous sulfate  325 mg Oral Q breakfast   folic acid  1 mg Oral Daily   gabapentin  100 mg Oral TID   gabapentin  300 mg Oral QHS   insulin aspart  0-9 Units Subcutaneous Q4H   midodrine  5 mg Oral TID WC   nadolol  20 mg Oral Daily   nicotine  14 mg Transdermal Daily   nystatin  1 Application Topical TID   pantoprazole (PROTONIX) IV  40 mg Intravenous Q12H   rifaximin  550 mg Oral BID   sertraline  50 mg Oral Daily   sodium bicarbonate  650 mg Oral Daily   sodium zirconium cyclosilicate  10 g Oral BID   sucralfate  1 g Oral TID WC & HS   thiamine  100 mg Oral Daily   Continuous Infusions:  octreotide (SANDOSTATIN) 500 mcg in sodium chloride 0.9 % 250 mL (2 mcg/mL) infusion 50 mcg/hr (05/22/23 2200)     LOS: 2 days   Hughie Closs, MD [facility]  05/23/2023, 8:08 AM   *Please note that this is a verbal dictation therefore any spelling or grammatical errors are due to the "Dragon Medical One" system interpretation.  Please page via Amion and do not message via secure chat for urgent patient care matters. Secure chat can be used for non urgent patient care matters.  How to contact the [facility] Attending or Consulting provider 7A - 7P or covering provider during after hours 7P -7A, for this patient?  Check the care team in [facility] and look for a) attending/consulting [facility] provider listed and b) the [facility] team listed. Page or secure chat 7A-7P. Log into [email or web address] and use [place]'s universal password to access. If you do not have the password, please contact the hospital operator. Locate the [facility] provider you are looking for under [facility] and page to a number  that you can be directly reached. If you still have difficulty reaching the provider, please page the [facility] (Director on Call) for the Hospitalists listed on amion for assistance.

## 2023-05-23 NOTE — Plan of Care (Signed)
  Problem: Education: Goal: Knowledge of General Education information will improve Description: Including pain rating scale, medication(s)/side effects and non-pharmacologic comfort measures Outcome: Progressing   Problem: Clinical Measurements: Goal: Will remain free from infection Outcome: Progressing   Problem: Coping: Goal: Level of anxiety will decrease Outcome: Progressing   Problem: Pain Management: Goal: General experience of comfort will improve Outcome: Progressing   Problem: Safety: Goal: Ability to remain free from injury will improve Outcome: Progressing

## 2023-05-23 NOTE — Progress Notes (Signed)
Martin Anthony 9:02 AM  Subjective: Patient without any further bleeding still with some pain no new complaints tolerating diet and we discussed the difficulties with liver transplant including only sobriety for 3 to 4 months and colon cancer surgery in 23  Objective: Vital signs stable afebrile no acute distress lower abdomen is soft nontender seems to be just tender over the ostomy BUN and creatinine increased hemoglobin increased after transfusion white count okay platelets slight decrease  Assessment: Cirrhosis now with worsening renal failure resolved GI blood loss  Plan: Patient probably needs renal consult will get a urinary sodium to determine whether this is hepatorenal versus ATN from his CT probably and okay to stop octreotide unless renal feels we should continue it and if his kidneys improve a TIPS would possibly help him decrease portal pressure and have much less bleeding or at least a trial of beta-blockers in the future to decrease portal pressure will asked rounding partner to check on tomorrow  [facility]  office [phone] After 5PM or if no answer call [phone]

## 2023-05-23 NOTE — Progress Notes (Signed)
Received report from L.Nelson RN. No change in assessment. Will continue plan of care. Martin Anthony, Yancey Flemings, RN

## 2023-05-23 NOTE — Consult Note (Signed)
Reason for Consult: Acute kidney injury on chronic kidney disease Referring Physician: Hughie Closs MD [facility])  HPI:  57 year old man with past medical history significant for hypertension, depression, atrial fibrillation, gout, dyslipidemia, gastroesophageal reflux disease, colon cancer status post right hemicolectomy with ileostomy, alcoholic cirrhosis with portal hypertension and chronic kidney disease stage IIIb/IV with baseline creatinine ranging widely from 1.6-2.4.  He was admitted through the emergency room 3 days ago with recurrent diffuse abdominal pain and hematochezia in his ostomy bag.  Symptomatically felt to be similar to his recent hospitalization from duodenitis associated with gastric antral vascular ectasia seen on EGD 05/12/2023.  Concern is raised on his labs with worsening renal function with creatinine having risen from 1.9 on admission to 4.3 yesterday and 4.8 today along with hyperkalemia, hyponatremia and anion gap metabolic acidosis.  He has had episodes of relative hypotension over the past 48 to 72 hours as well as CT angiogram of the abdomen and pelvis on 05/20/2023.  Output is not accurately charted and he has been on midodrine and octreotide.  He was seen by Dr. Florentina Jenny of [facility] nephrology in [place] for chronic kidney disease in January of this year and suspected to have had an element of superimposed intravascular contraction to underlying chronic kidney disease that is largely from residual injury after suffering dialysis dependent acute kidney injury and hepatorenal physiology.  Past Medical History:  Diagnosis Date   Anxiety    Ascites due to alcoholic cirrhosis (HCC)    Cirrhosis with alcoholism (HCC)    Depression    Diabetes mellitus without complication (HCC)    GERD (gastroesophageal reflux disease)    Gout    Hyperlipidemia    Hypertension    Pancreatitis    Primary adenocarcinoma of ascending colon (HCC)    Substance abuse (HCC)    alcoholism     Type 2 diabetes mellitus with diabetic neuropathy [facility])     Past Surgical History:  Procedure Laterality Date   BIOPSY  01/19/2019   Procedure: BIOPSY;  Surgeon: Jeani Hawking, MD;  Location: [facility];  Service: Endoscopy;;   BIOPSY  05/09/2023   Procedure: BIOPSY;  Surgeon: Kathi Der, MD;  Location: [facility];  Service: Gastroenterology;;   COLON SURGERY  10/25/2021   right hemicolectomy with ostomy at [facility] in [place]   COLONOSCOPY N/A 01/19/2019   Procedure: COLONOSCOPY;  Surgeon: Jeani Hawking, MD;  Location: [facility];  Service: Endoscopy;  Laterality: N/A;   ESOPHAGEAL BANDING  03/25/2023   Procedure: ESOPHAGEAL BANDING;  Surgeon: Kerin Salen, MD;  Location: [facility];  Service: Gastroenterology;;   ESOPHAGOGASTRODUODENOSCOPY (EGD) WITH PROPOFOL N/A 01/19/2019   Procedure: ESOPHAGOGASTRODUODENOSCOPY (EGD) WITH PROPOFOL;  Surgeon: Jeani Hawking, MD;  Location: [facility];  Service: Endoscopy;  Laterality: N/A;   ESOPHAGOGASTRODUODENOSCOPY (EGD) WITH PROPOFOL N/A 03/25/2023   Procedure: ESOPHAGOGASTRODUODENOSCOPY (EGD) WITH PROPOFOL;  Surgeon: Kerin Salen, MD;  Location: [facility];  Service: Gastroenterology;  Laterality: N/A;   ESOPHAGOGASTRODUODENOSCOPY (EGD) WITH PROPOFOL N/A 05/09/2023   Procedure: ESOPHAGOGASTRODUODENOSCOPY (EGD) WITH PROPOFOL;  Surgeon: Kathi Der, MD;  Location: [facility];  Service: Gastroenterology;  Laterality: N/A;   HEMOSTASIS CLIP PLACEMENT  01/19/2019   Procedure: HEMOSTASIS CLIP PLACEMENT;  Surgeon: Jeani Hawking, MD;  Location: [facility];  Service: Endoscopy;;   HERNIA REPAIR     umbilical    ILEOSCOPY N/A 03/25/2023   Procedure: ILEOSCOPY THROUGH STOMA;  Surgeon: Kerin Salen, MD;  Location: [facility];  Service: Gastroenterology;  Laterality: N/A;   INGUINAL HERNIA REPAIR Bilateral  IR FLUORO GUIDE CV LINE RIGHT  05/13/2021   IR PARACENTESIS  05/19/2021   IR US GUIDE VASC ACCESS RIGHT  05/13/2021    POLYPECTOMY  01/19/2019   Procedure: POLYPECTOMY;  Surgeon: Jeani Hawking, MD;  Location: [facility];  Service: Endoscopy;;    Family History  Problem Relation Age of Onset   Hypertension Mother    Diabetes Father    Heart attack Father    Pneumonia Father    Hypertension Father    Diabetes Maternal Grandfather     Social History:  reports that he quit smoking about 21 years ago. His smoking use included cigarettes. He has never used smokeless tobacco. He reports current alcohol use of about 2.0 standard drinks of alcohol per week. He reports that he does not use drugs.  Allergies:  Allergies  Allergen Reactions   Acetaminophen Other (See Comments)    Hx of advanced cirrhosis/liver disease   Ibuprofen Other (See Comments)    Hx of advanced cirrhosis/liver disease     Medications: I have reviewed the patient's current medications. Scheduled:  sodium chloride   Intravenous Once   sodium chloride   Intravenous Once   ferrous sulfate  325 mg Oral Q breakfast   folic acid  1 mg Oral Daily   gabapentin  100 mg Oral TID   gabapentin  300 mg Oral QHS   insulin aspart  0-9 Units Subcutaneous Q4H   midodrine  5 mg Oral TID WC   nadolol  20 mg Oral Daily   nicotine  14 mg Transdermal Daily   nystatin  1 Application Topical TID   pantoprazole (PROTONIX) IV  40 mg Intravenous Q12H   rifaximin  550 mg Oral BID   sertraline  50 mg Oral Daily   sodium bicarbonate  650 mg Oral Daily   sodium zirconium cyclosilicate  10 g Oral BID   sucralfate  1 g Oral TID WC & HS   thiamine  100 mg Oral Daily   Continuous:  octreotide (SANDOSTATIN) 500 mcg in sodium chloride 0.9 % 250 mL (2 mcg/mL) infusion 50 mcg/hr (05/23/23 0856)       Latest Ref Rng & Units 05/23/2023    6:50 AM 05/22/2023    2:08 PM 05/22/2023    7:33 AM  BMP  Glucose 70 - 99 mg/dL 782   956   BUN 6 - 20 mg/dL 81   71   Creatinine 2.13 - 1.24 mg/dL 0.86   5.78   Sodium 469 - 145 mmol/L 126   131   Potassium 3.5 - 5.1  mmol/L 5.0  6.1  6.1   Chloride 98 - 111 mmol/L 107   108   CO2 22 - 32 mmol/L 12   16   Calcium 8.9 - 10.3 mg/dL 7.8   7.9       Latest Ref Rng & Units 05/23/2023    2:28 PM 05/23/2023    2:43 AM 05/22/2023    7:38 PM  CBC  WBC 4.0 - 10.5 K/uL  4.5    Hemoglobin 13.0 - 17.0 g/dL 7.9  7.0  6.6   Hematocrit 39.0 - 52.0 % 24.6  20.8  20.2   Platelets 150 - 400 K/uL  56     Urinalysis    Component Value Date/Time   COLORURINE YELLOW 05/20/2023 2022   APPEARANCEUR CLEAR 05/20/2023 2022   LABSPEC 1.035 (H) 05/20/2023 2022   PHURINE 5.0 05/20/2023 2022   GLUCOSEU >=500 (A) 05/20/2023 2022  HGBUR NEGATIVE 05/20/2023 2022   BILIRUBINUR NEGATIVE 05/20/2023 2022   BILIRUBINUR 2+ 05/17/2015 1013   KETONESUR NEGATIVE 05/20/2023 2022   PROTEINUR NEGATIVE 05/20/2023 2022   UROBILINOGEN 0.2 05/17/2015 1013   NITRITE NEGATIVE 05/20/2023 2022   LEUKOCYTESUR NEGATIVE 05/20/2023 2022      No results found.  Review of Systems  Constitutional:  Positive for appetite change and fatigue. Negative for chills and fever.  HENT:  Negative for nosebleeds and trouble swallowing.   Eyes:  Negative for redness and visual disturbance.  Respiratory:  Negative for chest tightness and shortness of breath.   Cardiovascular:  Negative for chest pain and leg swelling.  Gastrointestinal:  Positive for abdominal pain and blood in stool.  Genitourinary:  Negative for dysuria, hematuria and urgency.  Musculoskeletal:  Negative for arthralgias and myalgias.  Skin:  Negative for wound.  Neurological:  Positive for weakness. Negative for light-headedness and headaches.   Blood pressure (!) 112/56, pulse (!) 48, temperature 98.4 F (36.9 C), temperature source Oral, resp. rate 15, height 6\' 4"  (1.93 m), weight 79.4 kg, SpO2 100%. Physical Exam Vitals reviewed.  Constitutional:      Appearance: He is normal weight. He is ill-appearing.  HENT:     Head: Normocephalic and atraumatic.     Right Ear: External  ear normal.     Left Ear: External ear normal.     Nose: Nose normal.     Mouth/Throat:     Mouth: Mucous membranes are dry.     Pharynx: Oropharynx is clear.  Eyes:     Extraocular Movements: Extraocular movements intact.     Conjunctiva/sclera: Conjunctivae normal.  Cardiovascular:     Rate and Rhythm: Regular rhythm. Bradycardia present.     Pulses: Normal pulses.     Heart sounds: Normal heart sounds.  Pulmonary:     Effort: Pulmonary effort is normal.     Breath sounds: Normal breath sounds.  Abdominal:     General: Bowel sounds are normal.     Palpations: Abdomen is soft.     Comments: Midline ileostomy bag in situ  Musculoskeletal:        General: Normal range of motion.     Cervical back: Normal range of motion and neck supple.     Right lower leg: No edema.     Left lower leg: No edema.  Skin:    General: Skin is warm and dry.     Coloration: Skin is pale.  Neurological:     Mental Status: He is alert and oriented to person, place, and time.     Assessment/Plan: 1.  Acute kidney injury on chronic kidney disease stage IV: He appears to have had multifactorial acute kidney injury with hemodynamic injury from relative hypotension and contrast-induced injury in that setting.  There is the likelihood that this might also be HRS 1 and urine electrolytes have been requested.  He is on midodrine and octreotide and I will begin him on intravenous albumin.  Would not consider Terlipressin unless there is unequivocal evidence of HRS.  He does not have any acute lab or clinical indication for dialysis at this time.  Continue to avoid iodinated contrast and NSAIDs.  Hold diuretics. 2.  Hyponatremia: Secondary to acute kidney injury and impaired free water handling.  Patient with underlying portal hypertension and inappropriate ADH activated state.  Fluid restriction. 3.  Acute blood loss anemia: Secondary to GI bleed associated with duodenitis, improved status post PRBC transfusion.   No  indications noted for repeat endoscopy per GI notes. 4.  Alcoholic cirrhosis: Reports to be sober since September of this year.  Unfortunately not a candidate for liver transplant evaluation at this time.  Dagoberto Ligas 05/23/2023, 3:30 PM

## 2023-05-24 DIAGNOSIS — K2981 Duodenitis with bleeding: Secondary | ICD-10-CM | POA: Diagnosis not present

## 2023-05-24 LAB — RENAL FUNCTION PANEL
Albumin: 2.6 g/dL — ABNORMAL LOW (ref 3.5–5.0)
Anion gap: 7 (ref 5–15)
BUN: 78 mg/dL — ABNORMAL HIGH (ref 6–20)
CO2: 13 mmol/L — ABNORMAL LOW (ref 22–32)
Calcium: 7.8 mg/dL — ABNORMAL LOW (ref 8.9–10.3)
Chloride: 107 mmol/L (ref 98–111)
Creatinine, Ser: 4.43 mg/dL — ABNORMAL HIGH (ref 0.61–1.24)
GFR, Estimated: 15 mL/min — ABNORMAL LOW (ref >60)
Glucose, Bld: 131 mg/dL — ABNORMAL HIGH (ref 70–99)
Phosphorus: 5.9 mg/dL — ABNORMAL HIGH (ref 2.5–4.6)
Potassium: 5.3 mmol/L — ABNORMAL HIGH (ref 3.5–5.1)
Sodium: 127 mmol/L — ABNORMAL LOW (ref 135–145)

## 2023-05-24 LAB — TYPE AND SCREEN
ABO/RH(D): O POS
ABO/RH(D): O POS
Antibody Screen: NEGATIVE
Antibody Screen: NEGATIVE
Unit division: 0
Unit division: 0
Unit division: 0

## 2023-05-24 LAB — GLUCOSE, CAPILLARY
Glucose-Capillary: 101 mg/dL — ABNORMAL HIGH (ref 70–99)
Glucose-Capillary: 112 mg/dL — ABNORMAL HIGH (ref 70–99)
Glucose-Capillary: 126 mg/dL — ABNORMAL HIGH (ref 70–99)
Glucose-Capillary: 129 mg/dL — ABNORMAL HIGH (ref 70–99)
Glucose-Capillary: 133 mg/dL — ABNORMAL HIGH (ref 70–99)
Glucose-Capillary: 137 mg/dL — ABNORMAL HIGH (ref 70–99)
Glucose-Capillary: 174 mg/dL — ABNORMAL HIGH (ref 70–99)

## 2023-05-24 LAB — BPAM RBC
Blood Product Expiration Date: 202412312359
Blood Product Expiration Date: 202501032359
Blood Product Expiration Date: 202501032359
ISSUE DATE / TIME: 202412071600
ISSUE DATE / TIME: 202412072131
ISSUE DATE / TIME: 202412080849
Unit Type and Rh: 5100
Unit Type and Rh: 5100
Unit Type and Rh: 5100

## 2023-05-24 LAB — CBC WITH DIFFERENTIAL/PLATELET
Abs Immature Granulocytes: 0.03 10*3/uL (ref 0.00–0.07)
Basophils Absolute: 0 10*3/uL (ref 0.0–0.1)
Basophils Relative: 1 %
Eosinophils Absolute: 0.1 10*3/uL (ref 0.0–0.5)
Eosinophils Relative: 4 %
HCT: 23.1 % — ABNORMAL LOW (ref 39.0–52.0)
Hemoglobin: 7.9 g/dL — ABNORMAL LOW (ref 13.0–17.0)
Immature Granulocytes: 1 %
Lymphocytes Relative: 19 %
Lymphs Abs: 0.4 10*3/uL — ABNORMAL LOW (ref 0.7–4.0)
MCH: 32.5 pg (ref 26.0–34.0)
MCHC: 34.2 g/dL (ref 30.0–36.0)
MCV: 95.1 fL (ref 80.0–100.0)
Monocytes Absolute: 0.3 10*3/uL (ref 0.1–1.0)
Monocytes Relative: 15 %
Neutro Abs: 1.4 10*3/uL — ABNORMAL LOW (ref 1.7–7.7)
Neutrophils Relative %: 60 %
Platelets: 55 10*3/uL — ABNORMAL LOW (ref 150–400)
RBC: 2.43 MIL/uL — ABNORMAL LOW (ref 4.22–5.81)
RDW: 15.7 % — ABNORMAL HIGH (ref 11.5–15.5)
WBC: 2.2 10*3/uL — ABNORMAL LOW (ref 4.0–10.5)
nRBC: 0 % (ref 0.0–0.2)

## 2023-05-24 LAB — POTASSIUM: Potassium: 5.4 mmol/L — ABNORMAL HIGH (ref 3.5–5.1)

## 2023-05-24 MED ORDER — SODIUM ZIRCONIUM CYCLOSILICATE 10 G PO PACK
10.0000 g | PACK | Freq: Three times a day (TID) | ORAL | Status: AC
  Administered 2023-05-24 (×2): 10 g via ORAL
  Filled 2023-05-24 (×2): qty 1

## 2023-05-24 MED ORDER — SODIUM ZIRCONIUM CYCLOSILICATE 10 G PO PACK
10.0000 g | PACK | Freq: Two times a day (BID) | ORAL | Status: DC
  Administered 2023-05-24: 10 g via ORAL
  Filled 2023-05-24: qty 1

## 2023-05-24 MED ORDER — ORAL CARE MOUTH RINSE: 15.0000 mL | OROMUCOSAL | Status: DC | PRN

## 2023-05-24 NOTE — Consult Note (Signed)
WOC Nurse ostomy consult note Stoma type/location: ileostomy since 2023; [facility]  Ostomy pouching: 1pc.2" convex/barrier rings. Using no-sting barrier wipes, adhesive remover, and antifungal powder.  Staff need guidance for supplies Discussed with patient supplies ordered, patient independent with ostomy care  [place]#  [phone] 1pc 2" convex/ [place] # [phone] 2" barrier ring/ [place] # [phone] no-sting barrier wipes/[place] # [id number] adhesive wipes.         Gedalya Jim [facility], CNS, [employer] [phone]

## 2023-05-24 NOTE — Plan of Care (Signed)

## 2023-05-24 NOTE — Progress Notes (Signed)
[facility] Progress Note  Subjective: seen in room, eating regular food. No c/o's.   Vitals:   05/23/23 1212 05/23/23 1949 05/24/23 0356 05/24/23 1255  BP: (!) 112/56 116/66 (!) 101/59 105/72  Pulse: (!) 48 (!) 47 (!) 49 (!) 48  Resp: 15 16 20 18   Temp: 98.4 F (36.9 C) 98.3 F (36.8 C) 98.5 F (36.9 C) 98.1 F (36.7 C)  TempSrc: Oral Oral Oral Oral  SpO2: 100% 100% 100% 99%  Weight:      Height:        Exam:   alert, nad   no jvd  Chest cta bilat  Cor reg no RG  Abd soft ntnd no ascites   Ext no LE edema   Alert, NF, ox3      UA prot net, 0-5 rbc/ wbc/ epi    UNa < 10, UCr 54     Date   Creat  eGFR   2010- 2020  0.80- 1.20 > 60   Jan 2022  0.66   Nov 2022  4.56 >> 2.70 AKI on ckd   12/05/21  2.00   01/20/23  1.35   02/13/23  1.31   10/08- 04/07/23 1.63- 2.56    11/18- 05/12/23 1.87- 2.14 33- 42 ml/min, CKD 3b    12/06  2.32    12/07  4.31     12/08  4.83     12/09  4.43       Assessment/ Plan:  AKI on CKD 3b: b/l creatnine 1.8- 2.1 from nov 2024, eGFR 33-42 ml/min. Creat 2.3 here on admit in the setting of acute upper GIB w/ hypotension, also CT scan w/ contrast done early. AKI appears to be multifactorial with hemodynamic injury from relative hypotension and contrast-induced injury. There is the likelihood of HRS also given low UNa. He was on midodrine and octreotide and we added IV albumin on 12/08.  He does not have indication for dialysis at this time.  Continue to avoid iodinated contrast and NSAIDs. Will dc beta blocker given need for higher BP's w/ HRS.  Creat down today, looks to be recovering.  Will follow.   Hyponatremia: Secondary to acute kidney injury and impaired free water handling.  Patient with underlying portal hypertension and inappropriate ADH activated state.  Fluid restriction.  Hyperkalemia - K+ 5.4, start lokelma and renal diet.   Acute blood loss anemia: Secondary to GI bleed associated with duodenitis, improved status  post PRBC transfusion.  No indications noted for repeat endoscopy per GI notes.  Alcoholic cirrhosis: Reports to be sober since September of this year.  Unfortunately not a candidate for liver transplant evaluation at this time.   Vinson Moselle MD  [facility] 05/24/2023, 3:19 PM  Recent Labs  Lab 05/21/23 0532 05/22/23 0733 05/23/23 0650 05/23/23 1428 05/24/23 0442 05/24/23 0857 05/24/23 1419  HGB 7.4*   < >  --  7.9*  --  7.9*  --   ALBUMIN 2.5*  --   --   --  2.6*  --   --   CALCIUM 8.8*   < > 7.8*  --  7.8*  --   --   PHOS  --   --   --   --  5.9*  --   --   CREATININE 2.32*   < > 4.83*  --  4.43*  --   --   K 5.4*   < > 5.0 4.5 5.3*  --  5.4*   < > =  values in this interval not displayed.   No results for input(s): "IRON", "TIBC", "FERRITIN" in the last 168 hours. Inpatient medications:  sodium chloride   Intravenous Once   ferrous sulfate  325 mg Oral Q breakfast   folic acid  1 mg Oral Daily   gabapentin  100 mg Oral TID   gabapentin  300 mg Oral QHS   insulin aspart  0-9 Units Subcutaneous Q4H   midodrine  5 mg Oral TID WC   nadolol  20 mg Oral Daily   nicotine  14 mg Transdermal Daily   nystatin  1 Application Topical TID   pantoprazole (PROTONIX) IV  40 mg Intravenous Q12H   rifaximin  550 mg Oral BID   sertraline  50 mg Oral Daily   sodium bicarbonate  650 mg Oral Daily   sodium zirconium cyclosilicate  10 g Oral BID   sucralfate  1 g Oral TID WC & HS   thiamine  100 mg Oral Daily    albumin human 50 g (05/24/23 1014)   octreotide (SANDOSTATIN) 500 mcg in sodium chloride 0.9 % 250 mL (2 mcg/mL) infusion 50 mcg/hr (05/24/23 0518)   morphine injection, ondansetron **OR** ondansetron (ZOFRAN) IV, mouth rinse

## 2023-05-24 NOTE — Plan of Care (Signed)
  Problem: Health Behavior/Discharge Planning: Goal: Ability to manage health-related needs will improve Outcome: Progressing   Problem: Metabolic: Goal: Ability to maintain appropriate glucose levels will improve Outcome: Progressing   Problem: Nutritional: Goal: Maintenance of adequate nutrition will improve Outcome: Progressing   Problem: Education: Goal: Knowledge of General Education information will improve Description: Including pain rating scale, medication(s)/side effects and non-pharmacologic comfort measures Outcome: Progressing   Problem: Pain Management: Goal: General experience of comfort will improve Outcome: Progressing   Problem: Safety: Goal: Ability to remain free from injury will improve Outcome: Progressing

## 2023-05-24 NOTE — Progress Notes (Signed)
[facility] Progress Note  Martin Anthony 58 y.o. 08/02/1965   Subjective: Complaining of right sided abdominal pain and gurgling.   Objective: Vital signs: Vitals:   05/24/23 0356 05/24/23 1255  BP: (!) 101/59 105/72  Pulse: (!) 49 (!) 48  Resp: 20 18  Temp: 98.5 F (36.9 C) 98.1 F (36.7 C)  SpO2: 100% 99%    Physical Exam: Gen: disheveled, lethargic, thin, no acute distress  HEENT: anicteric sclera CV: RRR Chest: CTA B Abd: ostomy bag intact with binder, right-sided tenderness with guarding, soft, nondistended Ext: no edema  Lab Results: Recent Labs    05/23/23 0650 05/23/23 1428 05/24/23 0442 05/24/23 1419  NA 126*  --  127*  --   K 5.0   < > 5.3* 5.4*  CL 107  --  107  --   CO2 12*  --  13*  --   GLUCOSE 161*  --  131*  --   BUN 81*  --  78*  --   CREATININE 4.83*  --  4.43*  --   CALCIUM 7.8*  --  7.8*  --   PHOS  --   --  5.9*  --    < > = values in this interval not displayed.   Recent Labs    05/24/23 0442  ALBUMIN 2.6*   Recent Labs    05/23/23 0243 05/23/23 1428 05/24/23 0857  WBC 4.5  --  2.2*  NEUTROABS 3.1  --  1.4*  HGB 7.0* 7.9* 7.9*  HCT 20.8* 24.6* 23.1*  MCV 97.7  --  95.1  PLT 56*  --  55*      Assessment/Plan: Cirrhosis s/p GI bleed that has resolved. AKI seen by nephrology. Continue supportive care. No new GI recs.   Shirley Friar 05/24/2023, 4:22 PM  Questions please call [phone] ID: Martin Anthony, male   DOB: Jun 13, 1966, 57 y.o.   MRN: [id number]

## 2023-05-24 NOTE — Progress Notes (Signed)
PROGRESS NOTE    [name]  XBM:[id number] DOB: 31-Mar-1966 DOA: 05/20/2023 PCP: Nelwyn Salisbury, MD   Brief Narrative:  Martin Anthony is a 57 y.o. 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  EGD did also reveal a small esophageal varix as well but this wasn't bleeding and had no stigmata of recent bleed.   Last EtOH = Sept 2024.  Sober for 3 months now.   Pt presented to ED with recurrent diffuse abd pain, and recurrent dark blood with clots in ostomy bag 5 times today.  Symptoms are similar / the same as last week when he was admitted.    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 Has a small esophageal varix as well on that EGD that wasn't bleeding and no stigmata of recent bleed.  GI consulted, seen by Dr. Dulce Sellar 05/21/2023, he recommended continuing octreotide for 1-2 more days and then transitioning to oral propranolol 10 mg p.o. twice daily but did not recommend any intervention as inpatient and recommended follow-up with his primary GI as outpatient for surveillance colonoscopy and possible EGD.  Baseline hemoglobin around 8 however it dropped to 5.9 on 05/22/23, transfused 1 unit, hemoglobin 7.9 today.  Continue octreotide, PPI, rifaximin.  Will defer to GI if intervention  is needed.  Mild acute pancreatitis: Abdominal pain in combination with slightly elevated lipase level confirms diagnosis of mild pancreatitis.  Patient tolerating soft diet.  Will keep him on soft diet again today.   Type 2 diabetes mellitus with neurological complications (HCC) Previous history of diabetes, however hemoglobin A1c has remained either in prediabetes or normal range since last 4 years.  Hyperglycemic in 500s in ED with no anion gap. Got 10u novolog x1 in ED.  Blood sugar controlled, continue SSI.  Repeat hemoglobin A1c 6.2 this admission.   Alcoholic cirrhosis of liver with ascites (HCC) Pt with ESLD, last drink in Sept it seems.  Creatinine rising, continue to hold diuretics for now. Pt unable to obtain nadolol so kept taking coreg after discharge on 11/27.  Switched to nadolol upon admission.   Hypotension: No history of hypertension.  Hypertension ruled out. On midodrine, blood pressure on the low side but he is asymptomatic.   PAF (paroxysmal atrial fibrillation) (HCC) Chads Vasc of 4 but Has Bled score of > 3 = not candidate for [facility] Also having acute GIB today.   AKI on CKD stage IIIb: Baseline creatinine around 1.9, creatinine rose further to 4.83 today.  Multifactorial but most likely due to hepatorenal syndrome.  Seen by nephrology, started on albumin, creatinine has started to improve and down to 4.4 today.  Appreciate nephrology help.     Hyperkalemia: Slightly up to 5.3 today.  Will resume Lokelma.  DVT prophylaxis: SCDs Start: 05/20/23  2118   Code Status: Full Code  Family Communication:  None present at bedside.  Plan of care discussed with patient in length and he/she verbalized understanding and agreed with it.  Status is: Inpatient Remains inpatient appropriate because: Anemia, require blood transfusion.     Estimated body mass index is 21.3 kg/m as calculated from the following:   Height as of this encounter: 6\' 4"  (1.93 m).   Weight as of this  encounter: 79.4 kg.    Nutritional Assessment: Body mass index is 21.3 kg/m.Marland Kitchen Seen by dietician.  I agree with the assessment and plan as outlined below: Nutrition Status:        . Skin Assessment: I have examined the patient's skin and I agree with the wound assessment as performed by the wound care RN as outlined below:    Consultants:  GI  Procedures:  As above  Antimicrobials:  Anti-infectives (From admission, onward)    Start     Dose/Rate Route Frequency Ordered Stop   05/20/23 2200  rifaximin (XIFAXAN) tablet 550 mg        550 mg Oral 2 times daily 05/20/23 2057           Subjective: Patient seen and examined.  He says that he is feeling better than yesterday but he still has abdominal pain but tolerable.  No other complaint.  Objective: Vitals:   05/23/23 0915 05/23/23 1212 05/23/23 1949 05/24/23 0356  BP: (!) 107/59 (!) 112/56 116/66 (!) 101/59  Pulse:  (!) 48 (!) 47 (!) 49  Resp: 18 15 16 20   Temp: 98.3 F (36.8 C) 98.4 F (36.9 C) 98.3 F (36.8 C) 98.5 F (36.9 C)  TempSrc: Oral Oral Oral Oral  SpO2: 100% 100% 100% 100%  Weight:      Height:        Intake/Output Summary (Last 24 hours) at 05/24/2023 0959 Last data filed at 05/24/2023 0300 Gross per 24 hour  Intake 1218.71 ml  Output --  Net 1218.71 ml    Filed Weights   05/20/23 1517  Weight: 79.4 kg    Examination:  General exam: Appears calm and comfortable  Respiratory system: Clear to auscultation. Respiratory effort normal. Cardiovascular system: S1 & S2 heard, RRR. No JVD, murmurs, rubs, gallops or clicks. No pedal edema. Gastrointestinal system: Abdomen is nondistended, soft and epigastric and right upper quadrant tenderness. No organomegaly or masses felt. Normal bowel sounds heard.  Ileostomy bag noted. Central nervous system: Alert and oriented. No focal neurological deficits. Extremities: Symmetric 5 x 5 power. Skin: No rashes, lesions or ulcers.  Psychiatry: Judgement  and insight appear normal. Mood & affect appropriate.   Data Reviewed: I have personally reviewed following labs and imaging studies  CBC: Recent Labs  Lab 05/20/23 1534 05/20/23 2330 05/21/23 0532 05/22/23 0733 05/22/23 1938 05/23/23 0243 05/23/23 1428 05/24/23 0857  WBC 4.0  --  6.2 6.4  --  4.5  --  2.2*  NEUTROABS 3.0  --   --  4.4  --  3.1  --  1.4*  HGB 8.3*   < > 7.4* 5.9* 6.6* 7.0* 7.9* 7.9*  HCT 25.7*   < > 22.5* 18.6* 20.2* 20.8* 24.6* 23.1*  MCV 99.2  --  97.8 101.1*  --  97.7  --  95.1  PLT 87*  --  91* 80*  --  56*  --  55*   < > = values in this interval not displayed.   Basic Metabolic Panel: Recent Labs  Lab 05/20/23 1534 05/21/23 0532 05/22/23 0733 05/22/23 1408 05/23/23 0650 05/23/23 1428 05/24/23 0442  NA 128* 134* 131*  --  126*  --  127*  K 5.2* 5.4* 6.1* 6.1* 5.0 4.5 5.3*  CL 105 110 108  --  107  --  107  CO2 16* 18* 16*  --  12*  --  13*  GLUCOSE 502* 155* 151*  --  161*  --  131*  BUN 50* 51* 71*  --  81*  --  78*  CREATININE 1.92* 2.32* 4.31*  --  4.83*  --  4.43*  CALCIUM 8.7* 8.8* 7.9*  --  7.8*  --  7.8*  PHOS  --   --   --   --   --   --  5.9*   GFR: Estimated Creatinine Clearance: 20.9 mL/min (A) (by C-G formula based on SCr of 4.43 mg/dL (H)). Liver Function Tests: Recent Labs  Lab 05/20/23 1534 05/21/23 0532 05/24/23 0442  AST 40 38  --   ALT 18 18  --   ALKPHOS 113 100  --   BILITOT 2.7* 3.0*  --   PROT 6.6 6.7  --   ALBUMIN 2.3* 2.5* 2.6*   Recent Labs  Lab 05/20/23 1534  LIPASE 438*   Recent Labs  Lab 05/20/23 2330  AMMONIA 34   Coagulation Profile: Recent Labs  Lab 05/20/23 1534  INR 1.7*   Cardiac Enzymes: No results for input(s): "CKTOTAL", "CKMB", "CKMBINDEX", "TROPONINI" in the last 168 hours. BNP (last 3 results) No results for input(s): "PROBNP" in the last 8760 hours. HbA1C: No results for input(s): "HGBA1C" in the last 72 hours.  CBG: Recent Labs  Lab 05/23/23 1640 05/23/23 1947  05/23/23 2356 05/24/23 0354 05/24/23 0745  GLUCAP 137* 115* 132* 101* 112*   Lipid Profile: No results for input(s): "CHOL", "HDL", "LDLCALC", "TRIG", "CHOLHDL", "LDLDIRECT" in the last 72 hours. Thyroid Function Tests: No results for input(s): "TSH", "T4TOTAL", "FREET4", "T3FREE", "THYROIDAB" in the last 72 hours. Anemia Panel: No results for input(s): "VITAMINB12", "FOLATE", "FERRITIN", "TIBC", "IRON", "RETICCTPCT" in the last 72 hours. Sepsis Labs: No results for input(s): "PROCALCITON", "LATICACIDVEN" in the last 168 hours.  No results found for this or any previous visit (from the past 240 hour(s)).    Radiology Studies: No results found.  Scheduled Meds:  sodium chloride   Intravenous Once   ferrous sulfate  325 mg Oral Q breakfast   folic acid  1 mg Oral Daily   gabapentin  100 mg Oral TID   gabapentin  300 mg Oral QHS   insulin aspart  0-9 Units Subcutaneous Q4H   midodrine  5 mg Oral TID WC   nadolol  20 mg Oral Daily   nicotine  14 mg Transdermal Daily   nystatin  1 Application Topical TID   pantoprazole (PROTONIX) IV  40 mg Intravenous Q12H   rifaximin  550 mg Oral BID   sertraline  50 mg Oral Daily   sodium bicarbonate  650 mg Oral Daily   sodium zirconium cyclosilicate  10 g Oral BID   sucralfate  1 g Oral TID WC & HS   thiamine  100 mg Oral Daily   Continuous Infusions:  albumin human 50 g (05/23/23 2002)   octreotide (SANDOSTATIN) 500 mcg in sodium chloride 0.9 % 250 mL (2 mcg/mL) infusion 50 mcg/hr (05/24/23 0518)     LOS: 3 days   Hughie Closs, MD [facility]  05/24/2023, 9:59 AM   *  Please note that this is a verbal dictation therefore any spelling or grammatical errors are due to the "Dragon Medical One" system interpretation.  Please page via Amion and do not message via secure chat for urgent patient care matters. Secure chat can be used for non urgent patient care matters.  How to contact the [facility] Attending or Consulting provider 7A -  7P or covering provider during after hours 7P -7A, for this patient?  Check the care team in [facility] and look for a) attending/consulting [facility] provider listed and b) the [facility] team listed. Page or secure chat 7A-7P. Log into [email or web address] and use [place]'s universal password to access. If you do not have the password, please contact the hospital operator. Locate the [facility] provider you are looking for under [facility] and page to a number that you can be directly reached. If you still have difficulty reaching the provider, please page the [facility] (Director on Call) for the Hospitalists listed on amion for assistance.

## 2023-05-25 ENCOUNTER — Inpatient Hospital Stay: Payer: Medicaid Other | Admitting: Family Medicine

## 2023-05-25 DIAGNOSIS — K2981 Duodenitis with bleeding: Secondary | ICD-10-CM | POA: Diagnosis not present

## 2023-05-25 LAB — CBC WITH DIFFERENTIAL/PLATELET
Abs Immature Granulocytes: 0.02 10*3/uL (ref 0.00–0.07)
Basophils Absolute: 0 10*3/uL (ref 0.0–0.1)
Basophils Relative: 1 %
Eosinophils Absolute: 0.1 10*3/uL (ref 0.0–0.5)
Eosinophils Relative: 4 %
HCT: 22.7 % — ABNORMAL LOW (ref 39.0–52.0)
Hemoglobin: 7.5 g/dL — ABNORMAL LOW (ref 13.0–17.0)
Immature Granulocytes: 1 %
Lymphocytes Relative: 15 %
Lymphs Abs: 0.3 10*3/uL — ABNORMAL LOW (ref 0.7–4.0)
MCH: 32.2 pg (ref 26.0–34.0)
MCHC: 33 g/dL (ref 30.0–36.0)
MCV: 97.4 fL (ref 80.0–100.0)
Monocytes Absolute: 0.3 10*3/uL (ref 0.1–1.0)
Monocytes Relative: 16 %
Neutro Abs: 1.4 10*3/uL — ABNORMAL LOW (ref 1.7–7.7)
Neutrophils Relative %: 63 %
Platelets: 57 10*3/uL — ABNORMAL LOW (ref 150–400)
RBC: 2.33 MIL/uL — ABNORMAL LOW (ref 4.22–5.81)
RDW: 15.8 % — ABNORMAL HIGH (ref 11.5–15.5)
WBC: 2.1 10*3/uL — ABNORMAL LOW (ref 4.0–10.5)
nRBC: 0 % (ref 0.0–0.2)

## 2023-05-25 LAB — BASIC METABOLIC PANEL
Anion gap: 9 (ref 5–15)
BUN: 74 mg/dL — ABNORMAL HIGH (ref 6–20)
CO2: 13 mmol/L — ABNORMAL LOW (ref 22–32)
Calcium: 8.2 mg/dL — ABNORMAL LOW (ref 8.9–10.3)
Chloride: 110 mmol/L (ref 98–111)
Creatinine, Ser: 3.58 mg/dL — ABNORMAL HIGH (ref 0.61–1.24)
GFR, Estimated: 19 mL/min — ABNORMAL LOW (ref >60)
Glucose, Bld: 123 mg/dL — ABNORMAL HIGH (ref 70–99)
Potassium: 4.2 mmol/L (ref 3.5–5.1)
Sodium: 132 mmol/L — ABNORMAL LOW (ref 135–145)

## 2023-05-25 LAB — GLUCOSE, CAPILLARY
Glucose-Capillary: 119 mg/dL — ABNORMAL HIGH (ref 70–99)
Glucose-Capillary: 136 mg/dL — ABNORMAL HIGH (ref 70–99)
Glucose-Capillary: 192 mg/dL — ABNORMAL HIGH (ref 70–99)
Glucose-Capillary: 169 mg/dL — ABNORMAL HIGH (ref 70–99)
Glucose-Capillary: 81 mg/dL (ref 70–99)

## 2023-05-25 NOTE — Progress Notes (Signed)
[facility] Progress Note  Subjective: seen in room, eating regular food. No c/o's. Creat down to mid 3s  Vitals:   05/24/23 1950 05/25/23 0407 05/25/23 1301 05/25/23 1328  BP: 118/70 108/60  (!) 103/59  Pulse: (!) 48 (!) 53  (!) 50  Resp: 16 16  18   Temp: 98.3 F (36.8 C) 99.1 F (37.3 C)  98.1 F (36.7 C)  TempSrc: Oral Oral  Oral  SpO2: 100% 100%  100%  Weight:   81.7 kg   Height:        Exam:   alert, nad   no jvd  Chest cta bilat  Cor reg no RG  Abd soft ntnd no ascites   Ext no LE edema   Alert, NF, ox3      UA prot net, 0-5 rbc/ wbc/ epi    UNa < 10, UCr 54     Date   Creat  eGFR   2010- 2020  0.80- 1.20 > 60   Jan 2022  0.66   Nov 2022  4.56 >> 2.70 AKI on ckd   12/05/21  2.00   01/20/23  1.35   02/13/23  1.31   10/08- 04/07/23 1.63- 2.56    11/18- 05/12/23 1.87- 2.14 33- 42 ml/min, CKD 3b    12/06  2.32    12/07  4.31     12/08  4.83     12/09  4.43       Assessment/ Plan:  AKI on CKD 3b: b/l creatnine 1.8- 2.1 from nov 2024, eGFR 33-42 ml/min. Creat 2.3 here on admit in the setting of acute upper GIB w/ hypotension, also CT scan w/ contrast done early. AKI appears to be multifactorial with hemodynamic injury from relative hypotension and contrast-induced injury. There is the likelihood of HRS also given low UNa. BP lowering meds should be avoided given need for higher BP's w/ HRS. Creat continues to improve down to 3.5 today. Will dc octreotide and IV albumin, cont midodrine another day or so. Will follow.   Hyponatremia: Secondary to acute kidney injury and impaired free water handling.  Patient with underlying portal hypertension and inappropriate ADH activated state.  Fluid restriction.  Hyperkalemia - K+ 5.4, start lokelma and renal diet.   Acute blood loss anemia: Secondary to GI bleed associated with duodenitis, improved status post PRBC transfusion.  No indications noted for repeat endoscopy per GI notes.  Alcoholic cirrhosis/  ascites: Reports to be sober since September of this year.  Unfortunately not a candidate for liver transplant evaluation at this time.   Vinson Moselle MD  [facility] 05/25/2023, 3:53 PM  Recent Labs  Lab 05/21/23 0532 05/22/23 0733 05/24/23 0442 05/24/23 0857 05/24/23 1419 05/25/23 0439  HGB 7.4*   < >  --  7.9*  --  7.5*  ALBUMIN 2.5*  --  2.6*  --   --   --   CALCIUM 8.8*   < > 7.8*  --   --  8.2*  PHOS  --   --  5.9*  --   --   --   CREATININE 2.32*   < > 4.43*  --   --  3.58*  K 5.4*   < > 5.3*  --  5.4* 4.2   < > = values in this interval not displayed.   No results for input(s): "IRON", "TIBC", "FERRITIN" in the last 168 hours. Inpatient medications:  sodium chloride   Intravenous Once   ferrous sulfate  325  mg Oral Q breakfast   folic acid  1 mg Oral Daily   gabapentin  100 mg Oral TID   gabapentin  300 mg Oral QHS   insulin aspart  0-9 Units Subcutaneous Q4H   midodrine  5 mg Oral TID WC   nicotine  14 mg Transdermal Daily   nystatin  1 Application Topical TID   pantoprazole (PROTONIX) IV  40 mg Intravenous Q12H   rifaximin  550 mg Oral BID   sertraline  50 mg Oral Daily   sodium bicarbonate  650 mg Oral Daily   sucralfate  1 g Oral TID WC & HS   thiamine  100 mg Oral Daily     morphine injection, ondansetron **OR** ondansetron (ZOFRAN) IV, mouth rinse

## 2023-05-25 NOTE — Progress Notes (Signed)
[facility] Progress Note  HAYDYN AUBE 57 y.o. 27-Aug-1965   Subjective: Feels better. Eating solid food without difficulty. Right-sided abdominal pain has decreased.  Objective: Vital signs: Vitals:   05/25/23 0407 05/25/23 1328  BP: 108/60 (!) 103/59  Pulse: (!) 53 (!) 50  Resp: 16 18  Temp: 99.1 F (37.3 C) 98.1 F (36.7 C)  SpO2: 100% 100%    Physical Exam: Gen: lethargic, thin, no acute distress, pleasant HEENT: anicteric sclera CV: RRR Chest: CTA B Abd: right-sided tenderness with guarding, soft, nondistended, +BS, ostomy bag intact Ext: no edema  Lab Results: Recent Labs    05/24/23 0442 05/24/23 1419 05/25/23 0439  NA 127*  --  132*  K 5.3* 5.4* 4.2  CL 107  --  110  CO2 13*  --  13*  GLUCOSE 131*  --  123*  BUN 78*  --  74*  CREATININE 4.43*  --  3.58*  CALCIUM 7.8*  --  8.2*  PHOS 5.9*  --   --    Recent Labs    05/24/23 0442  ALBUMIN 2.6*   Recent Labs    05/24/23 0857 05/25/23 0439  WBC 2.2* 2.1*  NEUTROABS 1.4* 1.4*  HGB 7.9* 7.5*  HCT 23.1* 22.7*  MCV 95.1 97.4  PLT 55* 57*      Assessment/Plan: Cirrhosis with resolved GI bleed. AKI with improving serum Cr followed by nephrology. No new GI recs. Will sign off. Call if questions.   Shirley Friar 05/25/2023, 3:16 PM  Questions please call [phone] ID: LACARLOS RESPASS, male   DOB: [birth year], 57 y.o.   MRN: [id number]

## 2023-05-25 NOTE — Plan of Care (Signed)

## 2023-05-25 NOTE — Progress Notes (Addendum)
PROGRESS NOTE    Martin Anthony  RUE:[id number] DOB: 1965-08-19 DOA: 05/20/2023 PCP: Nelwyn Salisbury, MD   Brief Narrative:  Martin STEMARIE is a 57 y.o. 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  EGD did also reveal a small esophageal varix as well but this wasn't bleeding and had no stigmata of recent bleed.   Last EtOH = Sept 2024.  Sober for 3 months now.   Pt presented to ED with recurrent diffuse abd pain, and recurrent dark blood with clots in ostomy bag 5 times today.  Symptoms are similar / the same as last week when he was admitted.    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 Has a small esophageal varix as well on that EGD that wasn't bleeding and no stigmata of recent bleed.  GI consulted, seen by Dr. Dulce Sellar 05/21/2023, he recommended continuing octreotide for 1-2 more days and then transitioning to oral propranolol 10 mg p.o. twice daily but did not recommend any intervention as inpatient and recommended follow-up with his primary GI as outpatient for surveillance colonoscopy and possible EGD.  Baseline hemoglobin around 8 however it dropped to 5.9 on 05/22/23, transfused 1 unit, hemoglobin 7.5 today.  GI saw him yesterday but no comment about octreotide.  Patient has been  on octreotide for almost 4 days now.  I will discontinue that.  Continue rest of the medications.  Repeat H&H tomorrow morning.  Pancytopenia: Patient is now developing leukopenia and he already had thrombocytopenia.  Will repeat CBC in the morning.  Mild acute pancreatitis: Abdominal pain in combination with slightly elevated lipase level confirms diagnosis of mild pancreatitis.  Patient tolerating soft diet.     Type 2 diabetes mellitus with neurological complications (HCC) Previous history of diabetes, however hemoglobin A1c has remained either in prediabetes or normal range since last 4 years.  Hyperglycemic in 500s in ED with no anion gap. Got 10u novolog x1 in ED.  Blood sugar controlled, continue SSI.  Repeat hemoglobin A1c 6.2 this admission.   Alcoholic cirrhosis of liver with ascites (HCC) Pt with ESLD, last drink in Sept it seems.  Creatinine rising, continue to hold diuretics for now. Pt unable to obtain nadolol so kept taking coreg after discharge on 11/27.  Switched to nadolol upon admission.   Hypotension: No history of hypertension.  Hypertension ruled out. On midodrine, blood pressure on the low side but he is asymptomatic.   PAF (paroxysmal atrial fibrillation) (HCC) Chads Vasc of 4 but Has Bled score of > 3 = not candidate for [facility] Also having acute GIB this time.   AKI on CKD stage IIIb: Baseline creatinine around 1.9, creatinine rose further to 4.83 but has started to improve since he was started on albumin and currently down to  3.58.  This was secondary to hepatorenal syndrome.  Beta-blocker was stopped to allow blood pressure to rise.  Appreciate nephrology help.   Hyperkalemia: Resolved with Lokelma.  DVT prophylaxis: SCDs Start: 05/20/23 2118   Code Status: Full Code  Family Communication:  None present at bedside.  Plan of care discussed with patient in length and he/she verbalized understanding and agreed with it.  Status is: Inpatient Remains inpatient  appropriate because: Anemia, require blood transfusion.     Estimated body mass index is 21.3 kg/m as calculated from the following:   Height as of this encounter: 6\' 4"  (1.93 m).   Weight as of this encounter: 79.4 kg.    Nutritional Assessment: Body mass index is 21.3 kg/m.Marland Kitchen Seen by dietician.  I agree with the assessment and plan as outlined below: Nutrition Status:        . Skin Assessment: I have examined the patient's skin and I agree with the wound assessment as performed by the wound care RN as outlined below:    Consultants:  GI  Procedures:  As above  Antimicrobials:  Anti-infectives (From admission, onward)    Start     Dose/Rate Route Frequency Ordered Stop   05/20/23 2200  rifaximin (XIFAXAN) tablet 550 mg        550 mg Oral 2 times daily 05/20/23 2057           Subjective: Seen and examined.  Continues to complain of mild abdominal pain.  No other complaints.  Objective: Vitals:   05/24/23 0356 05/24/23 1255 05/24/23 1950 05/25/23 0407  BP: (!) 101/59 105/72 118/70 108/60  Pulse: (!) 49 (!) 48 (!) 48 (!) 53  Resp: 20 18 16 16   Temp: 98.5 F (36.9 C) 98.1 F (36.7 C) 98.3 F (36.8 C) 99.1 F (37.3 C)  TempSrc: Oral Oral Oral Oral  SpO2: 100% 99% 100% 100%  Weight:      Height:        Intake/Output Summary (Last 24 hours) at 05/25/2023 1255 Last data filed at 05/25/2023 1610 Gross per 24 hour  Intake 720 ml  Output --  Net 720 ml    Filed Weights   05/20/23 1517  Weight: 79.4 kg    Examination:  General exam: Appears calm and comfortable  Respiratory system: Clear to auscultation. Respiratory effort normal. Cardiovascular system: S1 & S2 heard, RRR. No JVD, murmurs, rubs, gallops or clicks. No pedal edema. Gastrointestinal system: Abdomen is nondistended, soft and epigastric and right upper quadrant tenderness. No organomegaly or masses felt. Normal bowel sounds heard.  Ileostomy bag noted. Central nervous system: Alert and  oriented. No focal neurological deficits. Extremities: Symmetric 5 x 5 power. Skin: No rashes, lesions or ulcers.  Psychiatry: Judgement and insight appear normal. Mood & affect appropriate.   Data Reviewed: I have personally reviewed following labs and imaging studies  CBC: Recent Labs  Lab 05/20/23 1534 05/20/23 2330 05/21/23 0532 05/22/23 0733 05/22/23 1938 05/23/23 0243 05/23/23 1428 05/24/23 0857 05/25/23 0439  WBC 4.0  --  6.2 6.4  --  4.5  --  2.2* 2.1*  NEUTROABS 3.0  --   --  4.4  --  3.1  --  1.4* 1.4*  HGB 8.3*   < > 7.4* 5.9* 6.6* 7.0* 7.9* 7.9* 7.5*  HCT 25.7*   < > 22.5* 18.6* 20.2* 20.8* 24.6* 23.1* 22.7*  MCV 99.2  --  97.8 101.1*  --  97.7  --  95.1 97.4  PLT 87*  --  91* 80*  --  56*  --  55* 57*   < > = values in this interval not displayed.   Basic Metabolic Panel: Recent Labs  Lab 05/21/23 0532 05/22/23 0733 05/22/23 1408 05/23/23 0650 05/23/23 1428 05/24/23 0442 05/24/23 1419 05/25/23 0439  NA 134* 131*  --  126*  --  127*  --  132*  K 5.4* 6.1*   < > 5.0 4.5 5.3* 5.4* 4.2  CL 110 108  --  107  --  107  --  110  CO2 18* 16*  --  12*  --  13*  --  13*  GLUCOSE 155* 151*  --  161*  --  131*  --  123*  BUN 51* 71*  --  81*  --  78*  --  74*  CREATININE 2.32* 4.31*  --  4.83*  --  4.43*  --  3.58*  CALCIUM 8.8* 7.9*  --  7.8*  --  7.8*  --  8.2*  PHOS  --   --   --   --   --  5.9*  --   --    < > = values in this interval not displayed.   GFR: Estimated Creatinine Clearance: 25.9 mL/min (A) (by C-G formula based on SCr of 3.58 mg/dL (H)). Liver Function Tests: Recent Labs  Lab 05/20/23 1534 05/21/23 0532 05/24/23 0442  AST 40 38  --   ALT 18 18  --   ALKPHOS 113 100  --   BILITOT 2.7* 3.0*  --   PROT 6.6 6.7  --   ALBUMIN 2.3* 2.5* 2.6*   Recent Labs  Lab 05/20/23 1534  LIPASE 438*   Recent Labs  Lab 05/20/23 2330  AMMONIA 34   Coagulation Profile: Recent Labs  Lab 05/20/23 1534  INR 1.7*   Cardiac Enzymes: No results  for input(s): "CKTOTAL", "CKMB", "CKMBINDEX", "TROPONINI" in the last 168 hours. BNP (last 3 results) No results for input(s): "PROBNP" in the last 8760 hours. HbA1C: No results for input(s): "HGBA1C" in the last 72 hours.  CBG: Recent Labs  Lab 05/24/23 2017 05/24/23 2356 05/25/23 0404 05/25/23 0745 05/25/23 1150  GLUCAP 126* 129* 119* 136* 192*   Lipid Profile: No results for input(s): "CHOL", "HDL", "LDLCALC", "TRIG", "CHOLHDL", "LDLDIRECT" in the last 72 hours. Thyroid Function Tests: No results for input(s): "TSH", "T4TOTAL", "FREET4", "T3FREE", "THYROIDAB" in the last 72 hours. Anemia Panel: No results for input(s): "VITAMINB12", "FOLATE", "FERRITIN", "TIBC", "IRON", "RETICCTPCT" in the last 72 hours. Sepsis Labs: No results for input(s): "PROCALCITON", "LATICACIDVEN" in the last 168 hours.  No results found for this or any previous visit (from the past 240 hour(s)).    Radiology Studies: No results found.  Scheduled Meds:  sodium chloride   Intravenous Once   ferrous sulfate  325 mg Oral Q breakfast   folic acid  1 mg Oral Daily   gabapentin  100 mg Oral TID   gabapentin  300 mg Oral QHS   insulin aspart  0-9 Units Subcutaneous Q4H   midodrine  5 mg Oral TID WC   nicotine  14 mg Transdermal Daily   nystatin  1 Application Topical TID   pantoprazole (PROTONIX) IV  40 mg Intravenous Q12H   rifaximin  550 mg Oral BID   sertraline  50 mg Oral Daily   sodium bicarbonate  650 mg Oral Daily   sucralfate  1 g Oral TID WC & HS   thiamine  100 mg  Oral Daily   Continuous Infusions:  octreotide (SANDOSTATIN) 500 mcg in sodium chloride 0.9 % 250 mL (2 mcg/mL) infusion 50 mcg/hr (05/25/23 1142)     LOS: 4 days   Hughie Closs, MD [facility]  05/25/2023, 12:55 PM   *Please note that this is a verbal dictation therefore any spelling or grammatical errors are due to the "Dragon [employer] One" system interpretation.  Please page via Amion and do not message via  secure chat for urgent patient care matters. Secure chat can be used for non urgent patient care matters.  How to contact the [facility] Attending or Consulting provider 7A - 7P or covering provider during after hours 7P -7A, for this patient?  Check the care team in [facility] and look for a) attending/consulting [facility] provider listed and b) the [facility] team listed. Page or secure chat 7A-7P. Log into [email or web address] and use Elsie's universal password to access. If you do not have the password, please contact the hospital operator. Locate the [facility] provider you are looking for under [facility] and page to a number that you can be directly reached. If you still have difficulty reaching the provider, please page the [facility] (Director on Call) for the Hospitalists listed on amion for assistance.

## 2023-05-26 ENCOUNTER — Other Ambulatory Visit (HOSPITAL_COMMUNITY): Payer: Self-pay

## 2023-05-26 DIAGNOSIS — K2981 Duodenitis with bleeding: Secondary | ICD-10-CM | POA: Diagnosis not present

## 2023-05-26 LAB — BASIC METABOLIC PANEL
Anion gap: 8 (ref 5–15)
BUN: 61 mg/dL — ABNORMAL HIGH (ref 6–20)
CO2: 10 mmol/L — ABNORMAL LOW (ref 22–32)
Calcium: 8.4 mg/dL — ABNORMAL LOW (ref 8.9–10.3)
Chloride: 110 mmol/L (ref 98–111)
Creatinine, Ser: 3.24 mg/dL — ABNORMAL HIGH (ref 0.61–1.24)
GFR, Estimated: 22 mL/min — ABNORMAL LOW (ref >60)
Glucose, Bld: 136 mg/dL — ABNORMAL HIGH (ref 70–99)
Potassium: 4.4 mmol/L (ref 3.5–5.1)
Sodium: 128 mmol/L — ABNORMAL LOW (ref 135–145)

## 2023-05-26 LAB — GLUCOSE, CAPILLARY
Glucose-Capillary: 101 mg/dL — ABNORMAL HIGH (ref 70–99)
Glucose-Capillary: 118 mg/dL — ABNORMAL HIGH (ref 70–99)
Glucose-Capillary: 135 mg/dL — ABNORMAL HIGH (ref 70–99)

## 2023-05-26 NOTE — Progress Notes (Signed)
[facility] Progress Note  Subjective: seen in room. Creat down at 3.2  Vitals:   05/25/23 1301 05/25/23 1328 05/25/23 2028 05/26/23 0344  BP:  (!) 103/59 117/66 111/64  Pulse:  (!) 50 (!) 49 (!) 49  Resp:  18 16 14   Temp:  98.1 F (36.7 C) 97.8 F (36.6 C) 97.8 F (36.6 C)  TempSrc:  Oral Oral   SpO2:  100% 100% 100%  Weight: 81.7 kg     Height:        Exam:   alert, nad   no jvd  Chest cta bilat  Cor reg no RG  Abd soft ntnd no ascites   Ext no LE edema   Alert, NF, ox3      UA prot net, 0-5 rbc/ wbc/ epi    UNa < 10, UCr 54     Date   Creat  eGFR   2010- 2020  0.80- 1.20 > 60   Jan 2022  0.66   Nov 2022  4.56 >> 2.70 AKI on ckd   12/05/21  2.00   01/20/23  1.35   02/13/23  1.31   10/08- 04/07/23 1.63- 2.56    11/18- 05/12/23 1.87- 2.14 33- 42 ml/min, CKD 3b    12/06  2.32    12/07  4.31     12/08  4.83     12/09  4.43       Assessment/ Plan:  AKI on CKD 3b: b/l creatnine 1.8- 2.1 from nov 2024, eGFR 33-42 ml/min. Creat 2.3 here on admit in the setting of acute upper GIB w/ hypotension, also CT scan w/ contrast done early. AKI appears to be multifactorial with hemodynamic injury from relative hypotension and contrast-induced injury. There is the likelihood of HRS also given low UNa. BP lowering meds should be avoided given need for higher BP's w/ HRS. Creat continues to improve. Octreotide and IV albumin were dc'd yesterday. Ok for [employer] today. Would resume lasix, aldactone, and beta-blocker upon discharge. Have d/w pmd. Our office will schedule a f/u appt for AKI on CKD. Will sign off.   Hyponatremia: patient with underlying portal hypertension and inappropriate ADH activated state.  Na+ 128- 135 here. Not likely to respond to usual measures due to cirrhosis.   Hyperkalemia - resolved  Acute blood loss anemia: Secondary to GI bleed associated with duodenitis, improved status post PRBC transfusion.  No indications noted for repeat endoscopy per GI  notes.  Alcoholic cirrhosis/ ascites: Reports to be sober since September of this year.  Unfortunately not a candidate for liver transplant evaluation at this time.   Vinson Moselle MD  [facility] 05/26/2023, 12:22 PM  Recent Labs  Lab 05/21/23 0532 05/22/23 0733 05/24/23 0442 05/24/23 0857 05/24/23 1419 05/25/23 0439 05/26/23 0450  HGB 7.4*   < >  --  7.9*  --  7.5*  --   ALBUMIN 2.5*  --  2.6*  --   --   --   --   CALCIUM 8.8*   < > 7.8*  --   --  8.2* 8.4*  PHOS  --   --  5.9*  --   --   --   --   CREATININE 2.32*   < > 4.43*  --   --  3.58* 3.24*  K 5.4*   < > 5.3*  --    < > 4.2 4.4   < > = values in this interval not displayed.   No results for  input(s): "IRON", "TIBC", "FERRITIN" in the last 168 hours. Inpatient medications:  sodium chloride   Intravenous Once   ferrous sulfate  325 mg Oral Q breakfast   folic acid  1 mg Oral Daily   gabapentin  100 mg Oral TID   gabapentin  300 mg Oral QHS   insulin aspart  0-9 Units Subcutaneous Q4H   midodrine  5 mg Oral TID WC   nicotine  14 mg Transdermal Daily   nystatin  1 Application Topical TID   pantoprazole (PROTONIX) IV  40 mg Intravenous Q12H   rifaximin  550 mg Oral BID   sertraline  50 mg Oral Daily   sodium bicarbonate  650 mg Oral Daily   sucralfate  1 g Oral TID WC & HS   thiamine  100 mg Oral Daily     morphine injection, ondansetron **OR** ondansetron (ZOFRAN) IV, mouth rinse

## 2023-05-26 NOTE — Discharge Summary (Signed)
Physician Discharge Summary  Martin Anthony ZHY:[id number] DOB: May 24, 1966 DOA: 05/20/2023  PCP: Nelwyn Salisbury, MD  Admit date: 05/20/2023 Discharge date: 05/26/2023 30 Day Unplanned Readmission Risk Score    Flowsheet Row ED to Hosp-Admission (Current) from 05/20/2023 in [facility]  30 Day Unplanned Readmission Risk Score (%) 45.5 Filed at 05/26/2023 0801       This score is the patient's risk of an unplanned readmission within 30 days of being discharged (0 -100%). The score is based on dignosis, age, lab data, medications, orders, and past utilization.   Low:  0-14.9   Medium: 15-21.9   High: 22-29.9   Extreme: 30 and above          Admitted From: Home Disposition: Home  Recommendations for Outpatient Follow-up:  Follow up with PCP in 1-2 weeks Please obtain BMP/CBC in one week Please follow-up with your primary GI for consideration of EGD Please follow-up with nephrology in 2 to 3 weeks.  Their office should contact you for appointment Please follow up with your PCP on the following pending results: Unresulted Labs (From admission, onward)    None         Home Health: None Equipment/Devices: None  Discharge Condition: Stable CODE STATUS: Full code Diet recommendation: Cardiac  Subjective: Seen and examined.  His abdominal pain is tolerable.  He is tolerating diet.  He has no other complaint.  He wants to go home today.  Brief/Interim Summary: Martin Anthony is a 57 y.o. 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  EGD did also reveal a small esophageal varix as well but this wasn't bleeding and had no stigmata of recent bleed.  Last EtOH = Sept 2024.  Sober for 3 months now.  Pt presented to ED with recurrent diffuse abd pain, and  recurrent dark blood with clots in ostomy bag 5 times today.  Symptoms are similar / the same as last week when he was admitted.  Admitted under hospitalist service, GI and nephrology consulted.  Details as below.   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 Has a small esophageal varix as well on that EGD that wasn't bleeding and no stigmata of recent bleed.  GI consulted, seen by Dr. Dulce Sellar 05/21/2023, he recommended continuing octreotide for 1-2 more days and then transitioning to oral propranolol 10 mg p.o. twice daily but did not recommend any intervention as inpatient and recommended follow-up with his primary GI as outpatient for surveillance colonoscopy and possible EGD.  Baseline hemoglobin around 8 however it dropped to 5.9 on 05/22/23, transfused 1 unit, hemoglobin has remained over 7 since then.  We stopped his octreotide yesterday.  However we are not going to start him on any beta-blocker due to the fact that he has remained bradycardic with rates as low as 40s and nephrology also wants his blood pressure to remain high to prevent hepatorenal syndrome so they recommended discontinuing as well.  He is recommended to follow-up with his primary GI as soon as possible.  Continue PPI.     Pancytopenia: Patient is now developing leukopenia and he already had thrombocytopenia.  Repeat CBC at PCPs office.   Mild acute pancreatitis: Abdominal pain in combination with slightly elevated lipase level confirms diagnosis of mild pancreatitis.  Patient tolerating soft diet.     Type 2 diabetes mellitus with neurological complications (HCC) Previous history of diabetes, however hemoglobin A1c has remained either in prediabetes or normal range since last 4 years.  Hyperglycemic in 500s in ED with no anion gap. Got 10u novolog x1 in ED. he was treated with SSI here.   Repeat hemoglobin A1c 6.2 this admission.   Alcoholic cirrhosis of liver with ascites (HCC) Pt with ESLD, last drink in Sept it seems. Pt unable to obtain nadolol so kept taking coreg after discharge on 11/27.  Switched to nadolol upon admission but this was discontinued after nephrology recommended.  We are now resuming his Lasix and Aldactone after confirming with nephrology.   Hypotension: No history of hypertension.  Hypertension ruled out. On midodrine, blood pressure improving.   PAF (paroxysmal atrial fibrillation) (HCC) Chads Vasc of 4 but Has Bled score of > 3 = not candidate for [facility] Also having acute GIB this time.   AKI on CKD stage IIIb/possible hepatorenal syndrome: Baseline creatinine around 1.9, creatinine rose further to 4.83 which was presumed to be secondary to hepatorenal syndrome.  Nephrology was consulted.  Patient's beta-blocker were stopped and he was started on albumin.  Creatinine has improved to 3.2 today.  Nephrology has cleared him for discharge with instructions to continue midodrine but no beta-blocker.  Resume Lasix and Aldactone.    Hyperkalemia: Resolved with Lokelma.  Discharge plan was discussed with patient and/or family member and they verbalized understanding and agreed with it.  Discharge Diagnoses:  Principal Problem:   Duodenitis with bleeding Active Problems:   Anemia   Essential hypertension   Alcoholic cirrhosis of liver with ascites (HCC)   Type 2 diabetes mellitus with neurological complications (HCC)   CKD (chronic kidney disease) stage 3, GFR 30-59 ml/min (HCC)   PAF (paroxysmal atrial fibrillation) (HCC)   GIB (gastrointestinal bleeding)    Discharge Instructions   Allergies as of 05/26/2023       Reactions   Acetaminophen Other (See Comments)   Hx of advanced cirrhosis/liver disease   Ibuprofen Other (See Comments)   Hx of advanced cirrhosis/liver disease        Medication List     STOP taking these medications     carvedilol 3.125 MG tablet Commonly known as: COREG   nadolol 20 MG tablet Commonly known as: Corgard       TAKE these medications    folic acid 1 MG tablet Commonly known as: FOLVITE Take 1 tablet (1 mg total) by mouth daily.   furosemide 20 MG tablet Commonly known as: LASIX Take 1 tablet (20 mg total) by mouth daily.   gabapentin 300 MG capsule Commonly known as: NEURONTIN Take 300 mg by mouth at bedtime. Take in addition to 100mg  three times daily for a total daily dose of 600mg  What changed: Another medication with the same name was changed. Make sure you understand how and when to take each.   gabapentin 100 MG capsule Commonly known as: NEURONTIN Take 1 capsule (100 mg total) by mouth 3 (three) times daily. What changed: additional instructions   Iron (Ferrous Sulfate) 325 (65 Fe) MG Tabs Take 325 mg by mouth daily. What changed: when to take this   ketoconazole 2 % cream Commonly known as: NIZORAL Apply 1 Application topically 2 (two) times daily.  midodrine 5 MG tablet Commonly known as: PROAMATINE Take 1 tablet (5 mg total) by mouth 3 (three) times daily with meals.   nicotine 14 mg/24hr patch Commonly known as: NICODERM CQ - dosed in mg/24 hours Place 1 patch (14 mg total) onto the skin daily.   nystatin powder Commonly known as: MYCOSTATIN/NYSTOP Apply 1 Application topically 3 (three) times daily.   pantoprazole 40 MG tablet Commonly known as: Protonix Take 1 tablet (40 mg total) by mouth daily. Start taking on: June 11, 2023   rifaximin 550 MG Tabs tablet Commonly known as: XIFAXAN Take 1 tablet (550 mg total) by mouth 2 (two) times daily.   sertraline 50 MG tablet Commonly known as: ZOLOFT Take 1 tablet (50 mg total) by mouth daily.   spironolactone 100 MG tablet Commonly known as: ALDACTONE Take 1 tablet (100 mg total) by mouth daily.   sucralfate 1 g tablet Commonly known as: CARAFATE Take 1 tablet (1 g total) by mouth 4  (four) times daily -  with meals and at bedtime.   thiamine 100 MG tablet Commonly known as: VITAMIN B1 Take 1 tablet (100 mg total) by mouth daily.        Follow-up Information     Nelwyn Salisbury, MD Follow up in 1 week(s).   Specialty: Family Medicine Contact information: [address] [place] [address] [phone]                Allergies  Allergen Reactions   Acetaminophen Other (See Comments)    Hx of advanced cirrhosis/liver disease   Ibuprofen Other (See Comments)    Hx of advanced cirrhosis/liver disease     Consultations: Nephrology and GI   Procedures/Studies: CT Angio Abd/Pel W and/or Wo Contrast  Result Date: 05/20/2023 CLINICAL DATA:  Lower GI bleed. EXAM: CTA ABDOMEN AND PELVIS WITHOUT AND WITH CONTRAST TECHNIQUE: Multidetector CT imaging of the abdomen and pelvis was performed using the standard protocol during bolus administration of intravenous contrast. Multiplanar reconstructed images and MIPs were obtained and reviewed to evaluate the vascular anatomy. RADIATION DOSE REDUCTION: This exam was performed according to the departmental dose-optimization program which includes automated exposure control, adjustment of the mA and/or kV according to patient size and/or use of iterative reconstruction technique. CONTRAST:  80mL OMNIPAQUE IOHEXOL 350 MG/ML SOLN COMPARISON:  05/08/2023 FINDINGS: VASCULAR Aorta: Aortic atherosclerotic calcifications. Normal caliber aorta without aneurysm, dissection, vasculitis or significant stenosis. Celiac: Patent without evidence of aneurysm, dissection, vasculitis or significant stenosis. SMA: Patent without evidence of aneurysm, dissection, vasculitis or significant stenosis. Renals: Both renal arteries are patent without evidence of aneurysm, dissection, vasculitis, fibromuscular dysplasia or significant stenosis. IMA: Patent without evidence of aneurysm, dissection, vasculitis or significant stenosis. Inflow:  Patent without evidence of aneurysm, dissection, vasculitis or significant stenosis. Proximal Outflow: Bilateral common femoral and visualized portions of the superficial and profunda femoral arteries are patent without evidence of aneurysm, dissection, vasculitis or significant stenosis. Veins: Patent. Left abdominal Porto systemic varix is identified, image 49/19. Distal esophageal varices. Review of the MIP images confirms the above findings. NON-VASCULAR Lower chest: No acute abnormality. Hepatobiliary: The uterus appears atrophic with diffuse Leigh nodular contour compatible with advanced cirrhosis. Too small to characterize low-density structure within segment 4 measures 6 mm, image 13/19. No enhancing liver lesions identified. There is mild diffuse bladder wall thickening which measures up to 5 mm, nonspecific in the setting of advanced cirrhosis and portal venous hypertension. No gallstones identified. No signs of bile duct dilatation.  Pancreas: Unremarkable. No pancreatic ductal dilatation or surrounding inflammatory changes. Spleen: Splenomegaly. Spleen measures 15.2 cm in cranial caudal dimension. Adrenals/Urinary Tract: Normal appearance of the adrenal glands. Nonobstructing stone within the inferior pole of the right kidney measures 5 mm. Several left renal calculi are also noted which measure up to 3 mm. No signs of kidney mass or obstructive uropathy. Urinary bladder appears normal. Stomach/Bowel: Stomach appears nondistended. Mild wall thickening with surrounding soft tissue stranding is noted about the descending portion of the duodenum. Right-sided paramidline ileostomy the is identified. No pathologic dilatation of the large or small bowel loops. postoperative changes from right hemicolectomy. Long segment of Hartmann's pouch is identified which terminates in the right upper quadrant of the abdomen.No signs of active intraluminal contrast extravasation within the large or small bowel loops to  indicate site of suspected GI bleed. Lymphatic: No significant vascular findings are present. No enlarged abdominal or pelvic lymph nodes. Reproductive: Prostate is unremarkable. Other: Trace free fluid noted within the pelvis, image 75/19. No focal fluid collections identified to suggest abscess. Musculoskeletal: No acute or significant osseous findings. IMPRESSION: 1. No signs of active intraluminal contrast extravasation within the large or small bowel loops to indicate site of suspected GI bleed. 2. Advanced cirrhosis with signs of portal venous hypertension including splenomegaly, left abdominal Porto systemic varix and distal esophageal varices. 3. Wall thickening with surrounding soft tissue stranding about the descending portion of the duodenum. Findings are nonspecific in the setting of advanced cirrhosis and portal venous hypertension. Correlate for any clinical signs or symptoms of duodenitis 4. Nonobstructing bilateral renal calculi. 5. Aortic Atherosclerosis (ICD10-I70.0). Electronically Signed   By: Signa Kell M.D.   On: 05/20/2023 19:17   US Paracentesis  Result Date: 05/12/2023 INDICATION: ASCITES WITH ABDOMINAL DISTENSION Reported history of colon cancer, alcoholic cirrhosis, and portal hypertension with recurrent ascites EXAM: ULTRASOUND GUIDED DIAGNOSTIC AND THERAPEUTIC PARACENTESIS MEDICATIONS: 9 cc of 1% lidocaine COMPLICATIONS: SIR Level A - No therapy, no consequence. Localized small to moderate hematoma at the puncture site PROCEDURE: Informed written consent was obtained from the patient after a discussion of the risks, benefits and alternatives to treatment. A timeout was performed prior to the initiation of the procedure. Initial ultrasound scanning demonstrates a large amount of ascites within the right lower abdominal quadrant. The right lower abdomen was prepped and draped in the usual sterile fashion. 1% lidocaine was used for local anesthesia. Following this, a 19 gauge,  7-cm, Yueh catheter was introduced. An ultrasound image was saved for documentation purposes. The paracentesis was performed. The catheter was removed and a dressing was applied. The patient tolerated the procedure well without immediate post procedural complication. Patient received post-procedure intravenous albumin; see nursing notes for details. FINDINGS: A total of approximately 2.0 L of dark yellow fluid was removed. Samples were sent to the laboratory as requested by the clinical team. IMPRESSION: Successful ultrasound-guided paracentesis yielding 2.0 liters of peritoneal fluid. Performed by Buzzy Han, PA-C Electronically Signed   By: Roanna Banning M.D.   On: 05/12/2023 13:41   CT ABDOMEN PELVIS W CONTRAST  Result Date: 05/08/2023 CLINICAL DATA:  Abdominal pain. History of cirrhosis and pancreatitis. EXAM: CT ABDOMEN AND PELVIS WITH CONTRAST TECHNIQUE: Multidetector CT imaging of the abdomen and pelvis was performed using the standard protocol following bolus administration of intravenous contrast. RADIATION DOSE REDUCTION: This exam was performed according to the departmental dose-optimization program which includes automated exposure control, adjustment of the mA and/or kV according to patient size and/or  use of iterative reconstruction technique. CONTRAST:  75mL OMNIPAQUE IOHEXOL 300 MG/ML  SOLN COMPARISON:  CT abdomen and pelvis 03/23/2023 FINDINGS: Lower chest: No acute abnormality. Hepatobiliary: Nodular liver contour is again seen compatible with cirrhosis. Mild diffuse heterogeneity persists. Gallbladder is within normal limits. No biliary ductal dilatation. Pancreas: Unremarkable. No pancreatic ductal dilatation or surrounding inflammatory changes. Spleen: The spleen is enlarged, unchanged. Adrenals/Urinary Tract: There are punctate nonobstructing bilateral renal calculi measuring up to 6 mm. There is no hydronephrosis or perinephric fluid. The adrenal glands and bladder are within normal  limits. Stomach/Bowel: Right colectomy changes are present. Right-sided ostomy is again seen. There is no evidence for bowel obstruction, pneumatosis or free air. There is marked circumferential wall thickening and inflammation of the second portion of the duodenal. Stomach is within normal limits. Vascular/Lymphatic: Aorta and IVC are normal in size. Splenic varices and splenorenal shunt are again noted. Paraesophageal varices are present as well. No enlarged lymph nodes are identified. Reproductive: Prostate is unremarkable. Other: There is a moderate-sized right inguinal hernia containing ascites. There is small volume ascites throughout the abdomen and pelvis. Musculoskeletal: No acute or significant osseous findings. IMPRESSION: 1. Marked circumferential wall thickening and inflammation of the second portion of the duodenum compatible with duodenitis. Follow-up recommended to exclude underlying process such as neoplasm. 2. Cirrhosis with sequela of portal hypertension including splenomegaly, varices, and small volume ascites. 3. Nonobstructing bilateral renal calculi. 4. Right colectomy changes with right-sided ostomy. 5. Moderate-sized right inguinal hernia containing ascites. Electronically Signed   By: Darliss Cheney M.D.   On: 05/08/2023 22:25   [name] ASCITES (ABDOMEN LIMITED)  Result Date: 05/08/2023 CLINICAL DATA:  57 year old male presenting for paracentesis. EXAM: LIMITED ABDOMEN ULTRASOUND FOR ASCITES TECHNIQUE: Limited ultrasound survey for ascites was performed in all four abdominal quadrants. COMPARISON:  Earlier the same day FINDINGS: Trace right upper and left lower quadrant ascites. No safe window for paracentesis. IMPRESSION: Trace ascites.  No paracentesis performed. Marliss Coots, MD [facility] Electronically Signed   By: Marliss Coots M.D.   On: 05/08/2023 11:59   US Abdomen Complete  Result Date: 05/08/2023 CLINICAL DATA:   Abdominal pain EXAM: ABDOMEN ULTRASOUND COMPLETE COMPARISON:  04/06/2023, CT 03/26/2023 FINDINGS: Gallbladder: The gallbladder wall is markedly thickened, nonspecific in the setting of cirrhosis and ascites. This appears similar to prior examination. The gallbladder, however, is not distended. No intraluminal stones or sludge is identified. The sonographic Eulah Pont sign is reportedly negative. Common bile duct: The extrahepatic bile duct is not well visualized on this examination. Liver: The liver contour is nodular, the liver is shrunken, and the hepatic echotexture is diffusely coarsened in keeping with changes of advanced cirrhosis. No focal intrahepatic mass identified. No intrahepatic biliary ductal dilation. Portal vein is patent on color Doppler imaging with normal direction of blood flow towards the liver. IVC: No abnormality visualized. Pancreas: Not well visualized on this examination Spleen: Moderately enlarged measuring up to 19.1 cm in greatest dimension. No intrasplenic lesions identified. Right Kidney: Length: 9.1 cm. Echogenicity within normal limits. No mass or hydronephrosis visualized. Left Kidney: Length: 10.3 cm. Echogenicity within normal limits. No mass or hydronephrosis visualized. Abdominal aorta: No aneurysm visualized. Other findings: Mild ascites is present, largely perihepatic in nature, similar to prior CT examination. IMPRESSION: 1. Advanced cirrhosis. No focal intrahepatic mass identified. 2. Moderate splenomegaly and mild ascites, similar to prior examination in keeping with changes of portal venous hypertension. 3. Marked gallbladder wall thickening, nonspecific in the  setting of cirrhosis and ascites. No intraluminal stones or sludge identified. No definite sonographic evidence of acute cholecystitis. Electronically Signed   By: Helyn Numbers M.D.   On: 05/08/2023 00:32     Discharge Exam: Vitals:   05/25/23 2028 05/26/23 0344  BP: 117/66 111/64  Pulse: (!) 49 (!) 49  Resp:  16 14  Temp: 97.8 F (36.6 C) 97.8 F (36.6 C)  SpO2: 100% 100%   Vitals:   05/25/23 1301 05/25/23 1328 05/25/23 2028 05/26/23 0344  BP:  (!) 103/59 117/66 111/64  Pulse:  (!) 50 (!) 49 (!) 49  Resp:  18 16 14   Temp:  98.1 F (36.7 C) 97.8 F (36.6 C) 97.8 F (36.6 C)  TempSrc:  Oral Oral   SpO2:  100% 100% 100%  Weight: 81.7 kg     Height:        General: Pt is alert, awake, not in acute distress Cardiovascular: RRR, S1/S2 +, no rubs, no gallops Respiratory: CTA bilaterally, no wheezing, no rhonchi Abdominal: Soft, NT, ND, bowel sounds + Extremities: no edema, no cyanosis    The results of significant diagnostics from this hospitalization (including imaging, microbiology, ancillary and laboratory) are listed below for reference.     Microbiology: No results found for this or any previous visit (from the past 240 hour(s)).   Labs: BNP (last 3 results) No results for input(s): "BNP" in the last 8760 hours. Basic Metabolic Panel: Recent Labs  Lab 05/22/23 0733 05/22/23 1408 05/23/23 0650 05/23/23 1428 05/24/23 0442 05/24/23 1419 05/25/23 0439 05/26/23 0450  NA 131*  --  126*  --  127*  --  132* 128*  K 6.1*   < > 5.0 4.5 5.3* 5.4* 4.2 4.4  CL 108  --  107  --  107  --  110 110  CO2 16*  --  12*  --  13*  --  13* 10*  GLUCOSE 151*  --  161*  --  131*  --  123* 136*  BUN 71*  --  81*  --  78*  --  74* 61*  CREATININE 4.31*  --  4.83*  --  4.43*  --  3.58* 3.24*  CALCIUM 7.9*  --  7.8*  --  7.8*  --  8.2* 8.4*  PHOS  --   --   --   --  5.9*  --   --   --    < > = values in this interval not displayed.   Liver Function Tests: Recent Labs  Lab 05/20/23 1534 05/21/23 0532 05/24/23 0442  AST 40 38  --   ALT 18 18  --   ALKPHOS 113 100  --   BILITOT 2.7* 3.0*  --   PROT 6.6 6.7  --   ALBUMIN 2.3* 2.5* 2.6*   Recent Labs  Lab 05/20/23 1534  LIPASE 438*   Recent Labs  Lab 05/20/23 2330  AMMONIA 34   CBC: Recent Labs  Lab 05/20/23 1534  05/20/23 2330 05/21/23 0532 05/22/23 0733 05/22/23 1938 05/23/23 0243 05/23/23 1428 05/24/23 0857 05/25/23 0439  WBC 4.0  --  6.2 6.4  --  4.5  --  2.2* 2.1*  NEUTROABS 3.0  --   --  4.4  --  3.1  --  1.4* 1.4*  HGB 8.3*   < > 7.4* 5.9* 6.6* 7.0* 7.9* 7.9* 7.5*  HCT 25.7*   < > 22.5* 18.6* 20.2* 20.8* 24.6* 23.1* 22.7*  MCV 99.2  --  97.8 101.1*  --  97.7  --  95.1 97.4  PLT 87*  --  91* 80*  --  56*  --  55* 57*   < > = values in this interval not displayed.   Cardiac Enzymes: No results for input(s): "CKTOTAL", "CKMB", "CKMBINDEX", "TROPONINI" in the last 168 hours. BNP: Invalid input(s): "POCBNP" CBG: Recent Labs  Lab 05/25/23 1607 05/25/23 2030 05/26/23 0012 05/26/23 0343 05/26/23 0722  GLUCAP 169* 81 135* 101* 118*   D-Dimer No results for input(s): "DDIMER" in the last 72 hours. Hgb A1c No results for input(s): "HGBA1C" in the last 72 hours. Lipid Profile No results for input(s): "CHOL", "HDL", "LDLCALC", "TRIG", "CHOLHDL", "LDLDIRECT" in the last 72 hours. Thyroid function studies No results for input(s): "TSH", "T4TOTAL", "T3FREE", "THYROIDAB" in the last 72 hours.  Invalid input(s): "FREET3" Anemia work up No results for input(s): "VITAMINB12", "FOLATE", "FERRITIN", "TIBC", "IRON", "RETICCTPCT" in the last 72 hours. Urinalysis    Component Value Date/Time   COLORURINE YELLOW 05/20/2023 2022   APPEARANCEUR CLEAR 05/20/2023 2022   LABSPEC 1.035 (H) 05/20/2023 2022   PHURINE 5.0 05/20/2023 2022   GLUCOSEU >=500 (A) 05/20/2023 2022   HGBUR NEGATIVE 05/20/2023 2022   BILIRUBINUR NEGATIVE 05/20/2023 2022   BILIRUBINUR 2+ 05/17/2015 1013   KETONESUR NEGATIVE 05/20/2023 2022   PROTEINUR NEGATIVE 05/20/2023 2022   UROBILINOGEN 0.2 05/17/2015 1013   NITRITE NEGATIVE 05/20/2023 2022   LEUKOCYTESUR NEGATIVE 05/20/2023 2022   Sepsis Labs Recent Labs  Lab 05/22/23 0733 05/23/23 0243 05/24/23 0857 05/25/23 0439  WBC 6.4 4.5 2.2* 2.1*   Microbiology No  results found for this or any previous visit (from the past 240 hour(s)).  FURTHER DISCHARGE INSTRUCTIONS:   Get Medicines reviewed and adjusted: Please take all your medications with you for your next visit with your Primary MD   Laboratory/radiological data: Please request your Primary MD to go over all hospital tests and procedure/radiological results at the follow up, please ask your Primary MD to get all Hospital records sent to his/her office.   In some cases, they will be blood work, cultures and biopsy results pending at the time of your discharge. Please request that your primary care M.D. goes through all the records of your hospital data and follows up on these results.   Also Note the following: If you experience worsening of your admission symptoms, develop shortness of breath, life threatening emergency, suicidal or homicidal thoughts you must seek medical attention immediately by calling 911 or calling your MD immediately  if symptoms less severe.   You must read complete instructions/literature along with all the possible adverse reactions/side effects for all the Medicines you take and that have been prescribed to you. Take any new Medicines after you have completely understood and accpet all the possible adverse reactions/side effects.    Do not drive when taking Pain medications or sleeping medications (Benzodaizepines)   Do not take more than prescribed Pain, Sleep and Anxiety Medications. It is not advisable to combine anxiety,sleep and pain medications without talking with your primary care practitioner   Special Instructions: If you have smoked or chewed Tobacco  in the last 2 yrs please stop smoking, stop any regular Alcohol  and or any Recreational drug use.   Wear Seat belts while driving.   Please note: You were cared for by a hospitalist during your hospital stay. Once you are discharged, your primary care physician will handle any further medical issues. Please  note that NO REFILLS  for any discharge medications will be authorized once you are discharged, as it is imperative that you return to your primary care physician (or establish a relationship with a primary care physician if you do not have one) for your post hospital discharge needs so that they can reassess your need for medications and monitor your lab values  Time coordinating discharge: Over 30 minutes  SIGNED:   Hughie Closs, MD  [facility] 05/26/2023, 10:57 AM *Please note that this is a verbal dictation therefore any spelling or grammatical errors are due to the "Dragon [employer] One" system interpretation. If 7PM-7AM, please contact night-coverage [email or web address]

## 2023-05-26 NOTE — Plan of Care (Signed)

## 2023-05-27 ENCOUNTER — Telehealth: Payer: Self-pay

## 2023-05-27 NOTE — Transitions of Care (Post Inpatient/ED Visit) (Signed)
   05/27/2023  Name: Martin Anthony MRN: [id number] DOB: 1965-12-24  Today's TOC FU Call Status: Today's TOC FU Call Status:: Unsuccessful Call (2nd Attempt) Unsuccessful Call (2nd Attempt) Date: 05/27/23 (513 pm.)  Attempted to reach the patient regarding the most recent Inpatient/ED visit.   Follow Up Plan: Additional outreach attempts will be made to reach the patient to complete the Transitions of Care (Post Inpatient/ED visit) call.    Gabriel Cirri MSN, RN [place]  [facility] Management Coordinator [email or web address] Direct Dial: [phone] Fax: [phone]

## 2023-05-27 NOTE — Transitions of Care (Post Inpatient/ED Visit) (Signed)
   05/27/2023  Name: Martin Anthony MRN: [id number] DOB: Sep 17, 1965  Today's TOC FU Call Status: Today's TOC FU Call Status:: Unsuccessful Call (1st Attempt) Unsuccessful Call (1st Attempt) Date: 05/27/23  Attempted to reach the patient regarding the most recent Inpatient/ED visit.  Follow Up Plan: Additional outreach attempts will be made to reach the patient to complete the Transitions of Care (Post Inpatient/ED visit) call.     Antionette Fairy, RN,BSN,CCM RN Care Manager Transitions of Care  [place]  Direct Phone: [phone] Toll Free: [phone] Fax: [phone]

## 2023-05-28 ENCOUNTER — Telehealth: Payer: Self-pay

## 2023-05-28 NOTE — Transitions of Care (Post Inpatient/ED Visit) (Signed)
   05/28/2023  Name: Martin Anthony MRN: [id number] DOB: 07-17-1965  Today's TOC FU Call Status: Today's TOC FU Call Status:: Unsuccessful Call (3rd Attempt) Unsuccessful Call (3rd Attempt) Date: 05/28/23   Attempted to reach the patient regarding the most recent Inpatient/ED visit.   Follow Up Plan: No further outreach attempts will be made at this time. We have been unable to contact the patient.    Antionette Fairy, RN,BSN,CCM RN Care Manager Transitions of Care  [place]  Direct Phone: [phone] Toll Free: [phone] Fax: [phone]

## 2023-06-05 ENCOUNTER — Other Ambulatory Visit (HOSPITAL_COMMUNITY): Payer: Self-pay

## 2023-06-05 DIAGNOSIS — K219 Gastro-esophageal reflux disease without esophagitis: Secondary | ICD-10-CM | POA: Diagnosis present

## 2023-06-05 DIAGNOSIS — Z886 Allergy status to analgesic agent status: Secondary | ICD-10-CM

## 2023-06-05 DIAGNOSIS — E1122 Type 2 diabetes mellitus with diabetic chronic kidney disease: Secondary | ICD-10-CM | POA: Diagnosis present

## 2023-06-05 DIAGNOSIS — Z9049 Acquired absence of other specified parts of digestive tract: Secondary | ICD-10-CM

## 2023-06-05 DIAGNOSIS — Z833 Family history of diabetes mellitus: Secondary | ICD-10-CM

## 2023-06-05 DIAGNOSIS — I480 Paroxysmal atrial fibrillation: Secondary | ICD-10-CM | POA: Diagnosis present

## 2023-06-05 DIAGNOSIS — N1832 Chronic kidney disease, stage 3b: Secondary | ICD-10-CM | POA: Diagnosis present

## 2023-06-05 DIAGNOSIS — N179 Acute kidney failure, unspecified: Secondary | ICD-10-CM | POA: Diagnosis present

## 2023-06-05 DIAGNOSIS — E16A1 Hypoglycemia level 1: Secondary | ICD-10-CM | POA: Diagnosis not present

## 2023-06-05 DIAGNOSIS — Z8249 Family history of ischemic heart disease and other diseases of the circulatory system: Secondary | ICD-10-CM

## 2023-06-05 DIAGNOSIS — F419 Anxiety disorder, unspecified: Secondary | ICD-10-CM | POA: Diagnosis present

## 2023-06-05 DIAGNOSIS — K859 Acute pancreatitis without necrosis or infection, unspecified: Principal | ICD-10-CM | POA: Diagnosis present

## 2023-06-05 DIAGNOSIS — E875 Hyperkalemia: Secondary | ICD-10-CM | POA: Diagnosis present

## 2023-06-05 DIAGNOSIS — K861 Other chronic pancreatitis: Secondary | ICD-10-CM | POA: Diagnosis present

## 2023-06-05 DIAGNOSIS — I129 Hypertensive chronic kidney disease with stage 1 through stage 4 chronic kidney disease, or unspecified chronic kidney disease: Secondary | ICD-10-CM | POA: Diagnosis present

## 2023-06-05 DIAGNOSIS — N200 Calculus of kidney: Secondary | ICD-10-CM | POA: Diagnosis present

## 2023-06-05 DIAGNOSIS — D638 Anemia in other chronic diseases classified elsewhere: Secondary | ICD-10-CM | POA: Diagnosis present

## 2023-06-05 DIAGNOSIS — K766 Portal hypertension: Secondary | ICD-10-CM | POA: Diagnosis present

## 2023-06-05 DIAGNOSIS — D62 Acute posthemorrhagic anemia: Secondary | ICD-10-CM | POA: Diagnosis present

## 2023-06-05 DIAGNOSIS — F1020 Alcohol dependence, uncomplicated: Secondary | ICD-10-CM | POA: Diagnosis present

## 2023-06-05 DIAGNOSIS — Z79899 Other long term (current) drug therapy: Secondary | ICD-10-CM

## 2023-06-05 DIAGNOSIS — D61818 Other pancytopenia: Secondary | ICD-10-CM | POA: Diagnosis present

## 2023-06-05 DIAGNOSIS — E1165 Type 2 diabetes mellitus with hyperglycemia: Secondary | ICD-10-CM | POA: Diagnosis present

## 2023-06-05 DIAGNOSIS — F17200 Nicotine dependence, unspecified, uncomplicated: Secondary | ICD-10-CM | POA: Diagnosis present

## 2023-06-05 DIAGNOSIS — E785 Hyperlipidemia, unspecified: Secondary | ICD-10-CM | POA: Diagnosis present

## 2023-06-05 DIAGNOSIS — E11649 Type 2 diabetes mellitus with hypoglycemia without coma: Secondary | ICD-10-CM | POA: Diagnosis not present

## 2023-06-05 DIAGNOSIS — Z794 Long term (current) use of insulin: Secondary | ICD-10-CM

## 2023-06-05 DIAGNOSIS — Z85038 Personal history of other malignant neoplasm of large intestine: Secondary | ICD-10-CM

## 2023-06-05 DIAGNOSIS — F32A Depression, unspecified: Secondary | ICD-10-CM | POA: Diagnosis present

## 2023-06-05 DIAGNOSIS — K7031 Alcoholic cirrhosis of liver with ascites: Secondary | ICD-10-CM | POA: Diagnosis present

## 2023-06-05 DIAGNOSIS — K769 Liver disease, unspecified: Secondary | ICD-10-CM | POA: Diagnosis present

## 2023-06-05 DIAGNOSIS — E860 Dehydration: Secondary | ICD-10-CM | POA: Diagnosis present

## 2023-06-05 DIAGNOSIS — E871 Hypo-osmolality and hyponatremia: Secondary | ICD-10-CM | POA: Diagnosis present

## 2023-06-05 DIAGNOSIS — Z23 Encounter for immunization: Secondary | ICD-10-CM

## 2023-06-05 DIAGNOSIS — K2970 Gastritis, unspecified, without bleeding: Secondary | ICD-10-CM | POA: Diagnosis present

## 2023-06-06 ENCOUNTER — Inpatient Hospital Stay (HOSPITAL_COMMUNITY): Payer: Medicaid Other

## 2023-06-06 ENCOUNTER — Inpatient Hospital Stay (HOSPITAL_COMMUNITY)
Admission: EM | Admit: 2023-06-06 | Discharge: 2023-06-12 | DRG: 439 | Disposition: A | Discharge status: Home / Self Care | Payer: Medicaid Other | Attending: Internal Medicine | Admitting: Internal Medicine

## 2023-06-06 ENCOUNTER — Emergency Department (HOSPITAL_COMMUNITY): Payer: Medicaid Other

## 2023-06-06 ENCOUNTER — Encounter (HOSPITAL_COMMUNITY): Payer: Self-pay

## 2023-06-06 ENCOUNTER — Other Ambulatory Visit: Payer: Self-pay

## 2023-06-06 DIAGNOSIS — K859 Acute pancreatitis without necrosis or infection, unspecified: Principal | ICD-10-CM | POA: Diagnosis present

## 2023-06-06 DIAGNOSIS — F1020 Alcohol dependence, uncomplicated: Secondary | ICD-10-CM | POA: Diagnosis present

## 2023-06-06 DIAGNOSIS — Z23 Encounter for immunization: Secondary | ICD-10-CM | POA: Diagnosis not present

## 2023-06-06 DIAGNOSIS — E1149 Type 2 diabetes mellitus with other diabetic neurological complication: Secondary | ICD-10-CM | POA: Diagnosis not present

## 2023-06-06 DIAGNOSIS — E785 Hyperlipidemia, unspecified: Secondary | ICD-10-CM | POA: Diagnosis present

## 2023-06-06 DIAGNOSIS — F32A Depression, unspecified: Secondary | ICD-10-CM | POA: Diagnosis present

## 2023-06-06 DIAGNOSIS — R739 Hyperglycemia, unspecified: Secondary | ICD-10-CM

## 2023-06-06 DIAGNOSIS — K861 Other chronic pancreatitis: Secondary | ICD-10-CM | POA: Diagnosis present

## 2023-06-06 DIAGNOSIS — I129 Hypertensive chronic kidney disease with stage 1 through stage 4 chronic kidney disease, or unspecified chronic kidney disease: Secondary | ICD-10-CM | POA: Diagnosis present

## 2023-06-06 DIAGNOSIS — K852 Alcohol induced acute pancreatitis without necrosis or infection: Secondary | ICD-10-CM

## 2023-06-06 DIAGNOSIS — E1165 Type 2 diabetes mellitus with hyperglycemia: Secondary | ICD-10-CM | POA: Diagnosis present

## 2023-06-06 DIAGNOSIS — K766 Portal hypertension: Secondary | ICD-10-CM | POA: Diagnosis present

## 2023-06-06 DIAGNOSIS — I480 Paroxysmal atrial fibrillation: Secondary | ICD-10-CM | POA: Diagnosis present

## 2023-06-06 DIAGNOSIS — E16A1 Hypoglycemia level 1: Secondary | ICD-10-CM | POA: Diagnosis not present

## 2023-06-06 DIAGNOSIS — N1832 Chronic kidney disease, stage 3b: Secondary | ICD-10-CM | POA: Diagnosis present

## 2023-06-06 DIAGNOSIS — E871 Hypo-osmolality and hyponatremia: Secondary | ICD-10-CM | POA: Diagnosis present

## 2023-06-06 DIAGNOSIS — D61818 Other pancytopenia: Secondary | ICD-10-CM | POA: Diagnosis present

## 2023-06-06 DIAGNOSIS — D696 Thrombocytopenia, unspecified: Secondary | ICD-10-CM | POA: Diagnosis present

## 2023-06-06 DIAGNOSIS — F418 Other specified anxiety disorders: Secondary | ICD-10-CM | POA: Diagnosis not present

## 2023-06-06 DIAGNOSIS — E875 Hyperkalemia: Secondary | ICD-10-CM | POA: Diagnosis present

## 2023-06-06 DIAGNOSIS — E1122 Type 2 diabetes mellitus with diabetic chronic kidney disease: Secondary | ICD-10-CM | POA: Diagnosis present

## 2023-06-06 DIAGNOSIS — D638 Anemia in other chronic diseases classified elsewhere: Secondary | ICD-10-CM | POA: Diagnosis present

## 2023-06-06 DIAGNOSIS — Z794 Long term (current) use of insulin: Secondary | ICD-10-CM | POA: Diagnosis not present

## 2023-06-06 DIAGNOSIS — R7989 Other specified abnormal findings of blood chemistry: Secondary | ICD-10-CM | POA: Diagnosis present

## 2023-06-06 DIAGNOSIS — F419 Anxiety disorder, unspecified: Secondary | ICD-10-CM | POA: Diagnosis present

## 2023-06-06 DIAGNOSIS — E860 Dehydration: Secondary | ICD-10-CM | POA: Diagnosis present

## 2023-06-06 DIAGNOSIS — E11649 Type 2 diabetes mellitus with hypoglycemia without coma: Secondary | ICD-10-CM | POA: Diagnosis not present

## 2023-06-06 DIAGNOSIS — D62 Acute posthemorrhagic anemia: Secondary | ICD-10-CM | POA: Diagnosis present

## 2023-06-06 DIAGNOSIS — N179 Acute kidney failure, unspecified: Secondary | ICD-10-CM | POA: Diagnosis present

## 2023-06-06 DIAGNOSIS — K7031 Alcoholic cirrhosis of liver with ascites: Secondary | ICD-10-CM | POA: Diagnosis present

## 2023-06-06 LAB — BLOOD GAS, VENOUS
Acid-base deficit: 10.6 mmol/L — ABNORMAL HIGH (ref 0.0–2.0)
Bicarbonate: 13.9 mmol/L — ABNORMAL LOW (ref 20.0–28.0)
O2 Saturation: 69.3 %
Patient temperature: 36.1
pCO2, Ven: 26 mm[Hg] — ABNORMAL LOW (ref 44–60)
pH, Ven: 7.33 (ref 7.25–7.43)
pO2, Ven: 37 mm[Hg] (ref 32–45)

## 2023-06-06 LAB — URINALYSIS, W/ REFLEX TO CULTURE (INFECTION SUSPECTED)
Bacteria, UA: NONE SEEN
Bilirubin Urine: NEGATIVE
Glucose, UA: >=500 mg/dL — AB
Ketones, ur: NEGATIVE mg/dL
Leukocytes,Ua: NEGATIVE
Nitrite: NEGATIVE
Protein, ur: NEGATIVE mg/dL
Specific Gravity, Urine: 1.016 (ref 1.005–1.030)
pH: 5 (ref 5.0–8.0)

## 2023-06-06 LAB — BASIC METABOLIC PANEL
Anion gap: 6 (ref 5–15)
BUN: 42 mg/dL — ABNORMAL HIGH (ref 6–20)
CO2: 16 mmol/L — ABNORMAL LOW (ref 22–32)
Calcium: 8.3 mg/dL — ABNORMAL LOW (ref 8.9–10.3)
Chloride: 105 mmol/L (ref 98–111)
Creatinine, Ser: 2.29 mg/dL — ABNORMAL HIGH (ref 0.61–1.24)
GFR, Estimated: 33 mL/min — ABNORMAL LOW (ref >60)
Glucose, Bld: 170 mg/dL — ABNORMAL HIGH (ref 70–99)
Potassium: 4.5 mmol/L (ref 3.5–5.1)
Sodium: 127 mmol/L — ABNORMAL LOW (ref 135–145)

## 2023-06-06 LAB — CBG MONITORING, ED
Glucose-Capillary: 135 mg/dL — ABNORMAL HIGH (ref 70–99)
Glucose-Capillary: 276 mg/dL — ABNORMAL HIGH (ref 70–99)
Glucose-Capillary: 411 mg/dL — ABNORMAL HIGH (ref 70–99)
Glucose-Capillary: 567 mg/dL (ref 70–99)

## 2023-06-06 LAB — CBC WITH DIFFERENTIAL/PLATELET
Abs Immature Granulocytes: 0.03 10*3/uL (ref 0.00–0.07)
Basophils Absolute: 0 10*3/uL (ref 0.0–0.1)
Basophils Relative: 0 %
Eosinophils Absolute: 0.1 10*3/uL (ref 0.0–0.5)
Eosinophils Relative: 2 %
HCT: 23.4 % — ABNORMAL LOW (ref 39.0–52.0)
Hemoglobin: 8 g/dL — ABNORMAL LOW (ref 13.0–17.0)
Immature Granulocytes: 1 %
Lymphocytes Relative: 6 %
Lymphs Abs: 0.2 10*3/uL — ABNORMAL LOW (ref 0.7–4.0)
MCH: 32.7 pg (ref 26.0–34.0)
MCHC: 34.2 g/dL (ref 30.0–36.0)
MCV: 95.5 fL (ref 80.0–100.0)
Monocytes Absolute: 0.4 10*3/uL (ref 0.1–1.0)
Monocytes Relative: 9 %
Neutro Abs: 3.3 10*3/uL (ref 1.7–7.7)
Neutrophils Relative %: 82 %
Platelets: 49 10*3/uL — ABNORMAL LOW (ref 150–400)
RBC: 2.45 MIL/uL — ABNORMAL LOW (ref 4.22–5.81)
RDW: 15.8 % — ABNORMAL HIGH (ref 11.5–15.5)
WBC: 4 10*3/uL (ref 4.0–10.5)
nRBC: 0 % (ref 0.0–0.2)

## 2023-06-06 LAB — GLUCOSE, CAPILLARY
Glucose-Capillary: 169 mg/dL — ABNORMAL HIGH (ref 70–99)
Glucose-Capillary: 151 mg/dL — ABNORMAL HIGH (ref 70–99)

## 2023-06-06 LAB — COMPREHENSIVE METABOLIC PANEL
ALT: 20 U/L (ref 0–44)
AST: 35 U/L (ref 15–41)
Albumin: 3.3 g/dL — ABNORMAL LOW (ref 3.5–5.0)
Alkaline Phosphatase: 118 U/L (ref 38–126)
Anion gap: 8 (ref 5–15)
BUN: 52 mg/dL — ABNORMAL HIGH (ref 6–20)
CO2: 15 mmol/L — ABNORMAL LOW (ref 22–32)
Calcium: 8.8 mg/dL — ABNORMAL LOW (ref 8.9–10.3)
Chloride: 98 mmol/L (ref 98–111)
Creatinine, Ser: 2.94 mg/dL — ABNORMAL HIGH (ref 0.61–1.24)
GFR, Estimated: 24 mL/min — ABNORMAL LOW (ref >60)
Glucose, Bld: 686 mg/dL (ref 70–99)
Potassium: 5.5 mmol/L — ABNORMAL HIGH (ref 3.5–5.1)
Sodium: 121 mmol/L — ABNORMAL LOW (ref 135–145)
Total Bilirubin: 3.5 mg/dL — ABNORMAL HIGH (ref <1.2)
Total Protein: 7.4 g/dL (ref 6.5–8.1)

## 2023-06-06 LAB — PROTIME-INR
INR: 1.6 — ABNORMAL HIGH (ref 0.8–1.2)
Prothrombin Time: 19.5 s — ABNORMAL HIGH (ref 11.4–15.2)

## 2023-06-06 LAB — TRIGLYCERIDES: Triglycerides: 53 mg/dL (ref <150)

## 2023-06-06 LAB — ETHANOL: Alcohol, Ethyl (B): <10 mg/dL (ref <10)

## 2023-06-06 LAB — LIPASE, BLOOD: Lipase: 4907 U/L — ABNORMAL HIGH (ref 11–51)

## 2023-06-06 MED ORDER — SODIUM CHLORIDE 0.9 % IV SOLN
1000.0000 mL | INTRAVENOUS | Status: DC
Start: 2023-06-06 — End: 2023-06-09
  Administered 2023-06-06 – 2023-06-09 (×9): 1000 mL via INTRAVENOUS

## 2023-06-06 MED ORDER — INSULIN ASPART 100 UNIT/ML IJ SOLN
0.0000 [IU] | INTRAMUSCULAR | Status: DC
  Administered 2023-06-06: 12 [IU] via SUBCUTANEOUS
  Administered 2023-06-06: 2 [IU] via SUBCUTANEOUS
  Administered 2023-06-06: 20 [IU] via SUBCUTANEOUS
  Administered 2023-06-06: 4 [IU] via SUBCUTANEOUS
  Administered 2023-06-06: 2 [IU] via SUBCUTANEOUS
  Administered 2023-06-07: 12 [IU] via SUBCUTANEOUS
  Administered 2023-06-07: 2 [IU] via SUBCUTANEOUS
  Filled 2023-06-06: qty 0.24

## 2023-06-06 MED ORDER — INSULIN ASPART 100 UNIT/ML IJ SOLN
8.0000 [IU] | Freq: Once | INTRAMUSCULAR | Status: AC
  Administered 2023-06-06: 8 [IU] via SUBCUTANEOUS
  Filled 2023-06-06: qty 0.08

## 2023-06-06 MED ORDER — SODIUM CHLORIDE 0.9 % IV BOLUS
1000.0000 mL | Freq: Once | INTRAVENOUS | Status: AC
  Administered 2023-06-06: 1000 mL via INTRAVENOUS

## 2023-06-06 MED ORDER — GABAPENTIN 300 MG PO CAPS
300.0000 mg | ORAL_CAPSULE | Freq: Every day | ORAL | Status: DC
  Administered 2023-06-06 – 2023-06-11 (×6): 300 mg via ORAL
  Filled 2023-06-06 (×6): qty 1

## 2023-06-06 MED ORDER — INSULIN DETEMIR 100 UNIT/ML ~~LOC~~ SOLN
15.0000 [IU] | Freq: Every day | SUBCUTANEOUS | Status: DC
  Administered 2023-06-06: 15 [IU] via SUBCUTANEOUS
  Filled 2023-06-06 (×2): qty 0.15

## 2023-06-06 MED ORDER — ONDANSETRON HCL 4 MG/2ML IJ SOLN: 4.0000 mg | Freq: Four times a day (QID) | INTRAMUSCULAR | Status: DC | PRN

## 2023-06-06 MED ORDER — ACETAMINOPHEN 650 MG RE SUPP: 650.0000 mg | Freq: Four times a day (QID) | RECTAL | Status: DC | PRN

## 2023-06-06 MED ORDER — ONDANSETRON HCL 4 MG PO TABS: 4.0000 mg | ORAL_TABLET | Freq: Four times a day (QID) | ORAL | Status: DC | PRN

## 2023-06-06 MED ORDER — ENOXAPARIN SODIUM 40 MG/0.4ML IJ SOSY: 40.0000 mg | PREFILLED_SYRINGE | INTRAMUSCULAR | Status: DC

## 2023-06-06 MED ORDER — GADOBUTROL 1 MMOL/ML IV SOLN
7.0000 mL | Freq: Once | INTRAVENOUS | Status: AC | PRN
  Administered 2023-06-06: 7 mL via INTRAVENOUS

## 2023-06-06 MED ORDER — HYDROMORPHONE HCL 1 MG/ML IJ SOLN
1.0000 mg | INTRAMUSCULAR | Status: AC | PRN
  Administered 2023-06-06: 1 mg via INTRAVENOUS
  Filled 2023-06-06: qty 1

## 2023-06-06 MED ORDER — PANTOPRAZOLE SODIUM 40 MG PO TBEC
40.0000 mg | DELAYED_RELEASE_TABLET | Freq: Every day | ORAL | Status: DC
  Administered 2023-06-06: 40 mg via ORAL
  Filled 2023-06-06: qty 1

## 2023-06-06 MED ORDER — GABAPENTIN 100 MG PO CAPS
100.0000 mg | ORAL_CAPSULE | Freq: Two times a day (BID) | ORAL | Status: DC
Start: 2023-06-06 — End: 2023-06-12
  Administered 2023-06-06 – 2023-06-12 (×13): 100 mg via ORAL
  Filled 2023-06-06 (×13): qty 1

## 2023-06-06 MED ORDER — HEPARIN SODIUM (PORCINE) 5000 UNIT/ML IJ SOLN
5000.0000 [IU] | Freq: Three times a day (TID) | INTRAMUSCULAR | Status: DC
  Administered 2023-06-06: 5000 [IU] via SUBCUTANEOUS
  Filled 2023-06-06: qty 1

## 2023-06-06 MED ORDER — ACETAMINOPHEN 325 MG PO TABS: 650.0000 mg | ORAL_TABLET | Freq: Four times a day (QID) | ORAL | Status: DC | PRN

## 2023-06-06 MED ORDER — HYDROMORPHONE HCL 1 MG/ML IJ SOLN
0.5000 mg | INTRAMUSCULAR | Status: DC | PRN
  Administered 2023-06-06 (×2): 0.5 mg via INTRAVENOUS
  Filled 2023-06-06 (×2): qty 1

## 2023-06-06 MED ORDER — SERTRALINE HCL 50 MG PO TABS
50.0000 mg | ORAL_TABLET | Freq: Every day | ORAL | Status: DC
  Administered 2023-06-06 – 2023-06-12 (×7): 50 mg via ORAL
  Filled 2023-06-06 (×2): qty 2
  Filled 2023-06-06: qty 1
  Filled 2023-06-06: qty 2
  Filled 2023-06-06: qty 1
  Filled 2023-06-06 (×2): qty 2

## 2023-06-06 MED ORDER — SPIRONOLACTONE 100 MG PO TABS: 100.0000 mg | ORAL_TABLET | Freq: Every day | ORAL | Status: DC

## 2023-06-06 MED ORDER — BOOST / RESOURCE BREEZE PO LIQD CUSTOM
1.0000 | Freq: Three times a day (TID) | ORAL | Status: DC
  Administered 2023-06-06 – 2023-06-12 (×8): 1 via ORAL

## 2023-06-06 MED ORDER — LACTATED RINGERS IV SOLN: INTRAVENOUS | Status: AC

## 2023-06-06 MED ORDER — FUROSEMIDE 40 MG PO TABS: 20.0000 mg | ORAL_TABLET | Freq: Every day | ORAL | Status: DC

## 2023-06-06 MED ORDER — OXYCODONE HCL 5 MG PO TABS
5.0000 mg | ORAL_TABLET | ORAL | Status: DC | PRN
  Administered 2023-06-06 – 2023-06-12 (×21): 5 mg via ORAL
  Filled 2023-06-06 (×23): qty 1

## 2023-06-06 MED ORDER — SODIUM CHLORIDE 0.9 % IV BOLUS (SEPSIS)
1000.0000 mL | Freq: Once | INTRAVENOUS | Status: AC
  Administered 2023-06-06: 1000 mL via INTRAVENOUS

## 2023-06-06 MED ORDER — HYDROMORPHONE HCL 1 MG/ML IJ SOLN
0.5000 mg | Freq: Four times a day (QID) | INTRAMUSCULAR | Status: DC | PRN
  Administered 2023-06-06 – 2023-06-11 (×20): 0.5 mg via INTRAVENOUS
  Filled 2023-06-06 (×6): qty 0.5
  Filled 2023-06-06: qty 1
  Filled 2023-06-06 (×13): qty 0.5

## 2023-06-06 MED ORDER — PNEUMOCOCCAL 20-VAL CONJ VACC 0.5 ML IM SUSY
0.5000 mL | PREFILLED_SYRINGE | INTRAMUSCULAR | Status: AC
  Administered 2023-06-09: 0.5 mL via INTRAMUSCULAR
  Filled 2023-06-06 (×2): qty 0.5

## 2023-06-06 MED ORDER — MIDODRINE HCL 5 MG PO TABS
5.0000 mg | ORAL_TABLET | Freq: Three times a day (TID) | ORAL | Status: DC
Start: 2023-06-06 — End: 2023-06-12
  Administered 2023-06-06 – 2023-06-11 (×18): 5 mg via ORAL
  Filled 2023-06-06 (×18): qty 1

## 2023-06-06 MED ORDER — ONDANSETRON HCL 4 MG/2ML IJ SOLN
4.0000 mg | Freq: Once | INTRAMUSCULAR | Status: AC
  Administered 2023-06-06: 4 mg via INTRAVENOUS
  Filled 2023-06-06: qty 2

## 2023-06-06 MED ORDER — FENTANYL CITRATE PF 50 MCG/ML IJ SOSY
12.5000 ug | PREFILLED_SYRINGE | INTRAMUSCULAR | Status: DC | PRN
  Administered 2023-06-06 (×2): 50 ug via INTRAVENOUS
  Filled 2023-06-06 (×2): qty 1

## 2023-06-06 MED ORDER — RIFAXIMIN 550 MG PO TABS
550.0000 mg | ORAL_TABLET | Freq: Two times a day (BID) | ORAL | Status: DC
  Administered 2023-06-06 – 2023-06-12 (×13): 550 mg via ORAL
  Filled 2023-06-06 (×14): qty 1

## 2023-06-06 NOTE — ED Provider Notes (Signed)
[facility] AT [facility] Provider Note  CSN: [id number] Arrival date & time: 06/05/23 2350  Chief Complaint(s) Abdominal Pain  HPI Martin Anthony is a 57 y.o. male with a past medical history listed below including alcohol related cirrhosis and pancreatitis, type 2 diabetes, colon cancer status post colectomy requiring ostomy here for mid and epigastric abdominal pain that began 3 to 4 days ago and gradually worsened.  Associated nausea without emesis.  Patient denies any recent alcohol or substance use.  Patient was told that his skin looks more yellow than normal over the past several days.  He reports decreased oral tolerance over the past several days.  The history is provided by the patient.    Past Medical History Past Medical History:  Diagnosis Date   Anxiety    Ascites due to alcoholic cirrhosis (HCC)    Cirrhosis with alcoholism (HCC)    Depression    Diabetes mellitus without complication (HCC)    GERD (gastroesophageal reflux disease)    Gout    Hyperlipidemia    Hypertension    Pancreatitis    Primary adenocarcinoma of ascending colon (HCC)    Substance abuse (HCC)    alcoholism    Type 2 diabetes mellitus with diabetic neuropathy (HCC)    Patient Active Problem List   Diagnosis Date Noted   GIB (gastrointestinal bleeding) 05/21/2023   Duodenitis with bleeding 05/20/2023   Malnutrition of moderate degree 05/12/2023   PAF (paroxysmal atrial fibrillation) (HCC) 05/07/2023   Abdominal pain in male 05/07/2023   Protein calorie malnutrition (HCC) 05/07/2023   Abdominal pain 04/05/2023   Liver mass 03/31/2023   Hyponatremia 03/31/2023   Streptococcal bacteremia 03/30/2023   Alcohol withdrawal delirium (HCC) 03/30/2023   Thrombocytopenia (HCC) 03/30/2023   Shock (HCC) 03/30/2023   Esophageal varices with bleeding (HCC) 03/23/2023   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   Alcoholic cirrhosis of liver with ascites (HCC)  12/05/2021   Type 2 diabetes mellitus with neurological complications (HCC) 12/05/2021   Primary adenocarcinoma of ascending colon (HCC) 12/05/2021   CKD (chronic kidney disease) stage 3, GFR 30-59 ml/min (HCC) 12/05/2021   Pancytopenia (HCC) 12/05/2021   Right inguinal hernia 12/05/2021   Acute renal failure superimposed on stage 2 chronic kidney disease (HCC) 05/12/2021   Rhabdomyolysis 05/12/2021   Elevated LFTs 05/12/2021   Acute encephalopathy 05/12/2021   Ascites    Anemia 01/18/2019   Alcohol abuse 06/25/2015   Gout 10/03/2013   HYPERLIPIDEMIA 05/20/2007   Depression with anxiety 05/20/2007   Essential hypertension 05/20/2007   GERD 05/20/2007   Home Medication(s) Prior to Admission medications   Medication Sig Start Date End Date Taking? Authorizing Provider  folic acid (FOLVITE) 1 MG tablet Take 1 tablet (1 mg total) by mouth daily. 05/21/21   Leroy Sea, MD  furosemide (LASIX) 20 MG tablet Take 1 tablet (20 mg total) by mouth daily. 04/08/23   Hughie Closs, MD  gabapentin (NEURONTIN) 100 MG capsule Take 1 capsule (100 mg total) by mouth 3 (three) times daily. Patient taking differently: Take 100 mg by mouth 3 (three) times daily. Take in addition to one 300mg  capsule at bedtime for a total daily dose of 600mg  04/08/23 05/19/24  Hughie Closs, MD  gabapentin (NEURONTIN) 300 MG capsule Take 300 mg by mouth at bedtime. Take in addition to 100mg  three times daily for a total daily dose of 600mg     [provider]  Iron, Ferrous Sulfate, 325 (65 Fe) MG  TABS Take 325 mg by mouth daily. Patient taking differently: Take 325 mg by mouth daily with breakfast. 12/18/21   Nelwyn Salisbury, MD  ketoconazole (NIZORAL) 2 % cream Apply 1 Application topically 2 (two) times daily.    [provider]  midodrine (PROAMATINE) 5 MG tablet Take 1 tablet (5 mg total) by mouth 3 (three) times daily with meals. 05/12/23 06/11/23  Lanae Boast, MD  nicotine (NICODERM CQ - DOSED IN  MG/24 HOURS) 14 mg/24hr patch Place 1 patch (14 mg total) onto the skin daily. 04/08/23 04/07/24  Hughie Closs, MD  nystatin (MYCOSTATIN/NYSTOP) powder Apply 1 Application topically 3 (three) times daily. 05/03/23   Nelwyn Salisbury, MD  pantoprazole (PROTONIX) 40 MG tablet Take 1 tablet (40 mg total) by mouth daily. 06/11/23   Lanae Boast, MD  rifaximin (XIFAXAN) 550 MG TABS tablet Take 1 tablet (550 mg total) by mouth 2 (two) times daily. 01/20/22   Nelwyn Salisbury, MD  sertraline (ZOLOFT) 50 MG tablet Take 1 tablet (50 mg total) by mouth daily. 02/02/23   Nelwyn Salisbury, MD  spironolactone (ALDACTONE) 100 MG tablet Take 1 tablet (100 mg total) by mouth daily. 05/13/23 06/12/23  Lanae Boast, MD  sucralfate (CARAFATE) 1 g tablet Take 1 tablet (1 g total) by mouth 4 (four) times daily -  with meals and at bedtime. 05/12/23 06/11/23  Lanae Boast, MD  thiamine 100 MG tablet Take 1 tablet (100 mg total) by mouth daily. 05/21/21   Leroy Sea, MD                                                                                                                                    Allergies Acetaminophen and Ibuprofen  Review of Systems Review of Systems As noted in HPI  Physical Exam Vital Signs  I have reviewed the triage vital signs BP (!) 160/76   Pulse (!) 58   Temp 99.1 F (37.3 C) (Oral)   Resp 18   SpO2 100%   Physical Exam Vitals reviewed.  Constitutional:      General: He is not in acute distress.    Appearance: He is well-developed. He is not diaphoretic.  HENT:     Head: Normocephalic and atraumatic.     Right Ear: External ear normal.     Left Ear: External ear normal.     Nose: Nose normal.     Mouth/Throat:     Mouth: Mucous membranes are moist.  Eyes:     General: Scleral icterus (mild) present.     Conjunctiva/sclera:     Right eye: Hemorrhage present.  Neck:     Trachea: Phonation normal.  Cardiovascular:     Rate and Rhythm: Normal rate and regular rhythm.   Pulmonary:     Effort: Pulmonary effort is normal. No respiratory distress.     Breath sounds: No stridor.  Abdominal:  General: There is no distension.     Tenderness: There is abdominal tenderness in the right upper quadrant, epigastric area, periumbilical area and left upper quadrant.    Musculoskeletal:        General: Normal range of motion.     Cervical back: Normal range of motion.  Skin:    Coloration: Skin is jaundiced (mild).  Neurological:     Mental Status: He is alert and oriented to person, place, and time.  Psychiatric:        Behavior: Behavior normal.     ED Results and Treatments Labs (all labs ordered are listed, but only abnormal results are displayed) Labs Reviewed  COMPREHENSIVE METABOLIC PANEL - Abnormal; Notable for the following components:      Result Value   Sodium 121 (*)    Potassium 5.5 (*)    CO2 15 (*)    Glucose, Bld 686 (*)    BUN 52 (*)    Creatinine, Ser 2.94 (*)    Calcium 8.8 (*)    Albumin 3.3 (*)    Total Bilirubin 3.5 (*)    GFR, Estimated 24 (*)    All other components within normal limits  LIPASE, BLOOD - Abnormal; Notable for the following components:   Lipase 4,907 (*)    All other components within normal limits  CBC WITH DIFFERENTIAL/PLATELET - Abnormal; Notable for the following components:   RBC 2.45 (*)    Hemoglobin 8.0 (*)    HCT 23.4 (*)    RDW 15.8 (*)    Platelets 49 (*)    Lymphs Abs 0.2 (*)    All other components within normal limits  BLOOD GAS, VENOUS - Abnormal; Notable for the following components:   pCO2, Ven 26 (*)    Bicarbonate 13.9 (*)    Acid-base deficit 10.6 (*)    All other components within normal limits  PROTIME-INR - Abnormal; Notable for the following components:   Prothrombin Time 19.5 (*)    INR 1.6 (*)    All other components within normal limits  CBG MONITORING, ED - Abnormal; Notable for the following components:   Glucose-Capillary 567 (*)    All other components within  normal limits  URINALYSIS, W/ REFLEX TO CULTURE (INFECTION SUSPECTED)                                                                                                                         EKG  EKG Interpretation Date/Time:  Sunday June 06 2023 00:31:45 EST Ventricular Rate:  56 PR Interval:  157 QRS Duration:  105 QT Interval:  432 QTC Calculation: 417 R Axis:   65  Text Interpretation: Sinus rhythm Inferior infarct, old No significant change was found Confirmed by Drema Pry [phone]) on 06/06/2023 4:02:58 AM       Radiology CT Renal Stone Study Result Date: 06/06/2023 CLINICAL DATA:  Right upper quadrant pain with intermittent bleeding around and existing ostomy site. EXAM: CT ABDOMEN AND PELVIS  WITHOUT CONTRAST TECHNIQUE: Multidetector CT imaging of the abdomen and pelvis was performed following the standard protocol without IV contrast. RADIATION DOSE REDUCTION: This exam was performed according to the departmental dose-optimization program which includes automated exposure control, adjustment of the mA and/or kV according to patient size and/or use of iterative reconstruction technique. COMPARISON:  May 20, 2023 FINDINGS: Lower chest: No acute abnormality. Hepatobiliary: The liver is cirrhotic in appearance. No focal liver abnormality is seen. No gallstones, gallbladder wall thickening, or biliary dilatation. Pancreas: Mild peripancreatic inflammatory fat stranding is seen. This represents a new finding when compared to the prior study. There is no evidence of pancreatic ductal dilatation Spleen: There is moderate to marked severity splenomegaly. Adrenals/Urinary Tract: Adrenal glands are unremarkable. Kidneys are normal in size, without obstructing renal calculi, focal lesion, or hydronephrosis. A 5 mm nonobstructing renal calculus is seen within the lower pole of the right kidney. The 3 mm nonobstructing renal calculus seen within the left kidney on the prior study is no  longer visualized. Bladder is unremarkable. Stomach/Bowel: The stomach is poorly distended. Moderate to marked severity diffuse gastric wall thickening is noted. Postoperative changes consistent with prior right hemicolectomy are seen with a subsequent right-sided paramidline ileostomy site. No evidence of bowel wall thickening, distention, or inflammatory changes. Vascular/Lymphatic: Aortic atherosclerosis. No enlarged abdominal or pelvic lymph nodes. Reproductive: Prostate is unremarkable. Other: A 2.8 cm x 4.6 cm fluid-filled right inguinal hernia is seen. This is increased in size when compared to the prior study. There is a mild amount of perihepatic free fluid. A small amount of posterior pelvic free fluid is also noted. Musculoskeletal: No acute or significant osseous findings. IMPRESSION: 1. Findings consistent with acute pancreatitis. Correlation with pancreatic enzymes is recommended. 2. Cirrhosis with moderate to marked severity splenomegaly. 3. Moderate to marked severity diffuse gastric wall thickening which may represent gastritis. 4. Postoperative changes consistent with prior right hemicolectomy with a subsequent right-sided paramidline ileostomy site. 5. 2.8 cm x 4.6 cm fluid-filled right inguinal hernia. 6. Mild amount of perihepatic and posterior pelvic free fluid. 7. 5 mm nonobstructing right renal calculus. 8. Aortic atherosclerosis. Aortic Atherosclerosis (ICD10-I70.0). Electronically Signed   By: Aram Candela M.D.   On: 06/06/2023 03:34    Medications Ordered in ED Medications  HYDROmorphone (DILAUDID) injection 0.5 mg (0.5 mg Intravenous Given 06/06/23 0318)  sodium chloride 0.9 % bolus 1,000 mL (has no administration in time range)    Followed by  sodium chloride 0.9 % bolus 1,000 mL (1,000 mLs Intravenous New Bag/Given 06/06/23 0304)    Followed by  0.9 %  sodium chloride infusion (has no administration in time range)  sodium chloride 0.9 % bolus 1,000 mL (1,000 mLs  Intravenous New Bag/Given 06/06/23 0118)  ondansetron (ZOFRAN) injection 4 mg (4 mg Intravenous Given 06/06/23 0118)  insulin aspart (novoLOG) injection 8 Units (8 Units Subcutaneous Given 06/06/23 0247)   Procedures .Critical Care  Performed by: Nira Conn, MD Authorized by: Nira Conn, MD   Critical care provider statement:    Critical care time (minutes):  45   Critical care time was exclusive of:  Separately billable procedures and treating other patients   Critical care was necessary to treat or prevent imminent or life-threatening deterioration of the following conditions:  Metabolic crisis   Critical care was time spent personally by me on the following activities:  Development of treatment plan with patient or surrogate, discussions with consultants, evaluation of patient's response to treatment, examination of patient,  obtaining history from patient or surrogate, review of old charts, re-evaluation of patient's condition, pulse oximetry, ordering and review of radiographic studies, ordering and review of laboratory studies and ordering and performing treatments and interventions   Care discussed with: admitting provider     (including critical care time) Medical Decision Making / ED Course   Medical Decision Making Amount and/or Complexity of Data Reviewed Labs: ordered. Radiology: ordered.  Risk Prescription drug management. Decision regarding hospitalization.     Complexity of Problem:  Co-morbidities/SDOH that complicate the patient evaluation/care: Noted in HPI   Patient's presenting problem/concern, DDX, and MDM listed below: Abdominal pain Pancreatitis, gastritis, biliary disease, liver failure, intra-abdominal [place]/infectious process. Noted to be hyperglycemic greater than 500. Will also need to rule out DKA/HHS.  Hospitalization Considered:  Yes  Initial Intervention:  IV pain meds and IVF    Complexity of Data:   Cardiac  Monitoring/EKG: Telemetry with normal sinus rhythm with rates in the 50s to 60s. EKG without acute ischemic changes, dysrhythmias or blocks.  Laboratory Tests ordered listed below with my independent interpretation: CBC without leukocytosis.  No anemia with stable hemoglobin. CMP with hyperglycemia without evidence of DKA.  Pseudohyponatremia from hyperglycemia.  Renal insufficiency with improve function compared to prior labs. No evidence of bili obstruction.  Bilirubin close to previous baseline. Lipase consistent with acute pancreatitis at almost 5000   Imaging Studies ordered listed below with my independent interpretation: CT consistent with acute pancreatitis.  No other serious intra-abdominal inflammatory/infectious process or bowel obstruction.     ED Course:    Assessment, Add'l Intervention, and Reassessment: Acute pancreatitis requiring admission for high-dose IV fluids. Hyperglycemia without DKA or HHS.  8 units of subcu insulin given.  Admitted to hospitalist service for further workup and management.     Final Clinical Impression(s) / ED Diagnoses Final diagnoses:  Acute pancreatitis, unspecified complication status, unspecified pancreatitis type  Hyperglycemia    This chart was dictated using voice recognition software.  Despite best efforts to proofread,  errors can occur which can change the documentation meaning.    Nira Conn, MD 06/06/23 [phone]

## 2023-06-06 NOTE — ED Triage Notes (Signed)
Pt BIB EMS from home with RUQ pain x 3 days ago. Pt has not been eating well, blood glucose has been elevated along with his blood pressure. Pt reports becoming more yellow over the past few days. Pt has had intermittent bleeding around his ostomy.

## 2023-06-06 NOTE — H&P (Signed)
History and Physical    Martin Anthony QMV:[id number] DOB: Mar 27, 1966 DOA: 06/06/2023  PCP: Nelwyn Salisbury, MD   Chief Complaint:  abd pain  HPI: Martin Anthony is a 57 y.o. male with medical history significant of decompensated alcoholic cirrhosis, GERD, hypertension, hyperlipidemia, colon cancer status post colectomy, type 2 diabetes who presents emergency department with severe epigastric pain.  Patient states symptoms been going on for last 4 days with poor p.o. intake.  Due to severe pain he presented to the ER where he was found to be afebrile hemodynamically stable.  Abs were obtained on presentation which demonstrated sodium 121, potassium 5.5, glucose 686, creatinine 2.9 at baseline, AST 35, ALT 20, lipase 4900, WBC 4.0, hemoglobin 8, urinalysis negative for infection, pH 7.33, INR 1.6.  Patient underwent CT abdomen pelvis which demonstrated acute pancreatitis with cirrhotic nephrology as well as gastritis.  Patient was started on IV fluids and pain control admitted for the management of acute pancreatitis.   Review of Systems: Review of Systems  Constitutional:  Negative for chills and fever.  HENT: Negative.    Eyes: Negative.   Cardiovascular: Negative.  Negative for chest pain.  Gastrointestinal:  Positive for abdominal pain and vomiting. Negative for heartburn and nausea.  Genitourinary: Negative.   Musculoskeletal: Negative.   Skin: Negative.   Neurological: Negative.   Endo/Heme/Allergies: Negative.   Psychiatric/Behavioral: Negative.       As per HPI otherwise 10 point review of systems negative.   Allergies  Allergen Reactions   Acetaminophen Other (See Comments)    Hx of advanced cirrhosis/liver disease   Ibuprofen Other (See Comments)    Hx of advanced cirrhosis/liver disease     Past Medical History:  Diagnosis Date   Anxiety    Ascites due to alcoholic cirrhosis (HCC)    Cirrhosis with alcoholism (HCC)    Depression    Diabetes mellitus without  complication (HCC)    GERD (gastroesophageal reflux disease)    Gout    Hyperlipidemia    Hypertension    Pancreatitis    Primary adenocarcinoma of ascending colon (HCC)    Substance abuse (HCC)    alcoholism    Type 2 diabetes mellitus with diabetic neuropathy (HCC)     Past Surgical History:  Procedure Laterality Date   BIOPSY  01/19/2019   Procedure: BIOPSY;  Surgeon: Jeani Hawking, MD;  Location: [facility];  Service: Endoscopy;;   BIOPSY  05/09/2023   Procedure: BIOPSY;  Surgeon: Kathi Der, MD;  Location: [facility];  Service: Gastroenterology;;   COLON SURGERY  10/25/2021   right hemicolectomy with ostomy at [facility] in [place]   COLONOSCOPY N/A 01/19/2019   Procedure: COLONOSCOPY;  Surgeon: Jeani Hawking, MD;  Location: [facility];  Service: Endoscopy;  Laterality: N/A;   ESOPHAGEAL BANDING  03/25/2023   Procedure: ESOPHAGEAL BANDING;  Surgeon: Kerin Salen, MD;  Location: [facility];  Service: Gastroenterology;;   ESOPHAGOGASTRODUODENOSCOPY (EGD) WITH PROPOFOL N/A 01/19/2019   Procedure: ESOPHAGOGASTRODUODENOSCOPY (EGD) WITH PROPOFOL;  Surgeon: Jeani Hawking, MD;  Location: [facility];  Service: Endoscopy;  Laterality: N/A;   ESOPHAGOGASTRODUODENOSCOPY (EGD) WITH PROPOFOL N/A 03/25/2023   Procedure: ESOPHAGOGASTRODUODENOSCOPY (EGD) WITH PROPOFOL;  Surgeon: Kerin Salen, MD;  Location: [facility];  Service: Gastroenterology;  Laterality: N/A;   ESOPHAGOGASTRODUODENOSCOPY (EGD) WITH PROPOFOL N/A 05/09/2023   Procedure: ESOPHAGOGASTRODUODENOSCOPY (EGD) WITH PROPOFOL;  Surgeon: Kathi Der, MD;  Location: [facility];  Service: Gastroenterology;  Laterality: N/A;   HEMOSTASIS CLIP PLACEMENT  01/19/2019   Procedure:  HEMOSTASIS CLIP PLACEMENT;  Surgeon: Jeani Hawking, MD;  Location: [facility];  Service: Endoscopy;;   HERNIA REPAIR     umbilical    ILEOSCOPY N/A 03/25/2023   Procedure: ILEOSCOPY THROUGH STOMA;  Surgeon: Kerin Salen, MD;  Location:  [facility];  Service: Gastroenterology;  Laterality: N/A;   INGUINAL HERNIA REPAIR Bilateral    IR FLUORO GUIDE CV LINE RIGHT  05/13/2021   IR PARACENTESIS  05/19/2021   IR US GUIDE VASC ACCESS RIGHT  05/13/2021   POLYPECTOMY  01/19/2019   Procedure: POLYPECTOMY;  Surgeon: Jeani Hawking, MD;  Location: [facility];  Service: Endoscopy;;     reports that he quit smoking about 21 years ago. His smoking use included cigarettes. He has never used smokeless tobacco. He reports current alcohol use of about 2.0 standard drinks of alcohol per week. He reports that he does not use drugs.  Family History  Problem Relation Age of Onset   Hypertension Mother    Diabetes Father    Heart attack Father    Pneumonia Father    Hypertension Father    Diabetes Maternal Grandfather     Prior to Admission medications   Medication Sig Start Date End Date Taking? Authorizing Provider  folic acid (FOLVITE) 1 MG tablet Take 1 tablet (1 mg total) by mouth daily. 05/21/21   Leroy Sea, MD  furosemide (LASIX) 20 MG tablet Take 1 tablet (20 mg total) by mouth daily. 04/08/23   Hughie Closs, MD  gabapentin (NEURONTIN) 100 MG capsule Take 1 capsule (100 mg total) by mouth 3 (three) times daily. Patient taking differently: Take 100 mg by mouth 3 (three) times daily. Take in addition to one 300mg  capsule at bedtime for a total daily dose of 600mg  04/08/23 05/19/24  Hughie Closs, MD  gabapentin (NEURONTIN) 300 MG capsule Take 300 mg by mouth at bedtime. Take in addition to 100mg  three times daily for a total daily dose of 600mg     [provider]  Iron, Ferrous Sulfate, 325 (65 Fe) MG TABS Take 325 mg by mouth daily. Patient taking differently: Take 325 mg by mouth daily with breakfast. 12/18/21   Nelwyn Salisbury, MD  ketoconazole (NIZORAL) 2 % cream Apply 1 Application topically 2 (two) times daily.    [provider]  midodrine (PROAMATINE) 5 MG tablet Take 1 tablet (5 mg total) by mouth 3  (three) times daily with meals. 05/12/23 06/11/23  Lanae Boast, MD  nicotine (NICODERM CQ - DOSED IN MG/24 HOURS) 14 mg/24hr patch Place 1 patch (14 mg total) onto the skin daily. 04/08/23 04/07/24  Hughie Closs, MD  nystatin (MYCOSTATIN/NYSTOP) powder Apply 1 Application topically 3 (three) times daily. 05/03/23   Nelwyn Salisbury, MD  pantoprazole (PROTONIX) 40 MG tablet Take 1 tablet (40 mg total) by mouth daily. 06/11/23   Lanae Boast, MD  rifaximin (XIFAXAN) 550 MG TABS tablet Take 1 tablet (550 mg total) by mouth 2 (two) times daily. 01/20/22   Nelwyn Salisbury, MD  sertraline (ZOLOFT) 50 MG tablet Take 1 tablet (50 mg total) by mouth daily. 02/02/23   Nelwyn Salisbury, MD  spironolactone (ALDACTONE) 100 MG tablet Take 1 tablet (100 mg total) by mouth daily. 05/13/23 06/12/23  Lanae Boast, MD  sucralfate (CARAFATE) 1 g tablet Take 1 tablet (1 g total) by mouth 4 (four) times daily -  with meals and at bedtime. 05/12/23 06/11/23  Lanae Boast, MD  thiamine 100 MG tablet Take 1 tablet (100 mg total)  by mouth daily. 05/21/21   Leroy Sea, MD    Physical Exam: Vitals:   06/06/23 0023 06/06/23 0200 06/06/23 0500 06/06/23 0504  BP: (!) 153/68 (!) 160/76 (!) 142/90   Pulse: 60 (!) 58 67   Resp: 14 18 18    Temp: 99.1 F (37.3 C)   (!) 97.5 F (36.4 C)  TempSrc: Oral   Oral  SpO2: 99% 100% 100%    Physical Exam Constitutional:      Appearance: He is normal weight.  HENT:     Head: Normocephalic.  Eyes:     Extraocular Movements: Extraocular movements intact.  Cardiovascular:     Rate and Rhythm: Normal rate and regular rhythm.  Pulmonary:     Effort: Pulmonary effort is normal.     Breath sounds: Normal breath sounds.  Abdominal:     General: Abdomen is flat. Bowel sounds are normal.     Palpations: Abdomen is soft.  Skin:    General: Skin is warm.     Capillary Refill: Capillary refill takes less than 2 seconds.  Neurological:     General: No focal deficit present.     Mental  Status: He is alert.  Psychiatric:        Mood and Affect: Mood normal.        Labs on Admission: I have personally reviewed the patients's labs and imaging studies.  Assessment/Plan Principal Problem:   Acute pancreatitis   # Acute pancreatitis most likely secondary to alcohol # Hyperbilirubinemia - Patient continues to smoke - Last alcoholic beverage was in September - No medications likely cause pancreatitis - Gallbladder still in place - Bilirubin 3.5  Plan: Continue IV fluids Obtain MRCP to further assess for gallstone disease as patient is not actively drinking and has obstructive jaundice. Clear liquid diet and advance as tolerated  # Hyperglycemia poorly controlled type 2 diabetes-placed on Lantus 10 units with sliding scale every 4 hours  # Gastritis-continue pantoprazole at home.  Patient has history of prior GI bleed  # Alcoholic cirrhosis complicated by decompensation of ascites-patient is on carvedilol, Lasix, spironolactone and rifaximin.  Will hold diuretic in setting of pancreatitis and resume upon resolution  # Paroxysmal A-fib-continue to monitor  # CKD stage III-patient's creatinine 2.9 which appears to be near baseline.  Will continue to monitor  # Hyperkalemia-likely resolved with IV fluid resuscitation  # Hyponatremia-likely related to dehydration.  Will continue to monitor  # Anemia, thrombocytopenia-likely related to cirrhosis.  Will continue to monitor  Admission status: Inpatient Telemetry  Certification: The appropriate patient status for this patient is INPATIENT. Inpatient status is judged to be reasonable and necessary in order to provide the required intensity of service to ensure the patient's safety. The patient's presenting symptoms, physical exam findings, and initial radiographic and laboratory data in the context of their chronic comorbidities is felt to place them at high risk for further clinical deterioration. Furthermore, it is  not anticipated that the patient will be medically stable for discharge from the hospital within 2 midnights of admission.   * I certify that at the point of admission it is my clinical judgment that the patient will require inpatient hospital care spanning beyond 2 midnights from the point of admission due to high intensity of service, high risk for further deterioration and high frequency of surveillance required.Alan Mulder MD [facility] If 7PM-7AM, please contact night-coverage [email or web address]  06/06/2023, 5:18 AM

## 2023-06-06 NOTE — Progress Notes (Signed)
PROGRESS NOTE    Martin Anthony  QQP:[id number] DOB: [birth year] DOA: 06/06/2023 PCP: Nelwyn Salisbury, MD   Brief Narrative:  HPI: Martin Anthony is a 57 y.o. male with medical history significant of decompensated alcoholic cirrhosis, GERD, hypertension, hyperlipidemia, colon cancer status post colectomy, type 2 diabetes who presents emergency department with severe epigastric pain.  Patient states symptoms been going on for last 4 days with poor p.o. intake.  Due to severe pain he presented to the ER where he was found to be afebrile hemodynamically stable.  Abs were obtained on presentation which demonstrated sodium 121, potassium 5.5, glucose 686, creatinine 2.9 at baseline, AST 35, ALT 20, lipase 4900, WBC 4.0, hemoglobin 8, urinalysis negative for infection, pH 7.33, INR 1.6.  Patient underwent CT abdomen pelvis which demonstrated acute pancreatitis with cirrhotic nephrology as well as gastritis.  Patient was started on IV fluids and pain control admitted for the management of acute pancreatitis.   Assessment & Plan:   Principal Problem:   Acute pancreatitis Active Problems:   Type 2 diabetes mellitus with neurological complications (HCC)   PAF (paroxysmal atrial fibrillation) (HCC)   Depression with anxiety   Elevated LFTs   Thrombocytopenia (HCC)  Acute pancreatitis, POA: Patient denies using alcohol since the end of September.  I am not sure if he is continuing to drink.  MRCP negative for choledocholithiasis.  Gallbladder wall thickening but this is nonspecific in the setting of cirrhosis with portal hypertension and ascites.  Still with pain.  Check triglycerides.  Gallstone ruled out.  Will continue clear liquid diet with pain management.  Change fentanyl to Dilaudid and keep oral oxycodone.  Will check alcohol level.  History of type 2 diabetes mellitus with hyperglycemia: Interestingly, his hemoglobin A1c has remained either normal or in prediabetes range for last 4 years with recent  6.2 just 2 weeks ago when he was hospitalized but he presented with significant hyperglycemia and blood sugar of 686 upon arrival.  Of note, during recent hospitalization as well, upon presentation, his blood sugar was in 500 range and he was managed with only SSI while inpatient.  He has now been started on 15 units of Semglee along with SSI, blood sugar is improving.  Watch closely.  History of duodenitis and recent hospitalization for acute blood loss anemia/has anemia of chronic disease: Hemoglobin at his baseline.  Continue Protonix.  History of alcoholic liver cirrhosis with ascites: Appears to be stable.  Alcohol induced pancytopenia: Stable.  Monitor closely.  Paroxysmal atrial fibrillation: Not a candidate for anticoagulation due to GI bleed.  Hyperkalemia: 5.5 this morning.  Will recheck around noon.  Acute on chronic hyponatremia: Patient's sodium at baseline remains between 126-132.  Currently at 121.  Repeating labs this afternoon and in the morning.  Monitor closely.  AKI on CKD stage IIIb: Baseline creatinine around 1.9 however creatinine rose to 4.83 during recent hospitalization and at the time of discharge, it was 3.2.  Fortunately, his creatinine is better than that and 2.94 today but still elevated than his baseline.  Will monitor daily.  Avoid nephrotoxic agents.  History of sinus bradycardia: We discontinued his beta-blocker during recent hospitalization.  Liver lesion/mass: Indeterminate and equivocal 1.2 cm rim enhancing lesion within segment 3 of the liver. Recommend follow-up dedicated multiphase liver MRI in 3-6 months to ensure stability.  DVT prophylaxis: Place and maintain sequential compression device Start: 06/06/23 1027 SCDs Start: 06/06/23 0517   Code Status: Full Code  Family Communication:  None present at bedside.  Plan of care discussed with patient in length and he/she verbalized understanding and agreed with it.  Status is: Inpatient Remains  inpatient appropriate because: Severe abdominal pain due to severe pancreatitis.     Estimated body mass index is 21.92 kg/m as calculated from the following:   Height as of 05/20/23: 6\' 4"  (1.93 m).   Weight as of 05/25/23: 81.7 kg.    Nutritional Assessment: There is no height or weight on file to calculate BMI.. Seen by dietician.  I agree with the assessment and plan as outlined below: Nutrition Status:        . Skin Assessment: I have examined the patient's skin and I agree with the wound assessment as performed by the wound care RN as outlined below:    Consultants:  None  Procedures:  None  Antimicrobials:  Anti-infectives (From admission, onward)    Start     Dose/Rate Route Frequency Ordered Stop   06/06/23 1000  rifaximin (XIFAXAN) tablet 550 mg        550 mg Oral 2 times daily 06/06/23 0518           Subjective: Seen and examined, still in the ED.  Still complains of 10 out of 10 abdominal pain.  Denies consuming any alcoholic products since the end of September.  No other complaint.  Objective: Vitals:   06/06/23 0200 06/06/23 0500 06/06/23 0504 06/06/23 0830  BP: (!) 160/76 (!) 142/90  (!) 145/72  Pulse: (!) 58 67  63  Resp: 18 18  16   Temp:   (!) 97.5 F (36.4 C) 98.3 F (36.8 C)  TempSrc:   Oral Oral  SpO2: 100% 100%  99%    Intake/Output Summary (Last 24 hours) at 06/06/2023 1101 Last data filed at 06/06/2023 0518 Gross per 24 hour  Intake 2000 ml  Output --  Net 2000 ml   There were no vitals filed for this visit.  Examination:  General exam: Appears calm and comfortable  Respiratory system: Clear to auscultation. Respiratory effort normal. Cardiovascular system: S1 & S2 heard, RRR. No JVD, murmurs, rubs, gallops or clicks. No pedal edema. Gastrointestinal system: Abdomen is nondistended, soft and generalized tenderness and some fluid shift consistent with very mild ascites. No organomegaly or masses felt. Normal bowel sounds  heard. Central nervous system: Alert and oriented. No focal neurological deficits. Extremities: Symmetric 5 x 5 power. Skin: No rashes, lesions or ulcers Psychiatry: Judgement and insight appear normal. Mood & affect appropriate.    Data Reviewed: I have personally reviewed following labs and imaging studies  CBC: Recent Labs  Lab 06/06/23 0026  WBC 4.0  NEUTROABS 3.3  HGB 8.0*  HCT 23.4*  MCV 95.5  PLT 49*   Basic Metabolic Panel: Recent Labs  Lab 06/06/23 0026  NA 121*  K 5.5*  CL 98  CO2 15*  GLUCOSE 686*  BUN 52*  CREATININE 2.94*  CALCIUM 8.8*   GFR: Estimated Creatinine Clearance: 32.4 mL/min (A) (by C-G formula based on SCr of 2.94 mg/dL (H)). Liver Function Tests: Recent Labs  Lab 06/06/23 0026  AST 35  ALT 20  ALKPHOS 118  BILITOT 3.5*  PROT 7.4  ALBUMIN 3.3*   Recent Labs  Lab 06/06/23 0026  LIPASE 4,907*   No results for input(s): "AMMONIA" in the last 168 hours. Coagulation Profile: Recent Labs  Lab 06/06/23 0035  INR 1.6*   Cardiac Enzymes: No results for input(s): "CKTOTAL", "CKMB", "CKMBINDEX", "TROPONINI" in the  last 168 hours. BNP (last 3 results) No results for input(s): "PROBNP" in the last 8760 hours. HbA1C: No results for input(s): "HGBA1C" in the last 72 hours. CBG: Recent Labs  Lab 06/06/23 0021 06/06/23 0508 06/06/23 0828  GLUCAP 567* 411* 276*   Lipid Profile: No results for input(s): "CHOL", "HDL", "LDLCALC", "TRIG", "CHOLHDL", "LDLDIRECT" in the last 72 hours. Thyroid Function Tests: No results for input(s): "TSH", "T4TOTAL", "FREET4", "T3FREE", "THYROIDAB" in the last 72 hours. Anemia Panel: No results for input(s): "VITAMINB12", "FOLATE", "FERRITIN", "TIBC", "IRON", "RETICCTPCT" in the last 72 hours. Sepsis Labs: No results for input(s): "PROCALCITON", "LATICACIDVEN" in the last 168 hours.  No results found for this or any previous visit (from the past 240 hours).   Radiology Studies: MR ABDOMEN MRCP W WO  CONTAST Result Date: 06/06/2023 CLINICAL DATA:  Jaundice and hyperbilirubinemia. EXAM: MRI ABDOMEN WITHOUT AND WITH CONTRAST (INCLUDING MRCP) TECHNIQUE: Multiplanar multisequence MR imaging of the abdomen was performed both before and after the administration of intravenous contrast. Heavily T2-weighted images of the biliary and pancreatic ducts were obtained, and three-dimensional MRCP images were rendered by post processing. CONTRAST:  7mL GADAVIST GADOBUTROL 1 MMOL/ML IV SOLN COMPARISON:  CT 06/06/2023. FINDINGS: Lower chest: No acute findings. Hepatobiliary: Cirrhotic morphology of the liver which has a diffusely nodular contour and hypertrophy of the lateral segment and caudate lobes of liver. Heterogeneous T2 and T1 signal abnormality is noted throughout the liver compatible with cirrhosis. There is no true arterial phase images. On the early sequences there is a, within segment 3 of the liver, there is a focal rim enhancing lesion with central T1 hypointensity measuring 1.2 cm. This becomes isointense to the liver on the delayed images. No corresponding signal abnormality on the diffusion-weighted sequences. No additional enhancing liver lesions identified. The gallbladder wall appears thickened which measures 4 to 5 mm, image 20/4. No gallstones identified. No bile duct dilatation. Pancreas: The pancreas appears edematous with diffuse peripancreatic stranding/edema. Edema extends into the small bowel mesentery and into the bilateral retroperitoneal spaces. No main duct dilatation or focal mass lesion identified. No pseudo cyst or signs of pancreatic necrosis. Spleen: Splenomegaly as before. Spleen measures 17 cm in cranial caudal dimension. Adrenals/Urinary Tract: No suspicious kidney mass or signs of obstructive uropathy. Normal adrenal glands. Stomach/Bowel: Normal appearance of the stomach. Right paramidline ileostomy is identified. No dilated bowel loops identified. Hartmann's pouch terminates in the  right upper quadrant of the abdomen. Vascular/Lymphatic: Normal caliber abdominal aorta. Portal vein, splenic vein and SMV all remain patent. Upper abdominal varicosities identified. Esophageal varices. No abdominal adenopathy. Other:  Perihepatic ascites as noted previously. Musculoskeletal: No suspicious bone lesions identified. IMPRESSION: 1. Cirrhotic morphology of the liver with signs of portal hypertension including splenomegaly, upper abdominal varicosities and perihepatic ascites. 2. Indeterminate and equivocal 1.2 cm rim enhancing lesion within segment 3 of the liver. Recommend follow-up dedicated multiphase liver MRI in 3-6 months to ensure stability. 3. The pancreas appears edematous with diffuse peripancreatic stranding/edema. Edema extends into the small bowel mesentery and into the bilateral retroperitoneal spaces. Findings are compatible with acute pancreatitis. No main duct dilatation or focal mass lesion identified. No pseudo cyst or signs of pancreatic necrosis. 4. Gallbladder wall appears thickened which measures 4 to 5 mm. This is a nonspecific finding in the setting of cirrhosis with portal venous hypertension and ascites. 5. Right paramidline ileostomy is identified. No dilated bowel loops identified. Electronically Signed   By: Signa Kell M.D.   On: 06/06/2023 07:58  MR 3D Recon At Scanner Result Date: 06/06/2023 CLINICAL DATA:  Jaundice and hyperbilirubinemia. EXAM: MRI ABDOMEN WITHOUT AND WITH CONTRAST (INCLUDING MRCP) TECHNIQUE: Multiplanar multisequence MR imaging of the abdomen was performed both before and after the administration of intravenous contrast. Heavily T2-weighted images of the biliary and pancreatic ducts were obtained, and three-dimensional MRCP images were rendered by post processing. CONTRAST:  7mL GADAVIST GADOBUTROL 1 MMOL/ML IV SOLN COMPARISON:  CT 06/06/2023. FINDINGS: Lower chest: No acute findings. Hepatobiliary: Cirrhotic morphology of the liver which has a  diffusely nodular contour and hypertrophy of the lateral segment and caudate lobes of liver. Heterogeneous T2 and T1 signal abnormality is noted throughout the liver compatible with cirrhosis. There is no true arterial phase images. On the early sequences there is a, within segment 3 of the liver, there is a focal rim enhancing lesion with central T1 hypointensity measuring 1.2 cm. This becomes isointense to the liver on the delayed images. No corresponding signal abnormality on the diffusion-weighted sequences. No additional enhancing liver lesions identified. The gallbladder wall appears thickened which measures 4 to 5 mm, image 20/4. No gallstones identified. No bile duct dilatation. Pancreas: The pancreas appears edematous with diffuse peripancreatic stranding/edema. Edema extends into the small bowel mesentery and into the bilateral retroperitoneal spaces. No main duct dilatation or focal mass lesion identified. No pseudo cyst or signs of pancreatic necrosis. Spleen: Splenomegaly as before. Spleen measures 17 cm in cranial caudal dimension. Adrenals/Urinary Tract: No suspicious kidney mass or signs of obstructive uropathy. Normal adrenal glands. Stomach/Bowel: Normal appearance of the stomach. Right paramidline ileostomy is identified. No dilated bowel loops identified. Hartmann's pouch terminates in the right upper quadrant of the abdomen. Vascular/Lymphatic: Normal caliber abdominal aorta. Portal vein, splenic vein and SMV all remain patent. Upper abdominal varicosities identified. Esophageal varices. No abdominal adenopathy. Other:  Perihepatic ascites as noted previously. Musculoskeletal: No suspicious bone lesions identified. IMPRESSION: 1. Cirrhotic morphology of the liver with signs of portal hypertension including splenomegaly, upper abdominal varicosities and perihepatic ascites. 2. Indeterminate and equivocal 1.2 cm rim enhancing lesion within segment 3 of the liver. Recommend follow-up dedicated  multiphase liver MRI in 3-6 months to ensure stability. 3. The pancreas appears edematous with diffuse peripancreatic stranding/edema. Edema extends into the small bowel mesentery and into the bilateral retroperitoneal spaces. Findings are compatible with acute pancreatitis. No main duct dilatation or focal mass lesion identified. No pseudo cyst or signs of pancreatic necrosis. 4. Gallbladder wall appears thickened which measures 4 to 5 mm. This is a nonspecific finding in the setting of cirrhosis with portal venous hypertension and ascites. 5. Right paramidline ileostomy is identified. No dilated bowel loops identified. Electronically Signed   By: Signa Kell M.D.   On: 06/06/2023 07:58   CT Renal Stone Study Result Date: 06/06/2023 CLINICAL DATA:  Right upper quadrant pain with intermittent bleeding around and existing ostomy site. EXAM: CT ABDOMEN AND PELVIS WITHOUT CONTRAST TECHNIQUE: Multidetector CT imaging of the abdomen and pelvis was performed following the standard protocol without IV contrast. RADIATION DOSE REDUCTION: This exam was performed according to the departmental dose-optimization program which includes automated exposure control, adjustment of the mA and/or kV according to patient size and/or use of iterative reconstruction technique. COMPARISON:  May 20, 2023 FINDINGS: Lower chest: No acute abnormality. Hepatobiliary: The liver is cirrhotic in appearance. No focal liver abnormality is seen. No gallstones, gallbladder wall thickening, or biliary dilatation. Pancreas: Mild peripancreatic inflammatory fat stranding is seen. This represents a new finding when compared  to the prior study. There is no evidence of pancreatic ductal dilatation Spleen: There is moderate to marked severity splenomegaly. Adrenals/Urinary Tract: Adrenal glands are unremarkable. Kidneys are normal in size, without obstructing renal calculi, focal lesion, or hydronephrosis. A 5 mm nonobstructing renal calculus is  seen within the lower pole of the right kidney. The 3 mm nonobstructing renal calculus seen within the left kidney on the prior study is no longer visualized. Bladder is unremarkable. Stomach/Bowel: The stomach is poorly distended. Moderate to marked severity diffuse gastric wall thickening is noted. Postoperative changes consistent with prior right hemicolectomy are seen with a subsequent right-sided paramidline ileostomy site. No evidence of bowel wall thickening, distention, or inflammatory changes. Vascular/Lymphatic: Aortic atherosclerosis. No enlarged abdominal or pelvic lymph nodes. Reproductive: Prostate is unremarkable. Other: A 2.8 cm x 4.6 cm fluid-filled right inguinal hernia is seen. This is increased in size when compared to the prior study. There is a mild amount of perihepatic free fluid. A small amount of posterior pelvic free fluid is also noted. Musculoskeletal: No acute or significant osseous findings. IMPRESSION: 1. Findings consistent with acute pancreatitis. Correlation with pancreatic enzymes is recommended. 2. Cirrhosis with moderate to marked severity splenomegaly. 3. Moderate to marked severity diffuse gastric wall thickening which may represent gastritis. 4. Postoperative changes consistent with prior right hemicolectomy with a subsequent right-sided paramidline ileostomy site. 5. 2.8 cm x 4.6 cm fluid-filled right inguinal hernia. 6. Mild amount of perihepatic and posterior pelvic free fluid. 7. 5 mm nonobstructing right renal calculus. 8. Aortic atherosclerosis. Aortic Atherosclerosis (ICD10-I70.0). Electronically Signed   By: Aram Candela M.D.   On: 06/06/2023 03:34    Scheduled Meds:  gabapentin  100 mg Oral BID   gabapentin  300 mg Oral QHS   insulin aspart  0-24 Units Subcutaneous Q4H   insulin detemir  15 Units Subcutaneous Daily   midodrine  5 mg Oral TID WC   pantoprazole  40 mg Oral Daily   rifaximin  550 mg Oral BID   sertraline  50 mg Oral Daily   Continuous  Infusions:  sodium chloride     lactated ringers 200 mL/hr at 06/06/23 8469     LOS: 0 days   Hughie Closs, MD [facility]  06/06/2023, 11:01 AM   *Please note that this is a verbal dictation therefore any spelling or grammatical errors are due to the "Dragon [employer] One" system interpretation.  Please page via Amion and do not message via secure chat for urgent patient care matters. Secure chat can be used for non urgent patient care matters.  How to contact the [facility] Attending or Consulting provider 7A - 7P or covering provider during after hours 7P -7A, for this patient?  Check the care team in [facility] and look for a) attending/consulting [facility] provider listed and b) the [facility] team listed. Page or secure chat 7A-7P. Log into [email or web address] and use [place]'s universal password to access. If you do not have the password, please contact the hospital operator. Locate the [facility] provider you are looking for under [facility] and page to a number that you can be directly reached. If you still have difficulty reaching the provider, please page the [facility] (Director on Call) for the Hospitalists listed on amion for assistance.

## 2023-06-06 NOTE — Plan of Care (Signed)
  Problem: Education: Goal: Knowledge of General Education information will improve Description: Including pain rating scale, medication(s)/side effects and non-pharmacologic comfort measures Outcome: Progressing   Problem: Health Behavior/Discharge Planning: Goal: Ability to manage health-related needs will improve Outcome: Progressing   Problem: Clinical Measurements: Goal: Ability to maintain clinical measurements within normal limits will improve Outcome: Progressing Goal: Will remain free from infection Outcome: Progressing Goal: Diagnostic test results will improve Outcome: Progressing Goal: Respiratory complications will improve Outcome: Progressing Goal: Cardiovascular complication will be avoided Outcome: Progressing   Problem: Activity: Goal: Risk for activity intolerance will decrease Outcome: Progressing   Problem: Nutrition: Goal: Adequate nutrition will be maintained Outcome: Progressing   Problem: Coping: Goal: Level of anxiety will decrease Outcome: Progressing   Problem: Elimination: Goal: Will not experience complications related to bowel motility Outcome: Progressing   Problem: Pain Management: Goal: General experience of comfort will improve Outcome: Progressing   Problem: Safety: Goal: Ability to remain free from injury will improve Outcome: Progressing   Problem: Skin Integrity: Goal: Risk for impaired skin integrity will decrease Outcome: Progressing

## 2023-06-07 DIAGNOSIS — K852 Alcohol induced acute pancreatitis without necrosis or infection: Secondary | ICD-10-CM | POA: Diagnosis not present

## 2023-06-07 LAB — CBC
HCT: 22.1 % — ABNORMAL LOW (ref 39.0–52.0)
Hemoglobin: 7.5 g/dL — ABNORMAL LOW (ref 13.0–17.0)
MCH: 32.9 pg (ref 26.0–34.0)
MCHC: 33.9 g/dL (ref 30.0–36.0)
MCV: 96.9 fL (ref 80.0–100.0)
Platelets: 38 10*3/uL — ABNORMAL LOW (ref 150–400)
RBC: 2.28 MIL/uL — ABNORMAL LOW (ref 4.22–5.81)
RDW: 15.9 % — ABNORMAL HIGH (ref 11.5–15.5)
WBC: 3.3 10*3/uL — ABNORMAL LOW (ref 4.0–10.5)
nRBC: 0 % (ref 0.0–0.2)

## 2023-06-07 LAB — GLUCOSE, CAPILLARY
Glucose-Capillary: 133 mg/dL — ABNORMAL HIGH (ref 70–99)
Glucose-Capillary: 136 mg/dL — ABNORMAL HIGH (ref 70–99)
Glucose-Capillary: 145 mg/dL — ABNORMAL HIGH (ref 70–99)
Glucose-Capillary: 159 mg/dL — ABNORMAL HIGH (ref 70–99)
Glucose-Capillary: 268 mg/dL — ABNORMAL HIGH (ref 70–99)
Glucose-Capillary: 53 mg/dL — ABNORMAL LOW (ref 70–99)
Glucose-Capillary: 64 mg/dL — ABNORMAL LOW (ref 70–99)
Glucose-Capillary: 87 mg/dL (ref 70–99)

## 2023-06-07 LAB — COMPREHENSIVE METABOLIC PANEL
ALT: 17 U/L (ref 0–44)
AST: 32 U/L (ref 15–41)
Albumin: 2.9 g/dL — ABNORMAL LOW (ref 3.5–5.0)
Alkaline Phosphatase: 64 U/L (ref 38–126)
Anion gap: 8 (ref 5–15)
BUN: 34 mg/dL — ABNORMAL HIGH (ref 6–20)
CO2: 15 mmol/L — ABNORMAL LOW (ref 22–32)
Calcium: 8.3 mg/dL — ABNORMAL LOW (ref 8.9–10.3)
Chloride: 102 mmol/L (ref 98–111)
Creatinine, Ser: 1.99 mg/dL — ABNORMAL HIGH (ref 0.61–1.24)
GFR, Estimated: 39 mL/min — ABNORMAL LOW (ref >60)
Glucose, Bld: 178 mg/dL — ABNORMAL HIGH (ref 70–99)
Potassium: 4.9 mmol/L (ref 3.5–5.1)
Sodium: 125 mmol/L — ABNORMAL LOW (ref 135–145)
Total Bilirubin: 3.6 mg/dL — ABNORMAL HIGH (ref <1.2)
Total Protein: 6.7 g/dL (ref 6.5–8.1)

## 2023-06-07 MED ORDER — SUCRALFATE 1 G PO TABS
1.0000 g | ORAL_TABLET | Freq: Three times a day (TID) | ORAL | Status: DC
  Administered 2023-06-07 – 2023-06-12 (×20): 1 g via ORAL
  Filled 2023-06-07 (×20): qty 1

## 2023-06-07 MED ORDER — INSULIN ASPART 100 UNIT/ML IJ SOLN: 0.0000 [IU] | Freq: Every day | INTRAMUSCULAR | Status: DC

## 2023-06-07 MED ORDER — NICOTINE 7 MG/24HR TD PT24
7.0000 mg | MEDICATED_PATCH | Freq: Every day | TRANSDERMAL | Status: DC
  Administered 2023-06-07 – 2023-06-12 (×6): 7 mg via TRANSDERMAL
  Filled 2023-06-07 (×6): qty 1

## 2023-06-07 MED ORDER — INSULIN ASPART 100 UNIT/ML IJ SOLN
0.0000 [IU] | Freq: Three times a day (TID) | INTRAMUSCULAR | Status: DC
  Administered 2023-06-07: 2 [IU] via SUBCUTANEOUS
  Administered 2023-06-07 – 2023-06-08 (×2): 3 [IU] via SUBCUTANEOUS
  Administered 2023-06-08: 2 [IU] via SUBCUTANEOUS
  Administered 2023-06-09: 3 [IU] via SUBCUTANEOUS
  Administered 2023-06-09: 2 [IU] via SUBCUTANEOUS
  Administered 2023-06-10 – 2023-06-11 (×4): 3 [IU] via SUBCUTANEOUS
  Administered 2023-06-12: 2 [IU] via SUBCUTANEOUS

## 2023-06-07 MED ORDER — FOLIC ACID 1 MG PO TABS
1.0000 mg | ORAL_TABLET | Freq: Every day | ORAL | Status: DC
  Administered 2023-06-07 – 2023-06-12 (×6): 1 mg via ORAL
  Filled 2023-06-07 (×6): qty 1

## 2023-06-07 MED ORDER — INSULIN DETEMIR 100 UNIT/ML ~~LOC~~ SOLN
10.0000 [IU] | Freq: Every day | SUBCUTANEOUS | Status: DC
  Administered 2023-06-07 – 2023-06-12 (×6): 10 [IU] via SUBCUTANEOUS
  Filled 2023-06-07 (×6): qty 0.1

## 2023-06-07 MED ORDER — PANTOPRAZOLE SODIUM 40 MG IV SOLR
40.0000 mg | Freq: Two times a day (BID) | INTRAVENOUS | Status: DC
Start: 2023-06-07 — End: 2023-06-08
  Administered 2023-06-07 – 2023-06-08 (×3): 40 mg via INTRAVENOUS
  Filled 2023-06-07 (×3): qty 10

## 2023-06-07 MED ORDER — THIAMINE HCL 100 MG PO TABS
100.0000 mg | ORAL_TABLET | Freq: Every day | ORAL | Status: DC
  Administered 2023-06-07 – 2023-06-12 (×6): 100 mg via ORAL
  Filled 2023-06-07 (×11): qty 1

## 2023-06-07 NOTE — Inpatient Diabetes Management (Signed)
Inpatient Diabetes Program Recommendations  AACE/ADA: New Consensus Statement on Inpatient Glycemic Control (2015)  Target Ranges:  Prepandial:   less than 140 mg/dL      Peak postprandial:   less than 180 mg/dL (1-2 hours)      Critically ill patients:  140 - 180 mg/dL    Latest Reference Range & Units 03/30/23 11:44 05/21/23 05:30  Hemoglobin A1C 4.8 - 5.6 % 5.0 6.2 (H)  (H): Data is abnormally high  Latest Reference Range & Units 06/06/23 00:21 06/06/23 05:08 06/06/23 08:28 06/06/23 12:00 06/06/23 16:11 06/06/23 20:03  Glucose-Capillary 70 - 99 mg/dL 161 (HH)  8 units Novolog @0247  411 (H)  20 units Novolog  15 units Levemir @0626  276 (H)  12 units Novolog  135 (H)  2 units Novolog  169 (H)  4 units Novolog  151 (H)  2 units Novolog   (HH): Data is critically high (H): Data is abnormally high  Latest Reference Range & Units 06/07/23 00:10 06/07/23 04:12 06/07/23 04:43 06/07/23 05:17 06/07/23 07:43  Glucose-Capillary 70 - 99 mg/dL 096 (H)  12 units Novolog  53 (L) 64 (L) 87 145 (H)  2 units Novolog   (H): Data is abnormally high (L): Data is abnormally low   Admit with: Acute pancreatitis most likely secondary to alcohol   History: DM2, Decompensated alcoholic cirrhosis, Colon cancer status post colectomy    Home DM Meds: None Listed  Current Orders: Novolog 0-24 units Q4 hours      Levemir 15 units daily    MD- Note Hypoglycemia at 4am today (CBG 53)--Likely due to 12 units Novolog given at [facility] Pt is on a heavy Novolog SSI regimen  Please reduce the Novolog to the 0-15 unit Moderate Scale Q4 hours  Please reduce the Levemir to 10 units Daily    --Will follow patient during hospitalization--  Ambrose Finland RN, MSN, CDCES Diabetes Coordinator Inpatient Glycemic Control Team Team Pager: [phone] (8a-5p)

## 2023-06-07 NOTE — Progress Notes (Signed)
PROGRESS NOTE    HELIOS GELTZ  XLK:[id number] DOB: 01/16/1966 DOA: 06/06/2023 PCP: Nelwyn Salisbury, MD   Brief Narrative:  HPI: Martin Anthony is a 57 y.o. male with medical history significant of decompensated alcoholic cirrhosis, GERD, hypertension, hyperlipidemia, colon cancer status post colectomy, type 2 diabetes who presents emergency department with severe epigastric pain.  Patient states symptoms been going on for last 4 days with poor p.o. intake.  Due to severe pain he presented to the ER where he was found to be afebrile hemodynamically stable.  Abs were obtained on presentation which demonstrated sodium 121, potassium 5.5, glucose 686, creatinine 2.9 at baseline, AST 35, ALT 20, lipase 4900, WBC 4.0, hemoglobin 8, urinalysis negative for infection, pH 7.33, INR 1.6.  Patient underwent CT abdomen pelvis which demonstrated acute pancreatitis with cirrhotic nephrology as well as gastritis.  Patient was started on IV fluids and pain control admitted for the management of acute pancreatitis.   Assessment & Plan:   Principal Problem:   Acute pancreatitis Active Problems:   Type 2 diabetes mellitus with neurological complications (HCC)   PAF (paroxysmal atrial fibrillation) (HCC)   Depression with anxiety   Elevated LFTs   Thrombocytopenia (HCC)  Acute pancreatitis, POA: Patient denies using alcohol since the end of September.  I am not sure if he is continuing to drink.  MRCP negative for choledocholithiasis.  Gallbladder wall thickening but this is nonspecific in the setting of cirrhosis with portal hypertension and ascites.  Triglycerides normal.  Gallstone ruled out.  Pain is slightly improved.  He is requesting to advance diet.  Will try full liquid diet.  History of type 2 diabetes mellitus with hyperglycemia: Interestingly, his hemoglobin A1c has remained either normal or in prediabetes range for last 4 years with recent 6.2 just 2 weeks ago when he was hospitalized but he presented  with significant hyperglycemia and blood sugar of 686 upon arrival.  Of note, during recent hospitalization as well, upon presentation, his blood sugar was in 500 range and he was managed with only SSI while inpatient.  He was started on 15 units of Semglee.  Patient became hypoglycemic this morning.  Reducing Semglee to 10 units and reducing SSI to moderate scale.  Gastritis: Likely secondary to chronic alcoholism and recurrent pancreatitis.  Continue twice daily PPI.  Check CBC daily.  History of duodenitis and recent hospitalization for acute blood loss anemia/has anemia of chronic disease: Hemoglobin at his baseline.  Continue Protonix but will switch to IV and make it twice daily.  History of alcoholic liver cirrhosis with ascites: Appears to be stable.  Alcohol induced pancytopenia: Stable.  Monitor closely.  Paroxysmal atrial fibrillation: Not a candidate for anticoagulation due to GI bleed.  Hyperkalemia: 5.5 this morning.  Will recheck around noon.  Acute on chronic hyponatremia: Patient's sodium at baseline remains between 126-132.  Currently at 121.  Repeating labs this afternoon and in the morning.  Monitor closely.  AKI on CKD stage IIIb: Baseline creatinine around 1.9 however creatinine rose to 4.83 during recent hospitalization and at the time of discharge, it was 3.2.  Fortunately, his creatinine is better than that and 2.94 upon arrival and further improved to 1.99 today. Will monitor daily.  Avoid nephrotoxic agents.  History of sinus bradycardia: We discontinued his beta-blocker during recent hospitalization.  Liver lesion/mass: Indeterminate and equivocal 1.2 cm rim enhancing lesion within segment 3 of the liver. Recommend follow-up dedicated multiphase liver MRI in 3-6 months to ensure stability.  Nephrolithiasis: CT renal study showed 5 mm nonobstructing right renal calculus.  Patient is asymptomatic.  DVT prophylaxis: Place and maintain sequential compression device  Start: 06/06/23 1027 SCDs Start: 06/06/23 0517   Code Status: Full Code  Family Communication:  None present at bedside.  Plan of care discussed with patient in length and he/she verbalized understanding and agreed with it.  Status is: Inpatient Remains inpatient appropriate because: Pancreatitis, improving gradually.  Estimated body mass index is 20.56 kg/m as calculated from the following:   Height as of this encounter: 6\' 4"  (1.93 m).   Weight as of this encounter: 76.6 kg.    Nutritional Assessment: Body mass index is 20.56 kg/m.Marland Kitchen Seen by dietician.  I agree with the assessment and plan as outlined below: Nutrition Status:        . Skin Assessment: I have examined the patient's skin and I agree with the wound assessment as performed by the wound care RN as outlined below:    Consultants:  None  Procedures:  None  Antimicrobials:  Anti-infectives (From admission, onward)    Start     Dose/Rate Route Frequency Ordered Stop   06/06/23 1000  rifaximin (XIFAXAN) tablet 550 mg        550 mg Oral 2 times daily 06/06/23 0518           Subjective: Patient seen and examined.  He says his abdominal pain is better, 6 out of 10 today instead of 10 out of 10.  No other complaint.  Several questions he asked that I addressed today.  Objective: Vitals:   06/06/23 2001 06/07/23 0003 06/07/23 0411 06/07/23 0800  BP: (!) 145/74 129/81 (!) 128/56   Pulse: 68 72 78   Resp: 14 14 14 18   Temp: 98.7 F (37.1 C) 99 F (37.2 C) 99.6 F (37.6 C)   TempSrc: Oral Oral Oral   SpO2: 100% 100% 100%   Weight:      Height:        Intake/Output Summary (Last 24 hours) at 06/07/2023 0827 Last data filed at 06/07/2023 0544 Gross per 24 hour  Intake 3409.34 ml  Output --  Net 3409.34 ml   Filed Weights   06/06/23 1749  Weight: 76.6 kg    Examination:  General exam: Appears calm and comfortable  Respiratory system: Clear to auscultation. Respiratory effort  normal. Cardiovascular system: S1 & S2 heard, RRR. No JVD, murmurs, rubs, gallops or clicks. No pedal edema. Gastrointestinal system: Abdomen is nondistended, soft and mild epigastric tenderness. No organomegaly or masses felt. Normal bowel sounds heard. Central nervous system: Alert and oriented. No focal neurological deficits. Extremities: Symmetric 5 x 5 power. Skin: No rashes, lesions or ulcers.  Psychiatry: Judgement and insight appear normal. Mood & affect appropriate.    Data Reviewed: I have personally reviewed following labs and imaging studies  CBC: Recent Labs  Lab 06/06/23 0026 06/07/23 0603  WBC 4.0 3.3*  NEUTROABS 3.3  --   HGB 8.0* 7.5*  HCT 23.4* 22.1*  MCV 95.5 96.9  PLT 49* 38*   Basic Metabolic Panel: Recent Labs  Lab 06/06/23 0026 06/06/23 1630 06/07/23 0603  NA 121* 127* 125*  K 5.5* 4.5 4.9  CL 98 105 102  CO2 15* 16* 15*  GLUCOSE 686* 170* 178*  BUN 52* 42* 34*  CREATININE 2.94* 2.29* 1.99*  CALCIUM 8.8* 8.3* 8.3*   GFR: Estimated Creatinine Clearance: 44.9 mL/min (A) (by C-G formula based on SCr of 1.99 mg/dL (H)). Liver  Function Tests: Recent Labs  Lab 06/06/23 0026 06/07/23 0603  AST 35 32  ALT 20 17  ALKPHOS 118 64  BILITOT 3.5* 3.6*  PROT 7.4 6.7  ALBUMIN 3.3* 2.9*   Recent Labs  Lab 06/06/23 0026  LIPASE 4,907*   No results for input(s): "AMMONIA" in the last 168 hours. Coagulation Profile: Recent Labs  Lab 06/06/23 0035  INR 1.6*   Cardiac Enzymes: No results for input(s): "CKTOTAL", "CKMB", "CKMBINDEX", "TROPONINI" in the last 168 hours. BNP (last 3 results) No results for input(s): "PROBNP" in the last 8760 hours. HbA1C: No results for input(s): "HGBA1C" in the last 72 hours. CBG: Recent Labs  Lab 06/07/23 0010 06/07/23 0412 06/07/23 0443 06/07/23 0517 06/07/23 0743  GLUCAP 268* 53* 64* 87 145*   Lipid Profile: Recent Labs    06/06/23 1630  TRIG 53   Thyroid Function Tests: No results for input(s):  "TSH", "T4TOTAL", "FREET4", "T3FREE", "THYROIDAB" in the last 72 hours. Anemia Panel: No results for input(s): "VITAMINB12", "FOLATE", "FERRITIN", "TIBC", "IRON", "RETICCTPCT" in the last 72 hours. Sepsis Labs: No results for input(s): "PROCALCITON", "LATICACIDVEN" in the last 168 hours.  No results found for this or any previous visit (from the past 240 hours).   Radiology Studies: MR ABDOMEN MRCP W WO CONTAST Result Date: 06/06/2023 CLINICAL DATA:  Jaundice and hyperbilirubinemia. EXAM: MRI ABDOMEN WITHOUT AND WITH CONTRAST (INCLUDING MRCP) TECHNIQUE: Multiplanar multisequence MR imaging of the abdomen was performed both before and after the administration of intravenous contrast. Heavily T2-weighted images of the biliary and pancreatic ducts were obtained, and three-dimensional MRCP images were rendered by post processing. CONTRAST:  7mL GADAVIST GADOBUTROL 1 MMOL/ML IV SOLN COMPARISON:  CT 06/06/2023. FINDINGS: Lower chest: No acute findings. Hepatobiliary: Cirrhotic morphology of the liver which has a diffusely nodular contour and hypertrophy of the lateral segment and caudate lobes of liver. Heterogeneous T2 and T1 signal abnormality is noted throughout the liver compatible with cirrhosis. There is no true arterial phase images. On the early sequences there is a, within segment 3 of the liver, there is a focal rim enhancing lesion with central T1 hypointensity measuring 1.2 cm. This becomes isointense to the liver on the delayed images. No corresponding signal abnormality on the diffusion-weighted sequences. No additional enhancing liver lesions identified. The gallbladder wall appears thickened which measures 4 to 5 mm, image 20/4. No gallstones identified. No bile duct dilatation. Pancreas: The pancreas appears edematous with diffuse peripancreatic stranding/edema. Edema extends into the small bowel mesentery and into the bilateral retroperitoneal spaces. No main duct dilatation or focal mass  lesion identified. No pseudo cyst or signs of pancreatic necrosis. Spleen: Splenomegaly as before. Spleen measures 17 cm in cranial caudal dimension. Adrenals/Urinary Tract: No suspicious kidney mass or signs of obstructive uropathy. Normal adrenal glands. Stomach/Bowel: Normal appearance of the stomach. Right paramidline ileostomy is identified. No dilated bowel loops identified. Hartmann's pouch terminates in the right upper quadrant of the abdomen. Vascular/Lymphatic: Normal caliber abdominal aorta. Portal vein, splenic vein and SMV all remain patent. Upper abdominal varicosities identified. Esophageal varices. No abdominal adenopathy. Other:  Perihepatic ascites as noted previously. Musculoskeletal: No suspicious bone lesions identified. IMPRESSION: 1. Cirrhotic morphology of the liver with signs of portal hypertension including splenomegaly, upper abdominal varicosities and perihepatic ascites. 2. Indeterminate and equivocal 1.2 cm rim enhancing lesion within segment 3 of the liver. Recommend follow-up dedicated multiphase liver MRI in 3-6 months to ensure stability. 3. The pancreas appears edematous with diffuse peripancreatic stranding/edema. Edema extends into  the small bowel mesentery and into the bilateral retroperitoneal spaces. Findings are compatible with acute pancreatitis. No main duct dilatation or focal mass lesion identified. No pseudo cyst or signs of pancreatic necrosis. 4. Gallbladder wall appears thickened which measures 4 to 5 mm. This is a nonspecific finding in the setting of cirrhosis with portal venous hypertension and ascites. 5. Right paramidline ileostomy is identified. No dilated bowel loops identified. Electronically Signed   By: Signa Kell M.D.   On: 06/06/2023 07:58   MR 3D Recon At Scanner Result Date: 06/06/2023 CLINICAL DATA:  Jaundice and hyperbilirubinemia. EXAM: MRI ABDOMEN WITHOUT AND WITH CONTRAST (INCLUDING MRCP) TECHNIQUE: Multiplanar multisequence MR imaging of  the abdomen was performed both before and after the administration of intravenous contrast. Heavily T2-weighted images of the biliary and pancreatic ducts were obtained, and three-dimensional MRCP images were rendered by post processing. CONTRAST:  7mL GADAVIST GADOBUTROL 1 MMOL/ML IV SOLN COMPARISON:  CT 06/06/2023. FINDINGS: Lower chest: No acute findings. Hepatobiliary: Cirrhotic morphology of the liver which has a diffusely nodular contour and hypertrophy of the lateral segment and caudate lobes of liver. Heterogeneous T2 and T1 signal abnormality is noted throughout the liver compatible with cirrhosis. There is no true arterial phase images. On the early sequences there is a, within segment 3 of the liver, there is a focal rim enhancing lesion with central T1 hypointensity measuring 1.2 cm. This becomes isointense to the liver on the delayed images. No corresponding signal abnormality on the diffusion-weighted sequences. No additional enhancing liver lesions identified. The gallbladder wall appears thickened which measures 4 to 5 mm, image 20/4. No gallstones identified. No bile duct dilatation. Pancreas: The pancreas appears edematous with diffuse peripancreatic stranding/edema. Edema extends into the small bowel mesentery and into the bilateral retroperitoneal spaces. No main duct dilatation or focal mass lesion identified. No pseudo cyst or signs of pancreatic necrosis. Spleen: Splenomegaly as before. Spleen measures 17 cm in cranial caudal dimension. Adrenals/Urinary Tract: No suspicious kidney mass or signs of obstructive uropathy. Normal adrenal glands. Stomach/Bowel: Normal appearance of the stomach. Right paramidline ileostomy is identified. No dilated bowel loops identified. Hartmann's pouch terminates in the right upper quadrant of the abdomen. Vascular/Lymphatic: Normal caliber abdominal aorta. Portal vein, splenic vein and SMV all remain patent. Upper abdominal varicosities identified. Esophageal  varices. No abdominal adenopathy. Other:  Perihepatic ascites as noted previously. Musculoskeletal: No suspicious bone lesions identified. IMPRESSION: 1. Cirrhotic morphology of the liver with signs of portal hypertension including splenomegaly, upper abdominal varicosities and perihepatic ascites. 2. Indeterminate and equivocal 1.2 cm rim enhancing lesion within segment 3 of the liver. Recommend follow-up dedicated multiphase liver MRI in 3-6 months to ensure stability. 3. The pancreas appears edematous with diffuse peripancreatic stranding/edema. Edema extends into the small bowel mesentery and into the bilateral retroperitoneal spaces. Findings are compatible with acute pancreatitis. No main duct dilatation or focal mass lesion identified. No pseudo cyst or signs of pancreatic necrosis. 4. Gallbladder wall appears thickened which measures 4 to 5 mm. This is a nonspecific finding in the setting of cirrhosis with portal venous hypertension and ascites. 5. Right paramidline ileostomy is identified. No dilated bowel loops identified. Electronically Signed   By: Signa Kell M.D.   On: 06/06/2023 07:58   CT Renal Stone Study Result Date: 06/06/2023 CLINICAL DATA:  Right upper quadrant pain with intermittent bleeding around and existing ostomy site. EXAM: CT ABDOMEN AND PELVIS WITHOUT CONTRAST TECHNIQUE: Multidetector CT imaging of the abdomen and pelvis was performed following  the standard protocol without IV contrast. RADIATION DOSE REDUCTION: This exam was performed according to the departmental dose-optimization program which includes automated exposure control, adjustment of the mA and/or kV according to patient size and/or use of iterative reconstruction technique. COMPARISON:  May 20, 2023 FINDINGS: Lower chest: No acute abnormality. Hepatobiliary: The liver is cirrhotic in appearance. No focal liver abnormality is seen. No gallstones, gallbladder wall thickening, or biliary dilatation. Pancreas: Mild  peripancreatic inflammatory fat stranding is seen. This represents a new finding when compared to the prior study. There is no evidence of pancreatic ductal dilatation Spleen: There is moderate to marked severity splenomegaly. Adrenals/Urinary Tract: Adrenal glands are unremarkable. Kidneys are normal in size, without obstructing renal calculi, focal lesion, or hydronephrosis. A 5 mm nonobstructing renal calculus is seen within the lower pole of the right kidney. The 3 mm nonobstructing renal calculus seen within the left kidney on the prior study is no longer visualized. Bladder is unremarkable. Stomach/Bowel: The stomach is poorly distended. Moderate to marked severity diffuse gastric wall thickening is noted. Postoperative changes consistent with prior right hemicolectomy are seen with a subsequent right-sided paramidline ileostomy site. No evidence of bowel wall thickening, distention, or inflammatory changes. Vascular/Lymphatic: Aortic atherosclerosis. No enlarged abdominal or pelvic lymph nodes. Reproductive: Prostate is unremarkable. Other: A 2.8 cm x 4.6 cm fluid-filled right inguinal hernia is seen. This is increased in size when compared to the prior study. There is a mild amount of perihepatic free fluid. A small amount of posterior pelvic free fluid is also noted. Musculoskeletal: No acute or significant osseous findings. IMPRESSION: 1. Findings consistent with acute pancreatitis. Correlation with pancreatic enzymes is recommended. 2. Cirrhosis with moderate to marked severity splenomegaly. 3. Moderate to marked severity diffuse gastric wall thickening which may represent gastritis. 4. Postoperative changes consistent with prior right hemicolectomy with a subsequent right-sided paramidline ileostomy site. 5. 2.8 cm x 4.6 cm fluid-filled right inguinal hernia. 6. Mild amount of perihepatic and posterior pelvic free fluid. 7. 5 mm nonobstructing right renal calculus. 8. Aortic atherosclerosis. Aortic  Atherosclerosis (ICD10-I70.0). Electronically Signed   By: Aram Candela M.D.   On: 06/06/2023 03:34    Scheduled Meds:  feeding supplement  1 Container Oral TID BM   gabapentin  100 mg Oral BID   gabapentin  300 mg Oral QHS   insulin aspart  0-15 Units Subcutaneous TID WC   insulin aspart  0-5 Units Subcutaneous QHS   insulin detemir  10 Units Subcutaneous Daily   midodrine  5 mg Oral TID WC   nicotine  7 mg Transdermal Daily   pantoprazole  40 mg Oral Daily   pneumococcal 20-valent conjugate vaccine  0.5 mL Intramuscular Tomorrow-1000   rifaximin  550 mg Oral BID   sertraline  50 mg Oral Daily   Continuous Infusions:  sodium chloride 1,000 mL (06/07/23 0559)     LOS: 1 day   Hughie Closs, MD [facility]  06/07/2023, 8:27 AM   *Please note that this is a verbal dictation therefore any spelling or grammatical errors are due to the "Dragon [employer] One" system interpretation.  Please page via Amion and do not message via secure chat for urgent patient care matters. Secure chat can be used for non urgent patient care matters.  How to contact the [facility] Attending or Consulting provider 7A - 7P or covering provider during after hours 7P -7A, for this patient?  Check the care team in [facility] and look for a) attending/consulting [facility] provider listed  and b) the [facility] team listed. Page or secure chat 7A-7P. Log into [email or web address] and use [facility] universal password to access. If you do not have the password, please contact the hospital operator. Locate the [facility] provider you are looking for under [facility] and page to a number that you can be directly reached. If you still have difficulty reaching the provider, please page the [facility] (Director on Call) for the Hospitalists listed on amion for assistance.

## 2023-06-07 NOTE — Progress Notes (Signed)
Mobility Specialist - Progress Note   06/07/23 1108  Mobility  Activity Ambulated independently in hallway  Level of Assistance Independent  Assistive Device None  Distance Ambulated (ft) 480 ft  Activity Response Tolerated well  Mobility Referral Yes  Mobility visit 1 Mobility  Mobility Specialist Start Time (ACUTE ONLY) 1057  Mobility Specialist Stop Time (ACUTE ONLY) 1107  Mobility Specialist Time Calculation (min) (ACUTE ONLY) 10 min   Pt received in bed and agreeable to mobility. No complaints during session. Pt to bed after session with all needs met.   [facility]

## 2023-06-07 NOTE — Plan of Care (Signed)
  Problem: Education: Goal: Knowledge of General Education information will improve Description: Including pain rating scale, medication(s)/side effects and non-pharmacologic comfort measures Outcome: Progressing   Problem: Clinical Measurements: Goal: Ability to maintain clinical measurements within normal limits will improve Outcome: Progressing Goal: Will remain free from infection Outcome: Progressing Goal: Diagnostic test results will improve Outcome: Progressing Goal: Respiratory complications will improve Outcome: Progressing Goal: Cardiovascular complication will be avoided Outcome: Progressing   Problem: Activity: Goal: Risk for activity intolerance will decrease Outcome: Progressing   Problem: Coping: Goal: Level of anxiety will decrease Outcome: Progressing   Problem: Elimination: Goal: Will not experience complications related to bowel motility Outcome: Progressing Goal: Will not experience complications related to urinary retention Outcome: Progressing   Problem: Pain Management: Goal: General experience of comfort will improve Outcome: Progressing   Problem: Safety: Goal: Ability to remain free from injury will improve Outcome: Progressing   Problem: Skin Integrity: Goal: Risk for impaired skin integrity will decrease Outcome: Progressing   Problem: Health Behavior/Discharge Planning: Goal: Ability to manage health-related needs will improve Outcome: Not Progressing   Problem: Nutrition: Goal: Adequate nutrition will be maintained Outcome: Not Progressing

## 2023-06-07 NOTE — Plan of Care (Signed)
  Problem: Education: Goal: Knowledge of General Education information will improve Description: Including pain rating scale, medication(s)/side effects and non-pharmacologic comfort measures Outcome: Progressing   Problem: Health Behavior/Discharge Planning: Goal: Ability to manage health-related needs will improve Outcome: Progressing   Problem: Clinical Measurements: Goal: Ability to maintain clinical measurements within normal limits will improve Outcome: Progressing Goal: Will remain free from infection Outcome: Progressing Goal: Diagnostic test results will improve Outcome: Progressing Goal: Respiratory complications will improve Outcome: Progressing Goal: Cardiovascular complication will be avoided Outcome: Progressing   Problem: Activity: Goal: Risk for activity intolerance will decrease Outcome: Progressing   Problem: Nutrition: Goal: Adequate nutrition will be maintained Outcome: Progressing   Problem: Coping: Goal: Level of anxiety will decrease Outcome: Progressing   Problem: Elimination: Goal: Will not experience complications related to bowel motility Outcome: Progressing Goal: Will not experience complications related to urinary retention Outcome: Progressing   Problem: Pain Management: Goal: General experience of comfort will improve Outcome: Progressing   Problem: Safety: Goal: Ability to remain free from injury will improve Outcome: Progressing   Problem: Skin Integrity: Goal: Risk for impaired skin integrity will decrease Outcome: Progressing   Problem: Education: Goal: Ability to describe self-care measures that may prevent or decrease complications (Diabetes Survival Skills Education) will improve Outcome: Progressing Goal: Individualized Educational Video(s) Outcome: Progressing   Problem: Coping: Goal: Ability to adjust to condition or change in health will improve Outcome: Progressing   Problem: Fluid Volume: Goal: Ability to  maintain a balanced intake and output will improve Outcome: Progressing   Problem: Health Behavior/Discharge Planning: Goal: Ability to identify and utilize available resources and services will improve Outcome: Progressing Goal: Ability to manage health-related needs will improve Outcome: Progressing   Problem: Metabolic: Goal: Ability to maintain appropriate glucose levels will improve Outcome: Progressing   Problem: Metabolic: Goal: Ability to maintain appropriate glucose levels will improve Outcome: Progressing   Problem: Nutritional: Goal: Maintenance of adequate nutrition will improve Outcome: Progressing Goal: Progress toward achieving an optimal weight will improve Outcome: Progressing   Problem: Skin Integrity: Goal: Risk for impaired skin integrity will decrease Outcome: Progressing   Problem: Tissue Perfusion: Goal: Adequacy of tissue perfusion will improve Outcome: Progressing

## 2023-06-07 NOTE — Progress Notes (Signed)
Hypoglycemic Event  CBG: 53  Treatment: 4 oz juice/soda  Symptoms: Shaky  Follow-up CBG: Time:0412 CBG Result:64  Treatment: 4 oz soda, Italian ice pop  Follow-up CBG: Time:0443  CBG Result: 87  Possible Reasons for Event: Inadequate meal intake  Comments/MD notified:notified [facility] NP, Chinita Greenland    Randel Books

## 2023-06-07 NOTE — Progress Notes (Addendum)
Patient informed this nurse that he has been given insulin (short/long acting) but has no insulin for home use and patient had questions about current kidney stones and treatment options during this hospitalization. Informed Dr. Jacqulyn Bath of these questions for follow up.  Patient also takes carafate at home and has not been taking here while hospitalized.

## 2023-06-08 DIAGNOSIS — K852 Alcohol induced acute pancreatitis without necrosis or infection: Secondary | ICD-10-CM | POA: Diagnosis not present

## 2023-06-08 LAB — COMPREHENSIVE METABOLIC PANEL
ALT: 15 U/L (ref 0–44)
AST: 31 U/L (ref 15–41)
Albumin: 2.5 g/dL — ABNORMAL LOW (ref 3.5–5.0)
Alkaline Phosphatase: 72 U/L (ref 38–126)
Anion gap: 5 (ref 5–15)
BUN: 27 mg/dL — ABNORMAL HIGH (ref 6–20)
CO2: 16 mmol/L — ABNORMAL LOW (ref 22–32)
Calcium: 7.9 mg/dL — ABNORMAL LOW (ref 8.9–10.3)
Chloride: 106 mmol/L (ref 98–111)
Creatinine, Ser: 1.75 mg/dL — ABNORMAL HIGH (ref 0.61–1.24)
GFR, Estimated: 45 mL/min — ABNORMAL LOW (ref >60)
Glucose, Bld: 130 mg/dL — ABNORMAL HIGH (ref 70–99)
Potassium: 4.6 mmol/L (ref 3.5–5.1)
Sodium: 127 mmol/L — ABNORMAL LOW (ref 135–145)
Total Bilirubin: 2.3 mg/dL — ABNORMAL HIGH (ref <1.2)
Total Protein: 5.6 g/dL — ABNORMAL LOW (ref 6.5–8.1)

## 2023-06-08 LAB — CBC WITH DIFFERENTIAL/PLATELET
Abs Immature Granulocytes: 0 10*3/uL (ref 0.00–0.07)
Basophils Absolute: 0 10*3/uL (ref 0.0–0.1)
Basophils Relative: 1 %
Eosinophils Absolute: 0.1 10*3/uL (ref 0.0–0.5)
Eosinophils Relative: 3 %
HCT: 18.9 % — ABNORMAL LOW (ref 39.0–52.0)
Hemoglobin: 6.2 g/dL — CL (ref 13.0–17.0)
Immature Granulocytes: 0 %
Lymphocytes Relative: 17 %
Lymphs Abs: 0.3 10*3/uL — ABNORMAL LOW (ref 0.7–4.0)
MCH: 32.1 pg (ref 26.0–34.0)
MCHC: 32.8 g/dL (ref 30.0–36.0)
MCV: 97.9 fL (ref 80.0–100.0)
Monocytes Absolute: 0.2 10*3/uL (ref 0.1–1.0)
Monocytes Relative: 13 %
Neutro Abs: 1.1 10*3/uL — ABNORMAL LOW (ref 1.7–7.7)
Neutrophils Relative %: 66 %
Platelets: 33 10*3/uL — ABNORMAL LOW (ref 150–400)
RBC: 1.93 MIL/uL — ABNORMAL LOW (ref 4.22–5.81)
RDW: 15.9 % — ABNORMAL HIGH (ref 11.5–15.5)
WBC: 1.7 10*3/uL — ABNORMAL LOW (ref 4.0–10.5)
nRBC: 0 % (ref 0.0–0.2)

## 2023-06-08 LAB — GLUCOSE, CAPILLARY
Glucose-Capillary: 105 mg/dL — ABNORMAL HIGH (ref 70–99)
Glucose-Capillary: 166 mg/dL — ABNORMAL HIGH (ref 70–99)
Glucose-Capillary: 130 mg/dL — ABNORMAL HIGH (ref 70–99)
Glucose-Capillary: 117 mg/dL — ABNORMAL HIGH (ref 70–99)

## 2023-06-08 LAB — HEMOGLOBIN AND HEMATOCRIT, BLOOD
HCT: 22.3 % — ABNORMAL LOW (ref 39.0–52.0)
Hemoglobin: 7.4 g/dL — ABNORMAL LOW (ref 13.0–17.0)

## 2023-06-08 LAB — PREPARE RBC (CROSSMATCH)

## 2023-06-08 MED ORDER — PANTOPRAZOLE SODIUM 40 MG PO TBEC
40.0000 mg | DELAYED_RELEASE_TABLET | Freq: Two times a day (BID) | ORAL | Status: DC
  Administered 2023-06-08 – 2023-06-12 (×8): 40 mg via ORAL
  Filled 2023-06-08 (×8): qty 1

## 2023-06-08 MED ORDER — SODIUM CHLORIDE 0.9% IV SOLUTION: Freq: Once | INTRAVENOUS | Status: AC

## 2023-06-08 NOTE — Progress Notes (Signed)
PROGRESS NOTE    ARKHAM BORROMEO  OZD:[id number] DOB: 17-Jan-1966 DOA: 06/06/2023 PCP: Nelwyn Salisbury, MD   Brief Narrative:  HPI: Martin Anthony is a 57 y.o. male with medical history significant of decompensated alcoholic cirrhosis, GERD, hypertension, hyperlipidemia, colon cancer status post colectomy, type 2 diabetes who presents emergency department with severe epigastric pain.  Patient states symptoms been going on for last 4 days with poor p.o. intake.  Due to severe pain he presented to the ER where he was found to be afebrile hemodynamically stable.  Abs were obtained on presentation which demonstrated sodium 121, potassium 5.5, glucose 686, creatinine 2.9 at baseline, AST 35, ALT 20, lipase 4900, WBC 4.0, hemoglobin 8, urinalysis negative for infection, pH 7.33, INR 1.6.  Patient underwent CT abdomen pelvis which demonstrated acute pancreatitis with cirrhotic nephrology as well as gastritis.  Patient was started on IV fluids and pain control admitted for the management of acute pancreatitis.   Assessment & Plan:   Principal Problem:   Acute pancreatitis Active Problems:   Type 2 diabetes mellitus with neurological complications (HCC)   PAF (paroxysmal atrial fibrillation) (HCC)   Depression with anxiety   Elevated LFTs   Thrombocytopenia (HCC)  Acute pancreatitis, POA: Patient denies using alcohol since the end of September.  I am not sure if he is continuing to drink.  MRCP negative for choledocholithiasis.  Gallbladder wall thickening but this is nonspecific in the setting of cirrhosis with portal hypertension and ascites.  Triglycerides normal.  Gallstone ruled out.  Pain is almost the same like yesterday, 6 out of 10.  Knowing patient from recent hospitalization, his pain never gets better below 6.  He is requesting to advance diet.  No nausea.  Will advance to soft diet.  History of type 2 diabetes mellitus with hyperglycemia: Interestingly, his hemoglobin A1c has remained either  normal or in prediabetes range for last 4 years with recent 6.2 just 2 weeks ago when he was hospitalized but he presented with significant hyperglycemia and blood sugar of 686 upon arrival.  Of note, during recent hospitalization as well, upon presentation, his blood sugar was in 500 range and he was managed with only SSI while inpatient.  He was started on 15 units of Semglee.  Patient became hypoglycemic, Semglee reduced to 10 units and blood sugars controlled currently.  Continue SSI.  Acute blood loss anemia on anemia of chronic disease/gastritis/history of duodenitis: Likely secondary to chronic alcoholism and recurrent pancreatitis.  Hemoglobin dropped to 6.2, transfused 1 unit today.  Continue twice daily PPI.  Check CBC daily.  History of alcoholic liver cirrhosis with ascites: Appears to be stable.  Alcohol induced pancytopenia: Stable.  Monitor closely.  Paroxysmal atrial fibrillation: Not a candidate for anticoagulation due to GI bleed.  Hyperkalemia: Resolved.  Acute on chronic hyponatremia: Patient's sodium at baseline remains between 126-132.  Currently at within baseline.  AKI on CKD stage IIIb: Baseline creatinine around 1.9 however creatinine rose to 4.83 during recent hospitalization and at the time of discharge, it was 3.2.  Fortunately, his creatinine is better than that and 2.94 upon arrival and further improved to 1. 75 today. Will monitor daily.  Avoid nephrotoxic agents.  History of sinus bradycardia: We discontinued his beta-blocker during recent hospitalization.  Liver lesion/mass: Indeterminate and equivocal 1.2 cm rim enhancing lesion within segment 3 of the liver. Recommend follow-up dedicated multiphase liver MRI in 3-6 months to ensure stability.  Nephrolithiasis: CT renal study showed 5 mm nonobstructing  right renal calculus.  Patient is asymptomatic.  DVT prophylaxis: Place and maintain sequential compression device Start: 06/06/23 1027 SCDs Start: 06/06/23  0517   Code Status: Full Code  Family Communication:  None present at bedside.  Plan of care discussed with patient in length and he/she verbalized understanding and agreed with it.  Status is: Inpatient Remains inpatient appropriate because: Pancreatitis/anemia requiring blood transfusion, improving gradually.  Estimated body mass index is 20.56 kg/m as calculated from the following:   Height as of this encounter: 6\' 4"  (1.93 m).   Weight as of this encounter: 76.6 kg.    Nutritional Assessment: Body mass index is 20.56 kg/m.Marland Kitchen Seen by dietician.  I agree with the assessment and plan as outlined below: Nutrition Status:        . Skin Assessment: I have examined the patient's skin and I agree with the wound assessment as performed by the wound care RN as outlined below:    Consultants:  None  Procedures:  None  Antimicrobials:  Anti-infectives (From admission, onward)    Start     Dose/Rate Route Frequency Ordered Stop   06/06/23 1000  rifaximin (XIFAXAN) tablet 550 mg        550 mg Oral 2 times daily 06/06/23 0518           Subjective: Seen and examined earlier today.  Was complaining of abdominal pain, similar to yesterday 6 out of 10.  No other complaint.  Objective: Vitals:   06/08/23 0536 06/08/23 1114 06/08/23 1135 06/08/23 1357  BP: (!) 135/57 123/69 116/66 139/64  Pulse: 70 67 63 65  Resp: 18  16 19   Temp: 98.2 F (36.8 C) 98.2 F (36.8 C) 98.5 F (36.9 C) 98.2 F (36.8 C)  TempSrc: Oral Oral  Oral  SpO2: 100% 100% 100% 100%  Weight:      Height:        Intake/Output Summary (Last 24 hours) at 06/08/2023 1624 Last data filed at 06/08/2023 1502 Gross per 24 hour  Intake 4867.85 ml  Output --  Net 4867.85 ml   Filed Weights   06/06/23 1749  Weight: 76.6 kg    Examination:  General exam: Appears calm and comfortable  Respiratory system: Clear to auscultation. Respiratory effort normal. Cardiovascular system: S1 & S2 heard, RRR. No  JVD, murmurs, rubs, gallops or clicks. No pedal edema. Gastrointestinal system: Abdomen is nondistended, soft and epigastric tenderness. No organomegaly or masses felt. Normal bowel sounds heard. Central nervous system: Alert and oriented. No focal neurological deficits. Extremities: Symmetric 5 x 5 power. Skin: No rashes, lesions or ulcers.  Psychiatry: Judgement and insight appear normal. Mood & affect appropriate.   Data Reviewed: I have personally reviewed following labs and imaging studies  CBC: Recent Labs  Lab 06/06/23 0026 06/07/23 0603 06/08/23 0612 06/08/23 1603  WBC 4.0 3.3* 1.7*  --   NEUTROABS 3.3  --  1.1*  --   HGB 8.0* 7.5* 6.2* 7.4*  HCT 23.4* 22.1* 18.9* 22.3*  MCV 95.5 96.9 97.9  --   PLT 49* 38* 33*  --    Basic Metabolic Panel: Recent Labs  Lab 06/06/23 0026 06/06/23 1630 06/07/23 0603 06/08/23 0612  NA 121* 127* 125* 127*  K 5.5* 4.5 4.9 4.6  CL 98 105 102 106  CO2 15* 16* 15* 16*  GLUCOSE 686* 170* 178* 130*  BUN 52* 42* 34* 27*  CREATININE 2.94* 2.29* 1.99* 1.75*  CALCIUM 8.8* 8.3* 8.3* 7.9*   GFR: Estimated Creatinine  Clearance: 51.1 mL/min (A) (by C-G formula based on SCr of 1.75 mg/dL (H)). Liver Function Tests: Recent Labs  Lab 06/06/23 0026 06/07/23 0603 06/08/23 0612  AST 35 32 31  ALT 20 17 15   ALKPHOS 118 64 72  BILITOT 3.5* 3.6* 2.3*  PROT 7.4 6.7 5.6*  ALBUMIN 3.3* 2.9* 2.5*   Recent Labs  Lab 06/06/23 0026  LIPASE 4,907*   No results for input(s): "AMMONIA" in the last 168 hours. Coagulation Profile: Recent Labs  Lab 06/06/23 0035  INR 1.6*   Cardiac Enzymes: No results for input(s): "CKTOTAL", "CKMB", "CKMBINDEX", "TROPONINI" in the last 168 hours. BNP (last 3 results) No results for input(s): "PROBNP" in the last 8760 hours. HbA1C: No results for input(s): "HGBA1C" in the last 72 hours. CBG: Recent Labs  Lab 06/07/23 1134 06/07/23 1602 06/07/23 2154 06/08/23 0753 06/08/23 1117  GLUCAP 159* 136* 133*  105* 166*   Lipid Profile: Recent Labs    06/06/23 1630  TRIG 53   Thyroid Function Tests: No results for input(s): "TSH", "T4TOTAL", "FREET4", "T3FREE", "THYROIDAB" in the last 72 hours. Anemia Panel: No results for input(s): "VITAMINB12", "FOLATE", "FERRITIN", "TIBC", "IRON", "RETICCTPCT" in the last 72 hours. Sepsis Labs: No results for input(s): "PROCALCITON", "LATICACIDVEN" in the last 168 hours.  No results found for this or any previous visit (from the past 240 hours).   Radiology Studies: No results found.   Scheduled Meds:  feeding supplement  1 Container Oral TID BM   folic acid  1 mg Oral Daily   gabapentin  100 mg Oral BID   gabapentin  300 mg Oral QHS   insulin aspart  0-15 Units Subcutaneous TID WC   insulin aspart  0-5 Units Subcutaneous QHS   insulin detemir  10 Units Subcutaneous Daily   midodrine  5 mg Oral TID WC   nicotine  7 mg Transdermal Daily   pantoprazole  40 mg Oral BID   pneumococcal 20-valent conjugate vaccine  0.5 mL Intramuscular Tomorrow-1000   rifaximin  550 mg Oral BID   sertraline  50 mg Oral Daily   sucralfate  1 g Oral TID WC & HS   thiamine  100 mg Oral Daily   Continuous Infusions:  sodium chloride 1,000 mL (06/08/23 1553)     LOS: 2 days   Hughie Closs, MD [facility]  06/08/2023, 4:24 PM   *Please note that this is a verbal dictation therefore any spelling or grammatical errors are due to the "[employer] One" system interpretation.  Please page via Amion and do not message via secure chat for urgent patient care matters. Secure chat can be used for non urgent patient care matters.  How to contact the [facility] Attending or Consulting provider 7A - 7P or covering provider during after hours 7P -7A, for this patient?  Check the care team in [facility] and look for a) attending/consulting [facility] provider listed and b) the [facility] team listed. Page or secure chat [phone]-7P. Log into [email or web address] and use [place]'s universal password  to access. If you do not have the password, please contact the hospital operator. Locate the [facility] provider you are looking for under [facility] and page to a number that you can be directly reached. If you still have difficulty reaching the provider, please page the [facility] (Director on Call) for the Hospitalists listed on amion for assistance.

## 2023-06-08 NOTE — Plan of Care (Signed)

## 2023-06-09 DIAGNOSIS — K852 Alcohol induced acute pancreatitis without necrosis or infection: Secondary | ICD-10-CM | POA: Diagnosis not present

## 2023-06-09 LAB — GLUCOSE, CAPILLARY
Glucose-Capillary: 133 mg/dL — ABNORMAL HIGH (ref 70–99)
Glucose-Capillary: 166 mg/dL — ABNORMAL HIGH (ref 70–99)
Glucose-Capillary: 73 mg/dL (ref 70–99)
Glucose-Capillary: 97 mg/dL (ref 70–99)

## 2023-06-09 LAB — CBC WITH DIFFERENTIAL/PLATELET
Abs Immature Granulocytes: 0 10*3/uL (ref 0.00–0.07)
Basophils Absolute: 0 10*3/uL (ref 0.0–0.1)
Basophils Relative: 1 %
Eosinophils Absolute: 0 10*3/uL (ref 0.0–0.5)
Eosinophils Relative: 3 %
HCT: 20.5 % — ABNORMAL LOW (ref 39.0–52.0)
Hemoglobin: 6.6 g/dL — CL (ref 13.0–17.0)
Immature Granulocytes: 0 %
Lymphocytes Relative: 14 %
Lymphs Abs: 0.2 10*3/uL — ABNORMAL LOW (ref 0.7–4.0)
MCH: 32.4 pg (ref 26.0–34.0)
MCHC: 32.2 g/dL (ref 30.0–36.0)
MCV: 100.5 fL — ABNORMAL HIGH (ref 80.0–100.0)
Monocytes Absolute: 0.2 10*3/uL (ref 0.1–1.0)
Monocytes Relative: 12 %
Neutro Abs: 1 10*3/uL — ABNORMAL LOW (ref 1.7–7.7)
Neutrophils Relative %: 70 %
Platelets: 37 10*3/uL — ABNORMAL LOW (ref 150–400)
RBC: 2.04 MIL/uL — ABNORMAL LOW (ref 4.22–5.81)
RDW: 15.8 % — ABNORMAL HIGH (ref 11.5–15.5)
WBC: 1.4 10*3/uL — CL (ref 4.0–10.5)
nRBC: 0 % (ref 0.0–0.2)

## 2023-06-09 LAB — COMPREHENSIVE METABOLIC PANEL
ALT: 14 U/L (ref 0–44)
AST: 29 U/L (ref 15–41)
Albumin: 2.3 g/dL — ABNORMAL LOW (ref 3.5–5.0)
Alkaline Phosphatase: 66 U/L (ref 38–126)
Anion gap: 5 (ref 5–15)
BUN: 25 mg/dL — ABNORMAL HIGH (ref 6–20)
CO2: 14 mmol/L — ABNORMAL LOW (ref 22–32)
Calcium: 8.2 mg/dL — ABNORMAL LOW (ref 8.9–10.3)
Chloride: 114 mmol/L — ABNORMAL HIGH (ref 98–111)
Creatinine, Ser: 1.69 mg/dL — ABNORMAL HIGH (ref 0.61–1.24)
GFR, Estimated: 47 mL/min — ABNORMAL LOW (ref >60)
Glucose, Bld: 152 mg/dL — ABNORMAL HIGH (ref 70–99)
Potassium: 4.7 mmol/L (ref 3.5–5.1)
Sodium: 133 mmol/L — ABNORMAL LOW (ref 135–145)
Total Bilirubin: 2.4 mg/dL — ABNORMAL HIGH (ref <1.2)
Total Protein: 5.2 g/dL — ABNORMAL LOW (ref 6.5–8.1)

## 2023-06-09 LAB — HEMOGLOBIN AND HEMATOCRIT, BLOOD
HCT: 22.9 % — ABNORMAL LOW (ref 39.0–52.0)
Hemoglobin: 7.7 g/dL — ABNORMAL LOW (ref 13.0–17.0)

## 2023-06-09 LAB — PREPARE RBC (CROSSMATCH)

## 2023-06-09 MED ORDER — ORAL CARE MOUTH RINSE: 15.0000 mL | OROMUCOSAL | Status: DC | PRN

## 2023-06-09 MED ORDER — SODIUM CHLORIDE 0.9% IV SOLUTION: Freq: Once | INTRAVENOUS | Status: AC

## 2023-06-09 NOTE — Progress Notes (Signed)
PROGRESS NOTE    Martin Anthony  YQI:[id number] DOB: [birth year] DOA: 06/06/2023 PCP: Nelwyn Salisbury, MD   Brief Narrative:  HPI: Martin Anthony is a 57 y.o. male with medical history significant of decompensated alcoholic cirrhosis, GERD, hypertension, hyperlipidemia, colon cancer status post colectomy, type 2 diabetes who presents emergency department with severe epigastric pain.  Patient states symptoms been going on for last 4 days with poor p.o. intake.  Due to severe pain he presented to the ER where he was found to be afebrile hemodynamically stable.  Abs were obtained on presentation which demonstrated sodium 121, potassium 5.5, glucose 686, creatinine 2.9 at baseline, AST 35, ALT 20, lipase 4900, WBC 4.0, hemoglobin 8, urinalysis negative for infection, pH 7.33, INR 1.6.  Patient underwent CT abdomen pelvis which demonstrated acute pancreatitis with cirrhotic nephrology as well as gastritis.  Patient was started on IV fluids and pain control admitted for the management of acute pancreatitis.   Assessment & Plan:   Principal Problem:   Acute pancreatitis Active Problems:   Type 2 diabetes mellitus with neurological complications (HCC)   PAF (paroxysmal atrial fibrillation) (HCC)   Depression with anxiety   Elevated LFTs   Thrombocytopenia (HCC)  Acute pancreatitis, POA: Patient denies using alcohol since the end of September.  I am not sure if he is continuing to drink.  MRCP negative for choledocholithiasis.  Gallbladder wall thickening but this is nonspecific in the setting of cirrhosis with portal hypertension and ascites.  Triglycerides normal.  Gallstone ruled out.  Pain is almost the same like yesterday.  Knowing patient from recent hospitalization, his pain never gets better below 6.  Tolerating soft diet.  Will continue that and discontinue IV fluids.  History of type 2 diabetes mellitus with hyperglycemia: Interestingly, his hemoglobin A1c has remained either normal or in  prediabetes range for last 4 years with recent 6.2 just 2 weeks ago when he was hospitalized but he presented with significant hyperglycemia and blood sugar of 686 upon arrival.  Of note, during recent hospitalization as well, upon presentation, his blood sugar was in 500 range and he was managed with only SSI while inpatient.  He was started on 15 units of Semglee.  Patient became hypoglycemic, Semglee reduced to 10 units and blood sugars controlled currently.  Continue SSI.  Acute blood loss anemia on anemia of chronic disease/gastritis/history of duodenitis: Likely secondary to chronic alcoholism and recurrent pancreatitis.  Hemoglobin dropped to 6.2 required 1 unit of PRBC transfusion on 06/08/2023, hemoglobin improved but dropped again to 6.6, transfusing 1 more unit today.  Continue twice daily PPI.  Monitor CBC daily.  May warrant EGD if continues to require blood transfusion however his platelets below 50 may hinder that.  Of note, he was recently seen by GI during recent hospitalization for similar complaints and GI recommended conservative management.  Reassess tomorrow.  History of alcoholic liver cirrhosis with ascites: Appears to be stable.  Alcohol induced pancytopenia: Stable.  Monitor closely.  Paroxysmal atrial fibrillation: Not a candidate for anticoagulation due to GI bleed.  Hyperkalemia: Resolved.  Acute on chronic hyponatremia: Patient's sodium at baseline remains between 126-132.  Currently at within baseline.  AKI on CKD stage IIIb: Baseline creatinine around 1.9 however creatinine rose to 4.83 during recent hospitalization and at the time of discharge, it was 3.2.  Fortunately, his creatinine is better than that and 2.94 upon arrival and further improved to 1. 69 today. Will monitor daily.  Avoid nephrotoxic agents.  History of sinus bradycardia: We discontinued his beta-blocker during recent hospitalization.  Liver lesion/mass: Indeterminate and equivocal 1.2 cm rim  enhancing lesion within segment 3 of the liver. Recommend follow-up dedicated multiphase liver MRI in 3-6 months to ensure stability.  Nephrolithiasis: CT renal study showed 5 mm nonobstructing right renal calculus.  Patient is asymptomatic.  DVT prophylaxis: Place and maintain sequential compression device Start: 06/06/23 1027 SCDs Start: 06/06/23 0517   Code Status: Full Code  Family Communication:  None present at bedside.  Plan of care discussed with patient in length and he/she verbalized understanding and agreed with it.  Status is: Inpatient Remains inpatient appropriate because: Pancreatitis/anemia requiring blood transfusion, improving gradually.  Estimated body mass index is 20.56 kg/m as calculated from the following:   Height as of this encounter: 6\' 4"  (1.93 m).   Weight as of this encounter: 76.6 kg.    Nutritional Assessment: Body mass index is 20.56 kg/m.Marland Kitchen Seen by dietician.  I agree with the assessment and plan as outlined below: Nutrition Status:        . Skin Assessment: I have examined the patient's skin and I agree with the wound assessment as performed by the wound care RN as outlined below:    Consultants:  None  Procedures:  None  Antimicrobials:  Anti-infectives (From admission, onward)    Start     Dose/Rate Route Frequency Ordered Stop   06/06/23 1000  rifaximin (XIFAXAN) tablet 550 mg        550 mg Oral 2 times daily 06/06/23 0518           Subjective: Seen and examined.  Abdominal pain is about the same like yesterday.  No other complaint.  Objective: Vitals:   06/08/23 2053 06/08/23 2056 06/09/23 0300 06/09/23 0514  BP:  110/62  (!) 124/50  Pulse:  66  65  Resp:  16 16 14   Temp:  98.9 F (37.2 C)  98.5 F (36.9 C)  TempSrc:  Oral  Oral  SpO2: 100% 99%  97%  Weight:      Height:        Intake/Output Summary (Last 24 hours) at 06/09/2023 0811 Last data filed at 06/09/2023 0100 Gross per 24 hour  Intake 4450.26 ml   Output --  Net 4450.26 ml   Filed Weights   06/06/23 1749  Weight: 76.6 kg    Examination:  General exam: Appears calm and comfortable  Respiratory system: Clear to auscultation. Respiratory effort normal. Cardiovascular system: S1 & S2 heard, RRR. No JVD, murmurs, rubs, gallops or clicks. No pedal edema. Gastrointestinal system: Abdomen is nondistended, soft and upper abdominal tenderness. No organomegaly or masses felt. Normal bowel sounds heard. Central nervous system: Alert and oriented. No focal neurological deficits. Extremities: Symmetric 5 x 5 power. Skin: No rashes, lesions or ulcers.  Psychiatry: Judgement and insight appear normal. Mood & affect appropriate.    Data Reviewed: I have personally reviewed following labs and imaging studies  CBC: Recent Labs  Lab 06/06/23 0026 06/07/23 0603 06/08/23 0612 06/08/23 1603 06/09/23 0533  WBC 4.0 3.3* 1.7*  --  1.4*  NEUTROABS 3.3  --  1.1*  --  1.0*  HGB 8.0* 7.5* 6.2* 7.4* 6.6*  HCT 23.4* 22.1* 18.9* 22.3* 20.5*  MCV 95.5 96.9 97.9  --  100.5*  PLT 49* 38* 33*  --  37*   Basic Metabolic Panel: Recent Labs  Lab 06/06/23 0026 06/06/23 1630 06/07/23 0603 06/08/23 0612 06/09/23 0533  NA 121* 127*  125* 127* 133*  K 5.5* 4.5 4.9 4.6 4.7  CL 98 105 102 106 114*  CO2 15* 16* 15* 16* 14*  GLUCOSE 686* 170* 178* 130* 152*  BUN 52* 42* 34* 27* 25*  CREATININE 2.94* 2.29* 1.99* 1.75* 1.69*  CALCIUM 8.8* 8.3* 8.3* 7.9* 8.2*   GFR: Estimated Creatinine Clearance: 52.9 mL/min (A) (by C-G formula based on SCr of 1.69 mg/dL (H)). Liver Function Tests: Recent Labs  Lab 06/06/23 0026 06/07/23 0603 06/08/23 0612 06/09/23 0533  AST 35 32 31 29  ALT 20 17 15 14   ALKPHOS 118 64 72 66  BILITOT 3.5* 3.6* 2.3* 2.4*  PROT 7.4 6.7 5.6* 5.2*  ALBUMIN 3.3* 2.9* 2.5* 2.3*   Recent Labs  Lab 06/06/23 0026  LIPASE 4,907*   No results for input(s): "AMMONIA" in the last 168 hours. Coagulation Profile: Recent Labs  Lab  06/06/23 0035  INR 1.6*   Cardiac Enzymes: No results for input(s): "CKTOTAL", "CKMB", "CKMBINDEX", "TROPONINI" in the last 168 hours. BNP (last 3 results) No results for input(s): "PROBNP" in the last 8760 hours. HbA1C: No results for input(s): "HGBA1C" in the last 72 hours. CBG: Recent Labs  Lab 06/08/23 0753 06/08/23 1117 06/08/23 1707 06/08/23 2101 06/09/23 0758  GLUCAP 105* 166* 130* 117* 133*   Lipid Profile: Recent Labs    06/06/23 1630  TRIG 53   Thyroid Function Tests: No results for input(s): "TSH", "T4TOTAL", "FREET4", "T3FREE", "THYROIDAB" in the last 72 hours. Anemia Panel: No results for input(s): "VITAMINB12", "FOLATE", "FERRITIN", "TIBC", "IRON", "RETICCTPCT" in the last 72 hours. Sepsis Labs: No results for input(s): "PROCALCITON", "LATICACIDVEN" in the last 168 hours.  No results found for this or any previous visit (from the past 240 hours).   Radiology Studies: No results found.   Scheduled Meds:  sodium chloride   Intravenous Once   feeding supplement  1 Container Oral TID BM   folic acid  1 mg Oral Daily   gabapentin  100 mg Oral BID   gabapentin  300 mg Oral QHS   insulin aspart  0-15 Units Subcutaneous TID WC   insulin aspart  0-5 Units Subcutaneous QHS   insulin detemir  10 Units Subcutaneous Daily   midodrine  5 mg Oral TID WC   nicotine  7 mg Transdermal Daily   pantoprazole  40 mg Oral BID   pneumococcal 20-valent conjugate vaccine  0.5 mL Intramuscular Tomorrow-1000   rifaximin  550 mg Oral BID   sertraline  50 mg Oral Daily   sucralfate  1 g Oral TID WC & HS   thiamine  100 mg Oral Daily   Continuous Infusions:  sodium chloride 1,000 mL (06/09/23 0100)     LOS: 3 days   Hughie Closs, MD [facility]  06/09/2023, 8:11 AM   *Please note that this is a verbal dictation therefore any spelling or grammatical errors are due to the "Dragon Medical One" system interpretation.  Please page via Amion and do not message  via secure chat for urgent patient care matters. Secure chat can be used for non urgent patient care matters.  How to contact the [facility] Attending or Consulting provider 7A - 7P or covering provider during after hours 7P -7A, for this patient?  Check the care team in [facility] and look for a) attending/consulting TRH provider listed and b) the [facility] team listed. Page or secure chat 7A-7P. Log into [email or web address] and use [place]'s universal password to access. If you do not have  the password, please contact the hospital operator. Locate the [facility] provider you are looking for under [facility] and page to a number that you can be directly reached. If you still have difficulty reaching the provider, please page the [facility] (Director on Call) for the Hospitalists listed on amion for assistance.

## 2023-06-09 NOTE — Progress Notes (Signed)
Martin Greenland NP notified that Patients platelets are 33. Recheck ordered.

## 2023-06-09 NOTE — Progress Notes (Signed)
Patients Hemoglobin 6.6, WBCs 1.4 and last platelet 33.

## 2023-06-10 DIAGNOSIS — K852 Alcohol induced acute pancreatitis without necrosis or infection: Secondary | ICD-10-CM | POA: Diagnosis not present

## 2023-06-10 LAB — BASIC METABOLIC PANEL
Anion gap: 4 — ABNORMAL LOW (ref 5–15)
BUN: 24 mg/dL — ABNORMAL HIGH (ref 6–20)
CO2: 16 mmol/L — ABNORMAL LOW (ref 22–32)
Calcium: 8.7 mg/dL — ABNORMAL LOW (ref 8.9–10.3)
Chloride: 114 mmol/L — ABNORMAL HIGH (ref 98–111)
Creatinine, Ser: 1.89 mg/dL — ABNORMAL HIGH (ref 0.61–1.24)
GFR, Estimated: 41 mL/min — ABNORMAL LOW (ref >60)
Glucose, Bld: 127 mg/dL — ABNORMAL HIGH (ref 70–99)
Potassium: 4.8 mmol/L (ref 3.5–5.1)
Sodium: 134 mmol/L — ABNORMAL LOW (ref 135–145)

## 2023-06-10 LAB — CBC WITH DIFFERENTIAL/PLATELET
Abs Immature Granulocytes: 0.01 10*3/uL (ref 0.00–0.07)
Basophils Absolute: 0 10*3/uL (ref 0.0–0.1)
Basophils Relative: 1 %
Eosinophils Absolute: 0.1 10*3/uL (ref 0.0–0.5)
Eosinophils Relative: 3 %
HCT: 24.5 % — ABNORMAL LOW (ref 39.0–52.0)
Hemoglobin: 8.2 g/dL — ABNORMAL LOW (ref 13.0–17.0)
Immature Granulocytes: 1 %
Lymphocytes Relative: 14 %
Lymphs Abs: 0.2 10*3/uL — ABNORMAL LOW (ref 0.7–4.0)
MCH: 32 pg (ref 26.0–34.0)
MCHC: 33.5 g/dL (ref 30.0–36.0)
MCV: 95.7 fL (ref 80.0–100.0)
Monocytes Absolute: 0.2 10*3/uL (ref 0.1–1.0)
Monocytes Relative: 11 %
Neutro Abs: 1.1 10*3/uL — ABNORMAL LOW (ref 1.7–7.7)
Neutrophils Relative %: 70 %
Platelets: 42 10*3/uL — ABNORMAL LOW (ref 150–400)
RBC: 2.56 MIL/uL — ABNORMAL LOW (ref 4.22–5.81)
RDW: 16.2 % — ABNORMAL HIGH (ref 11.5–15.5)
Smear Review: DECREASED
WBC: 1.6 10*3/uL — ABNORMAL LOW (ref 4.0–10.5)
nRBC: 0 % (ref 0.0–0.2)

## 2023-06-10 LAB — TYPE AND SCREEN
ABO/RH(D): O POS
Antibody Screen: NEGATIVE
Unit division: 0
Unit division: 0

## 2023-06-10 LAB — BPAM RBC
Blood Product Expiration Date: 202501242359
Blood Product Expiration Date: 202501252359
ISSUE DATE / TIME: 202412241109
ISSUE DATE / TIME: 202412250850
Unit Type and Rh: 5100
Unit Type and Rh: 5100

## 2023-06-10 LAB — GLUCOSE, CAPILLARY
Glucose-Capillary: 114 mg/dL — ABNORMAL HIGH (ref 70–99)
Glucose-Capillary: 175 mg/dL — ABNORMAL HIGH (ref 70–99)
Glucose-Capillary: 169 mg/dL — ABNORMAL HIGH (ref 70–99)
Glucose-Capillary: 121 mg/dL — ABNORMAL HIGH (ref 70–99)

## 2023-06-10 NOTE — Progress Notes (Signed)
PROGRESS NOTE    TREAVOR UN  UVO:[id number] DOB: [birth year] DOA: 06/06/2023 PCP: Nelwyn Salisbury, MD   Brief Narrative:  HPI: Martin Anthony is a 57 y.o. male with medical history significant of decompensated alcoholic cirrhosis, GERD, hypertension, hyperlipidemia, colon cancer status post colectomy, type 2 diabetes who presents emergency department with severe epigastric pain.  Patient states symptoms been going on for last 4 days with poor p.o. intake.  Due to severe pain he presented to the ER where he was found to be afebrile hemodynamically stable.  Abs were obtained on presentation which demonstrated sodium 121, potassium 5.5, glucose 686, creatinine 2.9 at baseline, AST 35, ALT 20, lipase 4900, WBC 4.0, hemoglobin 8, urinalysis negative for infection, pH 7.33, INR 1.6.  Patient underwent CT abdomen pelvis which demonstrated acute pancreatitis with cirrhotic nephrology as well as gastritis.  Patient was started on IV fluids and pain control admitted for the management of acute pancreatitis.   Assessment & Plan:   Principal Problem:   Acute pancreatitis Active Problems:   Type 2 diabetes mellitus with neurological complications (HCC)   PAF (paroxysmal atrial fibrillation) (HCC)   Depression with anxiety   Elevated LFTs   Thrombocytopenia (HCC)  Acute pancreatitis, POA: Patient denies using alcohol since the end of September.  I am not sure if he is continuing to drink.  MRCP negative for choledocholithiasis.  Gallbladder wall thickening but this is nonspecific in the setting of cirrhosis with portal hypertension and ascites.  Triglycerides normal.  Gallstone ruled out.  Pain is almost the same like yesterday.  Knowing patient from recent hospitalization, his pain never gets better below 6.  Tolerating soft diet.  Will continue that.  History of type 2 diabetes mellitus with hyperglycemia: Interestingly, his hemoglobin A1c has remained either normal or in prediabetes range for last 4  years with recent 6.2 just 2 weeks ago when he was hospitalized but he presented with significant hyperglycemia and blood sugar of 686 upon arrival.  Of note, during recent hospitalization as well, upon presentation, his blood sugar was in 500 range and he was managed with only SSI while inpatient.  He was started on 15 units of Semglee.  Patient became hypoglycemic, Semglee reduced to 10 units and blood sugars controlled currently.  Continue SSI.  Acute blood loss anemia on anemia of chronic disease/gastritis/history of duodenitis: Likely secondary to chronic alcoholism and recurrent pancreatitis.  Hemoglobin dropped to 6.2 required 1 unit of PRBC transfusion on 06/08/2023, hemoglobin improved but dropped again to 6.6 requiring second unit of transfusion.  Hemoglobin >8 today. continue twice daily PPI.  Monitor CBC daily.  May warrant EGD if continues to require blood transfusion however his platelets below 50 may hinder that.  Of note, he was recently seen by GI during recent hospitalization for similar complaints and GI recommended conservative management.  Reassess tomorrow.  History of alcoholic liver cirrhosis with ascites: Appears to be stable.  Alcohol induced pancytopenia: Stable.  Monitor closely.  Paroxysmal atrial fibrillation: Not a candidate for anticoagulation due to GI bleed.  Hyperkalemia: Resolved.  Acute on chronic hyponatremia: Patient's sodium at baseline remains between 126-132.  Currently at within baseline.  AKI on CKD stage IIIb: Baseline creatinine around 1.9 however creatinine rose to 4.83 during recent hospitalization and at the time of discharge, it was 3.2.  Fortunately, his creatinine is better than that and 2.94 upon arrival and further improved to 1. 89 today. Will monitor daily.  Avoid nephrotoxic agents.  History  of sinus bradycardia: We discontinued his beta-blocker during recent hospitalization.  Liver lesion/mass: Indeterminate and equivocal 1.2 cm rim  enhancing lesion within segment 3 of the liver. Recommend follow-up dedicated multiphase liver MRI in 3-6 months to ensure stability.  Nephrolithiasis: CT renal study showed 5 mm nonobstructing right renal calculus.  Patient is asymptomatic.  DVT prophylaxis: Place and maintain sequential compression device Start: 06/06/23 1027 SCDs Start: 06/06/23 0517   Code Status: Full Code  Family Communication:  None present at bedside.  Plan of care discussed with patient in length and he/she verbalized understanding and agreed with it.  Status is: Inpatient Remains inpatient appropriate because: Pancreatitis/anemia requiring blood transfusion, improving gradually.  Estimated body mass index is 20.56 kg/m as calculated from the following:   Height as of this encounter: 6\' 4"  (1.93 m).   Weight as of this encounter: 76.6 kg.    Nutritional Assessment: Body mass index is 20.56 kg/m.Marland Kitchen Seen by dietician.  I agree with the assessment and plan as outlined below: Nutrition Status:        . Skin Assessment: I have examined the patient's skin and I agree with the wound assessment as performed by the wound care RN as outlined below:    Consultants:  None  Procedures:  None  Antimicrobials:  Anti-infectives (From admission, onward)    Start     Dose/Rate Route Frequency Ordered Stop   06/06/23 1000  rifaximin (XIFAXAN) tablet 550 mg        550 mg Oral 2 times daily 06/06/23 0518           Subjective: Seen and examined.  Still with pain 5-6 out of 10.  Which is his baseline.  No other complaint. Objective: Vitals:   06/09/23 1112 06/09/23 1404 06/09/23 2058 06/10/23 0557  BP: (!) 106/95 117/61 129/86 123/64  Pulse: 63 62 64 66  Resp:  20  18  Temp: 97.9 F (36.6 C) 98.5 F (36.9 C) 98.5 F (36.9 C) 98.8 F (37.1 C)  TempSrc: Oral Oral Oral Oral  SpO2: 100% 99% 100% 100%  Weight:      Height:        Intake/Output Summary (Last 24 hours) at 06/10/2023 1240 Last data  filed at 06/09/2023 1728 Gross per 24 hour  Intake 1919.93 ml  Output --  Net 1919.93 ml   Filed Weights   06/06/23 1749  Weight: 76.6 kg    Examination:  General exam: Appears calm and comfortable  Respiratory system: Clear to auscultation. Respiratory effort normal. Cardiovascular system: S1 & S2 heard, RRR. No JVD, murmurs, rubs, gallops or clicks. No pedal edema. Gastrointestinal system: Abdomen is nondistended, soft and epigastric tenderness, slightly improved compared to yesterday. No organomegaly or masses felt. Normal bowel sounds heard. Central nervous system: Alert and oriented. No focal neurological deficits. Extremities: Symmetric 5 x 5 power. Skin: No rashes, lesions or ulcers.  Psychiatry: Judgement and insight appear normal. Mood & affect appropriate.   Data Reviewed: I have personally reviewed following labs and imaging studies  CBC: Recent Labs  Lab 06/06/23 0026 06/07/23 0603 06/08/23 0612 06/08/23 1603 06/09/23 0533 06/09/23 1253 06/10/23 0806  WBC 4.0 3.3* 1.7*  --  1.4*  --  1.6*  NEUTROABS 3.3  --  1.1*  --  1.0*  --  1.1*  HGB 8.0* 7.5* 6.2* 7.4* 6.6* 7.7* 8.2*  HCT 23.4* 22.1* 18.9* 22.3* 20.5* 22.9* 24.5*  MCV 95.5 96.9 97.9  --  100.5*  --  95.7  PLT 49* 38* 33*  --  37*  --  42*   Basic Metabolic Panel: Recent Labs  Lab 06/06/23 1630 06/07/23 0603 06/08/23 0612 06/09/23 0533 06/10/23 0806  NA 127* 125* 127* 133* 134*  K 4.5 4.9 4.6 4.7 4.8  CL 105 102 106 114* 114*  CO2 16* 15* 16* 14* 16*  GLUCOSE 170* 178* 130* 152* 127*  BUN 42* 34* 27* 25* 24*  CREATININE 2.29* 1.99* 1.75* 1.69* 1.89*  CALCIUM 8.3* 8.3* 7.9* 8.2* 8.7*   GFR: Estimated Creatinine Clearance: 47.3 mL/min (A) (by C-G formula based on SCr of 1.89 mg/dL (H)). Liver Function Tests: Recent Labs  Lab 06/06/23 0026 06/07/23 0603 06/08/23 0612 06/09/23 0533  AST 35 32 31 29  ALT 20 17 15 14   ALKPHOS 118 64 72 66  BILITOT 3.5* 3.6* 2.3* 2.4*  PROT 7.4 6.7 5.6*  5.2*  ALBUMIN 3.3* 2.9* 2.5* 2.3*   Recent Labs  Lab 06/06/23 0026  LIPASE 4,907*   No results for input(s): "AMMONIA" in the last 168 hours. Coagulation Profile: Recent Labs  Lab 06/06/23 0035  INR 1.6*   Cardiac Enzymes: No results for input(s): "CKTOTAL", "CKMB", "CKMBINDEX", "TROPONINI" in the last 168 hours. BNP (last 3 results) No results for input(s): "PROBNP" in the last 8760 hours. HbA1C: No results for input(s): "HGBA1C" in the last 72 hours. CBG: Recent Labs  Lab 06/09/23 1142 06/09/23 1616 06/09/23 2100 06/10/23 0732 06/10/23 1137  GLUCAP 166* 73 97 114* 175*   Lipid Profile: No results for input(s): "CHOL", "HDL", "LDLCALC", "TRIG", "CHOLHDL", "LDLDIRECT" in the last 72 hours.  Thyroid Function Tests: No results for input(s): "TSH", "T4TOTAL", "FREET4", "T3FREE", "THYROIDAB" in the last 72 hours. Anemia Panel: No results for input(s): "VITAMINB12", "FOLATE", "FERRITIN", "TIBC", "IRON", "RETICCTPCT" in the last 72 hours. Sepsis Labs: No results for input(s): "PROCALCITON", "LATICACIDVEN" in the last 168 hours.  No results found for this or any previous visit (from the past 240 hours).   Radiology Studies: No results found.   Scheduled Meds:  feeding supplement  1 Container Oral TID BM   folic acid  1 mg Oral Daily   gabapentin  100 mg Oral BID   gabapentin  300 mg Oral QHS   insulin aspart  0-15 Units Subcutaneous TID WC   insulin aspart  0-5 Units Subcutaneous QHS   insulin detemir  10 Units Subcutaneous Daily   midodrine  5 mg Oral TID WC   nicotine  7 mg Transdermal Daily   pantoprazole  40 mg Oral BID   rifaximin  550 mg Oral BID   sertraline  50 mg Oral Daily   sucralfate  1 g Oral TID WC & HS   thiamine  100 mg Oral Daily   Continuous Infusions:     LOS: 4 days   Hughie Closs, MD [facility]  06/10/2023, 12:40 PM   *Please note that this is a verbal dictation therefore any spelling or grammatical errors are due to the  "Dragon Medical One" system interpretation.  Please page via Amion and do not message via secure chat for urgent patient care matters. Secure chat can be used for non urgent patient care matters.  How to contact the [facility] Attending or Consulting provider 7A - 7P or covering provider during after hours 7P -7A, for this patient?  Check the care team in [facility] and look for a) attending/consulting TRH provider listed and b) the [facility] team listed. Page or secure chat 7A-7P. Log into [email or web address]  and use [place]'s universal password to access. If you do not have the password, please contact the hospital operator. Locate the [facility] provider you are looking for under [facility] and page to a number that you can be directly reached. If you still have difficulty reaching the provider, please page the [facility] (Director on Call) for the Hospitalists listed on amion for assistance.

## 2023-06-10 NOTE — Plan of Care (Signed)
  Problem: Education: Goal: Knowledge of General Education information will improve Description: Including pain rating scale, medication(s)/side effects and non-pharmacologic comfort measures Outcome: Progressing   Problem: Health Behavior/Discharge Planning: Goal: Ability to manage health-related needs will improve Outcome: Progressing   Problem: Clinical Measurements: Goal: Ability to maintain clinical measurements within normal limits will improve Outcome: Progressing Goal: Will remain free from infection Outcome: Progressing Goal: Diagnostic test results will improve Outcome: Progressing Goal: Respiratory complications will improve Outcome: Progressing Goal: Cardiovascular complication will be avoided Outcome: Progressing   Problem: Activity: Goal: Risk for activity intolerance will decrease Outcome: Progressing   Problem: Nutrition: Goal: Adequate nutrition will be maintained Outcome: Progressing   Problem: Coping: Goal: Level of anxiety will decrease Outcome: Progressing   Problem: Elimination: Goal: Will not experience complications related to bowel motility Outcome: Progressing Goal: Will not experience complications related to urinary retention Outcome: Progressing   Problem: Pain Management: Goal: General experience of comfort will improve Outcome: Progressing   Problem: Safety: Goal: Ability to remain free from injury will improve Outcome: Progressing   Problem: Skin Integrity: Goal: Risk for impaired skin integrity will decrease Outcome: Progressing   Problem: Education: Goal: Ability to describe self-care measures that may prevent or decrease complications (Diabetes Survival Skills Education) will improve Outcome: Progressing Goal: Individualized Educational Video(s) Outcome: Progressing   Problem: Coping: Goal: Ability to adjust to condition or change in health will improve Outcome: Progressing   Problem: Fluid Volume: Goal: Ability to  maintain a balanced intake and output will improve Outcome: Progressing   Problem: Health Behavior/Discharge Planning: Goal: Ability to identify and utilize available resources and services will improve Outcome: Progressing Goal: Ability to manage health-related needs will improve Outcome: Progressing   Problem: Metabolic: Goal: Ability to maintain appropriate glucose levels will improve Outcome: Progressing   Problem: Nutritional: Goal: Maintenance of adequate nutrition will improve Outcome: Progressing   Problem: Nutritional: Goal: Maintenance of adequate nutrition will improve Outcome: Progressing   Problem: Tissue Perfusion: Goal: Adequacy of tissue perfusion will improve Outcome: Progressing

## 2023-06-11 ENCOUNTER — Inpatient Hospital Stay (HOSPITAL_COMMUNITY): Payer: Medicaid Other

## 2023-06-11 DIAGNOSIS — K852 Alcohol induced acute pancreatitis without necrosis or infection: Secondary | ICD-10-CM | POA: Diagnosis not present

## 2023-06-11 LAB — BASIC METABOLIC PANEL
Anion gap: 4 — ABNORMAL LOW (ref 5–15)
BUN: 24 mg/dL — ABNORMAL HIGH (ref 6–20)
CO2: 17 mmol/L — ABNORMAL LOW (ref 22–32)
Calcium: 8.9 mg/dL (ref 8.9–10.3)
Chloride: 112 mmol/L — ABNORMAL HIGH (ref 98–111)
Creatinine, Ser: 1.67 mg/dL — ABNORMAL HIGH (ref 0.61–1.24)
GFR, Estimated: 48 mL/min — ABNORMAL LOW (ref >60)
Glucose, Bld: 189 mg/dL — ABNORMAL HIGH (ref 70–99)
Potassium: 4.6 mmol/L (ref 3.5–5.1)
Sodium: 133 mmol/L — ABNORMAL LOW (ref 135–145)

## 2023-06-11 LAB — CBC WITH DIFFERENTIAL/PLATELET
Abs Immature Granulocytes: 0 10*3/uL (ref 0.00–0.07)
Basophils Absolute: 0 10*3/uL (ref 0.0–0.1)
Basophils Relative: 1 %
Eosinophils Absolute: 0.1 10*3/uL (ref 0.0–0.5)
Eosinophils Relative: 4 %
HCT: 23.8 % — ABNORMAL LOW (ref 39.0–52.0)
Hemoglobin: 7.8 g/dL — ABNORMAL LOW (ref 13.0–17.0)
Immature Granulocytes: 0 %
Lymphocytes Relative: 16 %
Lymphs Abs: 0.3 10*3/uL — ABNORMAL LOW (ref 0.7–4.0)
MCH: 32 pg (ref 26.0–34.0)
MCHC: 32.8 g/dL (ref 30.0–36.0)
MCV: 97.5 fL (ref 80.0–100.0)
Monocytes Absolute: 0.2 10*3/uL (ref 0.1–1.0)
Monocytes Relative: 13 %
Neutro Abs: 1.2 10*3/uL — ABNORMAL LOW (ref 1.7–7.7)
Neutrophils Relative %: 66 %
Platelets: 45 10*3/uL — ABNORMAL LOW (ref 150–400)
RBC: 2.44 MIL/uL — ABNORMAL LOW (ref 4.22–5.81)
RDW: 16.2 % — ABNORMAL HIGH (ref 11.5–15.5)
Smear Review: DECREASED
WBC: 1.8 10*3/uL — ABNORMAL LOW (ref 4.0–10.5)
nRBC: 0 % (ref 0.0–0.2)

## 2023-06-11 LAB — GLUCOSE, CAPILLARY
Glucose-Capillary: 160 mg/dL — ABNORMAL HIGH (ref 70–99)
Glucose-Capillary: 113 mg/dL — ABNORMAL HIGH (ref 70–99)
Glucose-Capillary: 164 mg/dL — ABNORMAL HIGH (ref 70–99)
Glucose-Capillary: 110 mg/dL — ABNORMAL HIGH (ref 70–99)

## 2023-06-11 MED ORDER — LIDOCAINE HCL 1 % IJ SOLN
INTRAMUSCULAR | Status: AC
  Filled 2023-06-11: qty 20

## 2023-06-11 NOTE — Progress Notes (Signed)
PROGRESS NOTE    Martin DONOHOE  WGN:[id number] DOB: October 02, 1965 DOA: 06/06/2023 PCP: Nelwyn Salisbury, MD   Brief Narrative:  HPI: Martin Anthony is a 57 y.o. male with medical history significant of decompensated alcoholic cirrhosis, GERD, hypertension, hyperlipidemia, colon cancer status post colectomy, type 2 diabetes who presents emergency department with severe epigastric pain.  Patient states symptoms been going on for last 4 days with poor p.o. intake.  Due to severe pain he presented to the ER where he was found to be afebrile hemodynamically stable.  Abs were obtained on presentation which demonstrated sodium 121, potassium 5.5, glucose 686, creatinine 2.9 at baseline, AST 35, ALT 20, lipase 4900, WBC 4.0, hemoglobin 8, urinalysis negative for infection, pH 7.33, INR 1.6.  Patient underwent CT abdomen pelvis which demonstrated acute pancreatitis with cirrhotic nephrology as well as gastritis.  Patient was started on IV fluids and pain control admitted for the management of acute pancreatitis.   Assessment & Plan:   Principal Problem:   Acute pancreatitis Active Problems:   Type 2 diabetes mellitus with neurological complications (HCC)   PAF (paroxysmal atrial fibrillation) (HCC)   Depression with anxiety   Elevated LFTs   Thrombocytopenia (HCC)  Acute pancreatitis, POA: Patient denies using alcohol since the end of September.  I am not sure if he is continuing to drink.  MRCP negative for choledocholithiasis.  Gallbladder wall thickening but this is nonspecific in the setting of cirrhosis with portal hypertension and ascites.  Triglycerides normal.  Gallstone ruled out.  He says his pain is slightly better than yesterday.  He knowing patient from recent hospitalization, his pain never gets better below 6.  Tolerating soft diet.  Will continue that.  I think he should be ready for discharge tomorrow so we Did discuss the discharge plan.  History of type 2 diabetes mellitus with  hyperglycemia: Interestingly, his hemoglobin A1c has remained either normal or in prediabetes range for last 4 years with recent 6.2 just 2 weeks ago when he was hospitalized but he presented with significant hyperglycemia and blood sugar of 686 upon arrival.  Of note, during recent hospitalization as well, upon presentation, his blood sugar was in 500 range and he was managed with only SSI while inpatient.  He was started on 15 units of Semglee.  Patient became hypoglycemic, Semglee reduced to 10 units and blood sugars controlled currently.  Continue SSI.  Acute blood loss anemia on anemia of chronic disease/gastritis/history of duodenitis/pancytopenia: Likely secondary to chronic alcoholism and recurrent pancreatitis.  Hemoglobin dropped to 6.2 required 1 unit of PRBC transfusion on 06/08/2023, hemoglobin improved but dropped again to 6.6 requiring second unit of transfusion.  Hemoglobin >8 yesterday but 7.8 today.  Platelets as well as WBC improving. continue twice daily PPI.  Monitor CBC daily.  Of note, he was recently seen by GI during recent hospitalization for similar complaints and GI recommended conservative management.  Reassess tomorrow.  History of alcoholic liver cirrhosis with ascites: Does appear to have some ascites.  IR consulted and therapeutic paracentesis was completed with yield of 2 L.  Alcohol induced pancytopenia: Stable.  Monitor closely.  Paroxysmal atrial fibrillation: Not a candidate for anticoagulation due to GI bleed.  Hyperkalemia: Resolved.  Acute on chronic hyponatremia: Patient's sodium at baseline remains between 126-132.  Currently at within baseline.  AKI on CKD stage IIIb: Baseline creatinine around 1.9 however creatinine rose to 4.83 during recent hospitalization and at the time of discharge, it was 3.2.  Fortunately, his creatinine is better than that and 2.94 upon arrival and further improved to 1. 89 today. Will monitor daily.  Avoid nephrotoxic  agents.  History of sinus bradycardia: We discontinued his beta-blocker during recent hospitalization.  Liver lesion/mass: Indeterminate and equivocal 1.2 cm rim enhancing lesion within segment 3 of the liver. Recommend follow-up dedicated multiphase liver MRI in 3-6 months to ensure stability.  Nephrolithiasis: CT renal study showed 5 mm nonobstructing right renal calculus.  Patient is asymptomatic.  DVT prophylaxis: Place and maintain sequential compression device Start: 06/06/23 1027 SCDs Start: 06/06/23 0517   Code Status: Full Code  Family Communication:  None present at bedside.  Plan of care discussed with patient in length and he/she verbalized understanding and agreed with it.  Status is: Inpatient Remains inpatient appropriate because: Pancreatitis/anemia, observe overnight, if numbers improve and pain improved, may discharge tomorrow.  Estimated body mass index is 20.56 kg/m as calculated from the following:   Height as of this encounter: 6\' 4"  (1.93 m).   Weight as of this encounter: 76.6 kg.    Nutritional Assessment: Body mass index is 20.56 kg/m.Marland Kitchen Seen by dietician.  I agree with the assessment and plan as outlined below: Nutrition Status:        . Skin Assessment: I have examined the patient's skin and I agree with the wound assessment as performed by the wound care RN as outlined below:    Consultants:  None  Procedures:  None  Antimicrobials:  Anti-infectives (From admission, onward)    Start     Dose/Rate Route Frequency Ordered Stop   06/06/23 1000  rifaximin (XIFAXAN) tablet 550 mg        550 mg Oral 2 times daily 06/06/23 0518           Subjective: Patient seen and examined.  Still complains of abdominal pain but slightly better than yesterday.  He believes that his pain is mostly pressure from ascites and is requesting paracentesis which she typically requires every month and last time it was done was on 05/12/2023.  Objective: Vitals:    06/11/23 1100 06/11/23 1142 06/11/23 1149 06/11/23 1313  BP: 116/60 113/63 (!) 114/59 131/62  Pulse:    (!) 58  Resp:    19  Temp:    99 F (37.2 C)  TempSrc:    Oral  SpO2:    100%  Weight:      Height:        Intake/Output Summary (Last 24 hours) at 06/11/2023 1319 Last data filed at 06/11/2023 0800 Gross per 24 hour  Intake 480 ml  Output --  Net 480 ml   Filed Weights   06/06/23 1749  Weight: 76.6 kg    Examination:  General exam: Appears calm and comfortable  Respiratory system: Clear to auscultation. Respiratory effort normal. Cardiovascular system: S1 & S2 heard, RRR. No JVD, murmurs, rubs, gallops or clicks. No pedal edema. Gastrointestinal system: Abdomen is nondistended, soft and epigastric tenderness. No organomegaly or masses felt. Normal bowel sounds heard.  Positive fluid shift consistent with ascites. Central nervous system: Alert and oriented. No focal neurological deficits. Extremities: Symmetric 5 x 5 power. Skin: No rashes, lesions or ulcers.  Psychiatry: Judgement and insight appear normal. Mood & affect appropriate.    Data Reviewed: I have personally reviewed following labs and imaging studies  CBC: Recent Labs  Lab 06/06/23 0026 06/07/23 0603 06/08/23 0612 06/08/23 1603 06/09/23 0533 06/09/23 1253 06/10/23 0806 06/11/23 0850  WBC 4.0 3.3*  1.7*  --  1.4*  --  1.6* 1.8*  NEUTROABS 3.3  --  1.1*  --  1.0*  --  1.1* 1.2*  HGB 8.0* 7.5* 6.2* 7.4* 6.6* 7.7* 8.2* 7.8*  HCT 23.4* 22.1* 18.9* 22.3* 20.5* 22.9* 24.5* 23.8*  MCV 95.5 96.9 97.9  --  100.5*  --  95.7 97.5  PLT 49* 38* 33*  --  37*  --  42* 45*   Basic Metabolic Panel: Recent Labs  Lab 06/07/23 0603 06/08/23 0612 06/09/23 0533 06/10/23 0806 06/11/23 0850  NA 125* 127* 133* 134* 133*  K 4.9 4.6 4.7 4.8 4.6  CL 102 106 114* 114* 112*  CO2 15* 16* 14* 16* 17*  GLUCOSE 178* 130* 152* 127* 189*  BUN 34* 27* 25* 24* 24*  CREATININE 1.99* 1.75* 1.69* 1.89* 1.67*  CALCIUM  8.3* 7.9* 8.2* 8.7* 8.9   GFR: Estimated Creatinine Clearance: 53.5 mL/min (A) (by C-G formula based on SCr of 1.67 mg/dL (H)). Liver Function Tests: Recent Labs  Lab 06/06/23 0026 06/07/23 0603 06/08/23 0612 06/09/23 0533  AST 35 32 31 29  ALT 20 17 15 14   ALKPHOS 118 64 72 66  BILITOT 3.5* 3.6* 2.3* 2.4*  PROT 7.4 6.7 5.6* 5.2*  ALBUMIN 3.3* 2.9* 2.5* 2.3*   Recent Labs  Lab 06/06/23 0026  LIPASE 4,907*   No results for input(s): "AMMONIA" in the last 168 hours. Coagulation Profile: Recent Labs  Lab 06/06/23 0035  INR 1.6*   Cardiac Enzymes: No results for input(s): "CKTOTAL", "CKMB", "CKMBINDEX", "TROPONINI" in the last 168 hours. BNP (last 3 results) No results for input(s): "PROBNP" in the last 8760 hours. HbA1C: No results for input(s): "HGBA1C" in the last 72 hours. CBG: Recent Labs  Lab 06/10/23 1137 06/10/23 1648 06/10/23 2137 06/11/23 0734 06/11/23 1112  GLUCAP 175* 169* 121* 160* 113*   Lipid Profile: No results for input(s): "CHOL", "HDL", "LDLCALC", "TRIG", "CHOLHDL", "LDLDIRECT" in the last 72 hours.  Thyroid Function Tests: No results for input(s): "TSH", "T4TOTAL", "FREET4", "T3FREE", "THYROIDAB" in the last 72 hours. Anemia Panel: No results for input(s): "VITAMINB12", "FOLATE", "FERRITIN", "TIBC", "IRON", "RETICCTPCT" in the last 72 hours. Sepsis Labs: No results for input(s): "PROCALCITON", "LATICACIDVEN" in the last 168 hours.  No results found for this or any previous visit (from the past 240 hours).   Radiology Studies: US Paracentesis Result Date: 06/11/2023 INDICATION: Patient with history of acute pancreatitis, decompensated alcoholic cirrhosis, colon cancer with prior colectomy, recurrent ascites. Request received for therapeutic paracentesis. EXAM: ULTRASOUND GUIDED THERAPEUTIC PARACENTESIS MEDICATIONS: 8 mL of 1% lidocaine COMPLICATIONS: None immediate. PROCEDURE: Informed written consent was obtained from the patient after a  discussion of the risks, benefits and alternatives to treatment. A timeout was performed prior to the initiation of the procedure. Initial ultrasound scanning demonstrates a small to moderate amount of ascites within the left lower abdominal quadrant. The left lower abdomen was prepped and draped in the usual sterile fashion. 1% lidocaine was used for local anesthesia. Following this, a 19 gauge, 7-cm, Yueh catheter was introduced. An ultrasound image was saved for documentation purposes. The paracentesis was performed. The catheter was removed and a dressing was applied. The patient tolerated the procedure well without immediate post procedural complication. FINDINGS: A total of approximately 2 liters of hazy, amber fluid was removed. IMPRESSION: Successful ultrasound-guided therapeutic paracentesis yielding 2 liters of peritoneal fluid. PLAN: The patient has required >/=2 paracenteses in a 30 day period and a formal evaluation by the [facility] Interventional  Radiology Portal Hypertension [facility] has been arranged. Performed by: Artemio Aly Electronically Signed   By: Acquanetta Belling M.D.   On: 06/11/2023 12:58     Scheduled Meds:  feeding supplement  1 Container Oral TID BM   folic acid  1 mg Oral Daily   gabapentin  100 mg Oral BID   gabapentin  300 mg Oral QHS   insulin aspart  0-15 Units Subcutaneous TID WC   insulin aspart  0-5 Units Subcutaneous QHS   insulin detemir  10 Units Subcutaneous Daily   midodrine  5 mg Oral TID WC   nicotine  7 mg Transdermal Daily   pantoprazole  40 mg Oral BID   rifaximin  550 mg Oral BID   sertraline  50 mg Oral Daily   sucralfate  1 g Oral TID WC & HS   thiamine  100 mg Oral Daily   Continuous Infusions:     LOS: 5 days   Hughie Closs, MD [facility]  06/11/2023, 1:19 PM   *Please note that this is a verbal dictation therefore any spelling or grammatical errors are due to the "[employer] One" system interpretation.  Please page via  Amion and do not message via secure chat for urgent patient care matters. Secure chat can be used for non urgent patient care matters.  How to contact the [facility] Attending or Consulting provider 7A - 7P or covering provider during after hours 7P -7A, for this patient?  Check the care team in [facility] and look for a) attending/consulting [facility] provider listed and b) the [facility] team listed. Page or secure chat 7A-7P. Log into [email or web address] and use [facility] universal password to access. If you do not have the password, please contact the hospital operator. Locate the [facility] provider you are looking for under [facility] and page to a number that you can be directly reached. If you still have difficulty reaching the provider, please page the [facility] (Director on Call) for the Hospitalists listed on amion for assistance.

## 2023-06-11 NOTE — Procedures (Signed)
PROCEDURE SUMMARY:  Successful ultrasound guided paracentesis from the left lower  quadrant.  Yielded 2 liters of hazy,amber fluid.  No immediate complications.  The patient tolerated the procedure well.   Specimen  was not sent for labs.  EBL < 2mL  The patient has required >/=2 paracenteses in a 30 day period and a screening evaluation by the [facility] Interventional Radiology Portal Hypertension Clinic has been arranged.   Artemio Aly

## 2023-06-12 DIAGNOSIS — E1149 Type 2 diabetes mellitus with other diabetic neurological complication: Secondary | ICD-10-CM | POA: Diagnosis not present

## 2023-06-12 DIAGNOSIS — I480 Paroxysmal atrial fibrillation: Secondary | ICD-10-CM

## 2023-06-12 DIAGNOSIS — F418 Other specified anxiety disorders: Secondary | ICD-10-CM

## 2023-06-12 DIAGNOSIS — K852 Alcohol induced acute pancreatitis without necrosis or infection: Secondary | ICD-10-CM | POA: Diagnosis not present

## 2023-06-12 LAB — GLUCOSE, CAPILLARY: Glucose-Capillary: 143 mg/dL — ABNORMAL HIGH (ref 70–99)

## 2023-06-12 MED ORDER — INSULIN GLARGINE 100 UNIT/ML SOLOSTAR PEN: 10.0000 [IU] | PEN_INJECTOR | Freq: Every day | SUBCUTANEOUS | 2 refills | Status: AC

## 2023-06-12 MED ORDER — OXYCODONE HCL 5 MG PO TABS: 5.0000 mg | ORAL_TABLET | Freq: Four times a day (QID) | ORAL | 0 refills | Status: DC | PRN

## 2023-06-12 MED ORDER — SUCRALFATE 1 G PO TABS: 1.0000 g | ORAL_TABLET | Freq: Three times a day (TID) | ORAL | Status: DC

## 2023-06-12 MED ORDER — METFORMIN HCL ER (OSM) 500 MG PO TB24: 500.0000 mg | ORAL_TABLET | Freq: Every day | ORAL | 2 refills | Status: DC

## 2023-06-12 MED ORDER — ONDANSETRON HCL 4 MG PO TABS: 4.0000 mg | ORAL_TABLET | Freq: Four times a day (QID) | ORAL | 0 refills | Status: AC | PRN

## 2023-06-12 MED ORDER — MIDODRINE HCL 5 MG PO TABS: 5.0000 mg | ORAL_TABLET | Freq: Three times a day (TID) | ORAL | Status: DC

## 2023-06-12 MED ORDER — INSULIN PEN NEEDLE 32G X 8 MM MISC
0 refills | Status: AC
Start: 2023-06-12 — End: ?

## 2023-06-12 NOTE — Progress Notes (Signed)
AVS reviewed with PT no inquires made. PT says he will uber home. PT has no complaints of chest pain, SOB or discomfort.

## 2023-06-12 NOTE — Discharge Summary (Signed)
Physician Discharge Summary  Martin Anthony:[id number] DOB: [birth year] DOA: 06/06/2023  PCP: Nelwyn Salisbury, MD  Admit date: 06/06/2023 Discharge date: 06/12/2023  Admitted From: Home  Discharge disposition: Home   Recommendations for Outpatient Follow-Up:   Follow up with your primary care provider in one week.  Check CBC, BMP, magnesium in the next visit  Patient does have a liver lesion which needs follow-up.  Recommend follow-up dedicated multiphase liver MRI in 3-6 months to ensure stability  Discharge Diagnosis:   Principal Problem:   Acute pancreatitis Active Problems:   Type 2 diabetes mellitus with neurological complications (HCC)   PAF (paroxysmal atrial fibrillation) (HCC)   Depression with anxiety   Elevated LFTs   Thrombocytopenia (HCC)    Discharge Condition: Improved.  Diet recommendation: Low-fat diet.  Wound care: None.  Code status: Full.   History of Present Illness:   Martin Anthony is a 57 y.o. male with medical history significant of decompensated alcoholic cirrhosis, GERD, hypertension, hyperlipidemia, colon cancer status post colectomy, type 2 diabetes who presents emergency department with severe epigastric pain.  Patient states symptoms been going on for last 4 days with poor p.o. intake.  Due to severe pain he presented to the ER where he was found to be afebrile hemodynamically stable.  Labs were obtained on presentation which demonstrated sodium 121, potassium 5.5, glucose 686, creatinine 2.9 at baseline, AST 35, ALT 20, lipase 4900, WBC 4.0, hemoglobin 8, urinalysis negative for infection, pH 7.33, INR 1.6.  Patient underwent CT abdomen pelvis which demonstrated acute pancreatitis with cirrhotic nephrology as well as gastritis.  Patient was started on IV fluids and pain control admitted for the management of acute pancreatitis.   Hospital Course:   Following conditions were addressed during hospitalization as listed below,  Acute  pancreatitis:  No recent alcohol use use.  MRCP negative for choledocholithiasis.  Gallbladder wall thickening but nonspecific in the context of ascites and portal hypertension.  No gallstones.  At this time patient is tolerating diet.  Advised him to go slow with liquids and fat-free diet.   History of type 2 diabetes mellitus with hyperglycemia:  \Interestingly, his hemoglobin A1c has remained either normal or in prediabetes range for last 4 years with recent 6.2 just 2 weeks ago when he was hospitalized but he presented with significant hyperglycemia and blood sugar of 686 upon arrival.  Has been started on long-acting insulin.  Will give 10 units of Lantus pen on discharge with metformin 500 milligram long-acting daily.  I have advised him to check his blood glucose levels closely and follow-up with his primary care physician for this for better at this point.  acute blood loss anemia on anemia of chronic disease/gastritis/history of duodenitis/pancytopenia: Likely secondary to chronic alcoholism and recurrent pancreatitis.  Hemoglobin dropped to 6.2 required 1 unit of PRBC transfusion on 06/08/2023, continue PPI twice daily.  Hemoglobin prior to discharge was 7.8.  Of note, he was recently seen by GI during recent hospitalization for similar complaints and GI recommended conservative management.  Denies any overt GI bleed.   History of alcoholic liver cirrhosis with ascites: Feels better after paracentesis.  IR consulted and therapeutic paracentesis was completed with yield of 2 L.   Alcohol induced pancytopenia: Stable.  Monitor closely.  Check CBC as outpatient.   Paroxysmal atrial fibrillation: Not a candidate for anticoagulation due to GI bleed.  Rate controlled.   Hyperkalemia: Resolved.  Latest potassium prior to discharge was 4.6.  Acute on chronic hyponatremia: Patient's sodium at baseline remains between 126-132.  Currently at within baseline.  Latest sodium of 133.   AKI on CKD  stage IIIb:  Baseline creatinine around 1.9 however creatinine rose to 4.83 during recent hospitalization  Currently at 1.6.  Patient takes spironolactone at home.  Does not take Lasix.    History of sinus bradycardia: We discontinued his beta-blocker during recent hospitalization.   Liver lesion/mass: Indeterminate and equivocal 1.2 cm rim enhancing lesion within segment 3 of the liver. Recommend follow-up dedicated multiphase liver MRI in 3-6 months to ensure stability.   Nephrolithiasis: CT renal study showed 5 mm nonobstructing right renal calculus.  Patient is asymptomatic.  Disposition.  At this time, patient is stable for disposition home with outpatient PCP follow-up.  Medical Consultants:   None.  Procedures:    Paracentesis Subjective:   Today, patient was seen and examined at bedside.  Feels better has been tolerating oral diet wants to go home.  Discharge Exam:   Vitals:   06/11/23 2141 06/12/23 0549  BP: (!) 129/54 (!) 151/74  Pulse: (!) 59 77  Resp: 16 17  Temp: 98.7 F (37.1 C) 98.6 F (37 C)  SpO2: 100% 100%   Vitals:   06/11/23 1149 06/11/23 1313 06/11/23 2141 06/12/23 0549  BP: (!) 114/59 131/62 (!) 129/54 (!) 151/74  Pulse:  (!) 58 (!) 59 77  Resp:  19 16 17   Temp:  99 F (37.2 C) 98.7 F (37.1 C) 98.6 F (37 C)  TempSrc:  Oral Oral Oral  SpO2:  100% 100% 100%  Weight:      Height:       Body mass index is 20.56 kg/m.   General: Alert awake, not in obvious distress HENT: pupils equally reacting to light,  No scleral pallor or icterus noted. Oral mucosa is moist.  Chest:  Clear breath sounds.  No crackles or wheezes.  CVS: S1 &S2 heard. No murmur.  Regular rate and rhythm. Abdomen: Soft, mild tenderness over the epigastric region.  Nondistended.  Bowel sounds are heard.  Colostomy bag in place. Extremities: No cyanosis, clubbing or edema.  Peripheral pulses are palpable. Psych: Alert, awake and oriented, normal mood CNS:  No cranial nerve  deficits.  Power equal in all extremities.   Skin: Warm and dry.  No rashes noted.  The results of significant diagnostics from this hospitalization (including imaging, microbiology, ancillary and laboratory) are listed below for reference.     Diagnostic Studies:   MR ABDOMEN MRCP W WO CONTAST Result Date: 06/06/2023 CLINICAL DATA:  Jaundice and hyperbilirubinemia. EXAM: MRI ABDOMEN WITHOUT AND WITH CONTRAST (INCLUDING MRCP) TECHNIQUE: Multiplanar multisequence MR imaging of the abdomen was performed both before and after the administration of intravenous contrast. Heavily T2-weighted images of the biliary and pancreatic ducts were obtained, and three-dimensional MRCP images were rendered by post processing. CONTRAST:  7mL GADAVIST GADOBUTROL 1 MMOL/ML IV SOLN COMPARISON:  CT 06/06/2023. FINDINGS: Lower chest: No acute findings. Hepatobiliary: Cirrhotic morphology of the liver which has a diffusely nodular contour and hypertrophy of the lateral segment and caudate lobes of liver. Heterogeneous T2 and T1 signal abnormality is noted throughout the liver compatible with cirrhosis. There is no true arterial phase images. On the early sequences there is a, within segment 3 of the liver, there is a focal rim enhancing lesion with central T1 hypointensity measuring 1.2 cm. This becomes isointense to the liver on the delayed images. No corresponding signal abnormality on the  diffusion-weighted sequences. No additional enhancing liver lesions identified. The gallbladder wall appears thickened which measures 4 to 5 mm, image 20/4. No gallstones identified. No bile duct dilatation. Pancreas: The pancreas appears edematous with diffuse peripancreatic stranding/edema. Edema extends into the small bowel mesentery and into the bilateral retroperitoneal spaces. No main duct dilatation or focal mass lesion identified. No pseudo cyst or signs of pancreatic necrosis. Spleen: Splenomegaly as before. Spleen measures 17 cm in  cranial caudal dimension. Adrenals/Urinary Tract: No suspicious kidney mass or signs of obstructive uropathy. Normal adrenal glands. Stomach/Bowel: Normal appearance of the stomach. Right paramidline ileostomy is identified. No dilated bowel loops identified. Hartmann's pouch terminates in the right upper quadrant of the abdomen. Vascular/Lymphatic: Normal caliber abdominal aorta. Portal vein, splenic vein and SMV all remain patent. Upper abdominal varicosities identified. Esophageal varices. No abdominal adenopathy. Other:  Perihepatic ascites as noted previously. Musculoskeletal: No suspicious bone lesions identified. IMPRESSION: 1. Cirrhotic morphology of the liver with signs of portal hypertension including splenomegaly, upper abdominal varicosities and perihepatic ascites. 2. Indeterminate and equivocal 1.2 cm rim enhancing lesion within segment 3 of the liver. Recommend follow-up dedicated multiphase liver MRI in 3-6 months to ensure stability. 3. The pancreas appears edematous with diffuse peripancreatic stranding/edema. Edema extends into the small bowel mesentery and into the bilateral retroperitoneal spaces. Findings are compatible with acute pancreatitis. No main duct dilatation or focal mass lesion identified. No pseudo cyst or signs of pancreatic necrosis. 4. Gallbladder wall appears thickened which measures 4 to 5 mm. This is a nonspecific finding in the setting of cirrhosis with portal venous hypertension and ascites. 5. Right paramidline ileostomy is identified. No dilated bowel loops identified. Electronically Signed   By: Signa Kell M.D.   On: 06/06/2023 07:58   MR 3D Recon At Scanner Result Date: 06/06/2023 CLINICAL DATA:  Jaundice and hyperbilirubinemia. EXAM: MRI ABDOMEN WITHOUT AND WITH CONTRAST (INCLUDING MRCP) TECHNIQUE: Multiplanar multisequence MR imaging of the abdomen was performed both before and after the administration of intravenous contrast. Heavily T2-weighted images of the  biliary and pancreatic ducts were obtained, and three-dimensional MRCP images were rendered by post processing. CONTRAST:  7mL GADAVIST GADOBUTROL 1 MMOL/ML IV SOLN COMPARISON:  CT 06/06/2023. FINDINGS: Lower chest: No acute findings. Hepatobiliary: Cirrhotic morphology of the liver which has a diffusely nodular contour and hypertrophy of the lateral segment and caudate lobes of liver. Heterogeneous T2 and T1 signal abnormality is noted throughout the liver compatible with cirrhosis. There is no true arterial phase images. On the early sequences there is a, within segment 3 of the liver, there is a focal rim enhancing lesion with central T1 hypointensity measuring 1.2 cm. This becomes isointense to the liver on the delayed images. No corresponding signal abnormality on the diffusion-weighted sequences. No additional enhancing liver lesions identified. The gallbladder wall appears thickened which measures 4 to 5 mm, image 20/4. No gallstones identified. No bile duct dilatation. Pancreas: The pancreas appears edematous with diffuse peripancreatic stranding/edema. Edema extends into the small bowel mesentery and into the bilateral retroperitoneal spaces. No main duct dilatation or focal mass lesion identified. No pseudo cyst or signs of pancreatic necrosis. Spleen: Splenomegaly as before. Spleen measures 17 cm in cranial caudal dimension. Adrenals/Urinary Tract: No suspicious kidney mass or signs of obstructive uropathy. Normal adrenal glands. Stomach/Bowel: Normal appearance of the stomach. Right paramidline ileostomy is identified. No dilated bowel loops identified. Hartmann's pouch terminates in the right upper quadrant of the abdomen. Vascular/Lymphatic: Normal caliber abdominal aorta. Portal vein, splenic  vein and SMV all remain patent. Upper abdominal varicosities identified. Esophageal varices. No abdominal adenopathy. Other:  Perihepatic ascites as noted previously. Musculoskeletal: No suspicious bone lesions  identified. IMPRESSION: 1. Cirrhotic morphology of the liver with signs of portal hypertension including splenomegaly, upper abdominal varicosities and perihepatic ascites. 2. Indeterminate and equivocal 1.2 cm rim enhancing lesion within segment 3 of the liver. Recommend follow-up dedicated multiphase liver MRI in 3-6 months to ensure stability. 3. The pancreas appears edematous with diffuse peripancreatic stranding/edema. Edema extends into the small bowel mesentery and into the bilateral retroperitoneal spaces. Findings are compatible with acute pancreatitis. No main duct dilatation or focal mass lesion identified. No pseudo cyst or signs of pancreatic necrosis. 4. Gallbladder wall appears thickened which measures 4 to 5 mm. This is a nonspecific finding in the setting of cirrhosis with portal venous hypertension and ascites. 5. Right paramidline ileostomy is identified. No dilated bowel loops identified. Electronically Signed   By: Signa Kell M.D.   On: 06/06/2023 07:58   CT Renal Stone Study Result Date: 06/06/2023 CLINICAL DATA:  Right upper quadrant pain with intermittent bleeding around and existing ostomy site. EXAM: CT ABDOMEN AND PELVIS WITHOUT CONTRAST TECHNIQUE: Multidetector CT imaging of the abdomen and pelvis was performed following the standard protocol without IV contrast. RADIATION DOSE REDUCTION: This exam was performed according to the departmental dose-optimization program which includes automated exposure control, adjustment of the mA and/or kV according to patient size and/or use of iterative reconstruction technique. COMPARISON:  May 20, 2023 FINDINGS: Lower chest: No acute abnormality. Hepatobiliary: The liver is cirrhotic in appearance. No focal liver abnormality is seen. No gallstones, gallbladder wall thickening, or biliary dilatation. Pancreas: Mild peripancreatic inflammatory fat stranding is seen. This represents a new finding when compared to the prior study. There is no  evidence of pancreatic ductal dilatation Spleen: There is moderate to marked severity splenomegaly. Adrenals/Urinary Tract: Adrenal glands are unremarkable. Kidneys are normal in size, without obstructing renal calculi, focal lesion, or hydronephrosis. A 5 mm nonobstructing renal calculus is seen within the lower pole of the right kidney. The 3 mm nonobstructing renal calculus seen within the left kidney on the prior study is no longer visualized. Bladder is unremarkable. Stomach/Bowel: The stomach is poorly distended. Moderate to marked severity diffuse gastric wall thickening is noted. Postoperative changes consistent with prior right hemicolectomy are seen with a subsequent right-sided paramidline ileostomy site. No evidence of bowel wall thickening, distention, or inflammatory changes. Vascular/Lymphatic: Aortic atherosclerosis. No enlarged abdominal or pelvic lymph nodes. Reproductive: Prostate is unremarkable. Other: A 2.8 cm x 4.6 cm fluid-filled right inguinal hernia is seen. This is increased in size when compared to the prior study. There is a mild amount of perihepatic free fluid. A small amount of posterior pelvic free fluid is also noted. Musculoskeletal: No acute or significant osseous findings. IMPRESSION: 1. Findings consistent with acute pancreatitis. Correlation with pancreatic enzymes is recommended. 2. Cirrhosis with moderate to marked severity splenomegaly. 3. Moderate to marked severity diffuse gastric wall thickening which may represent gastritis. 4. Postoperative changes consistent with prior right hemicolectomy with a subsequent right-sided paramidline ileostomy site. 5. 2.8 cm x 4.6 cm fluid-filled right inguinal hernia. 6. Mild amount of perihepatic and posterior pelvic free fluid. 7. 5 mm nonobstructing right renal calculus. 8. Aortic atherosclerosis. Aortic Atherosclerosis (ICD10-I70.0). Electronically Signed   By: Aram Candela M.D.   On: 06/06/2023 03:34     Labs:   Basic  Metabolic Panel: Recent Labs  Lab 06/07/23  4742 06/08/23 0612 06/09/23 0533 06/10/23 0806 06/11/23 0850  NA 125* 127* 133* 134* 133*  K 4.9 4.6 4.7 4.8 4.6  CL 102 106 114* 114* 112*  CO2 15* 16* 14* 16* 17*  GLUCOSE 178* 130* 152* 127* 189*  BUN 34* 27* 25* 24* 24*  CREATININE 1.99* 1.75* 1.69* 1.89* 1.67*  CALCIUM 8.3* 7.9* 8.2* 8.7* 8.9   GFR Estimated Creatinine Clearance: 53.5 mL/min (A) (by C-G formula based on SCr of 1.67 mg/dL (H)). Liver Function Tests: Recent Labs  Lab 06/06/23 0026 06/07/23 0603 06/08/23 0612 06/09/23 0533  AST 35 32 31 29  ALT 20 17 15 14   ALKPHOS 118 64 72 66  BILITOT 3.5* 3.6* 2.3* 2.4*  PROT 7.4 6.7 5.6* 5.2*  ALBUMIN 3.3* 2.9* 2.5* 2.3*   Recent Labs  Lab 06/06/23 0026  LIPASE 4,907*   No results for input(s): "AMMONIA" in the last 168 hours. Coagulation profile Recent Labs  Lab 06/06/23 0035  INR 1.6*    CBC: Recent Labs  Lab 06/06/23 0026 06/07/23 0603 06/08/23 0612 06/08/23 1603 06/09/23 0533 06/09/23 1253 06/10/23 0806 06/11/23 0850  WBC 4.0 3.3* 1.7*  --  1.4*  --  1.6* 1.8*  NEUTROABS 3.3  --  1.1*  --  1.0*  --  1.1* 1.2*  HGB 8.0* 7.5* 6.2* 7.4* 6.6* 7.7* 8.2* 7.8*  HCT 23.4* 22.1* 18.9* 22.3* 20.5* 22.9* 24.5* 23.8*  MCV 95.5 96.9 97.9  --  100.5*  --  95.7 97.5  PLT 49* 38* 33*  --  37*  --  42* 45*   Cardiac Enzymes: No results for input(s): "CKTOTAL", "CKMB", "CKMBINDEX", "TROPONINI" in the last 168 hours. BNP: Invalid input(s): "POCBNP" CBG: Recent Labs  Lab 06/11/23 0734 06/11/23 1112 06/11/23 1612 06/11/23 2144 06/12/23 0720  GLUCAP 160* 113* 164* 110* 143*   D-Dimer No results for input(s): "DDIMER" in the last 72 hours. Hgb A1c No results for input(s): "HGBA1C" in the last 72 hours. Lipid Profile No results for input(s): "CHOL", "HDL", "LDLCALC", "TRIG", "CHOLHDL", "LDLDIRECT" in the last 72 hours. Thyroid function studies No results for input(s): "TSH", "T4TOTAL", "T3FREE",  "THYROIDAB" in the last 72 hours.  Invalid input(s): "FREET3" Anemia work up No results for input(s): "VITAMINB12", "FOLATE", "FERRITIN", "TIBC", "IRON", "RETICCTPCT" in the last 72 hours. Microbiology No results found for this or any previous visit (from the past 240 hours).   Discharge Instructions:   Discharge Instructions     Diet - low sodium heart healthy   Complete by: As directed    Discharge instructions   Complete by: As directed    Advance diet as tolerated.  Avoid fatty, fried foods. Follow up with your primary care provider in one week. Check blood work at that time. Please check sugar at home. Take insulin as prescribed. Seek medical attention for worsening symptoms.   Increase activity slowly   Complete by: As directed       Allergies as of 06/12/2023       Reactions   Acetaminophen Other (See Comments)   Hx of advanced cirrhosis/liver disease   Ibuprofen Other (See Comments)   Hx of advanced cirrhosis/liver disease        Medication List     STOP taking these medications    furosemide 20 MG tablet Commonly known as: LASIX       TAKE these medications    carvedilol 3.125 MG tablet Commonly known as: COREG Take 3.125 mg by mouth 2 (two) times daily with  a meal.   folic acid 1 MG tablet Commonly known as: FOLVITE Take 1 tablet (1 mg total) by mouth daily.   gabapentin 300 MG capsule Commonly known as: NEURONTIN Take 300 mg by mouth at bedtime. What changed: Another medication with the same name was changed. Make sure you understand how and when to take each.   gabapentin 100 MG capsule Commonly known as: NEURONTIN Take 1 capsule (100 mg total) by mouth 3 (three) times daily. What changed: when to take this   insulin glargine 100 UNIT/ML Solostar Pen Commonly known as: LANTUS Inject 10 Units into the skin at bedtime.   Insulin Pen Needle 32G X 8 MM Misc Use as directed   Iron (Ferrous Sulfate) 325 (65 Fe) MG Tabs Take 325 mg by  mouth daily.   ketoconazole 2 % cream Commonly known as: NIZORAL Apply 1 Application topically 2 (two) times daily as needed for irritation.   metformin 500 MG (OSM) 24 hr tablet Commonly known as: FORTAMET Take 1 tablet (500 mg total) by mouth daily with breakfast.   midodrine 5 MG tablet Commonly known as: PROAMATINE Take 1 tablet (5 mg total) by mouth 3 (three) times daily with meals.   nicotine 14 mg/24hr patch Commonly known as: NICODERM CQ - dosed in mg/24 hours Place 1 patch (14 mg total) onto the skin daily.   nystatin powder Commonly known as: MYCOSTATIN/NYSTOP Apply 1 Application topically 3 (three) times daily. What changed:  when to take this reasons to take this   ondansetron 4 MG tablet Commonly known as: ZOFRAN Take 1 tablet (4 mg total) by mouth every 6 (six) hours as needed for nausea.   oxyCODONE 5 MG immediate release tablet Commonly known as: Oxy IR/ROXICODONE Take 1 tablet (5 mg total) by mouth every 6 (six) hours as needed for moderate pain (pain score 4-6) or severe pain (pain score 7-10).   pantoprazole 40 MG tablet Commonly known as: Protonix Take 1 tablet (40 mg total) by mouth daily.   rifaximin 550 MG Tabs tablet Commonly known as: XIFAXAN Take 1 tablet (550 mg total) by mouth 2 (two) times daily.   sertraline 50 MG tablet Commonly known as: ZOLOFT Take 1 tablet (50 mg total) by mouth daily.   spironolactone 100 MG tablet Commonly known as: ALDACTONE Take 1 tablet (100 mg total) by mouth daily.   sucralfate 1 g tablet Commonly known as: CARAFATE Take 1 tablet (1 g total) by mouth 4 (four) times daily -  with meals and at bedtime.   thiamine 100 MG tablet Commonly known as: VITAMIN B1 Take 1 tablet (100 mg total) by mouth daily.        Follow-up Information     Nelwyn Salisbury, MD Follow up in 1 week(s).   Specialty: Family Medicine Contact information: [address] [place] [address] [phone]                   Time coordinating discharge: 39 minutes  Signed:  Collins Kerby  [facility] 06/12/2023, 10:10 AM

## 2023-06-12 NOTE — Plan of Care (Signed)

## 2023-06-17 IMAGING — CT CT HEAD W/O CM
3 series · 15 of 47 positions shown, 18 images · non-contrast
Comparison: 05/12/2021

CLINICAL DATA: Head trauma, mod-severe Head injury, fall in room

EXAM:
CT HEAD WITHOUT CONTRAST
TECHNIQUE: Contiguous axial images were obtained from the base of the skull
through the vertex without intravenous contrast.

[Series 4: head 5.0 h30s · axial · 0.46mm/px · z∈[-89,+56]mm · 9 of 35 slices shown, 12 images]
[im 3/35  brain]
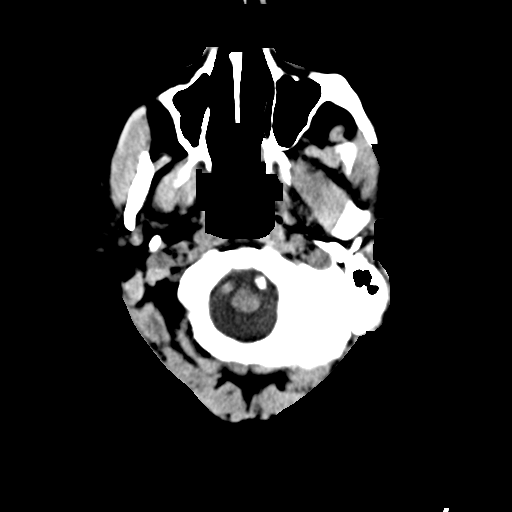
[im 3/35  bone]
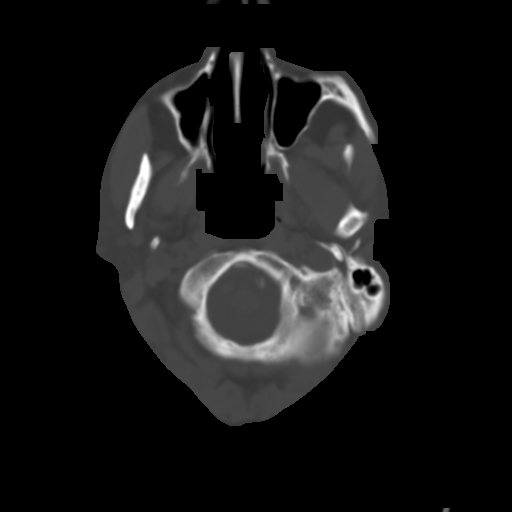
[im 6/35  brain]
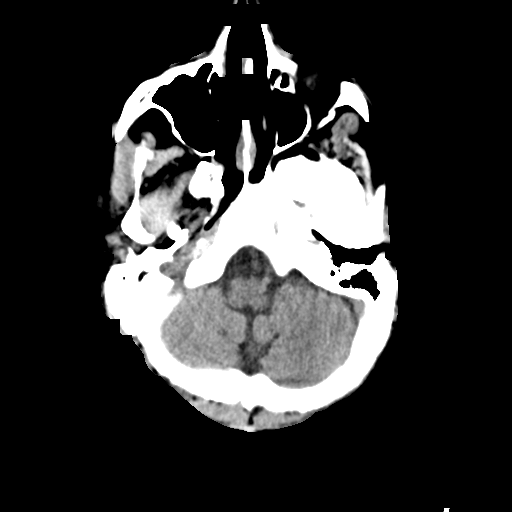
[im 10/35  brain]
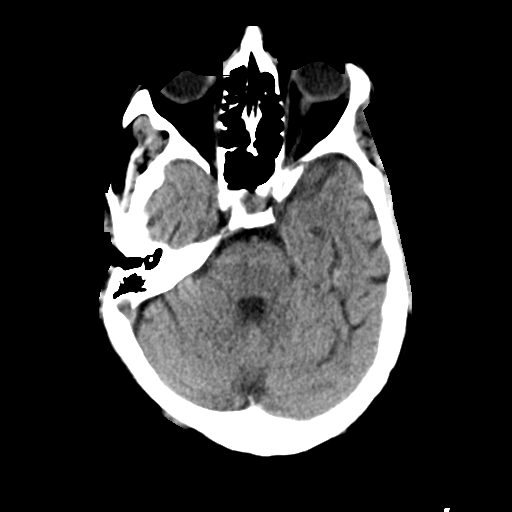
[im 13/35  brain]
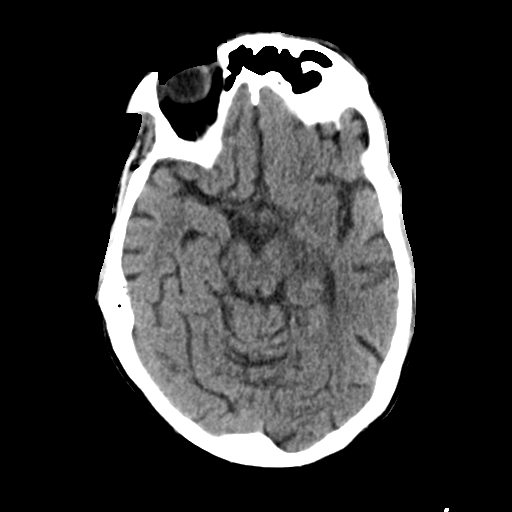
[im 18/35  brain]
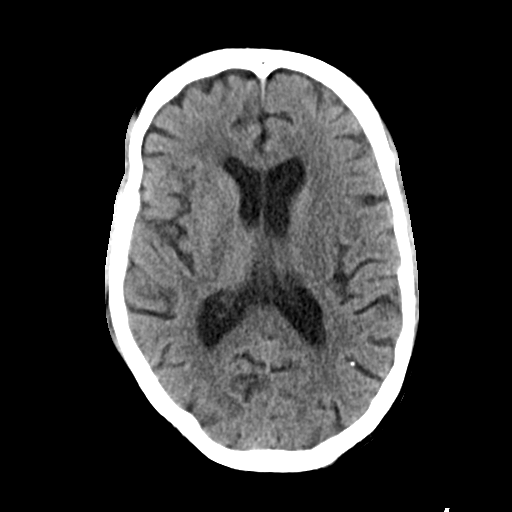
[im 18/35  bone]
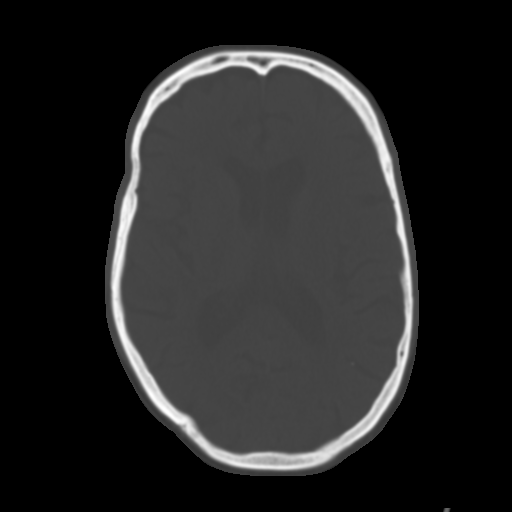
[im 22/35  brain]
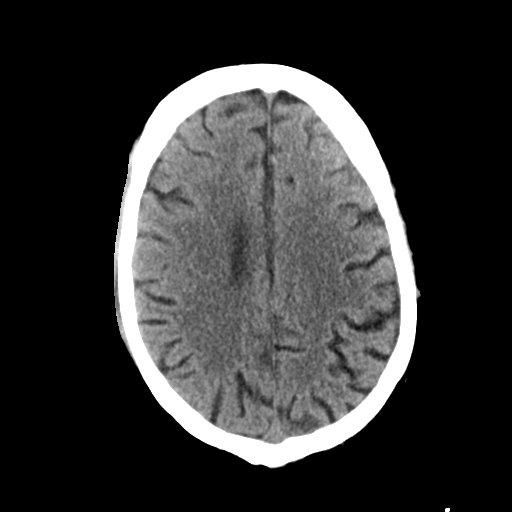
[im 25/35  brain]
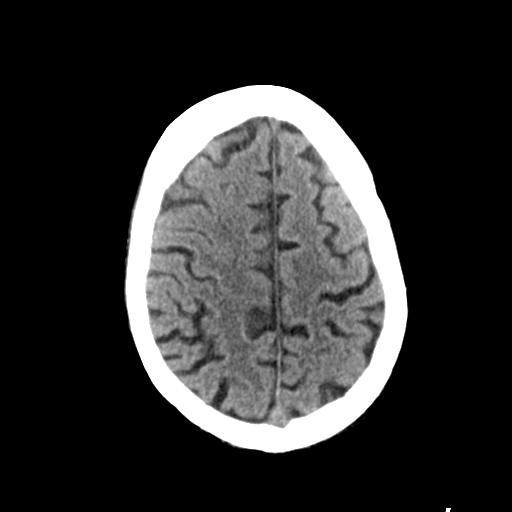
[im 29/35  brain]
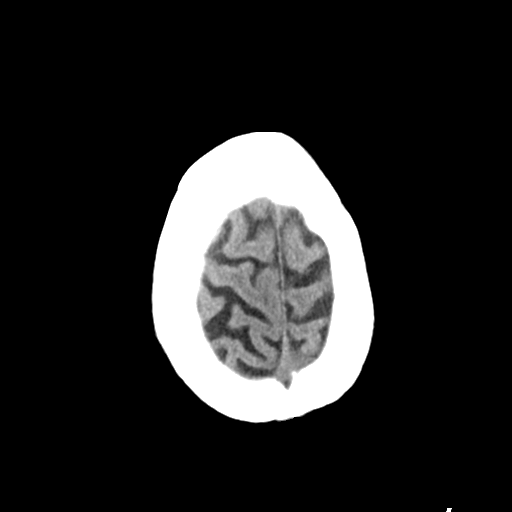
[im 32/35  brain]
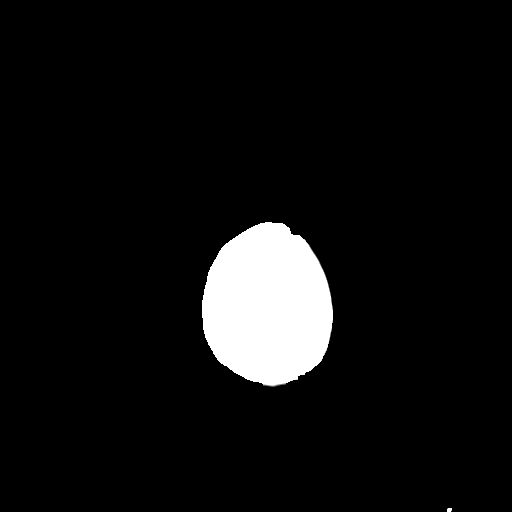
[im 32/35  bone]
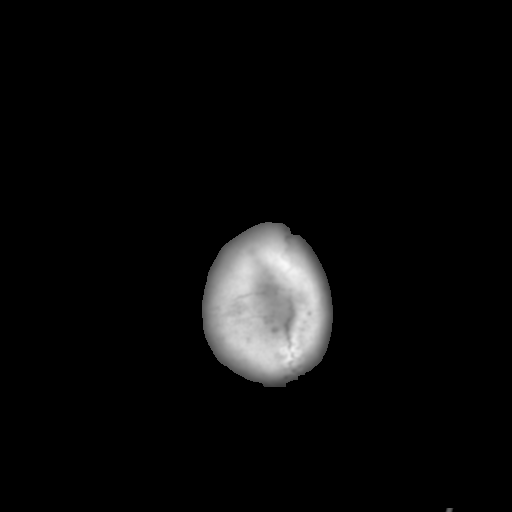

[Series 5: head 3.0 mpr cor · coronal · 0.34mm/px · 3 of 70 slices shown]
[im 24/70  brain]
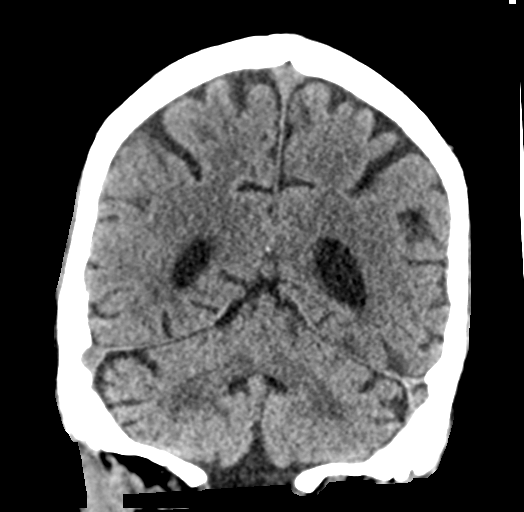
[im 31/70  brain]
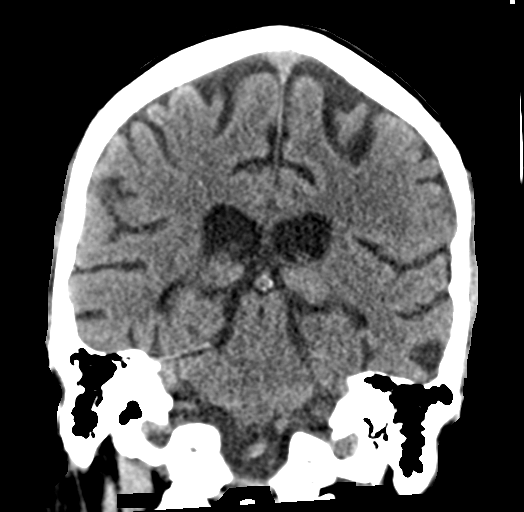
[im 39/70  brain]
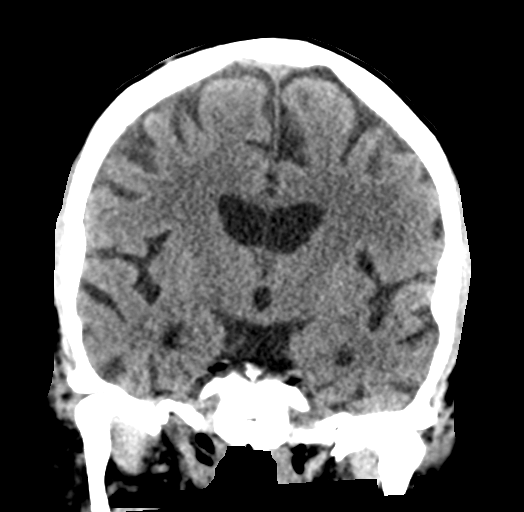

[Series 6: head 3.0 mpr sag · sagittal · 0.34mm/px · 3 of 56 slices shown]
[im 19/56  brain]
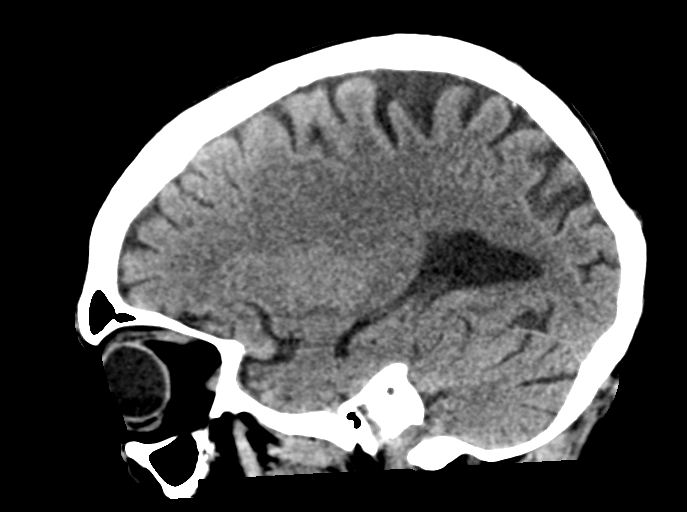
[im 28/56  brain]
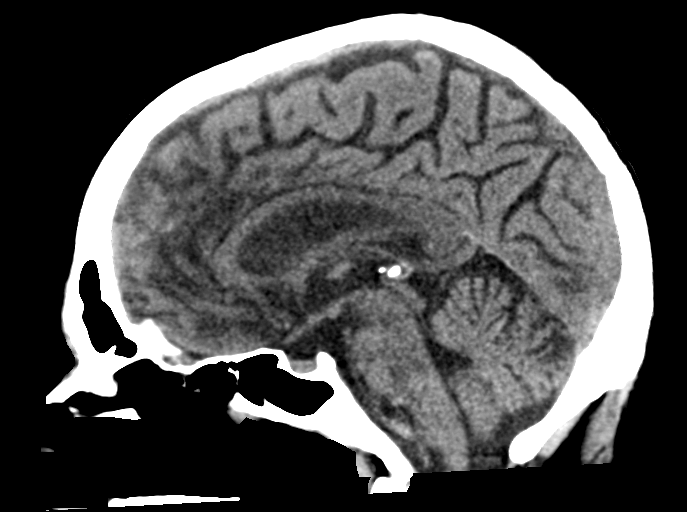
[im 37/56  brain]
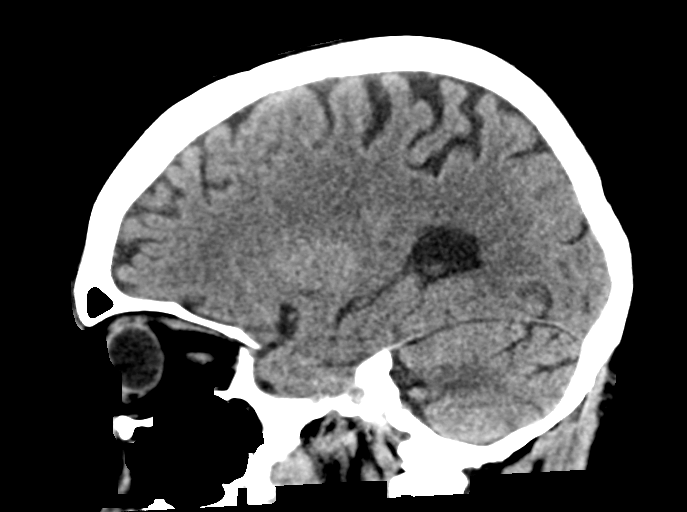

[15 of 47 positions shown; findings below may reference images not displayed]

FINDINGS: Brain: Normal anatomic configuration. Parenchymal volume loss
appears slightly advanced in relation to the patient's given age.
Mild periventricular white matter changes are present likely
reflecting the sequela of small vessel ischemia. No abnormal intra
or extra-axial mass lesion or fluid collection. No abnormal mass
effect or midline shift. No evidence of acute intracranial
hemorrhage or infarct. Ventricular size is normal. Cerebellum
unremarkable.

Vascular: No asymmetric hyperdense vasculature at the skull base.

Skull: Intact

Sinuses/Orbits: Paranasal sinuses are clear. Orbits are
unremarkable.

Other: Mastoid air cells and middle ear cavities are clear.
IMPRESSION: No acute intracranial abnormality.

Mild senescent change, advanced in relation of the patient's stated
age. This, however, appears stable since prior examination.

## 2023-06-17 IMAGING — US IR PARACENTESIS
1 series · 4 of 4 positions shown · non-contrast
Comparison: none

INDICATION: Abdominal distension. Request for therapeutic and diagnostic
paracentesis.

[Series 1: ir (person_name)/(person_name) · 4 of 4 slices shown]
[im 1/4]
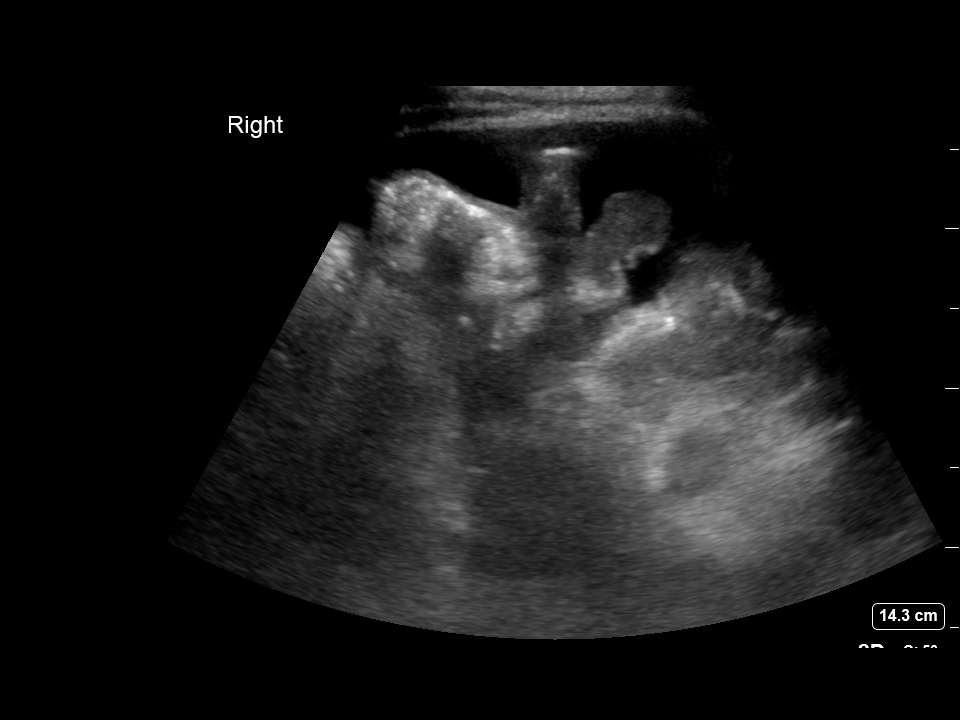
[im 2/4]
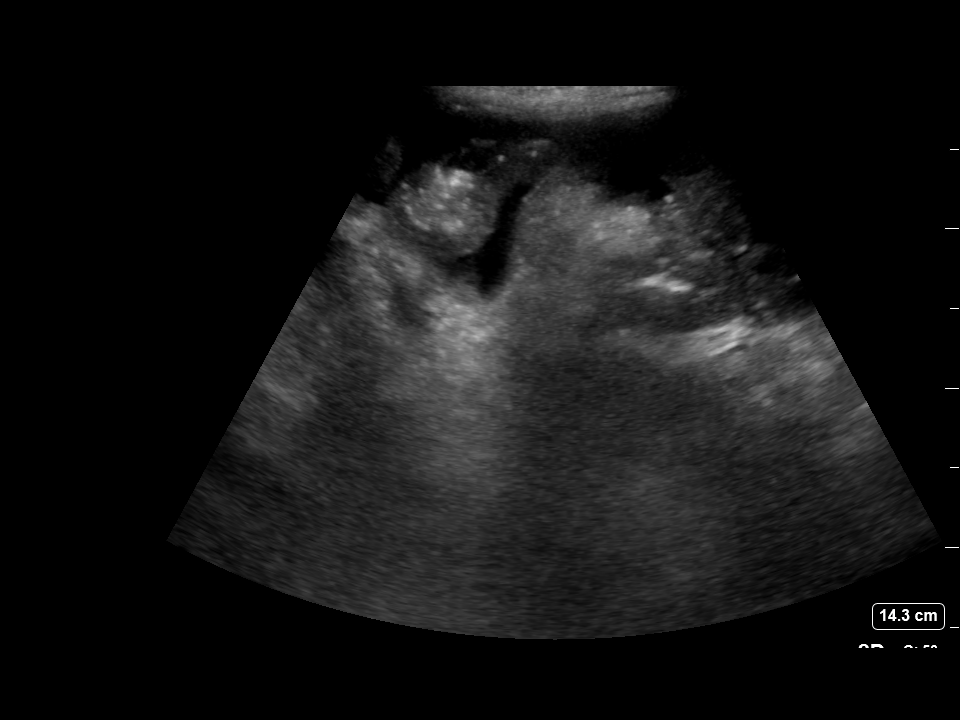
[im 3/4]
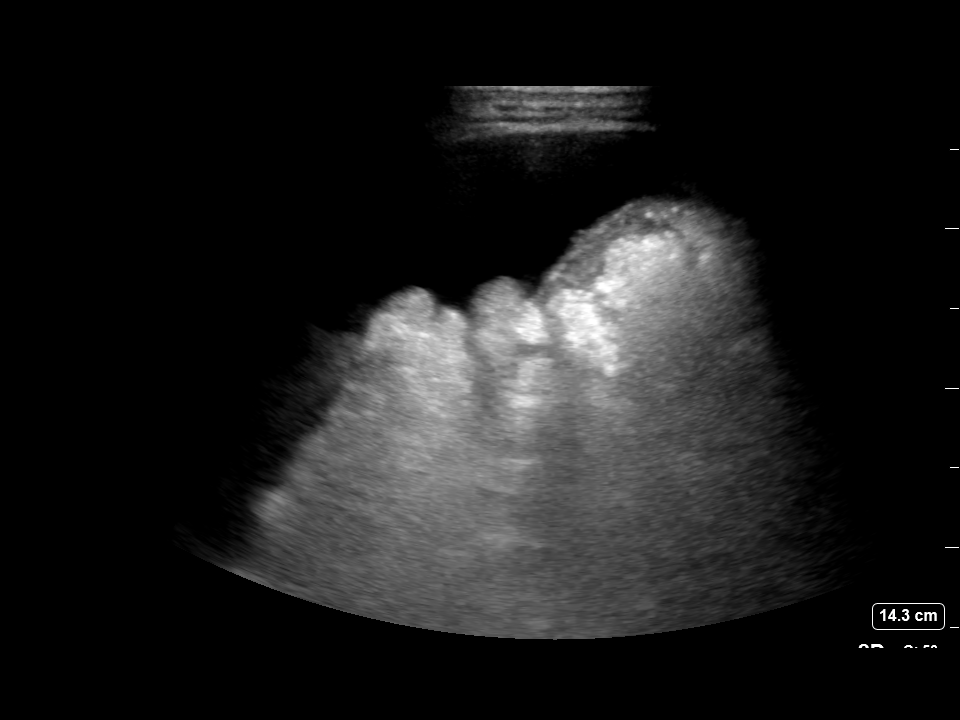
[im 4/4]
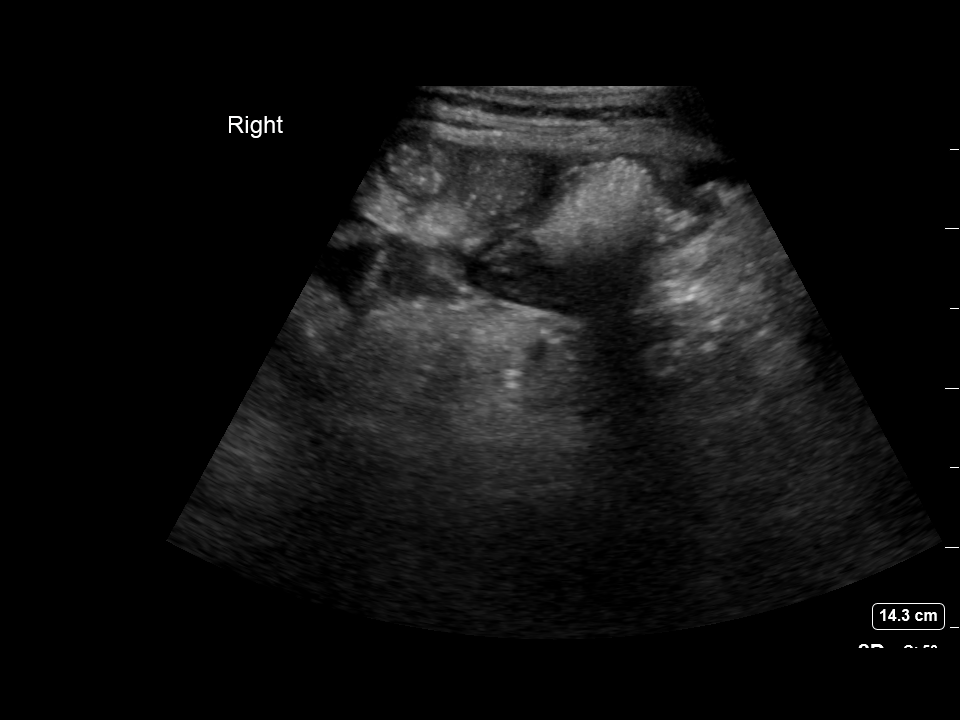

[4 of 4 positions shown; findings below may reference images not displayed]

EXAM:
ULTRASOUND GUIDED  PARACENTESIS

MEDICATIONS:
10 mL 1% lidocaine

COMPLICATIONS:
None immediate.

PROCEDURE:
Informed written consent was obtained from the patient after a
discussion of the risks, benefits and alternatives to treatment. A
timeout was performed prior to the initiation of the procedure.

Initial ultrasound scanning demonstrates a large amount of ascites
within the right lower abdominal quadrant. The right lower abdomen
was prepped and draped in the usual sterile fashion. 1% lidocaine
was used for local anesthesia.

Following this, a 19 gauge, 7-cm, Yueh catheter was introduced. An
ultrasound image was saved for documentation purposes. The
paracentesis was performed. The catheter was removed and a dressing
was applied. The patient tolerated the procedure well without
immediate post procedural complication.
FINDINGS: A total of approximately 1. 3 L of hazy pale yellow fluid was
removed. Samples were sent to the laboratory as requested by the
clinical team.
IMPRESSION: Successful ultrasound-guided paracentesis yielding 1.3 liters of
peritoneal fluid.

## 2023-06-21 ENCOUNTER — Emergency Department (HOSPITAL_COMMUNITY)
Admission: EM | Admit: 2023-06-21 | Discharge: 2023-06-22 | Disposition: A | Discharge status: Home / Self Care | Payer: Medicaid Other | Attending: Emergency Medicine | Admitting: Emergency Medicine

## 2023-06-21 DIAGNOSIS — Z7984 Long term (current) use of oral hypoglycemic drugs: Secondary | ICD-10-CM | POA: Insufficient documentation

## 2023-06-21 DIAGNOSIS — Z79899 Other long term (current) drug therapy: Secondary | ICD-10-CM | POA: Insufficient documentation

## 2023-06-21 DIAGNOSIS — Z794 Long term (current) use of insulin: Secondary | ICD-10-CM | POA: Diagnosis not present

## 2023-06-21 DIAGNOSIS — R251 Tremor, unspecified: Secondary | ICD-10-CM | POA: Diagnosis present

## 2023-06-21 DIAGNOSIS — E119 Type 2 diabetes mellitus without complications: Secondary | ICD-10-CM | POA: Insufficient documentation

## 2023-06-21 DIAGNOSIS — E871 Hypo-osmolality and hyponatremia: Secondary | ICD-10-CM | POA: Insufficient documentation

## 2023-06-21 DIAGNOSIS — I10 Essential (primary) hypertension: Secondary | ICD-10-CM | POA: Insufficient documentation

## 2023-06-21 LAB — COMPREHENSIVE METABOLIC PANEL
ALT: 20 U/L (ref 0–44)
AST: 59 U/L — ABNORMAL HIGH (ref 15–41)
Albumin: 3.1 g/dL — ABNORMAL LOW (ref 3.5–5.0)
Alkaline Phosphatase: 122 U/L (ref 38–126)
Anion gap: 10 (ref 5–15)
BUN: 15 mg/dL (ref 6–20)
CO2: 15 mmol/L — ABNORMAL LOW (ref 22–32)
Calcium: 8.4 mg/dL — ABNORMAL LOW (ref 8.9–10.3)
Chloride: 105 mmol/L (ref 98–111)
Creatinine, Ser: 1.44 mg/dL — ABNORMAL HIGH (ref 0.61–1.24)
GFR, Estimated: 57 mL/min — ABNORMAL LOW (ref >60)
Glucose, Bld: 152 mg/dL — ABNORMAL HIGH (ref 70–99)
Potassium: 3.8 mmol/L (ref 3.5–5.1)
Sodium: 130 mmol/L — ABNORMAL LOW (ref 135–145)
Total Bilirubin: 2.9 mg/dL — ABNORMAL HIGH (ref 0.0–1.2)
Total Protein: 7.6 g/dL (ref 6.5–8.1)

## 2023-06-21 LAB — CBC WITH DIFFERENTIAL/PLATELET
Abs Immature Granulocytes: 0.01 10*3/uL (ref 0.00–0.07)
Basophils Absolute: 0 10*3/uL (ref 0.0–0.1)
Basophils Relative: 0 %
Eosinophils Absolute: 0.1 10*3/uL (ref 0.0–0.5)
Eosinophils Relative: 3 %
HCT: 28.2 % — ABNORMAL LOW (ref 39.0–52.0)
Hemoglobin: 9.6 g/dL — ABNORMAL LOW (ref 13.0–17.0)
Immature Granulocytes: 0 %
Lymphocytes Relative: 18 %
Lymphs Abs: 0.4 10*3/uL — ABNORMAL LOW (ref 0.7–4.0)
MCH: 32 pg (ref 26.0–34.0)
MCHC: 34 g/dL (ref 30.0–36.0)
MCV: 94 fL (ref 80.0–100.0)
Monocytes Absolute: 0.2 10*3/uL (ref 0.1–1.0)
Monocytes Relative: 8 %
Neutro Abs: 1.8 10*3/uL (ref 1.7–7.7)
Neutrophils Relative %: 71 %
Platelets: 85 10*3/uL — ABNORMAL LOW (ref 150–400)
RBC: 3 MIL/uL — ABNORMAL LOW (ref 4.22–5.81)
RDW: 16.8 % — ABNORMAL HIGH (ref 11.5–15.5)
WBC: 2.5 10*3/uL — ABNORMAL LOW (ref 4.0–10.5)
nRBC: 0 % (ref 0.0–0.2)

## 2023-06-21 LAB — ETHANOL: Alcohol, Ethyl (B): 25 mg/dL — ABNORMAL HIGH (ref <10)

## 2023-06-21 NOTE — ED Triage Notes (Signed)
 Pt reports tremors full body x this AM at 5 , and gradually worsening.Pt denies ETOH use since September and denies drug use, reports hx of this once before but not as severe and went away on its own.

## 2023-06-22 ENCOUNTER — Other Ambulatory Visit: Payer: Self-pay | Admitting: Family Medicine

## 2023-06-22 ENCOUNTER — Encounter (HOSPITAL_COMMUNITY): Payer: Self-pay

## 2023-06-22 LAB — RAPID URINE DRUG SCREEN, HOSP PERFORMED
Amphetamines: NOT DETECTED
Barbiturates: NOT DETECTED
Benzodiazepines: NOT DETECTED
Cocaine: NOT DETECTED
Opiates: NOT DETECTED
Tetrahydrocannabinol: NOT DETECTED

## 2023-06-22 MED ORDER — OXYCODONE HCL 5 MG PO TABS
5.0000 mg | ORAL_TABLET | Freq: Once | ORAL | Status: AC
  Administered 2023-06-22: 5 mg via ORAL
  Filled 2023-06-22: qty 1

## 2023-06-22 MED ORDER — OXYCODONE HCL 5 MG PO TABS: 5.0000 mg | ORAL_TABLET | Freq: Four times a day (QID) | ORAL | 0 refills | Status: DC | PRN

## 2023-06-22 NOTE — ED Notes (Signed)
 Pt ambulatory to bathroom w/o assist. Pt reports feeling "slight dizziness" and pain in his neck and "diaphragm" while ambulating. Noted steady gait.

## 2023-06-22 NOTE — ED Provider Notes (Signed)
 [place] EMERGENCY DEPARTMENT AT [facility] Provider Note   CSN: [id number] Arrival date & time: 06/21/23  2048     History  Chief Complaint  Patient presents with   Tremors    Martin Anthony is a 59 y.o. male.  HPI Patient presents with tremor in his head.  States that for the last day or 2.  States difficulty sleeping due to the pain in the back of his neck.  No fevers.  Had been discharged from hospital around a week ago for pancreatitis.  Has been on pain meds.  States he just finished that up couple days ago 2.  Somewhat decreased oral intake.  States that shaking is not in his arms or legs, just in his head and neck.   Past Medical History:  Diagnosis Date   Anxiety    Ascites due to alcoholic cirrhosis (HCC)    Cirrhosis with alcoholism (HCC)    Depression    Diabetes mellitus without complication (HCC)    GERD (gastroesophageal reflux disease)    Gout    Hyperlipidemia    Hypertension    Pancreatitis    Primary adenocarcinoma of ascending colon (HCC)    Substance abuse (HCC)    alcoholism    Type 2 diabetes mellitus with diabetic neuropathy (HCC)     Home Medications Prior to Admission medications   Medication Sig Start Date End Date Taking? Authorizing Provider  carvedilol  (COREG ) 3.125 MG tablet Take 3.125 mg by mouth 2 (two) times daily with a meal.    [provider]  folic acid  (FOLVITE ) 1 MG tablet Take 1 tablet (1 mg total) by mouth daily. 05/21/21   Singh, Prashant K, MD  gabapentin  (NEURONTIN ) 100 MG capsule Take 1 capsule (100 mg total) by mouth 3 (three) times daily. Patient taking differently: Take 100 mg by mouth 2 (two) times daily. 04/08/23 05/19/24  Vernon Ranks, MD  gabapentin  (NEURONTIN ) 300 MG capsule Take 300 mg by mouth at bedtime.    [provider]  insulin  glargine (LANTUS ) 100 UNIT/ML Solostar Pen Inject 10 Units into the skin at bedtime. 06/12/23   Pokhrel, Vernal, MD  Insulin  Pen Needle 32G X 8 MM MISC Use  as directed 06/12/23   Pokhrel, Laxman, MD  Iron , Ferrous Sulfate , 325 (65 Fe) MG TABS Take 325 mg by mouth daily. 12/18/21   Johnny Garnette LABOR, MD  ketoconazole  (NIZORAL ) 2 % cream Apply 1 Application topically 2 (two) times daily as needed for irritation.    [provider]  metformin  (FORTAMET ) 500 MG (OSM) 24 hr tablet Take 1 tablet (500 mg total) by mouth daily with breakfast. 06/12/23 07/12/23  Pokhrel, Vernal, MD  midodrine  (PROAMATINE ) 5 MG tablet Take 1 tablet (5 mg total) by mouth 3 (three) times daily with meals. 06/12/23 07/12/23  Pokhrel, Laxman, MD  nicotine  (NICODERM CQ  - DOSED IN MG/24 HOURS) 14 mg/24hr patch Place 1 patch (14 mg total) onto the skin daily. 04/08/23 04/07/24  Vernon Ranks, MD  nystatin  (MYCOSTATIN /NYSTOP ) powder Apply 1 Application topically 3 (three) times daily. Patient taking differently: Apply 1 Application topically 3 (three) times daily as needed (Ostomy changes). 05/03/23   Johnny Garnette LABOR, MD  ondansetron  (ZOFRAN ) 4 MG tablet Take 1 tablet (4 mg total) by mouth every 6 (six) hours as needed for nausea. 06/12/23   Pokhrel, Vernal, MD  oxyCODONE  (OXY IR/ROXICODONE ) 5 MG immediate release tablet Take 1 tablet (5 mg total) by mouth every 6 (six) hours as needed  for moderate pain (pain score 4-6) or severe pain (pain score 7-10). 06/22/23   Patsey Lot, MD  pantoprazole  (PROTONIX ) 40 MG tablet Take 1 tablet (40 mg total) by mouth daily. 06/11/23   Christobal Guadalajara, MD  rifaximin  (XIFAXAN ) 550 MG TABS tablet Take 1 tablet (550 mg total) by mouth 2 (two) times daily. 01/20/22   Johnny Garnette LABOR, MD  sertraline  (ZOLOFT ) 50 MG tablet Take 1 tablet (50 mg total) by mouth daily. 02/02/23   Johnny Garnette LABOR, MD  spironolactone  (ALDACTONE ) 100 MG tablet Take 1 tablet (100 mg total) by mouth daily. 05/13/23 06/12/23  Christobal Guadalajara, MD  sucralfate  (CARAFATE ) 1 g tablet Take 1 tablet (1 g total) by mouth 4 (four) times daily -  with meals and at bedtime. 06/12/23 07/12/23  Pokhrel,  Laxman, MD  thiamine  100 MG tablet Take 1 tablet (100 mg total) by mouth daily. 05/21/21   Singh, Prashant K, MD      Allergies    Acetaminophen  and Ibuprofen    Review of Systems   Review of Systems  Physical Exam Updated Vital Signs BP (!) 146/75   Pulse 65   Temp 99.7 F (37.6 C) (Oral)   Resp 19   SpO2 100%  Physical Exam Vitals and nursing note reviewed.  HENT:     Head: Normocephalic.  Cardiovascular:     Rate and Rhythm: Normal rate.  Pulmonary:     Breath sounds: No wheezing.  Abdominal:     Tenderness: There is no abdominal tenderness.  Neurological:     Mental Status: He is alert.     Comments: Tremor to head and neck.  Mild neck tenderness.  Good range of motion.  No tremor in upper or lower extremities.  Awake and appropriate.     ED Results / Procedures / Treatments   Labs (all labs ordered are listed, but only abnormal results are displayed) Labs Reviewed  ETHANOL - Abnormal; Notable for the following components:      Result Value   Alcohol , Ethyl (B) 25 (*)    All other components within normal limits  CBC WITH DIFFERENTIAL/PLATELET - Abnormal; Notable for the following components:   WBC 2.5 (*)    RBC 3.00 (*)    Hemoglobin 9.6 (*)    HCT 28.2 (*)    RDW 16.8 (*)    Platelets 85 (*)    Lymphs Abs 0.4 (*)    All other components within normal limits  COMPREHENSIVE METABOLIC PANEL - Abnormal; Notable for the following components:   Sodium 130 (*)    CO2 15 (*)    Glucose, Bld 152 (*)    Creatinine, Ser 1.44 (*)    Calcium  8.4 (*)    Albumin  3.1 (*)    AST 59 (*)    Total Bilirubin 2.9 (*)    GFR, Estimated 57 (*)    All other components within normal limits  RAPID URINE DRUG SCREEN, HOSP PERFORMED    EKG None  Radiology No results found.  Procedures Procedures    Medications Ordered in ED Medications  oxyCODONE  (Oxy IR/ROXICODONE ) immediate release tablet 5 mg (5 mg Oral Given 06/22/23 1407)    ED Course/ Medical Decision  Making/ A&P                                 Medical Decision Making Amount and/or Complexity of Data Reviewed Labs: ordered.  Risk Prescription  drug management.   Patient presents with head neck pain with tremors.  States difficulty moving sleeping because of it.  Recently came off his pain medicines.  Does however have some chronic diseases.  LFTs are off.  Hyponatremia is within reported baseline but is lower than discharge a week ago.  Also low bicarb.  Alcohol  level mildly elevated at 25.  Does not have extremity tremors.  Pain medicine given since there may be a withdrawal component to this.  Will ambulate patient to see how he tolerates.  Tolerated ambulation.  Still some pain in his neck but feeling better after some treatment.  May be component of withdrawal.  Will give 4 more pills of pain medicine.  Follow-up with PCP.  Will discharge home.        Final Clinical Impression(s) / ED Diagnoses Final diagnoses:  Tremor    Rx / DC Orders ED Discharge Orders          Ordered    oxyCODONE  (OXY IR/ROXICODONE ) 5 MG immediate release tablet  Every 6 hours PRN        06/22/23 1629              Patsey Lot, MD 06/22/23 1629

## 2023-06-22 NOTE — ED Notes (Signed)
Pt presents with tremors

## 2023-06-28 LAB — ACID FAST CULTURE WITH REFLEXED SENSITIVITIES (MYCOBACTERIA): Acid Fast Culture: NEGATIVE

## 2023-07-09 ENCOUNTER — Encounter: Payer: Self-pay | Admitting: Family Medicine

## 2023-07-09 ENCOUNTER — Ambulatory Visit (INDEPENDENT_AMBULATORY_CARE_PROVIDER_SITE_OTHER): Payer: Medicaid Other | Admitting: Family Medicine

## 2023-07-09 ENCOUNTER — Other Ambulatory Visit: Payer: Self-pay | Admitting: Family Medicine

## 2023-07-09 VITALS — BP 126/74 | HR 58 | Temp 98.5°F | Wt 173.2 lb

## 2023-07-09 DIAGNOSIS — R7989 Other specified abnormal findings of blood chemistry: Secondary | ICD-10-CM

## 2023-07-09 DIAGNOSIS — K859 Acute pancreatitis without necrosis or infection, unspecified: Secondary | ICD-10-CM | POA: Diagnosis not present

## 2023-07-09 DIAGNOSIS — K7031 Alcoholic cirrhosis of liver with ascites: Secondary | ICD-10-CM

## 2023-07-09 DIAGNOSIS — R251 Tremor, unspecified: Secondary | ICD-10-CM

## 2023-07-09 DIAGNOSIS — I8511 Secondary esophageal varices with bleeding: Secondary | ICD-10-CM

## 2023-07-09 DIAGNOSIS — F418 Other specified anxiety disorders: Secondary | ICD-10-CM | POA: Diagnosis not present

## 2023-07-09 LAB — LIPASE: Lipase: 189 U/L — ABNORMAL HIGH (ref 11.0–59.0)

## 2023-07-09 LAB — CBC WITH DIFFERENTIAL/PLATELET
Basophils Absolute: 0 10*3/uL (ref 0.0–0.1)
Basophils Relative: 0.8 % (ref 0.0–3.0)
Eosinophils Absolute: 0.1 10*3/uL (ref 0.0–0.7)
Eosinophils Relative: 3.4 % (ref 0.0–5.0)
HCT: 28.4 % — ABNORMAL LOW (ref 39.0–52.0)
Hemoglobin: 9.4 g/dL — ABNORMAL LOW (ref 13.0–17.0)
Lymphocytes Relative: 16.4 % (ref 12.0–46.0)
Lymphs Abs: 0.4 10*3/uL — ABNORMAL LOW (ref 0.7–4.0)
MCHC: 33.2 g/dL (ref 30.0–36.0)
MCV: 96.3 fL (ref 78.0–100.0)
Monocytes Absolute: 0.2 10*3/uL (ref 0.1–1.0)
Monocytes Relative: 9.1 % (ref 3.0–12.0)
Neutro Abs: 1.8 10*3/uL (ref 1.4–7.7)
Neutrophils Relative %: 70.3 % (ref 43.0–77.0)
Platelets: 70 10*3/uL — ABNORMAL LOW (ref 150.0–400.0)
RBC: 2.95 Mil/uL — ABNORMAL LOW (ref 4.22–5.81)
RDW: 19.3 % — ABNORMAL HIGH (ref 11.5–15.5)
WBC: 2.5 10*3/uL — ABNORMAL LOW (ref 4.0–10.5)

## 2023-07-09 LAB — HEPATIC FUNCTION PANEL
ALT: 14 U/L (ref 0–53)
AST: 44 U/L — ABNORMAL HIGH (ref 0–37)
Albumin: 3.3 g/dL — ABNORMAL LOW (ref 3.5–5.2)
Alkaline Phosphatase: 177 U/L — ABNORMAL HIGH (ref 39–117)
Bilirubin, Direct: 0.8 mg/dL — ABNORMAL HIGH (ref 0.0–0.3)
Total Bilirubin: 1.8 mg/dL — ABNORMAL HIGH (ref 0.2–1.2)
Total Protein: 7.3 g/dL (ref 6.0–8.3)

## 2023-07-09 LAB — BASIC METABOLIC PANEL
BUN: 26 mg/dL — ABNORMAL HIGH (ref 6–23)
CO2: 20 meq/L (ref 19–32)
Calcium: 9.1 mg/dL (ref 8.4–10.5)
Chloride: 111 meq/L (ref 96–112)
Creatinine, Ser: 1.66 mg/dL — ABNORMAL HIGH (ref 0.40–1.50)
GFR: 45.89 mL/min — ABNORMAL LOW (ref >60.00)
Glucose, Bld: 158 mg/dL — ABNORMAL HIGH (ref 70–99)
Potassium: 5.3 meq/L — ABNORMAL HIGH (ref 3.5–5.1)
Sodium: 136 meq/L (ref 135–145)

## 2023-07-09 MED ORDER — NICOTINE 14 MG/24HR TD PT24
14.0000 mg | MEDICATED_PATCH | Freq: Every day | TRANSDERMAL | 1 refills | Status: AC
Start: 2023-07-09 — End: ?

## 2023-07-09 MED ORDER — MIDODRINE HCL 5 MG PO TABS: 5.0000 mg | ORAL_TABLET | Freq: Three times a day (TID) | ORAL | 1 refills | Status: DC

## 2023-07-09 MED ORDER — SUCRALFATE 1 G PO TABS: 1.0000 g | ORAL_TABLET | Freq: Three times a day (TID) | ORAL | 3 refills | Status: AC

## 2023-07-09 MED ORDER — SPIRONOLACTONE 100 MG PO TABS: 100.0000 mg | ORAL_TABLET | Freq: Every day | ORAL | 3 refills | Status: DC

## 2023-07-09 MED ORDER — RIFAXIMIN 550 MG PO TABS: 550.0000 mg | ORAL_TABLET | Freq: Two times a day (BID) | ORAL | 3 refills | Status: AC

## 2023-07-09 MED ORDER — METFORMIN HCL ER (OSM) 500 MG PO TB24: 500.0000 mg | ORAL_TABLET | Freq: Every day | ORAL | 3 refills | Status: DC

## 2023-07-09 MED ORDER — GABAPENTIN 100 MG PO CAPS: 100.0000 mg | ORAL_CAPSULE | Freq: Two times a day (BID) | ORAL | 1 refills | Status: AC

## 2023-07-09 MED ORDER — SERTRALINE HCL 50 MG PO TABS: 50.0000 mg | ORAL_TABLET | Freq: Every day | ORAL | 3 refills | Status: DC

## 2023-07-09 MED ORDER — GABAPENTIN 300 MG PO CAPS: 300.0000 mg | ORAL_CAPSULE | Freq: Every day | ORAL | 3 refills | Status: AC

## 2023-07-09 NOTE — Progress Notes (Signed)
Subjective:    Patient ID: Martin Anthony, male    DOB: 07-Mar-1966, 58 y.o.   MRN: [id number]  HPI Here to follow up a hospital stay from 05-07-23 to 05-12-23 for GI bleeding, for another stay from 06-06-23 to 06-12-23 for acute pancreatitis, and an ED visit on 06-21-23 for tremors of the head and hands. During the first stay he preented with bright red blood in his stool, and an EGD revealed the source of this to be gastritis and duodenitis. He has esophageal varices, but these did not appear to be bleeding. His Hgb on admission was 6.6, and this was up to 8.4 by DC. Lipase then was mildly elevated at 374. During the second stay he presented with severe upper abdominal pain and nausea without vomiting. The lipase was up to 4900, and a CT confirmed pancreatitis. The biliary tree was clear and the gall bladder was normal. His Hgb on admission was 8.0, and it was 7.8 by DC. His creatinine on admission was 3.24, and this was down to 1.89 by DC. An abdominal MRI also saw a 1.2 cm rim enhancing lesion in segment 3 of the liver, and a 3-6 month follow up MRI was recommended. Since then he has still had constant upper abdominal pain, but this is only bothersome after he eats a meal. He is careful to avoid certain foods, like fatty foods. No more nausea. His stool appears to be normal in the ostomy bag. After being home a few days he developed tremors of the head and upper body, so he went to the ED. His Hgb that day was 9.6 and the creatinine was 1.44. it was felt that the tremors were a form of withdrawal from the potent narcotic medications he had been taking. He was given a small supply of 5 mg Oxycodone, and by the time he finished taking these the tremors had stopped. Today he feels fairly well except for the upper abdominal pain that waxes and wanes.    Review of Systems  Constitutional: Negative.   Respiratory: Negative.    Cardiovascular: Negative.   Gastrointestinal:  Positive for abdominal pain.  Negative for abdominal distention, blood in stool, nausea and vomiting.  Genitourinary: Negative.   Neurological: Negative.        Objective:   Physical Exam Constitutional:      Appearance: Normal appearance. He is not ill-appearing.  Cardiovascular:     Rate and Rhythm: Normal rate and regular rhythm.     Pulses: Normal pulses.     Heart sounds: Normal heart sounds.  Pulmonary:     Effort: Pulmonary effort is normal.     Breath sounds: Normal breath sounds.  Abdominal:     General: Abdomen is flat. Bowel sounds are normal. There is no distension.     Palpations: Abdomen is soft. There is no mass.     Tenderness: There is no right CVA tenderness, left CVA tenderness, guarding or rebound.     Hernia: No hernia is present.     Comments: He is tender across the upper abdomen, worst under the left ribs   Neurological:     General: No focal deficit present.     Mental Status: He is alert and oriented to person, place, and time.           Assessment & Plan:  He is recovering from a GI bleed and an acute pancreatitis. His pancreas is still a bit inflamed. We will get labs today to follow  his pancreas, liver, and kidneys. He had a bout with some tremors that were likely the result of coming off narcotics too quickly. These have resolved. He splits his time between living in [place], and living with his mother in [place]. He considers his primary GI provider to be Dr. Ronita Hipps in [place]. Artie plans to return to [employer] next week, so he can have his follow up abdominal MRI while he is there. We spent a total of ( 35  ) minutes reviewing records and discussing these issues.  Gershon Crane, MD

## 2023-09-08 ENCOUNTER — Emergency Department (HOSPITAL_COMMUNITY): Payer: Self-pay

## 2023-09-08 ENCOUNTER — Emergency Department (HOSPITAL_COMMUNITY)
Admission: EM | Admit: 2023-09-08 | Discharge: 2023-09-09 | Disposition: A | Discharge status: Home / Self Care | Payer: Self-pay | Attending: Emergency Medicine | Admitting: Emergency Medicine

## 2023-09-08 ENCOUNTER — Encounter (HOSPITAL_COMMUNITY): Payer: Self-pay | Admitting: Emergency Medicine

## 2023-09-08 DIAGNOSIS — W1839XA Other fall on same level, initial encounter: Secondary | ICD-10-CM | POA: Insufficient documentation

## 2023-09-08 DIAGNOSIS — M25552 Pain in left hip: Secondary | ICD-10-CM | POA: Insufficient documentation

## 2023-09-08 DIAGNOSIS — Z7984 Long term (current) use of oral hypoglycemic drugs: Secondary | ICD-10-CM | POA: Insufficient documentation

## 2023-09-08 DIAGNOSIS — Z79899 Other long term (current) drug therapy: Secondary | ICD-10-CM | POA: Insufficient documentation

## 2023-09-08 DIAGNOSIS — Z23 Encounter for immunization: Secondary | ICD-10-CM | POA: Insufficient documentation

## 2023-09-08 DIAGNOSIS — E119 Type 2 diabetes mellitus without complications: Secondary | ICD-10-CM | POA: Insufficient documentation

## 2023-09-08 DIAGNOSIS — W19XXXA Unspecified fall, initial encounter: Secondary | ICD-10-CM

## 2023-09-08 DIAGNOSIS — M25562 Pain in left knee: Secondary | ICD-10-CM | POA: Insufficient documentation

## 2023-09-08 DIAGNOSIS — S0181XA Laceration without foreign body of other part of head, initial encounter: Secondary | ICD-10-CM | POA: Insufficient documentation

## 2023-09-08 DIAGNOSIS — Z794 Long term (current) use of insulin: Secondary | ICD-10-CM | POA: Insufficient documentation

## 2023-09-08 DIAGNOSIS — S61412A Laceration without foreign body of left hand, initial encounter: Secondary | ICD-10-CM | POA: Insufficient documentation

## 2023-09-08 DIAGNOSIS — I10 Essential (primary) hypertension: Secondary | ICD-10-CM | POA: Insufficient documentation

## 2023-09-08 LAB — CBG MONITORING, ED: Glucose-Capillary: 102 mg/dL — ABNORMAL HIGH (ref 70–99)

## 2023-09-08 LAB — CBC
HCT: 29.4 % — ABNORMAL LOW (ref 39.0–52.0)
Hemoglobin: 10 g/dL — ABNORMAL LOW (ref 13.0–17.0)
MCH: 34.1 pg — ABNORMAL HIGH (ref 26.0–34.0)
MCHC: 34 g/dL (ref 30.0–36.0)
MCV: 100.3 fL — ABNORMAL HIGH (ref 80.0–100.0)
Platelets: 106 10*3/uL — ABNORMAL LOW (ref 150–400)
RBC: 2.93 MIL/uL — ABNORMAL LOW (ref 4.22–5.81)
RDW: 14.5 % (ref 11.5–15.5)
WBC: 5.1 10*3/uL (ref 4.0–10.5)
nRBC: 0 % (ref 0.0–0.2)

## 2023-09-08 LAB — URINALYSIS, ROUTINE W REFLEX MICROSCOPIC
Bilirubin Urine: NEGATIVE
Glucose, UA: NEGATIVE mg/dL
Ketones, ur: NEGATIVE mg/dL
Leukocytes,Ua: NEGATIVE
Nitrite: NEGATIVE
Protein, ur: NEGATIVE mg/dL
Specific Gravity, Urine: 1.01 (ref 1.005–1.030)
pH: 5 (ref 5.0–8.0)

## 2023-09-08 LAB — BASIC METABOLIC PANEL
Anion gap: 9 (ref 5–15)
BUN: 23 mg/dL — ABNORMAL HIGH (ref 6–20)
CO2: 17 mmol/L — ABNORMAL LOW (ref 22–32)
Calcium: 8.7 mg/dL — ABNORMAL LOW (ref 8.9–10.3)
Chloride: 115 mmol/L — ABNORMAL HIGH (ref 98–111)
Creatinine, Ser: 1.74 mg/dL — ABNORMAL HIGH (ref 0.61–1.24)
GFR, Estimated: 45 mL/min — ABNORMAL LOW (ref >60)
Glucose, Bld: 132 mg/dL — ABNORMAL HIGH (ref 70–99)
Potassium: 3.9 mmol/L (ref 3.5–5.1)
Sodium: 141 mmol/L (ref 135–145)

## 2023-09-08 LAB — RAPID URINE DRUG SCREEN, HOSP PERFORMED
Amphetamines: NOT DETECTED
Barbiturates: NOT DETECTED
Benzodiazepines: NOT DETECTED
Cocaine: NOT DETECTED
Opiates: NOT DETECTED
Tetrahydrocannabinol: NOT DETECTED

## 2023-09-08 LAB — ETHANOL: Alcohol, Ethyl (B): 388 mg/dL (ref <10)

## 2023-09-08 MED ORDER — MORPHINE SULFATE (PF) 2 MG/ML IV SOLN
2.0000 mg | Freq: Once | INTRAVENOUS | Status: AC
  Administered 2023-09-09: 2 mg via INTRAVENOUS
  Filled 2023-09-08: qty 1

## 2023-09-08 MED ORDER — LIDOCAINE-EPINEPHRINE (PF) 2 %-1:200000 IJ SOLN
20.0000 mL | Freq: Once | INTRAMUSCULAR | Status: AC
  Administered 2023-09-08: 20 mL
  Filled 2023-09-08: qty 20

## 2023-09-08 NOTE — ED Triage Notes (Signed)
 Pt comes from home via [facility] for syncopal episode, ETOH on board, pt has large lac to top and bottom lip and L hand, unable to move thumb.

## 2023-09-09 ENCOUNTER — Other Ambulatory Visit: Payer: Self-pay

## 2023-09-09 MED ORDER — OXYCODONE HCL 5 MG PO TABS: 5.0000 mg | ORAL_TABLET | ORAL | 0 refills | Status: DC | PRN

## 2023-09-09 MED ORDER — THIAMINE MONONITRATE 100 MG PO TABS
100.0000 mg | ORAL_TABLET | Freq: Every day | ORAL | Status: DC
  Administered 2023-09-09: 100 mg via ORAL
  Filled 2023-09-09: qty 1

## 2023-09-09 MED ORDER — ADULT MULTIVITAMIN W/MINERALS CH: 1.0000 | ORAL_TABLET | Freq: Every day | ORAL | Status: DC

## 2023-09-09 MED ORDER — ADULT MULTIVITAMIN W/MINERALS CH
1.0000 | ORAL_TABLET | Freq: Every day | ORAL | Status: DC
  Administered 2023-09-09: 1 via ORAL
  Filled 2023-09-09: qty 1

## 2023-09-09 MED ORDER — OXYCODONE HCL 5 MG PO TABS
5.0000 mg | ORAL_TABLET | Freq: Once | ORAL | Status: AC
  Administered 2023-09-09: 5 mg via ORAL
  Filled 2023-09-09: qty 1

## 2023-09-09 MED ORDER — TETANUS-DIPHTH-ACELL PERTUSSIS 5-2.5-18.5 LF-MCG/0.5 IM SUSY
0.5000 mL | PREFILLED_SYRINGE | Freq: Once | INTRAMUSCULAR | Status: AC
  Administered 2023-09-09: 0.5 mL via INTRAMUSCULAR
  Filled 2023-09-09: qty 0.5

## 2023-09-09 MED ORDER — BACITRACIN ZINC 500 UNIT/GM EX OINT: TOPICAL_OINTMENT | Freq: Two times a day (BID) | CUTANEOUS | Status: AC

## 2023-09-09 MED ORDER — FOLIC ACID 1 MG PO TABS: 1.0000 mg | ORAL_TABLET | Freq: Every day | ORAL | Status: DC

## 2023-09-09 MED ORDER — THIAMINE HCL 100 MG/ML IJ SOLN: 100.0000 mg | Freq: Every day | INTRAMUSCULAR | Status: DC

## 2023-09-09 MED ORDER — THIAMINE MONONITRATE 100 MG PO TABS: 100.0000 mg | ORAL_TABLET | Freq: Every day | ORAL | Status: DC

## 2023-09-09 MED ORDER — BACITRACIN ZINC 500 UNIT/GM EX OINT
TOPICAL_OINTMENT | CUTANEOUS | Status: AC
  Administered 2023-09-09: 1 via TOPICAL
  Filled 2023-09-09: qty 0.9

## 2023-09-09 MED ORDER — FOLIC ACID 1 MG PO TABS
1.0000 mg | ORAL_TABLET | Freq: Every day | ORAL | Status: DC
  Administered 2023-09-09: 1 mg via ORAL
  Filled 2023-09-09: qty 1

## 2023-09-09 NOTE — Discharge Instructions (Addendum)
 It was a pleasure caring for you today. Please follow up with your primary care provider. Seek emergency care if experiencing any new or worsening symptoms. If you hand pain is not resolving appropriately in the next 7 days, you will need a repeat hand xray.   You have 4 absorbable sutures in your lip and 4 non-absorbable sutures in your left hand.  Absorbable sutures: Keep sutures dry. Please refrain from swimming or bathing. Area can be gently cleaned with mild soap and water after 24-48 hours. Sutures will absorb and fall out within 7-10 days.  Non-asorbable sutures: Keep sutures dry. Please refrain from swimming or bathing. Area can be gently cleaned with mild soap and water after 24 hours. Please return to local urgent care or primary care provider in 10 to 14 days for suture removal. Failure to remove sutures in timely manner can result in infection.

## 2023-09-09 NOTE — ED Notes (Addendum)
 Bacitracin applied to suture site/laceration to L palm, non-adherent placed, wrapped with rolled gauze.

## 2023-09-09 NOTE — ED Provider Notes (Signed)
 [place] EMERGENCY DEPARTMENT AT [facility] Provider Note   CSN: [id number] Arrival date & time: 09/08/23  2055     History  Chief Complaint  Patient presents with   Loss of Consciousness    Martin Anthony is a 58 y.o. male with PMHx anxiety, alcoholic cirrhosis, depression, DM, GERD, HLD, HTN who presents to ED concerned for fall. Patient was drinking ETOH tonight and states that he woke up in a pool of blood. Patient with lacerations of upper/lower left lip and left hand. Patient denies any recent infectious symptoms. Patient concerned for the amount of bleeding because he states that he is usually anemic. Patient complaining of pain in face, left hand, left knee, and left hip.   Loss of Consciousness      Home Medications Prior to Admission medications   Medication Sig Start Date End Date Taking? Authorizing Provider  carvedilol (COREG) 3.125 MG tablet Take 3.125 mg by mouth 2 (two) times daily with a meal.   Yes [provider]  folic acid (FOLVITE) 1 MG tablet Take 1 tablet (1 mg total) by mouth daily. 05/21/21  Yes Leroy Sea, MD  gabapentin (NEURONTIN) 100 MG capsule Take 1 capsule (100 mg total) by mouth 2 (two) times daily. 07/09/23  Yes Nelwyn Salisbury, MD  gabapentin (NEURONTIN) 300 MG capsule Take 1 capsule (300 mg total) by mouth at bedtime. 07/09/23  Yes Nelwyn Salisbury, MD  insulin glargine (LANTUS) 100 UNIT/ML Solostar Pen Inject 10 Units into the skin at bedtime. Patient taking differently: Inject 6 Units into the skin at bedtime. 06/12/23  Yes Pokhrel, Laxman, MD  Iron, Ferrous Sulfate, 325 (65 Fe) MG TABS Take 325 mg by mouth daily. 12/18/21  Yes Nelwyn Salisbury, MD  metformin (FORTAMET) 500 MG (OSM) 24 hr tablet Take 1 tablet (500 mg total) by mouth daily with breakfast. 07/09/23  Yes Nelwyn Salisbury, MD  midodrine (PROAMATINE) 5 MG tablet Take 1 tablet (5 mg total) by mouth 3 (three) times daily with meals. 07/09/23  Yes Nelwyn Salisbury, MD   nicotine (NICODERM CQ - DOSED IN MG/24 HOURS) 14 mg/24hr patch Place 1 patch (14 mg total) onto the skin daily. 07/09/23  Yes Nelwyn Salisbury, MD  oxyCODONE (ROXICODONE) 5 MG immediate release tablet Take 1 tablet (5 mg total) by mouth every 4 (four) hours as needed for up to 3 doses for severe pain (pain score 7-10). 09/09/23  Yes Valrie Hart F, PA-C  pantoprazole (PROTONIX) 40 MG tablet Take 1 tablet (40 mg total) by mouth daily. 06/11/23  Yes Lanae Boast, MD  rifaximin (XIFAXAN) 550 MG TABS tablet Take 1 tablet (550 mg total) by mouth 2 (two) times daily. 07/09/23  Yes Nelwyn Salisbury, MD  sertraline (ZOLOFT) 50 MG tablet Take 1 tablet (50 mg total) by mouth daily. 07/09/23  Yes Nelwyn Salisbury, MD  spironolactone (ALDACTONE) 100 MG tablet Take 1 tablet (100 mg total) by mouth daily. 07/09/23  Yes Nelwyn Salisbury, MD  sucralfate (CARAFATE) 1 g tablet Take 1 tablet (1 g total) by mouth 4 (four) times daily -  with meals and at bedtime. 07/09/23  Yes Nelwyn Salisbury, MD  thiamine 100 MG tablet Take 1 tablet (100 mg total) by mouth daily. 05/21/21  Yes Leroy Sea, MD  Insulin Pen Needle 32G X 8 MM MISC Use as directed 06/12/23   Pokhrel, Laxman, MD  ondansetron (ZOFRAN) 4 MG tablet Take 1 tablet (4 mg  total) by mouth every 6 (six) hours as needed for nausea. Patient not taking: Reported on 09/08/2023 06/12/23   Joycelyn Das, MD      Allergies    Acetaminophen and Ibuprofen    Review of Systems   Review of Systems  Cardiovascular:  Positive for syncope.    Physical Exam Updated Vital Signs BP 137/74 (BP Location: Right Arm)   Pulse 66   Temp 98.6 F (37 C) (Oral)   Resp 16   SpO2 100%  Physical Exam Vitals and nursing note reviewed.  Constitutional:      General: He is not in acute distress.    Appearance: He is not ill-appearing or toxic-appearing.  HENT:     Head: Normocephalic.     Comments: 3cm laceration extending from left upper lip to left lower lip. No broken teeth  appreciated. No oropharyngeal lesions. Bruising on left side of face/eye. Vision intact.     Mouth/Throat:     Mouth: Mucous membranes are moist.     Pharynx: No oropharyngeal exudate or posterior oropharyngeal erythema.  Eyes:     General: No scleral icterus.       Right eye: No discharge.        Left eye: No discharge.     Conjunctiva/sclera: Conjunctivae normal.  Cardiovascular:     Rate and Rhythm: Normal rate and regular rhythm.     Pulses: Normal pulses.     Heart sounds: Normal heart sounds. No murmur heard. Pulmonary:     Effort: Pulmonary effort is normal. No respiratory distress.     Breath sounds: Normal breath sounds. No wheezing, rhonchi or rales.  Abdominal:     General: Abdomen is flat. Bowel sounds are normal. There is no distension.     Palpations: Abdomen is soft. There is no mass.     Tenderness: There is no abdominal tenderness.     Comments: Colostomy bag intact.  Musculoskeletal:     Right lower leg: No edema.     Left lower leg: No edema.  Skin:    General: Skin is warm and dry.     Findings: No rash.     Comments: 2cm laceration of left palm. Tenderness to palpation of entire left hand. +2 radial pulse. Brisk capillary refill. Sensation to light touch intact.   Neurological:     General: No focal deficit present.     Mental Status: He is alert. Mental status is at baseline.     Comments: GCS 15. Speech is goal oriented. No deficits appreciated to CN III-XII. Patient moves extremities without ataxia. Patient ambulatory with steady gait.   Psychiatric:        Mood and Affect: Mood normal.     ED Results / Procedures / Treatments   Labs (all labs ordered are listed, but only abnormal results are displayed) Labs Reviewed  BASIC METABOLIC PANEL - Abnormal; Notable for the following components:      Result Value   Chloride 115 (*)    CO2 17 (*)    Glucose, Bld 132 (*)    BUN 23 (*)    Creatinine, Ser 1.74 (*)    Calcium 8.7 (*)    GFR, Estimated 45  (*)    All other components within normal limits  CBC - Abnormal; Notable for the following components:   RBC 2.93 (*)    Hemoglobin 10.0 (*)    HCT 29.4 (*)    MCV 100.3 (*)    MCH 34.1 (*)  Platelets 106 (*)    All other components within normal limits  URINALYSIS, ROUTINE W REFLEX MICROSCOPIC - Abnormal; Notable for the following components:   Hgb urine dipstick MODERATE (*)    Bacteria, UA RARE (*)    All other components within normal limits  ETHANOL - Abnormal; Notable for the following components:   Alcohol, Ethyl (B) 388 (*)    All other components within normal limits  CBG MONITORING, ED - Abnormal; Notable for the following components:   Glucose-Capillary 102 (*)    All other components within normal limits  RAPID URINE DRUG SCREEN, HOSP PERFORMED  CBG MONITORING, ED    EKG None  Radiology DG Hand 2 View Left Result Date: 09/09/2023 CLINICAL DATA:  foreign bodies EXAM: LEFT HAND - 2 VIEW COMPARISON:  None Available. FINDINGS: There is no evidence of fracture or dislocation. There is no evidence of arthropathy or other focal bone abnormality. Soft tissues are unremarkable. IMPRESSION: Negative. Electronically Signed   By: Tish Frederickson M.D.   On: 09/09/2023 00:07   DG Knee Complete 4 Views Left Result Date: 09/08/2023 CLINICAL DATA:  pain EXAM: LEFT KNEE - COMPLETE 4+ VIEW COMPARISON:  None Available. FINDINGS: No evidence of fracture, dislocation, or joint effusion. No evidence of arthropathy or other focal bone abnormality. Soft tissues are unremarkable. IMPRESSION: Negative. Electronically Signed   By: Tish Frederickson M.D.   On: 09/08/2023 22:40   DG Hip Unilat W or Wo Pelvis 2-3 Views Left Result Date: 09/08/2023 CLINICAL DATA:  Pain Per chart: Pt comes from home [facility] for syncopal episode, ETOH on board, pt has large lac to top and bottom lip and L hand, unable to move thumb. EXAM: DG HIP (WITH OR WITHOUT PELVIS) 2-3V LEFT COMPARISON:  None Available.  FINDINGS: Limited evaluation due to overlapping osseous structures and overlying soft tissues. There is no evidence of hip fracture or dislocation of the left hip. No acute displaced fracture or dislocation of the right hip on frontal view. No acute displaced fracture or diastasis of the bones of the pelvis. There is no evidence of arthropathy or other focal bone abnormality. IMPRESSION: Negative for acute traumatic injury. Electronically Signed   By: Tish Frederickson M.D.   On: 09/08/2023 22:40   DG Hand Complete Left Result Date: 09/08/2023 CLINICAL DATA:  Pain Per chart: Pt comes from home via [facility] for syncopal episode, ETOH on board, pt has large lac to top and bottom lip and L hand, unable to move thumb. EXAM: LEFT HAND - COMPLETE 3+ VIEW COMPARISON:  None Available. FINDINGS: There is no evidence of fracture or dislocation. There is no evidence of arthropathy or other focal bone abnormality. Volar hand soft tissue defect poorly visualized with multiple hyperdense retained radiopaque foreign bodies measuring up to at least 5 mm. IMPRESSION: 1. Volar hand soft tissue defect poorly visualized with multiple hyperdense retained radiopaque foreign bodies measuring up to at least 5 mm-query glass. 2.  No acute displaced fracture or dislocation. Electronically Signed   By: Tish Frederickson M.D.   On: 09/08/2023 22:35   CT HEAD WO CONTRAST Result Date: 09/08/2023 CLINICAL DATA:  Facial trauma, blunt; Polytrauma, blunt EXAM: CT HEAD WITHOUT CONTRAST CT MAXILLOFACIAL WITHOUT CONTRAST CT CERVICAL SPINE WITHOUT CONTRAST TECHNIQUE: Multidetector CT imaging of the head, cervical spine, and maxillofacial structures were performed using the standard protocol without intravenous contrast. Multiplanar CT image reconstructions of the cervical spine and maxillofacial structures were also generated. RADIATION DOSE REDUCTION: This  exam was performed according to the departmental dose-optimization program which includes  automated exposure control, adjustment of the mA and/or kV according to patient size and/or use of iterative reconstruction technique. COMPARISON:  None Available. FINDINGS: CT HEAD FINDINGS Brain: No evidence of large-territorial acute infarction. No parenchymal hemorrhage. No mass lesion. No extra-axial collection. No mass effect or midline shift. No hydrocephalus. Basilar cisterns are patent. Vascular: No hyperdense vessel. Skull: No acute fracture or focal lesion. Other: None. CT MAXILLOFACIAL FINDINGS Osseous: No fracture or mandibular dislocation. No destructive process. Slightly limited evaluation of the nasal bones due to motion artifact. Sinuses/Orbits: Paranasal sinuses and mastoid air cells are clear. The orbits are unremarkable. Soft tissues: Negative. CT CERVICAL SPINE FINDINGS Alignment: Grade 1 anterolisthesis of C7 on T1. Skull base and vertebrae: Multilevel degenerative changes with severe degenerative changes of the C5-C7 levels. Associated severe bilateral C6-C7 osseous neural foramina. No severe osseous central canal stenosis. No acute fracture. No aggressive appearing focal osseous lesion or focal pathologic process. Soft tissues and spinal canal: No prevertebral fluid or swelling. No visible canal hematoma. Upper chest: Unremarkable. Other: None. IMPRESSION: 1.  No acute intracranial abnormality. 2. No acute displaced facial fracture. Slightly limited evaluation of the nasal bones due to motion artifact. 3. No acute displaced fracture or traumatic listhesis of the cervical spine. Electronically Signed   By: Tish Frederickson M.D.   On: 09/08/2023 22:00   CT Cervical Spine Wo Contrast Result Date: 09/08/2023 CLINICAL DATA:  Facial trauma, blunt; Polytrauma, blunt EXAM: CT HEAD WITHOUT CONTRAST CT MAXILLOFACIAL WITHOUT CONTRAST CT CERVICAL SPINE WITHOUT CONTRAST TECHNIQUE: Multidetector CT imaging of the head, cervical spine, and maxillofacial structures were performed using the standard  protocol without intravenous contrast. Multiplanar CT image reconstructions of the cervical spine and maxillofacial structures were also generated. RADIATION DOSE REDUCTION: This exam was performed according to the departmental dose-optimization program which includes automated exposure control, adjustment of the mA and/or kV according to patient size and/or use of iterative reconstruction technique. COMPARISON:  None Available. FINDINGS: CT HEAD FINDINGS Brain: No evidence of large-territorial acute infarction. No parenchymal hemorrhage. No mass lesion. No extra-axial collection. No mass effect or midline shift. No hydrocephalus. Basilar cisterns are patent. Vascular: No hyperdense vessel. Skull: No acute fracture or focal lesion. Other: None. CT MAXILLOFACIAL FINDINGS Osseous: No fracture or mandibular dislocation. No destructive process. Slightly limited evaluation of the nasal bones due to motion artifact. Sinuses/Orbits: Paranasal sinuses and mastoid air cells are clear. The orbits are unremarkable. Soft tissues: Negative. CT CERVICAL SPINE FINDINGS Alignment: Grade 1 anterolisthesis of C7 on T1. Skull base and vertebrae: Multilevel degenerative changes with severe degenerative changes of the C5-C7 levels. Associated severe bilateral C6-C7 osseous neural foramina. No severe osseous central canal stenosis. No acute fracture. No aggressive appearing focal osseous lesion or focal pathologic process. Soft tissues and spinal canal: No prevertebral fluid or swelling. No visible canal hematoma. Upper chest: Unremarkable. Other: None. IMPRESSION: 1.  No acute intracranial abnormality. 2. No acute displaced facial fracture. Slightly limited evaluation of the nasal bones due to motion artifact. 3. No acute displaced fracture or traumatic listhesis of the cervical spine. Electronically Signed   By: Tish Frederickson M.D.   On: 09/08/2023 22:00   CT Maxillofacial Wo Contrast Result Date: 09/08/2023 CLINICAL DATA:  Facial  trauma, blunt; Polytrauma, blunt EXAM: CT HEAD WITHOUT CONTRAST CT MAXILLOFACIAL WITHOUT CONTRAST CT CERVICAL SPINE WITHOUT CONTRAST TECHNIQUE: Multidetector CT imaging of the head, cervical spine, and maxillofacial structures were performed using  the standard protocol without intravenous contrast. Multiplanar CT image reconstructions of the cervical spine and maxillofacial structures were also generated. RADIATION DOSE REDUCTION: This exam was performed according to the departmental dose-optimization program which includes automated exposure control, adjustment of the mA and/or kV according to patient size and/or use of iterative reconstruction technique. COMPARISON:  None Available. FINDINGS: CT HEAD FINDINGS Brain: No evidence of large-territorial acute infarction. No parenchymal hemorrhage. No mass lesion. No extra-axial collection. No mass effect or midline shift. No hydrocephalus. Basilar cisterns are patent. Vascular: No hyperdense vessel. Skull: No acute fracture or focal lesion. Other: None. CT MAXILLOFACIAL FINDINGS Osseous: No fracture or mandibular dislocation. No destructive process. Slightly limited evaluation of the nasal bones due to motion artifact. Sinuses/Orbits: Paranasal sinuses and mastoid air cells are clear. The orbits are unremarkable. Soft tissues: Negative. CT CERVICAL SPINE FINDINGS Alignment: Grade 1 anterolisthesis of C7 on T1. Skull base and vertebrae: Multilevel degenerative changes with severe degenerative changes of the C5-C7 levels. Associated severe bilateral C6-C7 osseous neural foramina. No severe osseous central canal stenosis. No acute fracture. No aggressive appearing focal osseous lesion or focal pathologic process. Soft tissues and spinal canal: No prevertebral fluid or swelling. No visible canal hematoma. Upper chest: Unremarkable. Other: None. IMPRESSION: 1.  No acute intracranial abnormality. 2. No acute displaced facial fracture. Slightly limited evaluation of the nasal  bones due to motion artifact. 3. No acute displaced fracture or traumatic listhesis of the cervical spine. Electronically Signed   By: Tish Frederickson M.D.   On: 09/08/2023 22:00    Procedures .Laceration Repair  Date/Time: 09/09/2023 4:06 AM  Performed by: Dorthy Cooler, PA-C Authorized by: Dorthy Cooler, PA-C   Consent:    Consent obtained:  Verbal   Consent given by:  Patient   Risks, benefits, and alternatives were discussed: yes     Risks discussed:  Infection, need for additional repair, nerve damage, poor cosmetic result, pain, poor wound healing, retained foreign body, tendon damage and vascular damage   Alternatives discussed:  No treatment Universal protocol:    Procedure explained and questions answered to patient or proxy's satisfaction: yes   Anesthesia:    Anesthesia method:  Local infiltration   Local anesthetic:  Lidocaine 2% WITH epi Laceration details:    Location:  Lip   Lip location:  Upper exterior lip and lower exterior lip   Length (cm):  3 Exploration:    Hemostasis achieved with:  Direct pressure   Contaminated: no   Treatment:    Area cleansed with:  Povidone-iodine   Amount of cleaning:  Standard   Irrigation solution:  Sterile water   Irrigation volume:    Irrigation method:  Pressure wash Skin repair:    Repair method:  Sutures   Suture size:  6-0   Suture material:  Fast-absorbing gut   Suture technique:  Simple interrupted   Number of sutures:  4 Approximation:    Approximation:  Close   Vermilion border well-aligned: yes   Repair type:    Repair type:  Simple Post-procedure details:    Dressing:  Non-adherent dressing   Procedure completion:  Tolerated well, no immediate complications .Laceration Repair  Date/Time: 09/09/2023 4:45 AM  Performed by: Dorthy Cooler, PA-C Authorized by: Dorthy Cooler, PA-C   Consent:    Consent obtained:  Verbal   Consent given by:  Patient   Risks, benefits, and  alternatives were discussed: yes     Risks discussed:  Infection, need  for additional repair, nerve damage, poor wound healing, poor cosmetic result, pain, retained foreign body, tendon damage and vascular damage   Alternatives discussed:  No treatment Universal protocol:    Procedure explained and questions answered to patient or proxy's satisfaction: yes     Patient identity confirmed:  Verbally with patient Anesthesia:    Anesthesia method:  Local infiltration   Local anesthetic:  Lidocaine 2% WITH epi Laceration details:    Location:  Hand   Hand location:  L palm   Length (cm):  2 Exploration:    Imaging obtained: x-ray   Treatment:    Area cleansed with:  Povidone-iodine   Amount of cleaning:  Extensive   Irrigation solution:  Sterile water   Irrigation volume:    Irrigation method:  Pressure wash Skin repair:    Repair method:  Sutures   Suture size:  5-0   Suture material:  Prolene   Suture technique:  Simple interrupted   Number of sutures:  4 Approximation:    Approximation:  Close Repair type:    Repair type:  Simple Post-procedure details:    Dressing:  Non-adherent dressing and antibiotic ointment   Procedure completion:  Tolerated well, no immediate complications Comments:     Initial xray showing foreign bodies. I provided copious irrigation to wound and repeated xray which no longer showed signs of foreign bodies.       Medications Ordered in ED Medications  folic acid (FOLVITE) tablet 1 mg (1 mg Oral Given 09/09/23 0147)  multivitamin with minerals tablet 1 tablet (1 tablet Oral Given 09/09/23 0147)  thiamine (VITAMIN B1) tablet 100 mg (100 mg Oral Given 09/09/23 0147)    Or  thiamine (VITAMIN B1) injection 100 mg ( Intravenous See Alternative 09/09/23 0147)  lidocaine-EPINEPHrine (XYLOCAINE W/EPI) 2 %-1:200000 (PF) injection 20 mL (20 mLs Infiltration Given by Other 09/08/23 2249)  morphine (PF) 2 MG/ML injection 2 mg (2 mg Intravenous Given 09/09/23  0002)  Tdap (BOOSTRIX) injection 0.5 mL (0.5 mLs Intramuscular Given 09/09/23 0145)  bacitracin ointment (1 Application Topical Given 09/09/23 0157)  oxyCODONE (Oxy IR/ROXICODONE) immediate release tablet 5 mg (5 mg Oral Given 09/09/23 0457)    ED Course/ Medical Decision Making/ A&P                                 Medical Decision Making Amount and/or Complexity of Data Reviewed Labs: ordered. Radiology: ordered.  Risk OTC drugs. Prescription drug management.   This patient presents to the ED after a fall, this involves an extensive number of treatment options, and is a complaint that carries with it a high risk of complications and morbidity.  The differential diagnosis includes  intracranial hemorrhage, subdural/epidural hematoma, vertebral fracture, spinal cord injury, muscle strain, skull fracture, fracture.   Co morbidities that complicate the patient evaluation  anxiety, alcoholic cirrhosis, depression, DM, GERD, HLD, HTN   Additional history obtained:  Dr. Clent Ridges PCP   Problem List / ED Course / Critical interventions / Medication management  Patient presented for Fall. Patient does not remember incident. Patient with ETOH onboard but is awake and very talkative. Patient with stable vitals. Laceration on left side of lip and on left hand. No other lacerations are appreciated. Patient unsure of last tetanus dose. Tetanus shot was provided in ED today. Patient complaining of pain of face, left hand, left hip, left knee. I Ordered, and personally interpreted labs.  CBC with anemia near patient's baseline - Hgb 10.0.  No leukocytosis.  UDS negative.  BMP with elevated BUN/CR near patient's baseline - currently 23/1.74. CBG 102. UA not concerning for infection. ETOH 388. The patient was maintained on a cardiac monitor.  I personally viewed and interpreted the cardiac monitored which showed an underlying rhythm of: sinus rhythm I ordered imaging studies including left hand/knee/hip  xray and CT head/cervical spine/maxillofacial . I independently visualized and interpreted imaging which showed no acute process. Initial hand xray showing foreign bodies which was resolved after copious irrigation. I agree with the radiologist interpretation.  It appears that patient's fall was d/t ETOH. Patient complaining of left hand pain associated with the laceration - but otherwise feels fine and states that he is ready to go home. Patient metabolized in ED for 8 hours. Patient clinically sober, oriented, and walking with steady gait. Tolerating PO intake. Patient stating that he is not able to take Advil or Tylenol d/t kidney and liver problems and is requesting narcotic pain control for his lacerations. I provided patient with a couple of doses of oxy 5mg . Patient came in by EMS and will be Ubering home. Patient understands to follow up with PCP or UC for suture removal. Shared all results. Answered all of patient's questions. Staffed with Dr. Preston Fleeting who agrees with plan. I have reviewed the patients home medicines and have made adjustments as needed Patient was given return precautions. Patient stable for discharge at this time. Patient verbalized understanding of plan.   Social Determinants of Health:  none           Final Clinical Impression(s) / ED Diagnoses Final diagnoses:  Fall, initial encounter  Face lacerations, initial encounter  Laceration of left hand without foreign body, initial encounter    Rx / DC Orders ED Discharge Orders          Ordered    oxyCODONE (ROXICODONE) 5 MG immediate release tablet  Every 4 hours PRN        09/09/23 0453              Dorthy Cooler, PA-C 09/09/23 0509    Dione Booze, MD 09/09/23 [phone]

## 2023-09-13 ENCOUNTER — Other Ambulatory Visit: Payer: Self-pay | Admitting: Family Medicine

## 2023-10-14 ENCOUNTER — Encounter: Payer: Self-pay | Admitting: Family Medicine

## 2023-10-14 ENCOUNTER — Telehealth: Payer: Self-pay | Admitting: *Deleted

## 2023-10-14 NOTE — Telephone Encounter (Signed)
 Copied from CRM [id number]. Topic: Appointments - Scheduling Inquiry for Clinic >> Oct 14, 2023  1:04 PM [name] wrote: Reason for CRM: THE PATIENT CALLED TO GET SCHEDULED WITH DR Alyne Babinski DUE TO pancreatitis is flare, offered him to see someone tomorrow, but he stated he can't wait that long, he will go to urgent care.

## 2023-10-14 NOTE — Telephone Encounter (Signed)
He needs an OV for this  

## 2023-10-14 NOTE — Telephone Encounter (Signed)
 Pt was prescribed Rx at the hospital, please advise if pt needs appointment for this

## 2023-10-15 NOTE — Telephone Encounter (Signed)
 FYI. Noted

## 2023-12-25 ENCOUNTER — Other Ambulatory Visit: Payer: Self-pay

## 2023-12-25 ENCOUNTER — Emergency Department (HOSPITAL_COMMUNITY): Payer: Self-pay

## 2023-12-25 ENCOUNTER — Encounter (HOSPITAL_COMMUNITY): Payer: Self-pay | Admitting: Emergency Medicine

## 2023-12-25 ENCOUNTER — Emergency Department (HOSPITAL_COMMUNITY)
Admission: EM | Admit: 2023-12-25 | Discharge: 2023-12-26 | Disposition: A | Discharge status: Home / Self Care | Payer: Self-pay | Attending: Emergency Medicine | Admitting: Emergency Medicine

## 2023-12-25 DIAGNOSIS — E1140 Type 2 diabetes mellitus with diabetic neuropathy, unspecified: Secondary | ICD-10-CM | POA: Insufficient documentation

## 2023-12-25 DIAGNOSIS — S0011XA Contusion of right eyelid and periocular area, initial encounter: Secondary | ICD-10-CM | POA: Insufficient documentation

## 2023-12-25 DIAGNOSIS — S42031A Displaced fracture of lateral end of right clavicle, initial encounter for closed fracture: Secondary | ICD-10-CM | POA: Insufficient documentation

## 2023-12-25 DIAGNOSIS — W19XXXA Unspecified fall, initial encounter: Secondary | ICD-10-CM | POA: Insufficient documentation

## 2023-12-25 DIAGNOSIS — Z7984 Long term (current) use of oral hypoglycemic drugs: Secondary | ICD-10-CM | POA: Insufficient documentation

## 2023-12-25 DIAGNOSIS — S0990XA Unspecified injury of head, initial encounter: Secondary | ICD-10-CM | POA: Insufficient documentation

## 2023-12-25 DIAGNOSIS — Z79899 Other long term (current) drug therapy: Secondary | ICD-10-CM | POA: Insufficient documentation

## 2023-12-25 DIAGNOSIS — Z794 Long term (current) use of insulin: Secondary | ICD-10-CM | POA: Insufficient documentation

## 2023-12-25 DIAGNOSIS — I10 Essential (primary) hypertension: Secondary | ICD-10-CM | POA: Insufficient documentation

## 2023-12-25 NOTE — ED Notes (Addendum)
 This CN contacted [facility] ([facility] for verification of service animal policy. I was advised of the appropriate questions to ask the pt:  1) Is this service animal required because of a disability? Answer yes, when asked which disability pt answers pick one.  2) What work or task has the animal been trained to perform? Answer: I dont know, he's just my buddy   Noted that service animal is disheveled and has no identifiable vest or other confirmation of service animal status. Pt asked repeatedly if we can call someone to come and get the dog so it can be cared for while he is here in the ED. Pt responded with If he leaves, I leave. Who the fuck do you think you are?SABRA Reminded pt that he was found unconscious in the parking lot and that he was unable to care for the dog at this time due to being highly intoxicated and injured. Pt refuses to allow us  to call someone to care for his dog. Dr Trine and [employer] off duty at bedside to assist with situation as we do not have the resources to care for any animal in the ED and pt cannot care for him.

## 2023-12-25 NOTE — ED Provider Notes (Incomplete)
 [place] EMERGENCY DEPARTMENT AT [facility] Provider Note  CSN: [id number] Arrival date & time: 12/25/23 2313  Chief Complaint(s) Fall  HPI Martin Anthony is a 58 y.o. male {Add pertinent medical, surgical, social history, OB history to HPI:1}    Fall    Past Medical History Past Medical History:  Diagnosis Date  . Anxiety   . Ascites due to alcoholic cirrhosis (HCC)   . Cirrhosis with alcoholism (HCC)   . Depression   . Diabetes mellitus without complication (HCC)   . GERD (gastroesophageal reflux disease)   . Gout   . Hyperlipidemia   . Hypertension   . Pancreatitis   . Primary adenocarcinoma of ascending colon (HCC)   . Substance abuse (HCC)    alcoholism   . Type 2 diabetes mellitus with diabetic neuropathy [facility])    Patient Active Problem List   Diagnosis Date Noted  . Acute pancreatitis 06/06/2023  . GIB (gastrointestinal bleeding) 05/21/2023  . Duodenitis with bleeding 05/20/2023  . Malnutrition of moderate degree 05/12/2023  . PAF (paroxysmal atrial fibrillation) (HCC) 05/07/2023  . Abdominal pain in male 05/07/2023  . Protein calorie malnutrition (HCC) 05/07/2023  . Abdominal pain 04/05/2023  . Liver mass 03/31/2023  . Hyponatremia 03/31/2023  . Streptococcal bacteremia 03/30/2023  . Alcohol  withdrawal delirium (HCC) 03/30/2023  . Thrombocytopenia (HCC) 03/30/2023  . Shock (HCC) 03/30/2023  . Esophageal varices with bleeding (HCC) 03/23/2023  . 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  . Alcoholic cirrhosis of liver with ascites (HCC) 12/05/2021  . Type 2 diabetes mellitus with neurological complications (HCC) 12/05/2021  . Primary adenocarcinoma of ascending colon (HCC) 12/05/2021  . CKD (chronic kidney disease) stage 3, GFR 30-59 ml/min (HCC) 12/05/2021  . Pancytopenia (HCC) 12/05/2021  . Right inguinal hernia 12/05/2021  . Acute renal failure superimposed on stage 2 chronic kidney disease (HCC) 05/12/2021  . Rhabdomyolysis  05/12/2021  . Elevated LFTs 05/12/2021  . Acute encephalopathy 05/12/2021  . Ascites   . Anemia 01/18/2019  . Alcohol  abuse 06/25/2015  . Gout 10/03/2013  . HYPERLIPIDEMIA 05/20/2007  . Depression with anxiety 05/20/2007  . Essential hypertension 05/20/2007  . GERD 05/20/2007   Home Medication(s) Prior to Admission medications   Medication Sig Start Date End Date Taking? Authorizing Provider  carvedilol  (COREG ) 3.125 MG tablet Take 3.125 mg by mouth 2 (two) times daily with a meal.    [provider]  folic acid  (FOLVITE ) 1 MG tablet Take 1 tablet (1 mg total) by mouth daily. 05/21/21   Singh, Prashant K, MD  gabapentin  (NEURONTIN ) 100 MG capsule Take 1 capsule (100 mg total) by mouth 2 (two) times daily. 07/09/23   Johnny Garnette LABOR, MD  gabapentin  (NEURONTIN ) 300 MG capsule Take 1 capsule (300 mg total) by mouth at bedtime. 07/09/23   Johnny Garnette LABOR, MD  insulin  glargine (LANTUS ) 100 UNIT/ML Solostar Pen Inject 10 Units into the skin at bedtime. Patient taking differently: Inject 6 Units into the skin at bedtime. 06/12/23   Pokhrel, Laxman, MD  Insulin  Pen Needle 32G X 8 MM MISC Use as directed 06/12/23   Pokhrel, Laxman, MD  Iron , Ferrous Sulfate , 325 (65 Fe) MG TABS Take 325 mg by mouth daily. 12/18/21   Johnny Garnette LABOR, MD  metformin  (FORTAMET ) 500 MG (OSM) 24 hr tablet Take 1 tablet (500 mg total) by mouth daily with breakfast. 07/09/23   Johnny Garnette LABOR, MD  midodrine  (PROAMATINE ) 5 MG tablet Take 1 tablet (5 mg total) by mouth 3 (  three) times daily with meals. 07/09/23   Johnny Garnette LABOR, MD  nicotine  (NICODERM CQ  - DOSED IN MG/24 HOURS) 14 mg/24hr patch Place 1 patch (14 mg total) onto the skin daily. 07/09/23   Johnny Garnette LABOR, MD  ondansetron  (ZOFRAN ) 4 MG tablet Take 1 tablet (4 mg total) by mouth every 6 (six) hours as needed for nausea. Patient not taking: Reported on 09/08/2023 06/12/23   Sonjia Held, MD  oxyCODONE  (ROXICODONE ) 5 MG immediate release tablet Take 1 tablet (5 mg  total) by mouth every 4 (four) hours as needed for up to 3 doses for severe pain (pain score 7-10). 09/09/23   Hoy Nidia FALCON, PA-C  pantoprazole  (PROTONIX ) 40 MG tablet Take 1 tablet (40 mg total) by mouth daily. 06/11/23   Christobal Guadalajara, MD  rifaximin  (XIFAXAN ) 550 MG TABS tablet Take 1 tablet (550 mg total) by mouth 2 (two) times daily. 07/09/23   Johnny Garnette LABOR, MD  sertraline  (ZOLOFT ) 50 MG tablet Take 1 tablet (50 mg total) by mouth daily. 07/09/23   Johnny Garnette LABOR, MD  spironolactone  (ALDACTONE ) 100 MG tablet Take 1 tablet (100 mg total) by mouth daily. 07/09/23   Johnny Garnette LABOR, MD  sucralfate  (CARAFATE ) 1 g tablet Take 1 tablet (1 g total) by mouth 4 (four) times daily -  with meals and at bedtime. 07/09/23   Johnny Garnette LABOR, MD  thiamine  100 MG tablet Take 1 tablet (100 mg total) by mouth daily. 05/21/21   Dennise Lavada POUR, MD                                                                                                                                    Allergies Acetaminophen  and Ibuprofen  Review of Systems Review of Systems As noted in HPI  Physical Exam Vital Signs  I have reviewed the triage vital signs BP (!) 143/77   Pulse (!) 53   SpO2 97%  *** Physical Exam  ED Results and Treatments Labs (all labs ordered are listed, but only abnormal results are displayed) Labs Reviewed - No data to display                                                                                                                       EKG  EKG Interpretation Date/Time:    Ventricular Rate:    PR Interval:    QRS Duration:  QT Interval:    QTC Calculation:   R Axis:      Text Interpretation:         Radiology No results found.  Medications Ordered in ED Medications - No data to display Procedures Procedures  (including critical care time) Medical Decision Making / ED Course   Medical Decision Making Amount and/or Complexity of Data Reviewed Radiology: ordered.     ***    Final Clinical Impression(s) / ED Diagnoses Final diagnoses:  None    This chart was dictated using voice recognition software.  Despite best efforts to proofread,  errors can occur which can change the documentation meaning.

## 2023-12-25 NOTE — ED Provider Notes (Incomplete)
 [place] EMERGENCY DEPARTMENT AT [facility] Provider Note  CSN: [id number] Arrival date & time: 12/25/23 2313  Chief Complaint(s) Fall  HPI Martin Anthony is a 58 y.o. male {Add pertinent medical, surgical, social history, OB history to HPI:1}    Fall    Past Medical History Past Medical History:  Diagnosis Date  . Anxiety   . Ascites due to alcoholic cirrhosis (HCC)   . Cirrhosis with alcoholism (HCC)   . Depression   . Diabetes mellitus without complication (HCC)   . GERD (gastroesophageal reflux disease)   . Gout   . Hyperlipidemia   . Hypertension   . Pancreatitis   . Primary adenocarcinoma of ascending colon (HCC)   . Substance abuse (HCC)    alcoholism   . Type 2 diabetes mellitus with diabetic neuropathy [facility])    Patient Active Problem List   Diagnosis Date Noted  . Acute pancreatitis 06/06/2023  . GIB (gastrointestinal bleeding) 05/21/2023  . Duodenitis with bleeding 05/20/2023  . Malnutrition of moderate degree 05/12/2023  . PAF (paroxysmal atrial fibrillation) (HCC) 05/07/2023  . Abdominal pain in male 05/07/2023  . Protein calorie malnutrition (HCC) 05/07/2023  . Abdominal pain 04/05/2023  . Liver mass 03/31/2023  . Hyponatremia 03/31/2023  . Streptococcal bacteremia 03/30/2023  . Alcohol  withdrawal delirium (HCC) 03/30/2023  . Thrombocytopenia (HCC) 03/30/2023  . Shock (HCC) 03/30/2023  . Esophageal varices with bleeding (HCC) 03/23/2023  . 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  . Alcoholic cirrhosis of liver with ascites (HCC) 12/05/2021  . Type 2 diabetes mellitus with neurological complications (HCC) 12/05/2021  . Primary adenocarcinoma of ascending colon (HCC) 12/05/2021  . CKD (chronic kidney disease) stage 3, GFR 30-59 ml/min (HCC) 12/05/2021  . Pancytopenia (HCC) 12/05/2021  . Right inguinal hernia 12/05/2021  . Acute renal failure superimposed on stage 2 chronic kidney disease (HCC) 05/12/2021  . Rhabdomyolysis  05/12/2021  . Elevated LFTs 05/12/2021  . Acute encephalopathy 05/12/2021  . Ascites   . Anemia 01/18/2019  . Alcohol  abuse 06/25/2015  . Gout 10/03/2013  . HYPERLIPIDEMIA 05/20/2007  . Depression with anxiety 05/20/2007  . Essential hypertension 05/20/2007  . GERD 05/20/2007   Home Medication(s) Prior to Admission medications   Medication Sig Start Date End Date Taking? Authorizing Provider  carvedilol  (COREG ) 3.125 MG tablet Take 3.125 mg by mouth 2 (two) times daily with a meal.    [provider]  folic acid  (FOLVITE ) 1 MG tablet Take 1 tablet (1 mg total) by mouth daily. 05/21/21   Singh, Prashant K, MD  gabapentin  (NEURONTIN ) 100 MG capsule Take 1 capsule (100 mg total) by mouth 2 (two) times daily. 07/09/23   Johnny Garnette LABOR, MD  gabapentin  (NEURONTIN ) 300 MG capsule Take 1 capsule (300 mg total) by mouth at bedtime. 07/09/23   Johnny Garnette LABOR, MD  insulin  glargine (LANTUS ) 100 UNIT/ML Solostar Pen Inject 10 Units into the skin at bedtime. Patient taking differently: Inject 6 Units into the skin at bedtime. 06/12/23   Pokhrel, Vernal, MD  Insulin  Pen Needle 32G X 8 MM MISC Use as directed 06/12/23   Pokhrel, Laxman, MD  Iron , Ferrous Sulfate , 325 (65 Fe) MG TABS Take 325 mg by mouth daily. 12/18/21   Johnny Garnette LABOR, MD  metformin  (FORTAMET ) 500 MG (OSM) 24 hr tablet Take 1 tablet (500 mg total) by mouth daily with breakfast. 07/09/23   Johnny Garnette LABOR, MD  midodrine  (PROAMATINE ) 5 MG tablet Take 1 tablet (5 mg total) by mouth 3 (  three) times daily with meals. 07/09/23   Johnny Garnette LABOR, MD  nicotine  (NICODERM CQ  - DOSED IN MG/24 HOURS) 14 mg/24hr patch Place 1 patch (14 mg total) onto the skin daily. 07/09/23   Johnny Garnette LABOR, MD  ondansetron  (ZOFRAN ) 4 MG tablet Take 1 tablet (4 mg total) by mouth every 6 (six) hours as needed for nausea. Patient not taking: Reported on 09/08/2023 06/12/23   Sonjia Held, MD  oxyCODONE  (ROXICODONE ) 5 MG immediate release tablet Take 1 tablet (5 mg  total) by mouth every 4 (four) hours as needed for up to 3 doses for severe pain (pain score 7-10). 09/09/23   Hoy Nidia FALCON, PA-C  pantoprazole  (PROTONIX ) 40 MG tablet Take 1 tablet (40 mg total) by mouth daily. 06/11/23   Christobal Guadalajara, MD  rifaximin  (XIFAXAN ) 550 MG TABS tablet Take 1 tablet (550 mg total) by mouth 2 (two) times daily. 07/09/23   Johnny Garnette LABOR, MD  sertraline  (ZOLOFT ) 50 MG tablet Take 1 tablet (50 mg total) by mouth daily. 07/09/23   Johnny Garnette LABOR, MD  spironolactone  (ALDACTONE ) 100 MG tablet Take 1 tablet (100 mg total) by mouth daily. 07/09/23   Johnny Garnette LABOR, MD  sucralfate  (CARAFATE ) 1 g tablet Take 1 tablet (1 g total) by mouth 4 (four) times daily -  with meals and at bedtime. 07/09/23   Johnny Garnette LABOR, MD  thiamine  100 MG tablet Take 1 tablet (100 mg total) by mouth daily. 05/21/21   Dennise Lavada POUR, MD                                                                                                                                    Allergies Acetaminophen  and Ibuprofen  Review of Systems Review of Systems As noted in HPI  Physical Exam Vital Signs  I have reviewed the triage vital signs BP (!) 143/77   Pulse (!) 53   SpO2 97%  *** Physical Exam  ED Results and Treatments Labs (all labs ordered are listed, but only abnormal results are displayed) Labs Reviewed - No data to display                                                                                                                       EKG  EKG Interpretation Date/Time:    Ventricular Rate:    PR Interval:    QRS Duration:  QT Interval:    QTC Calculation:   R Axis:      Text Interpretation:         Radiology No results found.  Medications Ordered in ED Medications - No data to display Procedures Procedures  (including critical care time) Medical Decision Making / ED Course   Medical Decision Making Amount and/or Complexity of Data Reviewed Radiology: ordered.     ***    Final Clinical Impression(s) / ED Diagnoses Final diagnoses:  None    This chart was dictated using voice recognition software.  Despite best efforts to proofread,  errors can occur which can change the documentation meaning.

## 2023-12-25 NOTE — ED Triage Notes (Signed)
 Security called to parking lot to assist this patient who was found lying on the ground screaming with his car stopped in the lot.  Pt is accompanied by what he states is a service dog on a leash.  Pt states he fell and then doesn't remember what happened.  Smells of ETOH.  Reports right shoulder pain and possible dislocation  Pt reports hx of encephalopathy, diabetes, neuropathy, and cirrhosis.

## 2023-12-26 ENCOUNTER — Other Ambulatory Visit: Payer: Self-pay

## 2023-12-26 ENCOUNTER — Emergency Department (HOSPITAL_COMMUNITY)
Admission: EM | Admit: 2023-12-26 | Discharge: 2023-12-26 | Disposition: A | Discharge status: Home / Self Care | Payer: MEDICAID | Attending: Emergency Medicine | Admitting: Emergency Medicine

## 2023-12-26 ENCOUNTER — Emergency Department (HOSPITAL_COMMUNITY): Payer: Self-pay

## 2023-12-26 ENCOUNTER — Encounter (HOSPITAL_COMMUNITY): Payer: Self-pay | Admitting: Emergency Medicine

## 2023-12-26 DIAGNOSIS — F1010 Alcohol abuse, uncomplicated: Secondary | ICD-10-CM | POA: Insufficient documentation

## 2023-12-26 DIAGNOSIS — E1122 Type 2 diabetes mellitus with diabetic chronic kidney disease: Secondary | ICD-10-CM | POA: Insufficient documentation

## 2023-12-26 DIAGNOSIS — Z79899 Other long term (current) drug therapy: Secondary | ICD-10-CM | POA: Insufficient documentation

## 2023-12-26 DIAGNOSIS — I129 Hypertensive chronic kidney disease with stage 1 through stage 4 chronic kidney disease, or unspecified chronic kidney disease: Secondary | ICD-10-CM | POA: Insufficient documentation

## 2023-12-26 DIAGNOSIS — M25511 Pain in right shoulder: Secondary | ICD-10-CM | POA: Insufficient documentation

## 2023-12-26 DIAGNOSIS — Z87891 Personal history of nicotine dependence: Secondary | ICD-10-CM | POA: Insufficient documentation

## 2023-12-26 DIAGNOSIS — Z7984 Long term (current) use of oral hypoglycemic drugs: Secondary | ICD-10-CM | POA: Insufficient documentation

## 2023-12-26 DIAGNOSIS — N183 Chronic kidney disease, stage 3 unspecified: Secondary | ICD-10-CM | POA: Insufficient documentation

## 2023-12-26 DIAGNOSIS — Z85038 Personal history of other malignant neoplasm of large intestine: Secondary | ICD-10-CM | POA: Insufficient documentation

## 2023-12-26 DIAGNOSIS — Z794 Long term (current) use of insulin: Secondary | ICD-10-CM | POA: Insufficient documentation

## 2023-12-26 LAB — I-STAT CHEM 8, ED
BUN: 25 mg/dL — ABNORMAL HIGH (ref 6–20)
Calcium, Ion: 1.12 mmol/L — ABNORMAL LOW (ref 1.15–1.40)
Chloride: 115 mmol/L — ABNORMAL HIGH (ref 98–111)
Creatinine, Ser: 2.5 mg/dL — ABNORMAL HIGH (ref 0.61–1.24)
Glucose, Bld: 132 mg/dL — ABNORMAL HIGH (ref 70–99)
HCT: 29 % — ABNORMAL LOW (ref 39.0–52.0)
Hemoglobin: 9.9 g/dL — ABNORMAL LOW (ref 13.0–17.0)
Potassium: 3.7 mmol/L (ref 3.5–5.1)
Sodium: 146 mmol/L — ABNORMAL HIGH (ref 135–145)
TCO2: 18 mmol/L — ABNORMAL LOW (ref 22–32)

## 2023-12-26 MED ORDER — CYCLOBENZAPRINE HCL 10 MG PO TABS
5.0000 mg | ORAL_TABLET | Freq: Once | ORAL | Status: AC
  Administered 2023-12-26: 5 mg via ORAL
  Filled 2023-12-26: qty 1

## 2023-12-26 MED ORDER — OXYCODONE HCL 5 MG PO TABS: 2.5000 mg | ORAL_TABLET | Freq: Four times a day (QID) | ORAL | 0 refills | Status: AC | PRN

## 2023-12-26 MED ORDER — LIDOCAINE 5 % EX PTCH
1.0000 | MEDICATED_PATCH | Freq: Once | CUTANEOUS | Status: DC
  Administered 2023-12-26: 1 via TRANSDERMAL
  Filled 2023-12-26: qty 1

## 2023-12-26 MED ORDER — LIDOCAINE 5 % EX PTCH
1.0000 | MEDICATED_PATCH | Freq: Every day | CUTANEOUS | 0 refills | Status: AC | PRN
Start: 2023-12-26 — End: ?

## 2023-12-26 MED ORDER — CYCLOBENZAPRINE HCL 10 MG PO TABS: 10.0000 mg | ORAL_TABLET | Freq: Two times a day (BID) | ORAL | 0 refills | Status: AC | PRN

## 2023-12-26 NOTE — ED Provider Notes (Signed)
 [place] [facility] AT [facility] Provider Note  CSN: [id number] Arrival date & time: 12/26/23 9354  Chief Complaint(s) Shoulder Pain  HPI Martin Anthony is a 58 y.o. male with past medical history as below, significant for alcohol  abuse, alcoholic cirrhosis, hyperlipidemia, hypertension, colon cancer with ostomy who presents to the ED with complaint of right shoulder pain  Patient was seen last night, he had a fall and was intoxicated.  Found to have fractured his right clavicle.  Patient was discharged in stable condition.  When he woke up this morning in his car his memory was somewhat unclear about his visit last night and he presented back to the ER for evaluation.  Reports his fall last night was secondary to tripping over his dog.  Past Medical History Past Medical History:  Diagnosis Date   Anxiety    Ascites due to alcoholic cirrhosis (HCC)    Cirrhosis with alcoholism (HCC)    Depression    Diabetes mellitus without complication (HCC)    GERD (gastroesophageal reflux disease)    Gout    Hyperlipidemia    Hypertension    Pancreatitis    Primary adenocarcinoma of ascending colon (HCC)    Substance abuse (HCC)    alcoholism    Type 2 diabetes mellitus with diabetic neuropathy (HCC)    Patient Active Problem List   Diagnosis Date Noted   Acute pancreatitis 06/06/2023   GIB (gastrointestinal bleeding) 05/21/2023   Duodenitis with bleeding 05/20/2023   Malnutrition of moderate degree 05/12/2023   PAF (paroxysmal atrial fibrillation) (HCC) 05/07/2023   Abdominal pain in male 05/07/2023   Protein calorie malnutrition (HCC) 05/07/2023   Abdominal pain 04/05/2023   Liver mass 03/31/2023   Hyponatremia 03/31/2023   Streptococcal bacteremia 03/30/2023   Alcohol  withdrawal delirium (HCC) 03/30/2023   Thrombocytopenia (HCC) 03/30/2023   Shock (HCC) 03/30/2023   Esophageal varices with bleeding (HCC) 03/23/2023   Alcoholic peripheral neuropathy (HCC)  02/02/2023   Alcoholic cirrhosis of liver with ascites (HCC) 12/05/2021   Type 2 diabetes mellitus with neurological complications (HCC) 12/05/2021   Primary adenocarcinoma of ascending colon (HCC) 12/05/2021   CKD (chronic kidney disease) stage 3, GFR 30-59 ml/min (HCC) 12/05/2021   Pancytopenia (HCC) 12/05/2021   Right inguinal hernia 12/05/2021   Acute renal failure superimposed on stage 2 chronic kidney disease (HCC) 05/12/2021   Rhabdomyolysis 05/12/2021   Elevated LFTs 05/12/2021   Acute encephalopathy 05/12/2021   Ascites    Anemia 01/18/2019   Alcohol  abuse 06/25/2015   Gout 10/03/2013   HYPERLIPIDEMIA 05/20/2007   Depression with anxiety 05/20/2007   Essential hypertension 05/20/2007   GERD 05/20/2007   Home Medication(s) Prior to Admission medications   Medication Sig Start Date End Date Taking? Authorizing Provider  cyclobenzaprine  (FLEXERIL ) 10 MG tablet Take 1 tablet (10 mg total) by mouth 2 (two) times daily as needed for muscle spasms. 12/26/23  Yes Elnor Savant A, DO  carvedilol  (COREG ) 3.125 MG tablet Take 3.125 mg by mouth 2 (two) times daily with a meal.    [provider]  folic acid  (FOLVITE ) 1 MG tablet Take 1 tablet (1 mg total) by mouth daily. 05/21/21   Singh, Prashant K, MD  gabapentin  (NEURONTIN ) 100 MG capsule Take 1 capsule (100 mg total) by mouth 2 (two) times daily. 07/09/23   Johnny Garnette LABOR, MD  gabapentin  (NEURONTIN ) 300 MG capsule Take 1 capsule (300 mg total) by mouth at bedtime. 07/09/23   Johnny Garnette LABOR, MD  insulin  glargine (LANTUS ) 100 UNIT/ML Solostar Pen Inject 10 Units into the skin at bedtime. Patient taking differently: Inject 6 Units into the skin at bedtime. 06/12/23   Pokhrel, Laxman, MD  Insulin  Pen Needle 32G X 8 MM MISC Use as directed 06/12/23   Pokhrel, Laxman, MD  Iron , Ferrous Sulfate , 325 (65 Fe) MG TABS Take 325 mg by mouth daily. 12/18/21   Johnny Garnette LABOR, MD  metformin  (FORTAMET ) 500 MG (OSM) 24 hr tablet Take 1 tablet (500  mg total) by mouth daily with breakfast. 07/09/23   Johnny Garnette LABOR, MD  midodrine  (PROAMATINE ) 5 MG tablet Take 1 tablet (5 mg total) by mouth 3 (three) times daily with meals. 07/09/23   Johnny Garnette LABOR, MD  nicotine  (NICODERM CQ  - DOSED IN MG/24 HOURS) 14 mg/24hr patch Place 1 patch (14 mg total) onto the skin daily. 07/09/23   Johnny Garnette LABOR, MD  ondansetron  (ZOFRAN ) 4 MG tablet Take 1 tablet (4 mg total) by mouth every 6 (six) hours as needed for nausea. Patient not taking: Reported on 09/08/2023 06/12/23   Sonjia Held, MD  oxyCODONE  (ROXICODONE ) 5 MG immediate release tablet Take 0.5-1 tablets (2.5-5 mg total) by mouth every 6 (six) hours as needed for up to 5 days for severe pain (pain score 7-10). 12/26/23 12/31/23  Cardama, Raynell Moder, MD  pantoprazole  (PROTONIX ) 40 MG tablet Take 1 tablet (40 mg total) by mouth daily. 06/11/23   Christobal Guadalajara, MD  rifaximin  (XIFAXAN ) 550 MG TABS tablet Take 1 tablet (550 mg total) by mouth 2 (two) times daily. 07/09/23   Johnny Garnette LABOR, MD  sertraline  (ZOLOFT ) 50 MG tablet Take 1 tablet (50 mg total) by mouth daily. 07/09/23   Johnny Garnette LABOR, MD  spironolactone  (ALDACTONE ) 100 MG tablet Take 1 tablet (100 mg total) by mouth daily. 07/09/23   Johnny Garnette LABOR, MD  sucralfate  (CARAFATE ) 1 g tablet Take 1 tablet (1 g total) by mouth 4 (four) times daily -  with meals and at bedtime. 07/09/23   Johnny Garnette LABOR, MD  thiamine  100 MG tablet Take 1 tablet (100 mg total) by mouth daily. 05/21/21   Dennise Lavada POUR, MD                                                                                                                                    Past Surgical History Past Surgical History:  Procedure Laterality Date   BIOPSY  01/19/2019   Procedure: BIOPSY;  Surgeon: Rollin Dover, MD;  Location: [facility];  Service: Endoscopy;;   BIOPSY  05/09/2023   Procedure: BIOPSY;  Surgeon: Elicia Claw, MD;  Location: [facility];  Service: Gastroenterology;;   COLON  SURGERY  10/25/2021   right hemicolectomy with ostomy at [facility] in [place]   COLONOSCOPY N/A 01/19/2019   Procedure: COLONOSCOPY;  Surgeon: Rollin Dover, MD;  Location: [facility];  Service: Endoscopy;  Laterality: N/A;  ESOPHAGEAL BANDING  03/25/2023   Procedure: ESOPHAGEAL BANDING;  Surgeon: Saintclair Jasper, MD;  Location: [facility];  Service: Gastroenterology;;   ESOPHAGOGASTRODUODENOSCOPY (EGD) WITH PROPOFOL  N/A 01/19/2019   Procedure: ESOPHAGOGASTRODUODENOSCOPY (EGD) WITH PROPOFOL ;  Surgeon: Rollin Dover, MD;  Location: [facility];  Service: Endoscopy;  Laterality: N/A;   ESOPHAGOGASTRODUODENOSCOPY (EGD) WITH PROPOFOL  N/A 03/25/2023   Procedure: ESOPHAGOGASTRODUODENOSCOPY (EGD) WITH PROPOFOL ;  Surgeon: Saintclair Jasper, MD;  Location: [facility];  Service: Gastroenterology;  Laterality: N/A;   ESOPHAGOGASTRODUODENOSCOPY (EGD) WITH PROPOFOL  N/A 05/09/2023   Procedure: ESOPHAGOGASTRODUODENOSCOPY (EGD) WITH PROPOFOL ;  Surgeon: Elicia Claw, MD;  Location: [facility];  Service: Gastroenterology;  Laterality: N/A;   HEMOSTASIS CLIP PLACEMENT  01/19/2019   Procedure: HEMOSTASIS CLIP PLACEMENT;  Surgeon: Rollin Dover, MD;  Location: [facility];  Service: Endoscopy;;   HERNIA REPAIR     umbilical    ILEOSCOPY N/A 03/25/2023   Procedure: ILEOSCOPY THROUGH STOMA;  Surgeon: Saintclair Jasper, MD;  Location: [facility];  Service: Gastroenterology;  Laterality: N/A;   INGUINAL HERNIA REPAIR Bilateral    IR FLUORO GUIDE CV LINE RIGHT  05/13/2021   IR PARACENTESIS  05/19/2021   IR US  GUIDE VASC ACCESS RIGHT  05/13/2021   POLYPECTOMY  01/19/2019   Procedure: POLYPECTOMY;  Surgeon: Rollin Dover, MD;  Location: [facility];  Service: Endoscopy;;   Family History Family History  Problem Relation Age of Onset   Hypertension Mother    Diabetes Father    Heart attack Father    Pneumonia Father    Hypertension Father    Diabetes Maternal Grandfather     Social History Social  History   Tobacco Use   Smoking status: Former    Current packs/day: 0.00    Types: Cigarettes    Quit date: 08/11/2001    Years since quitting: 22.3   Smokeless tobacco: Never  Substance Use Topics   Alcohol  use: Yes    Alcohol /week: 2.0 standard drinks of alcohol     Types: 2 Standard drinks or equivalent per week   Drug use: No   Allergies Acetaminophen  and Ibuprofen  Review of Systems A thorough review of systems was obtained and all systems are negative except as noted in the HPI and PMH.   Physical Exam Vital Signs  I have reviewed the triage vital signs BP (!) 146/87   Pulse (!) 56   Temp 98 F (36.7 C) (Oral)   Resp 17   Wt 78.6 kg   SpO2 98%   BMI 21.09 kg/m  Physical Exam Vitals and nursing note reviewed.  Constitutional:      General: He is not in acute distress.    Appearance: Normal appearance. He is well-developed. He is not ill-appearing.  HENT:     Head: Normocephalic. No laceration.      Right Ear: External ear normal.     Left Ear: External ear normal.     Nose: Nose normal.     Mouth/Throat:     Mouth: Mucous membranes are moist.  Eyes:     General: No scleral icterus.       Right eye: No discharge.        Left eye: No discharge.  Cardiovascular:     Rate and Rhythm: Normal rate.  Pulmonary:     Effort: Pulmonary effort is normal. No respiratory distress.     Breath sounds: No stridor.  Abdominal:     General: Abdomen is flat. There is no distension.     Tenderness: There is no guarding.  Musculoskeletal:        General: No deformity.       Arms:     Cervical back: Full passive range of motion without pain. No rigidity.     Comments: Sling right  Skin:    General: Skin is warm and dry.     Coloration: Skin is not cyanotic, jaundiced or pale.  Neurological:     Mental Status: He is alert and oriented to person, place, and time.     GCS: GCS eye subscore is 4. GCS verbal subscore is 5. GCS motor subscore is 6.     Gait: Gait is  intact.  Psychiatric:        Speech: Speech normal.        Behavior: Behavior normal. Behavior is cooperative.     ED Results and Treatments Labs (all labs ordered are listed, but only abnormal results are displayed) Labs Reviewed - No data to display                                                                                                                        Radiology CT Head Wo Contrast Result Date: 12/26/2023 CLINICAL DATA:  Head trauma, intracranial venous injury suspected; Neck trauma, intoxicated or obtunded (Age >= 16y) EXAM: CT HEAD WITHOUT CONTRAST CT CERVICAL SPINE WITHOUT CONTRAST TECHNIQUE: Multidetector CT imaging of the head and cervical spine was performed following the standard protocol without intravenous contrast. Multiplanar CT image reconstructions of the cervical spine were also generated. RADIATION DOSE REDUCTION: This exam was performed according to the departmental dose-optimization program which includes automated exposure control, adjustment of the mA and/or kV according to patient size and/or use of iterative reconstruction technique. COMPARISON:  CT head and CT cervical spine September 08, 2023. FINDINGS: CT HEAD FINDINGS Brain: No evidence of acute infarction, hemorrhage, hydrocephalus, extra-axial collection or mass lesion/mass effect. Vascular: No hyperdense vessel. Skull: No acute fracture. Sinuses/Orbits: Clear sinuses.  No acute orbital findings. Other: No mastoid effusions. CT CERVICAL SPINE FINDINGS Alignment: Similar alignment in comparison to the prior. No traumatic malalignment. Skull base and vertebrae: Vertebral body heights are unchanged. No evidence of acute fracture. Soft tissues and spinal canal: No prevertebral fluid or swelling. No visible canal hematoma. Disc levels: Similar multilevel degenerative change with facet arthropathy greatest at C7-T1. Upper chest: Visualized lung apices are clear. IMPRESSION: No acute intracranial or cervical spine  finding. Electronically Signed   By: Gilmore GORMAN Molt M.D.   On: 12/26/2023 00:39   CT Cervical Spine Wo Contrast Result Date: 12/26/2023 CLINICAL DATA:  Head trauma, intracranial venous injury suspected; Neck trauma, intoxicated or obtunded (Age >= 16y) EXAM: CT HEAD WITHOUT CONTRAST CT CERVICAL SPINE WITHOUT CONTRAST TECHNIQUE: Multidetector CT imaging of the head and cervical spine was performed following the standard protocol without intravenous contrast. Multiplanar CT image reconstructions of the cervical spine were also generated. RADIATION DOSE REDUCTION: This exam was performed according to the departmental dose-optimization program which includes automated exposure control, adjustment  of the mA and/or kV according to patient size and/or use of iterative reconstruction technique. COMPARISON:  CT head and CT cervical spine September 08, 2023. FINDINGS: CT HEAD FINDINGS Brain: No evidence of acute infarction, hemorrhage, hydrocephalus, extra-axial collection or mass lesion/mass effect. Vascular: No hyperdense vessel. Skull: No acute fracture. Sinuses/Orbits: Clear sinuses.  No acute orbital findings. Other: No mastoid effusions. CT CERVICAL SPINE FINDINGS Alignment: Similar alignment in comparison to the prior. No traumatic malalignment. Skull base and vertebrae: Vertebral body heights are unchanged. No evidence of acute fracture. Soft tissues and spinal canal: No prevertebral fluid or swelling. No visible canal hematoma. Disc levels: Similar multilevel degenerative change with facet arthropathy greatest at C7-T1. Upper chest: Visualized lung apices are clear. IMPRESSION: No acute intracranial or cervical spine finding. Electronically Signed   By: Gilmore GORMAN Molt M.D.   On: 12/26/2023 00:39   DG Clavicle Right Result Date: 12/25/2023 CLINICAL DATA:  Right shoulder and clavicle pain after fall. EXAM: RIGHT CLAVICLE - 2+ VIEWS COMPARISON:  None are available FINDINGS: Acute mildly displaced fracture of the  distal right clavicle. There is 6 mm of superior displacement of the distal fragment. The fracture is 1.3 cm from the [facility] joint. No evidence of extension into the [facility] joint. IMPRESSION: 1. Acute mildly displaced fracture of the distal right clavicle. Electronically Signed   By: Norman Gatlin M.D.   On: 12/25/2023 23:54    Pertinent labs & imaging results that were available during my care of the patient were reviewed by me and considered in my medical decision making (see MDM for details).  Medications Ordered in ED Medications  cyclobenzaprine  (FLEXERIL ) tablet 5 mg (has no administration in time range)                                                                                                                                     Procedures Procedures  (including critical care time)  Medical Decision Making / ED Course    Medical Decision Making:    Martin Anthony is a 58 y.o. male  with past medical history as below, significant for alcohol  abuse, alcoholic cirrhosis, hyperlipidemia, hypertension, colon cancer with ostomy who presents to the ED with complaint of right shoulder pain. The complaint involves an extensive differential diagnosis and also carries with it a high risk of complications and morbidity.  Serious etiology was considered.  Complete initial physical exam performed, notably the patient was in no distress, resting on stretcher.    Reviewed and confirmed nursing documentation for past medical history, family history, social history.  Vital signs reviewed.    Patient was seen last night and evaluated for his follow-up, he has right clavicle injury.  He was given a sling. Will provide dose of Flexeril  here, his allergies noted to NSAIDs and acetaminophen .  Will avoid narcotics given history of substance abuse Given food and drink which he was able to tolerate  without difficulty. Feeling better Encouraged follow-up with orthopedics regarding clavicle fracture Right  concussion precautions  Patient in no distress and overall condition is stable. Detailed discussions were had with the patient/guardian regarding current findings, and need for close f/u with PCP or on call doctor. The patient/guardian has been instructed to return immediately if the symptoms worsen in any way for re-evaluation. Patient/guardian verbalized understanding and is in agreement with current care plan. All questions answered prior to discharge.                    Additional history obtained: -Additional history obtained from na -External records from outside source obtained and reviewed including: Chart review including previous notes, labs, imaging, consultation notes including  Recent er visit Prior imaging   Lab Tests: na  EKG   EKG Interpretation Date/Time:    Ventricular Rate:    PR Interval:    QRS Duration:    QT Interval:    QTC Calculation:   R Axis:      Text Interpretation:           Imaging Studies ordered: na   Medicines ordered and prescription drug management: Meds ordered this encounter  Medications   cyclobenzaprine  (FLEXERIL ) tablet 5 mg   cyclobenzaprine  (FLEXERIL ) 10 MG tablet    Sig: Take 1 tablet (10 mg total) by mouth 2 (two) times daily as needed for muscle spasms.    Dispense:  20 tablet    Refill:  0    -I have reviewed the patients home medicines and have made adjustments as needed   Consultations Obtained: na   Cardiac Monitoring: Continuous pulse oximetry interpreted by myself, 100% on ra.    Social Determinants of Health:  Diagnosis or treatment significantly limited by social determinants of health: former smoker and alcohol  use Advised to stop drinking etoh   Reevaluation: After the interventions noted above, I reevaluated the patient and found that they have improved  Co morbidities that complicate the patient evaluation  Past Medical History:  Diagnosis Date   Anxiety    Ascites due to  alcoholic cirrhosis (HCC)    Cirrhosis with alcoholism (HCC)    Depression    Diabetes mellitus without complication (HCC)    GERD (gastroesophageal reflux disease)    Gout    Hyperlipidemia    Hypertension    Pancreatitis    Primary adenocarcinoma of ascending colon (HCC)    Substance abuse (HCC)    alcoholism    Type 2 diabetes mellitus with diabetic neuropathy (HCC)       Dispostion: Disposition decision including need for hospitalization was considered, and patient discharged from emergency department.    Final Clinical Impression(s) / ED Diagnoses Final diagnoses:  Alcohol  abuse  Acute pain of right shoulder        Elnor Jayson LABOR, DO 12/26/23 9171

## 2023-12-26 NOTE — ED Triage Notes (Addendum)
 Pt returns to ED after being seen last night for falling and fx R clavicle and ETOH intoxication. Pt's ex-wife called 911 as pt had called her when he awoke from sleeping in his car, stated he was bleeding. Was assessed out in parking lot and no active bleeding seen, pt still desires to check in, does not state why. States he is looking for his dog which was taken by [employer] last night

## 2023-12-26 NOTE — ED Notes (Signed)
 [employer] has spoken to pt and pt has not given any information for a contact to come get the dog. [employer] has now called [employer] and they are on the way to get the dog.

## 2023-12-26 NOTE — ED Notes (Signed)
 This RN heard a knock coming from pt's direction, went to assess pt and he was on the floor lying on his back. Pt states he didn't know how he got there, all side rails up on bed and it appears he scooted to the edge of the bed and got up. Denies any new pain, continues to c/o pain to R shoulder.   Dr. Trine notified, pt able to stand and use bathroom by himself. Sling then applied. Pt then moved to hallway where visible by staff. Continues to shout and argue with staff.

## 2023-12-26 NOTE — Discharge Instructions (Addendum)
 It was a pleasure caring for you today in the emergency department.  Please call orthopedic surgery to arrange follow-up regarding your broken collarbone/clavicle  Strongly recommend that you stop drinking alcohol   Based on the events which brought you to the ER today, it is possible that you may have a concussion. A concussion occurs when there is a blow to the head or body, with enough force to shake the brain and disrupt how the brain functions. You may experience symptoms such as headaches, sensitivity to light/noise, dizziness, cognitive slowing, difficulty concentrating / remembering, trouble sleeping and drowsiness. These symptoms may last anywhere from hours/days to potentially weeks/months. While these symptoms are very frustrating and perhaps debilitating, it is important that you remember that they will improve over time. Everyone has a different rate of recovery; it is difficult to predict when your symptoms will resolve. In order to allow for your brain to heal after the injury, we recommend that you see your primary physician or a physician knowledgeable in concussion management. We also advise you to let your body and brain rest: avoid physical activities (sports, gym, and exercise) and reduce cognitive demands (reading, texting, TV watching, computer use, video games, etc). School attendance, after-school activities and work may need to be modified to avoid increasing symptoms. We recommend against driving until until all symptoms have resolved. Come back to the ER right away if you are having repeated episodes of vomiting, severe/worsening headache/dizziness or any other symptom that alarms you. We recommended that someone stay with you for the next 24 hours to monitor for these worrisome symptoms.        RESOURCE GUIDE  Chronic Pain Problems: Contact [facility]  [phone] Patients need to be referred by their primary care doctor.  Insufficient Money for  Medicine: Contact [employer]:  call [phone]  No Primary Care Doctor: Call [facility]  [phone] - can help you locate a primary care doctor that  accepts your insurance, provides certain services, etc. Physician Referral Service- [phone]  Agencies that provide inexpensive medical care: [facility]  [phone] [facility]  [phone] [facility]  [phone] [facility]  [phone] [facility]  [phone] [facility]  [phone]  Medicaid-accepting [facility] Providers: [facility]- [address], [address]  [phone], Mon-Fri 9am-7pm, Sat 9am-1pm [facility]- [address] [place], [address] [place]  [phone] [facility]- [address] [place]  [phone] [facility]- [address]  [phone] [facility]- [address] [place], [address], [phone]  Only accepts [place] patients after they have their name  applied to their card  Self Pay (no insurance) in [facility]: Sickle Cell Patients - [facility]  [address] [place], [phone] [facility]- [address] [place]  [phone]       [place] [facility] [place]- [address]       -     [facility]- see information above (Speak to [employer] if you do not have insurance)       -  [facility]- [address],  [phone]       -  [facility]- [address], [phone]       -  [facility]-  [address], [place], [phone]       -  [facility]gent [facility] and [facility] - [address] [phone]       -  [facility]- [address] [phone], also [address], [phone]       -     [facility]- [address] [place], [phone], 1st & 3rd Saturday         every month, 10am-1pm  -     [facility]   [address] [place], [place].   Phone:  [phone], Fax:  [phone]. Hours of Operation:  9 am - 6 pm, M-F.  -     [facility]   [address], [place]   Phone: [phone], Fax: [phone]. Hours of Operation:  8:30 am - 5:30 pm, M-F.    Dental Assistance If unable to pay or uninsured, contact:  [facility]. to become qualified for the adult dental clinic.  Patients with Medicaid: [facility] [phone] [address], [phone] [address], [phone]  If unable to pay, or uninsured, contact [facility] [phone] in [place], [phone] in [facility]) to become qualified for the adult dental clinic  [facility] [address] [place], [place] [address] [phone] [email or web address]  Other Proofreader Services: [facility]- [address] [place], [place], [place], [address], [phone], Ext. [phone], 2nd and 4th Thursday of the month at 6:30am.  10 clients each day by appointment, can sometimes see walk-in patients if someone does not show for an appointment. [facility]- [address] [place], [place], [address], [phone] [facility] [address] [place], [place], [address], [phone] [facility] Health Department- [phone] [facility] Health Department- [phone] [facility] Main [phone]

## 2023-12-26 NOTE — ED Notes (Signed)
 Pt ambulated steadily in hallway about 51ft, observed by 2 staff members. Dr. Trine notified and pt then discharged. Rolled outside in wheelchair, pt left without d/c papers

## 2024-01-24 ENCOUNTER — Encounter: Payer: Self-pay | Admitting: Family Medicine

## 2024-01-24 DIAGNOSIS — S42031K Displaced fracture of lateral end of right clavicle, subsequent encounter for fracture with nonunion: Secondary | ICD-10-CM

## 2024-01-24 NOTE — Telephone Encounter (Signed)
I did the referral to Orthopedics  

## 2024-01-31 ENCOUNTER — Other Ambulatory Visit: Payer: Self-pay

## 2024-01-31 ENCOUNTER — Other Ambulatory Visit: Payer: Self-pay | Admitting: Family Medicine

## 2024-01-31 DIAGNOSIS — F418 Other specified anxiety disorders: Secondary | ICD-10-CM

## 2024-01-31 MED ORDER — METFORMIN HCL ER (OSM) 500 MG PO TB24: 500.0000 mg | ORAL_TABLET | Freq: Every day | ORAL | 3 refills | Status: AC

## 2024-01-31 MED ORDER — SPIRONOLACTONE 100 MG PO TABS: 100.0000 mg | ORAL_TABLET | Freq: Every day | ORAL | 3 refills | Status: AC

## 2024-01-31 MED ORDER — SERTRALINE HCL 50 MG PO TABS
50.0000 mg | ORAL_TABLET | Freq: Every day | ORAL | 3 refills | Status: AC
Start: 2024-01-31 — End: ?

## 2024-01-31 NOTE — Telephone Encounter (Signed)
 Pt requests for refills sent to pt pharmacy is Metformin /Sertraline /Spironolactone , some Rx were prescribed at the hospital for short term,please advise

## 2024-02-01 ENCOUNTER — Other Ambulatory Visit (HOSPITAL_COMMUNITY): Payer: Self-pay

## 2024-02-01 ENCOUNTER — Telehealth: Payer: Self-pay

## 2024-02-01 NOTE — Telephone Encounter (Signed)
 Pharmacy Patient Advocate Encounter   Received notification from CoverMyMeds that prior authorization for metFORMIN  HCl ER (OSM) 500MG  er tablets is required/requested.   Insurance verification completed.   The patient is insured through [facility] .   Per test claim: PA required; PA submitted to above mentioned insurance via Latent Key/confirmation #/EOC [id number] Status is pending

## 2024-02-01 NOTE — Telephone Encounter (Signed)
I sent these in yesterday  

## 2024-02-01 NOTE — Telephone Encounter (Signed)
 Pharmacy Patient Advocate Encounter  Received notification from [facility] that Prior Authorization for metFORMIN  HCl ER (OSM) 500MG  er tablets has been APPROVED from 02/01/24 to 06/14/2098   PA #/Case ID/Reference #: [id number]

## 2024-02-02 ENCOUNTER — Other Ambulatory Visit (HOSPITAL_COMMUNITY): Payer: Self-pay

## 2024-05-06 ENCOUNTER — Other Ambulatory Visit: Payer: Self-pay

## 2024-05-06 ENCOUNTER — Emergency Department (HOSPITAL_COMMUNITY)
Admission: EM | Admit: 2024-05-06 | Discharge: 2024-05-07 | Disposition: A | Discharge status: Home / Self Care | Payer: Self-pay | Attending: Emergency Medicine | Admitting: Emergency Medicine

## 2024-05-06 ENCOUNTER — Encounter (HOSPITAL_COMMUNITY): Payer: Self-pay | Admitting: Emergency Medicine

## 2024-05-06 DIAGNOSIS — R109 Unspecified abdominal pain: Secondary | ICD-10-CM | POA: Insufficient documentation

## 2024-05-06 DIAGNOSIS — I129 Hypertensive chronic kidney disease with stage 1 through stage 4 chronic kidney disease, or unspecified chronic kidney disease: Secondary | ICD-10-CM | POA: Insufficient documentation

## 2024-05-06 DIAGNOSIS — W19XXXA Unspecified fall, initial encounter: Secondary | ICD-10-CM | POA: Insufficient documentation

## 2024-05-06 DIAGNOSIS — S60512A Abrasion of left hand, initial encounter: Secondary | ICD-10-CM | POA: Insufficient documentation

## 2024-05-06 DIAGNOSIS — R55 Syncope and collapse: Secondary | ICD-10-CM | POA: Insufficient documentation

## 2024-05-06 DIAGNOSIS — E1122 Type 2 diabetes mellitus with diabetic chronic kidney disease: Secondary | ICD-10-CM | POA: Insufficient documentation

## 2024-05-06 DIAGNOSIS — Z794 Long term (current) use of insulin: Secondary | ICD-10-CM | POA: Insufficient documentation

## 2024-05-06 DIAGNOSIS — Z79899 Other long term (current) drug therapy: Secondary | ICD-10-CM | POA: Insufficient documentation

## 2024-05-06 DIAGNOSIS — Z85028 Personal history of other malignant neoplasm of stomach: Secondary | ICD-10-CM | POA: Insufficient documentation

## 2024-05-06 DIAGNOSIS — N183 Chronic kidney disease, stage 3 unspecified: Secondary | ICD-10-CM | POA: Insufficient documentation

## 2024-05-06 LAB — CBC
HCT: 28.5 % — ABNORMAL LOW (ref 39.0–52.0)
Hemoglobin: 9.7 g/dL — ABNORMAL LOW (ref 13.0–17.0)
MCH: 35 pg — ABNORMAL HIGH (ref 26.0–34.0)
MCHC: 34 g/dL (ref 30.0–36.0)
MCV: 102.9 fL — ABNORMAL HIGH (ref 80.0–100.0)
Platelets: 74 K/uL — ABNORMAL LOW (ref 150–400)
RBC: 2.77 MIL/uL — ABNORMAL LOW (ref 4.22–5.81)
RDW: 13.4 % (ref 11.5–15.5)
WBC: 3.7 K/uL — ABNORMAL LOW (ref 4.0–10.5)
nRBC: 0 % (ref 0.0–0.2)

## 2024-05-06 LAB — COMPREHENSIVE METABOLIC PANEL WITH GFR
ALT: 26 U/L (ref 0–44)
AST: 77 U/L — ABNORMAL HIGH (ref 15–41)
Albumin: 3.6 g/dL (ref 3.5–5.0)
Alkaline Phosphatase: 154 U/L — ABNORMAL HIGH (ref 38–126)
Anion gap: 10 (ref 5–15)
BUN: 30 mg/dL — ABNORMAL HIGH (ref 6–20)
CO2: 20 mmol/L — ABNORMAL LOW (ref 22–32)
Calcium: 9.2 mg/dL (ref 8.9–10.3)
Chloride: 105 mmol/L (ref 98–111)
Creatinine, Ser: 2.21 mg/dL — ABNORMAL HIGH (ref 0.61–1.24)
GFR, Estimated: 34 mL/min — ABNORMAL LOW (ref >60)
Glucose, Bld: 171 mg/dL — ABNORMAL HIGH (ref 70–99)
Potassium: 3.9 mmol/L (ref 3.5–5.1)
Sodium: 135 mmol/L (ref 135–145)
Total Bilirubin: 2.6 mg/dL — ABNORMAL HIGH (ref 0.0–1.2)
Total Protein: 7.3 g/dL (ref 6.5–8.1)

## 2024-05-06 LAB — ETHANOL: Alcohol, Ethyl (B): 268 mg/dL — ABNORMAL HIGH (ref <15)

## 2024-05-06 LAB — CBG MONITORING, ED: Glucose-Capillary: 156 mg/dL — ABNORMAL HIGH (ref 70–99)

## 2024-05-06 MED ORDER — SODIUM CHLORIDE 0.9 % IV BOLUS
1000.0000 mL | Freq: Once | INTRAVENOUS | Status: AC
  Administered 2024-05-07: 1000 mL via INTRAVENOUS

## 2024-05-06 MED ORDER — PANTOPRAZOLE SODIUM 40 MG IV SOLR
40.0000 mg | Freq: Once | INTRAVENOUS | Status: AC
  Administered 2024-05-07: 40 mg via INTRAVENOUS
  Filled 2024-05-06: qty 10
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
## 2024-05-06 NOTE — ED Triage Notes (Signed)
 Pt BIBA for syncopal episode, pt reports he did fall, denies LOC or hitting his head. Hx of encephalopathy. Has colostomy, pt noted red and black streaks today in colostomy bag. Pt reports having 3-4 beers today per EMS.  PTA 18g L AC CBG 161
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
## 2024-05-07 ENCOUNTER — Emergency Department (HOSPITAL_COMMUNITY): Payer: Self-pay

## 2024-05-07 LAB — URINALYSIS, ROUTINE W REFLEX MICROSCOPIC
Bilirubin Urine: NEGATIVE
Glucose, UA: NEGATIVE mg/dL
Ketones, ur: NEGATIVE mg/dL
Leukocytes,Ua: NEGATIVE
Nitrite: NEGATIVE
Protein, ur: NEGATIVE mg/dL
Specific Gravity, Urine: 1.003 — ABNORMAL LOW (ref 1.005–1.030)
pH: 5 (ref 5.0–8.0)

## 2024-05-07 NOTE — Discharge Instructions (Signed)
 It was a pleasure taking part in your care.  As we discussed, workup here is reassuring.  Please follow-up with your PCP for further care and management.  Please continue taking all medications as prescribed.  Please return to the ED with new symptoms.
